# Patient Record
Sex: Female | Born: 1940 | Race: White | Hispanic: No | Smoking: Never smoker
Health system: Southern US, Community
[De-identification: ages and names within clinical notes are randomized; demographics above are authoritative.]

## PROBLEM LIST (undated history)

## (undated) ENCOUNTER — Emergency Department: Admission: EM | Payer: Medicare Other | Source: Home / Self Care

## (undated) DIAGNOSIS — M1990 Unspecified osteoarthritis, unspecified site: Secondary | ICD-10-CM

## (undated) DIAGNOSIS — R001 Bradycardia, unspecified: Secondary | ICD-10-CM

## (undated) DIAGNOSIS — I2510 Atherosclerotic heart disease of native coronary artery without angina pectoris: Secondary | ICD-10-CM

## (undated) DIAGNOSIS — Z972 Presence of dental prosthetic device (complete) (partial): Secondary | ICD-10-CM

## (undated) DIAGNOSIS — D649 Anemia, unspecified: Secondary | ICD-10-CM

## (undated) DIAGNOSIS — G20 Parkinson's disease: Secondary | ICD-10-CM

## (undated) DIAGNOSIS — N200 Calculus of kidney: Secondary | ICD-10-CM

## (undated) DIAGNOSIS — G20A1 Parkinson's disease without dyskinesia, without mention of fluctuations: Secondary | ICD-10-CM

## (undated) DIAGNOSIS — Z8719 Personal history of other diseases of the digestive system: Secondary | ICD-10-CM

## (undated) DIAGNOSIS — Z87442 Personal history of urinary calculi: Secondary | ICD-10-CM

## (undated) DIAGNOSIS — I700 Atherosclerosis of aorta: Secondary | ICD-10-CM

## (undated) DIAGNOSIS — I429 Cardiomyopathy, unspecified: Secondary | ICD-10-CM

## (undated) DIAGNOSIS — K5790 Diverticulosis of intestine, part unspecified, without perforation or abscess without bleeding: Secondary | ICD-10-CM

## (undated) DIAGNOSIS — K8020 Calculus of gallbladder without cholecystitis without obstruction: Secondary | ICD-10-CM

## (undated) DIAGNOSIS — K08109 Complete loss of teeth, unspecified cause, unspecified class: Secondary | ICD-10-CM

## (undated) DIAGNOSIS — Z889 Allergy status to unspecified drugs, medicaments and biological substances status: Secondary | ICD-10-CM

## (undated) DIAGNOSIS — I10 Essential (primary) hypertension: Secondary | ICD-10-CM

## (undated) DIAGNOSIS — Z9109 Other allergy status, other than to drugs and biological substances: Secondary | ICD-10-CM

## (undated) DIAGNOSIS — Z8739 Personal history of other diseases of the musculoskeletal system and connective tissue: Secondary | ICD-10-CM

## (undated) DIAGNOSIS — M40204 Unspecified kyphosis, thoracic region: Secondary | ICD-10-CM

## (undated) DIAGNOSIS — M5135 Other intervertebral disc degeneration, thoracolumbar region: Secondary | ICD-10-CM

## (undated) DIAGNOSIS — R7303 Prediabetes: Secondary | ICD-10-CM

## (undated) DIAGNOSIS — M810 Age-related osteoporosis without current pathological fracture: Secondary | ICD-10-CM

## (undated) DIAGNOSIS — I519 Heart disease, unspecified: Secondary | ICD-10-CM

## (undated) DIAGNOSIS — Z87898 Personal history of other specified conditions: Secondary | ICD-10-CM

## (undated) DIAGNOSIS — R0789 Other chest pain: Secondary | ICD-10-CM

## (undated) DIAGNOSIS — K219 Gastro-esophageal reflux disease without esophagitis: Secondary | ICD-10-CM

## (undated) DIAGNOSIS — I517 Cardiomegaly: Secondary | ICD-10-CM

## (undated) DIAGNOSIS — N182 Chronic kidney disease, stage 2 (mild): Secondary | ICD-10-CM

## (undated) DIAGNOSIS — I493 Ventricular premature depolarization: Secondary | ICD-10-CM

## (undated) DIAGNOSIS — I999 Unspecified disorder of circulatory system: Secondary | ICD-10-CM

## (undated) DIAGNOSIS — M419 Scoliosis, unspecified: Secondary | ICD-10-CM

## (undated) DIAGNOSIS — E785 Hyperlipidemia, unspecified: Secondary | ICD-10-CM

## (undated) DIAGNOSIS — R42 Dizziness and giddiness: Secondary | ICD-10-CM

## (undated) HISTORY — PX: TUBAL LIGATION: SHX77

## (undated) HISTORY — DX: Age-related osteoporosis without current pathological fracture: M81.0

## (undated) HISTORY — DX: Ventricular premature depolarization: I49.3

## (undated) HISTORY — DX: Hyperlipidemia, unspecified: E78.5

## (undated) HISTORY — DX: Cardiomyopathy, unspecified: I42.9

## (undated) HISTORY — PX: CORONARY ANGIOPLASTY WITH STENT PLACEMENT: SHX49

## (undated) HISTORY — DX: Personal history of other diseases of the musculoskeletal system and connective tissue: Z87.39

## (undated) HISTORY — DX: Atherosclerotic heart disease of native coronary artery without angina pectoris: I25.10

## (undated) HISTORY — DX: Parkinson's disease: G20

## (undated) HISTORY — DX: Other allergy status, other than to drugs and biological substances: Z91.09

## (undated) HISTORY — DX: Bradycardia, unspecified: R00.1

## (undated) HISTORY — PX: APPENDECTOMY: SHX54

## (undated) HISTORY — PX: VARICOSE VEIN SURGERY: SHX832

## (undated) HISTORY — DX: Calculus of kidney: N20.0

## (undated) HISTORY — DX: Other chest pain: R07.89

## (undated) HISTORY — PX: OTHER SURGICAL HISTORY: SHX169

## (undated) HISTORY — DX: Dizziness and giddiness: R42

---

## 1982-01-21 HISTORY — PX: BREAST EXCISIONAL BIOPSY: SUR124

## 1993-01-21 HISTORY — PX: LITHOTRIPSY: SUR834

## 1995-01-22 HISTORY — PX: BREAST EXCISIONAL BIOPSY: SUR124

## 2004-05-03 ENCOUNTER — Ambulatory Visit: Payer: Self-pay | Admitting: Internal Medicine

## 2005-06-04 ENCOUNTER — Ambulatory Visit: Payer: Self-pay | Admitting: Internal Medicine

## 2006-06-06 ENCOUNTER — Ambulatory Visit: Payer: Self-pay | Admitting: Internal Medicine

## 2006-07-08 ENCOUNTER — Ambulatory Visit: Payer: Self-pay | Admitting: Gastroenterology

## 2007-06-08 ENCOUNTER — Ambulatory Visit: Payer: Self-pay | Admitting: Internal Medicine

## 2008-02-29 ENCOUNTER — Encounter: Payer: Self-pay | Admitting: Physician Assistant

## 2008-03-21 ENCOUNTER — Encounter: Payer: Self-pay | Admitting: Physician Assistant

## 2008-06-13 ENCOUNTER — Ambulatory Visit: Payer: Self-pay | Admitting: Internal Medicine

## 2009-07-19 ENCOUNTER — Ambulatory Visit: Payer: Self-pay | Admitting: Internal Medicine

## 2010-08-03 ENCOUNTER — Ambulatory Visit: Payer: Self-pay | Admitting: Internal Medicine

## 2011-03-13 ENCOUNTER — Ambulatory Visit: Payer: Self-pay

## 2011-08-05 ENCOUNTER — Ambulatory Visit: Payer: Self-pay | Admitting: Internal Medicine

## 2011-08-14 ENCOUNTER — Ambulatory Visit: Payer: Self-pay | Admitting: Internal Medicine

## 2012-03-10 ENCOUNTER — Ambulatory Visit: Payer: Self-pay | Admitting: Cardiology

## 2012-03-10 DIAGNOSIS — I2510 Atherosclerotic heart disease of native coronary artery without angina pectoris: Secondary | ICD-10-CM

## 2012-03-10 HISTORY — PX: LEFT HEART CATH AND CORONARY ANGIOGRAPHY: CATH118249

## 2012-03-10 HISTORY — DX: Atherosclerotic heart disease of native coronary artery without angina pectoris: I25.10

## 2012-08-06 ENCOUNTER — Ambulatory Visit: Payer: Self-pay | Admitting: Internal Medicine

## 2013-06-21 DIAGNOSIS — I10 Essential (primary) hypertension: Secondary | ICD-10-CM | POA: Insufficient documentation

## 2013-06-21 DIAGNOSIS — E782 Mixed hyperlipidemia: Secondary | ICD-10-CM | POA: Insufficient documentation

## 2013-06-24 DIAGNOSIS — I2510 Atherosclerotic heart disease of native coronary artery without angina pectoris: Secondary | ICD-10-CM | POA: Insufficient documentation

## 2013-06-24 HISTORY — DX: Atherosclerotic heart disease of native coronary artery without angina pectoris: I25.10

## 2013-06-25 DIAGNOSIS — K219 Gastro-esophageal reflux disease without esophagitis: Secondary | ICD-10-CM | POA: Insufficient documentation

## 2013-06-25 HISTORY — PX: CORONARY ANGIOPLASTY WITH STENT PLACEMENT: SHX49

## 2013-06-25 HISTORY — DX: Gastro-esophageal reflux disease without esophagitis: K21.9

## 2013-08-02 DIAGNOSIS — N182 Chronic kidney disease, stage 2 (mild): Secondary | ICD-10-CM | POA: Insufficient documentation

## 2013-08-16 ENCOUNTER — Ambulatory Visit: Payer: Self-pay | Admitting: Internal Medicine

## 2013-08-31 ENCOUNTER — Encounter: Payer: Self-pay | Admitting: Cardiovascular Disease

## 2013-09-06 ENCOUNTER — Ambulatory Visit: Payer: Self-pay | Admitting: Internal Medicine

## 2013-09-21 ENCOUNTER — Encounter: Payer: Self-pay | Admitting: Cardiovascular Disease

## 2013-10-21 ENCOUNTER — Encounter: Payer: Self-pay | Admitting: Cardiovascular Disease

## 2014-08-16 ENCOUNTER — Ambulatory Visit
Admission: RE | Admit: 2014-08-16 | Discharge: 2014-08-16 | Disposition: A | Discharge status: Home / Self Care | Payer: Medicare Other | Source: Ambulatory Visit | Attending: Internal Medicine | Admitting: Internal Medicine

## 2014-08-16 ENCOUNTER — Other Ambulatory Visit: Payer: Self-pay | Admitting: Physician Assistant

## 2014-08-16 ENCOUNTER — Ambulatory Visit
Admission: EM | Admit: 2014-08-16 | Discharge: 2014-08-16 | Payer: Medicare Other | Attending: Internal Medicine | Admitting: Internal Medicine

## 2014-08-16 DIAGNOSIS — M79605 Pain in left leg: Secondary | ICD-10-CM | POA: Diagnosis not present

## 2014-08-16 DIAGNOSIS — M79662 Pain in left lower leg: Secondary | ICD-10-CM | POA: Diagnosis not present

## 2014-08-16 HISTORY — DX: Essential (primary) hypertension: I10

## 2014-08-16 HISTORY — DX: Heart disease, unspecified: I51.9

## 2014-08-16 HISTORY — DX: Unspecified disorder of circulatory system: I99.9

## 2014-08-16 NOTE — ED Notes (Signed)
Left leg since Thursday. Pt denies swelling. States the pain started in the calf, and radiates to the popliteal. Pt has tried Naproxen Sodium, without relief. Pt has a h/o vein surgeries in both legs.

## 2014-08-16 NOTE — ED Provider Notes (Signed)
CSN: [id number]     Arrival date & time 08/16/14  1843 History   First MD Initiated Contact with Patient 08/16/14 1947     Chief Complaint  Patient presents with  . Leg Pain   (Consider location/radiation/quality/duration/timing/severity/associated sxs/prior Treatment) HPI  74 yo F presents with her daughter concerned about  left calf tenderness increasing over the past 5 days . Patient  is normally quite active- does exercise class 2 times a week and often walk 2 miles a day on the in between days. Over the past 5 days she has had to interrupt her activities- abbreviated because of the discomfort. She has tried muscle rubs without relief. 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 She has been afebrile and without malaise. She takes Plavix and ASA daily Past Medical History  Diagnosis Date  . Hypertension   . Heart disease   . Vascular disease   . Coronary artery disease    Past Surgical History  Procedure Laterality Date  . Appendectomy    . Varicose vein surgery    . Breast lumpectomy  [id number]   History reviewed. No pertinent family history. History  Substance Use Topics  . Smoking status: Never Smoker   . Smokeless tobacco: Never Used  . Alcohol Use: No   OB History    No data available     Review of Systems Constitutional -afebrile Eyes-denies visual changes ENT- normal voice,denies sore throat CV-denies chest pain Resp-denies SOB GI- negative for nausea,vomiting, diarrhea GU- negative for dysuria MSK- negative for back pain, ambulatory with left calf discomfort recently Skin- denies acute changes, no erythema , heat  Neuro- negative headache,focal weakness or numbness   Allergies  Penicillins and Sulfa antibiotics  Home Medications   Prior to Admission medications   Medication Sig Start Date End Date Taking? Authorizing Provider  aspirin 81 MG tablet Take 81 mg by mouth daily.   Yes Historical Provider, MD  atorvastatin  (LIPITOR) 10 MG tablet Take 10 mg by mouth daily.   Yes Historical Provider, MD  CALCIUM CARBONATE-VITAMIN D PO Take by mouth.   Yes Historical Provider, MD  carvedilol (COREG) 6.25 MG tablet Take 6.25 mg by mouth 2 (two) times daily with a meal.   Yes Historical Provider, MD  clopidogrel (PLAVIX) 75 MG tablet Take 75 mg by mouth daily.   Yes Historical Provider, MD  fluticasone (FLONASE) 50 MCG/ACT nasal spray Place into both nostrils daily.   Yes Historical Provider, MD  Multiple Minerals (CALCIUM/MAGNESIUM/ZINC PO) Take by mouth.   Yes Historical Provider, MD  Multiple Vitamins-Minerals (MULTIVITAMIN ADULT) TABS Take by mouth.   Yes Historical Provider, MD  omega-3 acid ethyl esters (LOVAZA) 1 G capsule Take by mouth 2 (two) times daily.   Yes Historical Provider, MD  omeprazole (PRILOSEC) 20 MG capsule Take 20 mg by mouth daily.   Yes Historical Provider, MD  sertraline (ZOLOFT) 25 MG tablet Take 25 mg by mouth daily.   Yes Historical Provider, MD   BP 186/69 mmHg  Pulse 60  Temp(Src) 98.1 F (36.7 C) (Oral)  Resp 16  Ht 4\' 11"  (1.499 m)  Wt 152 lb (68.947 kg)  BMI 30.68 kg/m2  SpO2 97% Physical Exam   Constitutional -alert and oriented,well appearing and in no acute distress; expresses concern about leg Head-atraumatic, normocephalic Eyes- conjunctiva normal, EOMI ,conjugate gaze Nose- no congestion  Mouth/throat- mucous membranes moist , Neck- supple CV- regular rate, grossly normal heart sounds,  Resp-no distress, normal  respiratory effort,clear to auscultation bilaterally GI/GU- not examined MSK- Both extremities are warm, strong dorsal pedal pulses and equal 2 + DTRS; mild tenderness left calf with point tenderness posterior, squeeze positive for discomfort but not intense pain; left calf measures 15 1/2 at 5 " below prominence, Right calf measures larger at 16".  normal ROM, ambulatory, though presents in wheelchair for convenience of daughter while at [facility]. Accomplishes full  self care at home. Neuro- normal speech and language, no gross focal neurological deficit appreciated, no gait instability, Skin-warm,dry ,intact; no rash noted Psych-mood and affect grossly normal; speech and behavior grossly normal ED Course  Procedures (including critical care time) Labs Review Labs Reviewed - No data to display  Imaging Review US Venous Img Lower Unilateral Left  08/16/2014   CLINICAL DATA:  Left leg pain.  Progressive left calf tenderness.  EXAM: LEFT LOWER EXTREMITY VENOUS DOPPLER ULTRASOUND  TECHNIQUE: Gray-scale sonography with graded compression, as well as color Doppler and duplex ultrasound were performed to evaluate the lower extremity deep venous systems from the level of the common femoral vein and including the common femoral, femoral, profunda femoral, popliteal and calf veins including the posterior tibial, peroneal and gastrocnemius veins when visible. The superficial great saphenous vein was also interrogated. Spectral Doppler was utilized to evaluate flow at rest and with distal augmentation maneuvers in the common femoral, femoral and popliteal veins.  COMPARISON:  None.  FINDINGS: Contralateral Common Femoral Vein: Respiratory phasicity is normal and symmetric with the symptomatic side. No evidence of thrombus. Normal compressibility.  Common Femoral Vein: No evidence of thrombus. Normal compressibility, respiratory phasicity and response to augmentation.  Saphenofemoral Junction: No evidence of thrombus. Normal compressibility and flow on color Doppler imaging.  Profunda Femoral Vein: No evidence of thrombus. Normal compressibility and flow on color Doppler imaging.  Femoral Vein: No evidence of thrombus. Normal compressibility, respiratory phasicity and response to augmentation.  Popliteal Vein: No evidence of thrombus. Normal compressibility, respiratory phasicity and response to augmentation.  Calf Veins: No evidence of thrombus. Normal compressibility and flow  on color Doppler imaging.  Superficial Great Saphenous Vein: No evidence of thrombus. Normal compressibility and flow on color Doppler imaging.  Venous Reflux:  None.  Other Findings:  None.  IMPRESSION: No evidence of deep venous thrombosis.   Electronically Signed   By: Rubye Oaks M.D.   On: 08/16/2014 23:56   Discussed exam with patient and daughter. Consider muscle pain, claudication, DVT as possibilities - recommend r/o DVT and they wish to proceed. Sent to [facility] for Doppler u/s of left calf r/o DVT- reported as negative for DVT  MDM   1. Left leg pain    .Plan: 1. Plan of care pending Test/x-ray results and diagnosis reviewed with patient and daughter.- heat pad or ice as preferred.  2.Continue medications as previously ordered. Plan f/u with PCP on Friday as scheduled 3. Recommend supportive treatment with arthritis rubs if desired 4. F/u prn if symptoms worsen or don't improve  Rae Halsted, PA-C 08/17/14 2106

## 2014-08-17 ENCOUNTER — Other Ambulatory Visit: Payer: Medicare Other

## 2014-08-17 ENCOUNTER — Encounter: Payer: Self-pay | Admitting: Physician Assistant

## 2014-08-25 ENCOUNTER — Ambulatory Visit
Admission: EM | Admit: 2014-08-25 | Discharge: 2014-08-25 | Disposition: A | Discharge status: Home / Self Care | Payer: Medicare Other | Attending: Internal Medicine | Admitting: Internal Medicine

## 2014-08-25 ENCOUNTER — Ambulatory Visit: Payer: Medicare Other

## 2014-08-25 ENCOUNTER — Encounter: Payer: Self-pay | Admitting: Emergency Medicine

## 2014-08-25 DIAGNOSIS — Z7982 Long term (current) use of aspirin: Secondary | ICD-10-CM | POA: Insufficient documentation

## 2014-08-25 DIAGNOSIS — I519 Heart disease, unspecified: Secondary | ICD-10-CM | POA: Diagnosis not present

## 2014-08-25 DIAGNOSIS — I2510 Atherosclerotic heart disease of native coronary artery without angina pectoris: Secondary | ICD-10-CM | POA: Diagnosis not present

## 2014-08-25 DIAGNOSIS — M1712 Unilateral primary osteoarthritis, left knee: Secondary | ICD-10-CM | POA: Insufficient documentation

## 2014-08-25 DIAGNOSIS — Z79899 Other long term (current) drug therapy: Secondary | ICD-10-CM | POA: Insufficient documentation

## 2014-08-25 DIAGNOSIS — M25562 Pain in left knee: Secondary | ICD-10-CM | POA: Diagnosis present

## 2014-08-25 DIAGNOSIS — I10 Essential (primary) hypertension: Secondary | ICD-10-CM | POA: Diagnosis not present

## 2014-08-25 DIAGNOSIS — M25462 Effusion, left knee: Secondary | ICD-10-CM | POA: Insufficient documentation

## 2014-08-25 MED ORDER — MELOXICAM 7.5 MG PO TABS: 7.5000 mg | ORAL_TABLET | Freq: Every day | ORAL | Status: DC

## 2014-08-25 NOTE — Discharge Instructions (Signed)

## 2014-08-25 NOTE — ED Notes (Signed)
Patient was seen here last week for leg pain, today she states "  It really isn't any better and seems to be swelling."

## 2014-08-25 NOTE — ED Provider Notes (Signed)
CSN: [id number]     Arrival date & time 08/25/14  1502 History   First MD Initiated Contact with Patient 08/25/14 1559     Chief Complaint  Patient presents with  . Leg Pain   (Consider location/radiation/quality/duration/timing/severity/associated sxs/prior Treatment) HPI  74 year old female presents with recurrent left knee pain. She was seen here one week ago and sent to ultrasound to rule out a DVT which was done. She says that since that time it is not improved and in fact seems to be swelling more. Her pain is mostly localized to her knee but will feel it into her into her lower extremity is no pain in her hip. He is limping because the pain. It is not bothering her at nighttime when she is off. Pain is worse with ambulation or weightbearing. She saw her primary care physician on Friday and was not bothering her that much at the time and he thought that it may be from arthritis. She doesn't report with orthopedic surgeon in 2 weeks but because of her continued discomfort she came here today. It is causing her to limp with ambulation. She feels most her pain along the lateral aspect indicates near the lateral joint line and in the popliteal area.  Past Medical History  Diagnosis Date  . Hypertension   . Heart disease   . Vascular disease   . Coronary artery disease    Past Surgical History  Procedure Laterality Date  . Appendectomy    . Varicose vein surgery    . Breast lumpectomy  [id number]   History reviewed. No pertinent family history. History  Substance Use Topics  . Smoking status: Never Smoker   . Smokeless tobacco: Never Used  . Alcohol Use: No   OB History    No data available     Review of Systems  Musculoskeletal: Positive for joint swelling and arthralgias.  All other systems reviewed and are negative.   Allergies  Penicillins and Sulfa antibiotics  Home Medications   Prior to Admission medications   Medication Sig Start Date End Date Taking? Authorizing  Provider  aspirin 81 MG tablet Take 81 mg by mouth daily.    Historical Provider, MD  atorvastatin (LIPITOR) 10 MG tablet Take 10 mg by mouth daily.    Historical Provider, MD  CALCIUM CARBONATE-VITAMIN D PO Take by mouth.    Historical Provider, MD  carvedilol (COREG) 6.25 MG tablet Take 6.25 mg by mouth 2 (two) times daily with a meal.    Historical Provider, MD  clopidogrel (PLAVIX) 75 MG tablet Take 75 mg by mouth daily.    Historical Provider, MD  fluticasone (FLONASE) 50 MCG/ACT nasal spray Place into both nostrils daily.    Historical Provider, MD  meloxicam (MOBIC) 7.5 MG tablet Take 1 tablet (7.5 mg total) by mouth daily. 08/25/14   Lutricia Feil, PA-C  Multiple Minerals (CALCIUM/MAGNESIUM/ZINC PO) Take by mouth.    Historical Provider, MD  Multiple Vitamins-Minerals (MULTIVITAMIN ADULT) TABS Take by mouth.    Historical Provider, MD  omega-3 acid ethyl esters (LOVAZA) 1 G capsule Take by mouth 2 (two) times daily.    Historical Provider, MD  omeprazole (PRILOSEC) 20 MG capsule Take 20 mg by mouth daily.    Historical Provider, MD  sertraline (ZOLOFT) 25 MG tablet Take 25 mg by mouth daily.    Historical Provider, MD   BP 167/66 mmHg  Pulse 58  Temp(Src) 99.3 F (37.4 C) (Tympanic)  Resp 16  Ht  (  1.499 m)  Wt 154 lb (69.854 kg)  BMI 31.09 kg/m2  SpO2 97% Physical Exam  Constitutional: She is oriented to person, place, and time. She appears well-developed and well-nourished.  HENT:  Head: Normocephalic and atraumatic.  Musculoskeletal:  Examination of the left knee shows a 1+ effusion present. She has mildly decreased range of motion particularly with flexion causing her pain. Valgus stress causes her to have increased pain along the joint line there is stress is not as painful. The medial and lateral collateral ligaments are intact to stressing as is the anterior and posterior cruciates. Most pain seems to be along the lateral joint line mostly posteriorly. She does have  some crepitus with patellofemoral motion and mild retropatellar tenderness. Hip range of motion is full and comfortable. There is no pain with internal/external rotation.  Neurological: She is alert and oriented to person, place, and time.  Skin: Skin is warm and dry.  Psychiatric: She has a normal mood and affect. Her behavior is normal. Judgment and thought content normal.  Nursing note and vitals reviewed.   ED Course  Procedures (including critical care time) Labs Review Labs Reviewed - No data to display  Imaging Review Dg Knee Complete 4 Views Left  08/25/2014   CLINICAL DATA:  Left knee pain for 1 week  EXAM: LEFT KNEE - COMPLETE 4+ VIEW  COMPARISON:  None.  FINDINGS: There is some loss of medial joint space, with mild degenerative spurring from the patellofemoral space as well. The lateral joint space is relatively well preserved. No fracture is seen. However, there does appear to be a moderate-sized left knee joint effusion present. The patella is normally positioned.  IMPRESSION: 1. Moderate size left knee joint effusion. 2. Mild bicompartmental degenerative joint disease.   Electronically Signed   By: Dwyane Dee M.D.   On: 08/25/2014 16:55     MDM   1. Effusion of knee, left   2. Primary osteoarthritis of left knee    New Prescriptions   MELOXICAM (MOBIC) 7.5 MG TABLET    Take 1 tablet (7.5 mg total) by mouth daily.   Plan: 1. Diagnosis reviewed with patient 2. rx as per orders; risks, benefits, potential side effects reviewed with patient 3. Recommend supportive treatment with walker. Stop treadmill. Stop ibuprofen 4. F/u prn if symptoms worsen or don't improve Reviewed the x-rays with Mrs. Sabina. I recommended she consider using a walker or a cane to help protect her left knee. She should certainly stop using the treadmill and other exercises with seems to exacerbate her symptoms. She states she has an appointment with an orthopedist in 2 weeks I have encouraged her to keep  that. I've given her a copy of her x-rays here so he can review those  at her appointment. Also asked her to consult her cardiologist in use of the Modic in conjunction with her Plavix and other medications. She'll do this prior to taking the Modic.   Lutricia Feil, PA-C 08/25/14 1734

## 2014-08-25 NOTE — ED Notes (Signed)
Patient was here last week with leg pain. Today she states it is "swollen and her foot seems swollen and the pain is not getting better"

## 2014-09-01 ENCOUNTER — Other Ambulatory Visit: Payer: Self-pay | Admitting: Internal Medicine

## 2014-09-01 DIAGNOSIS — Z1231 Encounter for screening mammogram for malignant neoplasm of breast: Secondary | ICD-10-CM

## 2014-09-12 ENCOUNTER — Ambulatory Visit
Admission: RE | Admit: 2014-09-12 | Discharge: 2014-09-12 | Disposition: A | Discharge status: Home / Self Care | Payer: Medicare Other | Source: Ambulatory Visit | Attending: Internal Medicine | Admitting: Internal Medicine

## 2014-09-12 ENCOUNTER — Other Ambulatory Visit: Payer: Self-pay | Admitting: Internal Medicine

## 2014-09-12 DIAGNOSIS — Z1231 Encounter for screening mammogram for malignant neoplasm of breast: Secondary | ICD-10-CM | POA: Insufficient documentation

## 2014-10-03 ENCOUNTER — Other Ambulatory Visit: Payer: Self-pay | Admitting: Physician Assistant

## 2014-10-03 DIAGNOSIS — M2392 Unspecified internal derangement of left knee: Secondary | ICD-10-CM

## 2014-10-03 DIAGNOSIS — S83207S Unspecified tear of unspecified meniscus, current injury, left knee, sequela: Secondary | ICD-10-CM

## 2014-10-10 ENCOUNTER — Ambulatory Visit
Admission: RE | Admit: 2014-10-10 | Discharge: 2014-10-10 | Disposition: A | Discharge status: Home / Self Care | Payer: Medicare Other | Source: Ambulatory Visit | Attending: Physician Assistant | Admitting: Physician Assistant

## 2014-10-10 DIAGNOSIS — M7122 Synovial cyst of popliteal space [Baker], left knee: Secondary | ICD-10-CM | POA: Insufficient documentation

## 2014-10-10 DIAGNOSIS — X58XXXA Exposure to other specified factors, initial encounter: Secondary | ICD-10-CM | POA: Insufficient documentation

## 2014-10-10 DIAGNOSIS — M25462 Effusion, left knee: Secondary | ICD-10-CM | POA: Diagnosis not present

## 2014-10-10 DIAGNOSIS — S83207S Unspecified tear of unspecified meniscus, current injury, left knee, sequela: Secondary | ICD-10-CM

## 2014-10-10 DIAGNOSIS — S83242A Other tear of medial meniscus, current injury, left knee, initial encounter: Secondary | ICD-10-CM | POA: Insufficient documentation

## 2014-10-10 DIAGNOSIS — M2392 Unspecified internal derangement of left knee: Secondary | ICD-10-CM | POA: Insufficient documentation

## 2014-10-10 DIAGNOSIS — M7050 Other bursitis of knee, unspecified knee: Secondary | ICD-10-CM | POA: Insufficient documentation

## 2015-01-24 ENCOUNTER — Encounter
Admission: RE | Admit: 2015-01-24 | Discharge: 2015-01-24 | Disposition: A | Discharge status: Home / Self Care | Payer: Medicare Other | Source: Ambulatory Visit | Attending: Orthopedic Surgery | Admitting: Orthopedic Surgery

## 2015-01-24 DIAGNOSIS — Z01812 Encounter for preprocedural laboratory examination: Secondary | ICD-10-CM | POA: Insufficient documentation

## 2015-01-24 HISTORY — DX: Gastro-esophageal reflux disease without esophagitis: K21.9

## 2015-01-24 LAB — BASIC METABOLIC PANEL
ANION GAP: 4 — AB (ref 5–15)
BUN: 24 mg/dL — ABNORMAL HIGH (ref 6–20)
CHLORIDE: 106 mmol/L (ref 101–111)
CO2: 29 mmol/L (ref 22–32)
Calcium: 9.6 mg/dL (ref 8.9–10.3)
Creatinine, Ser: 0.84 mg/dL (ref 0.44–1.00)
GFR calc non Af Amer: >60 mL/min (ref >60)
GLUCOSE: 103 mg/dL — AB (ref 65–99)
POTASSIUM: 4.5 mmol/L (ref 3.5–5.1)
Sodium: 139 mmol/L (ref 135–145)

## 2015-01-24 LAB — CBC
HCT: 35.8 % (ref 35.0–47.0)
HEMOGLOBIN: 11.9 g/dL — AB (ref 12.0–16.0)
MCH: 28.7 pg (ref 26.0–34.0)
MCHC: 33.2 g/dL (ref 32.0–36.0)
MCV: 86.3 fL (ref 80.0–100.0)
Platelets: 186 10*3/uL (ref 150–440)
RBC: 4.15 MIL/uL (ref 3.80–5.20)
RDW: 14.5 % (ref 11.5–14.5)
WBC: 4.8 10*3/uL (ref 3.6–11.0)

## 2015-01-24 LAB — PROTIME-INR
INR: 0.95
Prothrombin Time: 12.9 seconds (ref 11.4–15.0)

## 2015-01-24 LAB — APTT: APTT: 35 s (ref 24–36)

## 2015-01-24 NOTE — Patient Instructions (Signed)
  Your procedure is scheduled on: Wednesday 02/01/2015 Report to Day Surgery. 2ND FLOOR MEDICAL MALL ENTRANCE To find out your arrival time please call [phone] between 1PM - 3PM on Tuesday 01/31/2015.  Remember: Instructions that are not followed completely may result in serious medical risk, up to and including death, or upon the discretion of your surgeon and anesthesiologist your surgery may need to be rescheduled.    __X__ 1. Do not eat food or drink liquids after midnight. No gum chewing or hard candies.     __X__ 2. No Alcohol for 24 hours before or after surgery.   ____ 3. Bring all medications with you on the day of surgery if instructed.    __X__ 4. Notify your doctor if there is any change in your medical condition     (cold, fever, infections).     Do not wear jewelry, make-up, hairpins, clips or nail polish.  Do not wear lotions, powders, or perfumes.   Do not shave 48 hours prior to surgery. Men may shave face and neck.  Do not bring valuables to the hospital.    [facility] is not responsible for any belongings or valuables.               Contacts, dentures or bridgework may not be worn into surgery.  Leave your suitcase in the car. After surgery it may be brought to your room.  For patients admitted to the hospital, discharge time is determined by your                treatment team.   Patients discharged the day of surgery will not be allowed to drive home.   Please read over the following fact sheets that you were given:   Surgical Site Infection Prevention   __X__ Take these medicines the morning of surgery with A SIP OF WATER:    1. atorvastatin (LIPITOR) 40 MG tablet  2. carvedilol (COREG) 6.25 MG tablet  3. omeprazole (PRILOSEC) 20 MG cap  4.  5.  6.  ____ Fleet Enema (as directed)   __X_ Use CHG Soap as directed  ____ Use inhalers on the day of surgery  ____ Stop metformin 2 days prior to surgery    ____ Take 1/2 of usual insulin dose the night  before surgery and none on the morning of surgery.   __X__ Stop Coumadin/Plavix/aspirin on CONTACT YOUR CARDIOLOGIST REGARDING ABILITY TO STOP YOUR PLAVIX PRIOR TO SURGERY  ____ Stop Anti-inflammatories on    __X__ Stop supplements until after surgery.  VITAMIN C, OMEGA, SUPER B ____ Bring C-Pap to the hospital.

## 2015-01-26 ENCOUNTER — Ambulatory Visit (INDEPENDENT_AMBULATORY_CARE_PROVIDER_SITE_OTHER)
Admission: EM | Admit: 2015-01-26 | Discharge: 2015-01-26 | Disposition: A | Discharge status: Other Acute Inpatient Hospital | Payer: Medicare Other | Source: Home / Self Care | Attending: Family Medicine | Admitting: Family Medicine

## 2015-01-26 ENCOUNTER — Encounter: Payer: Self-pay | Admitting: *Deleted

## 2015-01-26 ENCOUNTER — Emergency Department
Admission: EM | Admit: 2015-01-26 | Discharge: 2015-01-26 | Disposition: A | Discharge status: Home / Self Care | Payer: Medicare Other | Attending: Emergency Medicine | Admitting: Emergency Medicine

## 2015-01-26 ENCOUNTER — Emergency Department: Payer: Medicare Other

## 2015-01-26 ENCOUNTER — Encounter: Payer: Self-pay | Admitting: Emergency Medicine

## 2015-01-26 DIAGNOSIS — R404 Transient alteration of awareness: Secondary | ICD-10-CM

## 2015-01-26 DIAGNOSIS — S022XXB Fracture of nasal bones, initial encounter for open fracture: Secondary | ICD-10-CM | POA: Insufficient documentation

## 2015-01-26 DIAGNOSIS — Z7982 Long term (current) use of aspirin: Secondary | ICD-10-CM | POA: Insufficient documentation

## 2015-01-26 DIAGNOSIS — Y92009 Unspecified place in unspecified non-institutional (private) residence as the place of occurrence of the external cause: Secondary | ICD-10-CM | POA: Insufficient documentation

## 2015-01-26 DIAGNOSIS — Y998 Other external cause status: Secondary | ICD-10-CM | POA: Diagnosis not present

## 2015-01-26 DIAGNOSIS — Y9389 Activity, other specified: Secondary | ICD-10-CM | POA: Insufficient documentation

## 2015-01-26 DIAGNOSIS — T07XXXA Unspecified multiple injuries, initial encounter: Secondary | ICD-10-CM

## 2015-01-26 DIAGNOSIS — S0121XA Laceration without foreign body of nose, initial encounter: Secondary | ICD-10-CM | POA: Diagnosis present

## 2015-01-26 DIAGNOSIS — Z79899 Other long term (current) drug therapy: Secondary | ICD-10-CM | POA: Insufficient documentation

## 2015-01-26 DIAGNOSIS — Z7901 Long term (current) use of anticoagulants: Secondary | ICD-10-CM

## 2015-01-26 DIAGNOSIS — R52 Pain, unspecified: Secondary | ICD-10-CM

## 2015-01-26 DIAGNOSIS — S0181XA Laceration without foreign body of other part of head, initial encounter: Secondary | ICD-10-CM | POA: Diagnosis not present

## 2015-01-26 DIAGNOSIS — Z880 Allergy status to penicillin: Secondary | ICD-10-CM | POA: Insufficient documentation

## 2015-01-26 DIAGNOSIS — Z7902 Long term (current) use of antithrombotics/antiplatelets: Secondary | ICD-10-CM | POA: Diagnosis not present

## 2015-01-26 DIAGNOSIS — S8012XA Contusion of left lower leg, initial encounter: Secondary | ICD-10-CM | POA: Diagnosis not present

## 2015-01-26 DIAGNOSIS — Z9229 Personal history of other drug therapy: Secondary | ICD-10-CM

## 2015-01-26 DIAGNOSIS — R4020 Unspecified coma: Secondary | ICD-10-CM

## 2015-01-26 DIAGNOSIS — W01198A Fall on same level from slipping, tripping and stumbling with subsequent striking against other object, initial encounter: Secondary | ICD-10-CM | POA: Diagnosis not present

## 2015-01-26 DIAGNOSIS — T148 Other injury of unspecified body region: Secondary | ICD-10-CM | POA: Diagnosis not present

## 2015-01-26 DIAGNOSIS — I10 Essential (primary) hypertension: Secondary | ICD-10-CM | POA: Insufficient documentation

## 2015-01-26 DIAGNOSIS — R220 Localized swelling, mass and lump, head: Secondary | ICD-10-CM

## 2015-01-26 MED ORDER — TETANUS-DIPHTH-ACELL PERTUSSIS 5-2.5-18.5 LF-MCG/0.5 IM SUSP
0.5000 mL | Freq: Once | INTRAMUSCULAR | Status: AC
  Administered 2015-01-26: 0.5 mL via INTRAMUSCULAR

## 2015-01-26 MED ORDER — LIDOCAINE HCL (PF) 1 % IJ SOLN
5.0000 mL | Freq: Once | INTRAMUSCULAR | Status: DC
  Filled 2015-01-26: qty 5

## 2015-01-26 MED ORDER — HYDROCODONE-ACETAMINOPHEN 5-325 MG PO TABS: ORAL_TABLET | ORAL | Status: DC

## 2015-01-26 MED ORDER — LIDOCAINE-EPINEPHRINE-TETRACAINE (LET) SOLUTION
3.0000 mL | Freq: Once | NASAL | Status: AC
  Administered 2015-01-26: 3 mL via TOPICAL
  Filled 2015-01-26: qty 3

## 2015-01-26 MED ORDER — TETANUS-DIPHTH-ACELL PERTUSSIS 5-2.5-18.5 LF-MCG/0.5 IM SUSP: 0.5000 mL | Freq: Once | INTRAMUSCULAR | Status: DC

## 2015-01-26 NOTE — ED Notes (Addendum)
Pt fell sustained laceration to nose, pt sent from urgent care after having a tetanus, pt takes Plavix, last dose on 01/24/15 due to an upcoming Knee surgery, laceration is not bleeding in triage, pt denies hitting head or LOC

## 2015-01-26 NOTE — Discharge Instructions (Signed)
Facial Laceration °A facial laceration is a cut on the face. These injuries can be painful and cause bleeding. Some cuts may need to be closed with stitches (sutures), skin adhesive strips, or wound glue. Cuts usually heal quickly but can leave a scar. It can take 1-2 years for the scar to go away completely. °HOME CARE  °· Only take medicines as told by your doctor. °· Follow your doctor's instructions for wound care. °For Stitches: °· Keep the cut clean and dry. °· If you have a bandage (dressing), change it at least once a day. Change the bandage if it gets wet or dirty, or as told by your doctor. °· Wash the cut with soap and water 2 times a day. Rinse the cut with water. Pat it dry with a clean towel. °· Put a thin layer of medicated cream on the cut as told by your doctor. °· You may shower after the first 24 hours. Do not soak the cut in water until the stitches are removed. °· Have your stitches removed as told by your doctor. °· Do not wear any makeup until a few days after your stitches are removed. °For Skin Adhesive Strips: °· Keep the cut clean and dry. °· Do not get the strips wet. You may take a bath, but be careful to keep the cut dry. °· If the cut gets wet, pat it dry with a clean towel. °· The strips will fall off on their own. Do not remove the strips that are still stuck to the cut. °For Wound Glue: °· You may shower or take baths. Do not soak or scrub the cut. Do not swim. Avoid heavy sweating until the glue falls off on its own. After a shower or bath, pat the cut dry with a clean towel. °· Do not put medicine or makeup on your cut until the glue falls off. °· If you have a bandage, do not put tape over the glue. °· Avoid lots of sunlight or tanning lamps until the glue falls off. °· The glue will fall off on its own in 5-10 days. Do not pick at the glue. °After Healing: °· Put sunscreen on the cut for the first year to reduce your scar. °GET HELP IF: °· You have a fever. °GET HELP RIGHT AWAY  IF:  °· Your cut area gets red, painful, or puffy (swollen). °· You see a yellowish-white fluid (pus) coming from the cut. °  °This information is not intended to replace advice given to you by your health care provider. Make sure you discuss any questions you have with your health care provider. °  °Document Released: 06/26/2007 Document Revised: 01/28/2014 Document Reviewed: 08/20/2012 °Elsevier Interactive Patient Education ©2016 [employer] ° ° °Laceration Care, Adult °A laceration is a cut that goes through all layers of the skin. The cut also goes into the tissue that is right under the skin. Some cuts heal on their own. Others need to be closed with stitches (sutures), staples, skin adhesive strips, or wound glue. Taking care of your cut lowers your risk of infection and helps your cut to heal better. °HOW TO TAKE CARE OF YOUR CUT °For stitches or staples: °· Keep the wound clean and dry. °· If you were given a bandage (dressing), you should change it at least one time per day or as told by your doctor. You should also change it if it gets wet or dirty. °· Keep the wound completely dry for the first   24 hours or as told by your doctor. After that time, you may take a shower or a bath. However, make sure that the wound is not soaked in water until after the stitches or staples have been removed. °· Clean the wound one time each day or as told by your doctor: °¨ Wash the wound with soap and water. °¨ Rinse the wound with water until all of the soap comes off. °¨ Pat the wound dry with a clean towel. Do not rub the wound. °· After you clean the wound, put a thin layer of antibiotic ointment on it as told by your doctor. This ointment: °¨ Helps to prevent infection. °¨ Keeps the bandage from sticking to the wound. °· Have your stitches or staples removed as told by your doctor. °If your doctor used skin adhesive strips:  °· Keep the wound clean and dry. °· If you were given a bandage, you should change it at  least one time per day or as told by your doctor. You should also change it if it gets dirty or wet. °· Do not get the skin adhesive strips wet. You can take a shower or a bath, but be careful to keep the wound dry. °· If the wound gets wet, pat it dry with a clean towel. Do not rub the wound. °· Skin adhesive strips fall off on their own. You can trim the strips as the wound heals. Do not remove any strips that are still stuck to the wound. They will fall off after a while. °If your doctor used wound glue: °· Try to keep your wound dry, but you may briefly wet it in the shower or bath. Do not soak the wound in water, such as by swimming. °· After you take a shower or a bath, gently pat the wound dry with a clean towel. Do not rub the wound. °· Do not do any activities that will make you really sweaty until the skin glue has fallen off on its own. °· Do not apply liquid, cream, or ointment medicine to your wound while the skin glue is still on. °· If you were given a bandage, you should change it at least one time per day or as told by your doctor. You should also change it if it gets dirty or wet. °· If a bandage is placed over the wound, do not let the tape for the bandage touch the skin glue. °· Do not pick at the glue. The skin glue usually stays on for 5-10 days. Then, it falls off of the skin. °General Instructions  °· To help prevent scarring, make sure to cover your wound with sunscreen whenever you are outside after stitches are removed, after adhesive strips are removed, or when wound glue stays in place and the wound is healed. Make sure to wear a sunscreen of at least 30 SPF. °· Take over-the-counter and prescription medicines only as told by your doctor. °· If you were given antibiotic medicine or ointment, take or apply it as told by your doctor. Do not stop using the antibiotic even if your wound is getting better. °· Do not scratch or pick at the wound. °· Keep all follow-up visits as told by your  doctor. This is important. °· Check your wound every day for signs of infection. Watch for: °¨ Redness, swelling, or pain. °¨ Fluid, blood, or pus. °· Raise (elevate) the injured area above the level of your heart while you are sitting or lying   down, if possible. °GET HELP IF: °· You got a tetanus shot and you have any of these problems at the injection site: °¨ Swelling. °¨ Very bad pain. °¨ Redness. °¨ Bleeding. °· You have a fever. °· A wound that was closed breaks open. °· You notice a bad smell coming from your wound or your bandage. °· You notice something coming out of the wound, such as wood or glass. °· Medicine does not help your pain. °· You have more redness, swelling, or pain at the site of your wound. °· You have fluid, blood, or pus coming from your wound. °· You notice a change in the color of your skin near your wound. °· You need to change the bandage often because fluid, blood, or pus is coming from the wound. °· You start to have a new rash. °· You start to have numbness around the wound. °GET HELP RIGHT AWAY IF: °· You have very bad swelling around the wound. °· Your pain suddenly gets worse and is very bad. °· You notice painful lumps near the wound or on skin that is anywhere on your body. °· You have a red streak going away from your wound. °· The wound is on your hand or foot and you cannot move a finger or toe like you usually can. °· The wound is on your hand or foot and you notice that your fingers or toes look pale or bluish. °  °This information is not intended to replace advice given to you by your health care provider. Make sure you discuss any questions you have with your health care provider. °  °Document Released: 06/26/2007 Document Revised: 05/24/2014 Document Reviewed: 01/03/2014 °Elsevier Interactive Patient Education ©2016 [employer] ° °

## 2015-01-26 NOTE — ED Provider Notes (Signed)
[facility] Emergency Department Provider Note  ____________________________________________  Time seen: Approximately 4:38 PM  I have reviewed the triage vital signs and the nursing notes.   HISTORY  Chief Complaint Laceration  HPI Katelyn Crawford is a 75 y.o. female sent here from medical in urgent care with laceration to the left side of her nose. Patient states she was going in the door of her father's house and is not sure what caused her fall. She states that she remembers screaming when she hit and that her hands before and she was unable to break her fall. There is some question of whether or not there was loss of consciousness. Patient doesn't think she blacked out and has not done so in the past. She remembers laceration bleeding. She states that she is on Plavix and tends to bleed easily. Currently her only complaint is nasal pain along with her laceration. She does have a bruise to her left leg. She is concerned because she is having knee surgery next Wednesday.She rates her pain at present as a 6 out of 10.   Past Medical History  Diagnosis Date  . Hypertension   . Heart disease   . Vascular disease   . Coronary artery disease   . GERD (gastroesophageal reflux disease)     There are no active problems to display for this patient.   Past Surgical History  Procedure Laterality Date  . Appendectomy    . Varicose vein surgery    . Breast lumpectomy  [id number]  . Breast biopsy Left 1984    neg  . Coronary angioplasty with stent placement    . Tubal ligation      Current Outpatient Rx  Name  Route  Sig  Dispense  Refill  . aspirin 81 MG tablet   Oral   Take 81 mg by mouth daily.         Marland Kitchen atorvastatin (LIPITOR) 40 MG tablet   Oral   Take 40 mg by mouth daily.         . B Complex-C (SUPER B COMPLEX PO)   Oral   Take 1 capsule by mouth daily.         Marland Kitchen CALCIUM CARBONATE-VITAMIN D PO   Oral   Take by mouth.         . carvedilol  (COREG) 6.25 MG tablet   Oral   Take 6.25 mg by mouth 2 (two) times daily with a meal.         . cetirizine (ZYRTEC) 10 MG tablet   Oral   Take 10 mg by mouth at bedtime.         . Cholecalciferol (VITAMIN D-3 PO)   Oral   Take 1,000 Int'l Units by mouth daily.         . clopidogrel (PLAVIX) 75 MG tablet   Oral   Take 75 mg by mouth daily.         Marland Kitchen COENZYME Q10-OMEGA 3 FATTY ACD PO   Oral   Take 2 tablets by mouth daily.         Marland Kitchen HYDROcodone-acetaminophen (NORCO/VICODIN) 5-325 MG tablet      1 tablet q 4-6 hours prn pain   20 tablet   0   . Multiple Minerals (CALCIUM/MAGNESIUM/ZINC PO)   Oral   Take by mouth.         . Multiple Vitamins-Minerals (MULTIVITAMIN ADULT) TABS   Oral   Take by mouth.         Marland Kitchen  omeprazole (PRILOSEC) 20 MG capsule   Oral   Take 20 mg by mouth daily as needed.          . sertraline (ZOLOFT) 25 MG tablet   Oral   Take 25 mg by mouth at bedtime.          . vitamin C (ASCORBIC ACID) 500 MG tablet   Oral   Take 500 mg by mouth daily.           Allergies Penicillins and Sulfa antibiotics  No family history on file.  Social History Social History  Substance Use Topics  . Smoking status: Never Smoker   . Smokeless tobacco: Never Used  . Alcohol Use: No    Review of Systems Constitutional: No fever/chills Eyes: No visual changes. ENT: Trauma to nose. Cardiovascular: Denies chest pain. Respiratory: Denies shortness of breath. Gastrointestinal: No abdominal pain.  No nausea, no vomiting. Musculoskeletal: Negative for back pain. Left leg minimal pain. Skin: Positive laceration nose Neurological: Negative for headaches, focal weakness or numbness.  10-point ROS otherwise negative.  ____________________________________________   PHYSICAL EXAM:  VITAL SIGNS: ED Triage Vitals  Enc Vitals Group     BP 01/26/15 1324 157/84 mmHg     Pulse Rate 01/26/15 1324 59     Resp 01/26/15 1324 18     Temp 01/26/15  1324 97.7 F (36.5 C)     Temp Source 01/26/15 1324 Oral     SpO2 01/26/15 1324 98 %     Weight 01/26/15 1324 155 lb (70.308 kg)     Height 01/26/15 1324 4\' 11"  (1.499 m)     Head Cir --      Peak Flow --      Pain Score 01/26/15 1324 6     Pain Loc --      Pain Edu? --      Excl. in GC? --     Constitutional: Alert and oriented. Well appearing and in no acute distress. Eyes: Conjunctivae are normal. PERRL. EOMI. Head: Atraumatic. Nose: No congestion/rhinnorhea. Moderate edema present at the bridge of the nose with laceration to the left lateral aspect. No active bleeding at present and no foreign body is noted. Mouth/Throat: Mucous membranes are moist.  No dental injuries noted. Neck: No stridor.  No cervical tenderness on palpation posteriorly. Cardiovascular: Normal rate, regular rhythm. Grossly normal heart sounds.  Good peripheral circulation. Respiratory: Normal respiratory effort.  No retractions. Lungs CTAB. Gastrointestinal: Soft and nontender. No distention.  Musculoskeletal: Moves upper and lower extremities without any difficulty. There is a 1-1/2 cm ecchymotic area to the proximal aspect of the fibula without any marked tenderness on palpation. Patient is able to flex and extend her knee without difficulty and has been ambulatory since her accident. Neurologic:  Normal speech and language. No gross focal neurologic deficits are appreciated. No gait instability. Skin:  Skin is warm, dry and intact. No rash noted. Laceration as noted above. Psychiatric: Mood and affect are normal. Speech and behavior are normal.  ____________________________________________   LABS (all labs ordered are listed, but only abnormal results are displayed)  Labs Reviewed - No data to display  RADIOLOGY CT head per radiologist shows mild atrophy without acute intracranial process. 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 CT facial shows  acute minimally displaced bilateral nasal bone fracture without radiopaque foreign body. ____________________________________________   PROCEDURES  Procedure(s) performed: LACERATION REPAIR Performed by: Tommi Rumps Authorized by: Tommi Rumps  Consent: Verbal consent obtained. Risks and benefits: risks, benefits and alternatives were discussed Consent given by: patient Patient identity confirmed: provided demographic data Prepped and Draped in normal sterile fashion Wound explored  Laceration Location: Left lateral nose  Laceration Length: 2.0 cm  No Foreign Bodies seen or palpated  Anesthesia: local infiltration  Local anesthetic: lidocaine 1 % without epinephrine after let was applied for 15 minutes.   Anesthetic total: 2.0 ml  Irrigation method: syringe Amount of cleaning: standard  Skin closure: 6-0 Vicryl   Number of sutures: 7   Technique: Simple interrupted sutures   Patient tolerance: Patient tolerated the procedure well with no immediate complications.  Critical Care performed: No  ____________________________________________   INITIAL IMPRESSION / ASSESSMENT AND PLAN / ED COURSE  Pertinent labs & imaging results that were available during my care of the patient were reviewed by me and considered in my medical decision making (see chart for details).  Patient and family were made aware that patient does have a fractured nose but no other fractures were seen. Patient tolerated suturing extremely well and family was informed that there would be a lot of facial swelling around the nose and possibly bruising under the eyes. Patient was given a prescription for Norco if needed for severe pain but warned that this could increase her chances of following and risk of drowsiness. Patient will follow-up with Dr. Jenne Campus if any continued problems with her nose after the swelling goes down. Family is aware that if the sutures have not fallen out in 5 days they  may return to the emergency room for suture removal. ____________________________________________   FINAL CLINICAL IMPRESSION(S) / ED DIAGNOSES  Final diagnoses:  Pain  Nasal swelling  Laceration of nose without complication, initial encounter  Fracture, nasal bone, open, initial encounter      Tommi Rumps, PA-C 01/26/15 1654  Governor Rooks, MD 01/27/15 2242

## 2015-01-26 NOTE — ED Notes (Signed)
Pt reports fell while walking in door and hit nose on part of door. Pt with laceration to left side of nose and bruising to left knee. Pt reports stopped taking plavix Tuesday due to upcoming knee surgery next week.

## 2015-01-26 NOTE — ED Provider Notes (Signed)
CSN: [id number]     Arrival date & time 01/26/15  1126 History   First MD Initiated Contact with Patient 01/26/15 1202    Nurses notes were reviewed. Chief Complaint  Patient presents with  . Fall   Patient is here because of fall hitting her face. She states that a few hours ago she was taking to her elderly father when she slipped and basically hit the door jam. She thinks she passed out she is not sure she is just stopped her blood thinner is ago because of impending knee surgery she did injure the left knee is going to have knee surgery on she has a laceration over her left nostril she's not sure that she passed out but she states that was a bloody mess in the house after she fell.    (Consider location/radiation/quality/duration/timing/severity/associated sxs/prior Treatment) Patient is a 75 y.o. female presenting with fall and facial injury. The history is provided by the patient, a relative and a friend. No language interpreter was used.  Fall This is a new problem. The current episode started 3 to 5 hours ago. The problem has not changed since onset.Pertinent negatives include no chest pain, no abdominal pain, no headaches and no shortness of breath. Nothing aggravates the symptoms. Nothing relieves the symptoms. She has tried nothing for the symptoms.  Facial Injury Mechanism of injury:  Fall Location:  Face and nose Time since incident:  4 hours Pain details:    Quality:  Shooting and stabbing   Severity:  Moderate   Timing:  Constant Chronicity:  New Worsened by:  Nothing tried Ineffective treatments:  None tried Associated symptoms: loss of consciousness   Associated symptoms: no headaches     Past Medical History  Diagnosis Date  . Hypertension   . Heart disease   . Vascular disease   . Coronary artery disease   . GERD (gastroesophageal reflux disease)    Past Surgical History  Procedure Laterality Date  . Appendectomy    . Varicose vein surgery    . Breast  lumpectomy  [id number]  . Breast biopsy Left 1984    neg  . Coronary angioplasty with stent placement    . Tubal ligation     No family history on file. Social History  Substance Use Topics  . Smoking status: Never Smoker   . Smokeless tobacco: Never Used  . Alcohol Use: No   OB History    No data available     Review of Systems  Respiratory: Negative for shortness of breath.   Cardiovascular: Negative for chest pain.  Gastrointestinal: Negative for abdominal pain.  Neurological: Positive for loss of consciousness. Negative for headaches.    Allergies  Penicillins and Sulfa antibiotics  Home Medications   Prior to Admission medications   Medication Sig Start Date End Date Taking? Authorizing Provider  aspirin 81 MG tablet Take 81 mg by mouth daily.    Historical Provider, MD  atorvastatin (LIPITOR) 40 MG tablet Take 40 mg by mouth daily.    Historical Provider, MD  B Complex-C (SUPER B COMPLEX PO) Take 1 capsule by mouth daily.    Historical Provider, MD  CALCIUM CARBONATE-VITAMIN D PO Take by mouth.    Historical Provider, MD  carvedilol (COREG) 6.25 MG tablet Take 6.25 mg by mouth 2 (two) times daily with a meal.    Historical Provider, MD  cetirizine (ZYRTEC) 10 MG tablet Take 10 mg by mouth at bedtime.    Historical Provider, MD  Cholecalciferol (VITAMIN D-3 PO) Take 1,000 Int'l Units by mouth daily.    Historical Provider, MD  clopidogrel (PLAVIX) 75 MG tablet Take 75 mg by mouth daily.    Historical Provider, MD  COENZYME Q10-OMEGA 3 FATTY ACD PO Take 2 tablets by mouth daily.    Historical Provider, MD  Multiple Minerals (CALCIUM/MAGNESIUM/ZINC PO) Take by mouth.    Historical Provider, MD  Multiple Vitamins-Minerals (MULTIVITAMIN ADULT) TABS Take by mouth.    Historical Provider, MD  omeprazole (PRILOSEC) 20 MG capsule Take 20 mg by mouth daily as needed.     Historical Provider, MD  sertraline (ZOLOFT) 25 MG tablet Take 25 mg by mouth at bedtime.     Historical  Provider, MD  vitamin C (ASCORBIC ACID) 500 MG tablet Take 500 mg by mouth daily.    Historical Provider, MD   Meds Ordered and Administered this Visit   Medications  Tdap (BOOSTRIX) injection 0.5 mL (0.5 mLs Intramuscular Given 01/26/15 1254)    BP 182/73 mmHg  Pulse 58  Temp(Src) 97.6 F (36.4 C) (Tympanic)  Resp 16  Ht 4\' 11"  (1.499 m)  Wt 155 lb (70.308 kg)  BMI 31.29 kg/m2  SpO2 96% No data found.   Physical Exam  Constitutional: She is oriented to person, place, and time. She appears well-developed and well-nourished.  HENT:  Head: Normocephalic. Head is with contusion, with laceration and with left periorbital erythema.    Right Ear: Hearing normal.  Left Ear: Hearing normal.  Nose: Nose lacerations and sinus tenderness present.    Mouth/Throat: Uvula is midline.  Eyes: Conjunctivae are normal. Pupils are equal, round, and reactive to light.  Neck: Neck supple.  Musculoskeletal: She exhibits edema and tenderness.       Left knee: She exhibits swelling, effusion and ecchymosis.       Legs: Abrasion just below the left knee with ecchymosis and swelling as well.  Neurological: She is alert and oriented to person, place, and time.  Skin: Skin is warm and dry.  Psychiatric: She has a normal mood and affect. Her behavior is normal.  Vitals reviewed.   ED Course  Procedures (including critical care time)  Labs Review Labs Reviewed - No data to display  Imaging Review No results found.   Visual Acuity Review  Right Eye Distance:   Left Eye Distance:   Bilateral Distance:    Right Eye Near:   Left Eye Near:    Bilateral Near:         MDM   1. Facial laceration, initial encounter   2. Multiple contusions   3. LOC (loss of consciousness)   4. Hx of long term use of blood thinners     Due to the history of renal blood thinners the question of loss of consciousness the facial injury and requiring a CT scan of the head as well as well as x-ray of  the left lower leg with some be needed if possible to facial plasty for the laceration left nostril will refer to the ED. Patient's receives her care at [facility] and that's what you want to go. Notify charge nurse Tammy Sours at [facility] ED of patient's eminent arrival.   Hassan Rowan, MD 01/26/15 1304

## 2015-01-26 NOTE — Discharge Instructions (Signed)
Keep nose clean and dry. Ice and elevate for swelling. Norco if needed for severe pain. Be aware that this medication may cause drowsiness which may increase your risk for falling. Return to the emergency room for suture removal in 5 days You may need to follow up with [facility] if any problems with her nose. Allow swelling to improve before make an appointment Follow up with Dr. Jenne Campus if any continued problems with her nose.

## 2015-01-26 NOTE — ED Notes (Signed)
Pt presents with laceration to L side of her nose. Pt is post fall. Pt states she takes a blood thinner but has not had it since 01/23/2014 due to an upcoming knee surgery. Pt states she is having L knee surgery, bruise noted to L shin. Pt states she tripped on the stairs and doesn't think she blacked out. Laceration noted to be oozing blood at this time.

## 2015-02-01 ENCOUNTER — Encounter: Payer: Self-pay | Admitting: *Deleted

## 2015-02-01 ENCOUNTER — Ambulatory Visit
Admission: RE | Admit: 2015-02-01 | Discharge: 2015-02-01 | Disposition: A | Discharge status: Home / Self Care | Payer: Medicare Other | Source: Ambulatory Visit | Attending: Orthopedic Surgery | Admitting: Orthopedic Surgery

## 2015-02-01 ENCOUNTER — Ambulatory Visit: Payer: Medicare Other | Admitting: Anesthesiology

## 2015-02-01 ENCOUNTER — Encounter
Admission: RE | Disposition: A | Discharge status: Home / Self Care | Payer: Self-pay | Source: Ambulatory Visit | Attending: Orthopedic Surgery

## 2015-02-01 DIAGNOSIS — K219 Gastro-esophageal reflux disease without esophagitis: Secondary | ICD-10-CM | POA: Diagnosis not present

## 2015-02-01 DIAGNOSIS — Z7982 Long term (current) use of aspirin: Secondary | ICD-10-CM | POA: Insufficient documentation

## 2015-02-01 DIAGNOSIS — Z880 Allergy status to penicillin: Secondary | ICD-10-CM | POA: Diagnosis not present

## 2015-02-01 DIAGNOSIS — E785 Hyperlipidemia, unspecified: Secondary | ICD-10-CM | POA: Insufficient documentation

## 2015-02-01 DIAGNOSIS — Z7902 Long term (current) use of antithrombotics/antiplatelets: Secondary | ICD-10-CM | POA: Insufficient documentation

## 2015-02-01 DIAGNOSIS — Z9889 Other specified postprocedural states: Secondary | ICD-10-CM | POA: Insufficient documentation

## 2015-02-01 DIAGNOSIS — M2392 Unspecified internal derangement of left knee: Secondary | ICD-10-CM | POA: Diagnosis present

## 2015-02-01 DIAGNOSIS — Z87442 Personal history of urinary calculi: Secondary | ICD-10-CM | POA: Diagnosis not present

## 2015-02-01 DIAGNOSIS — S83242A Other tear of medial meniscus, current injury, left knee, initial encounter: Secondary | ICD-10-CM | POA: Insufficient documentation

## 2015-02-01 DIAGNOSIS — Z79899 Other long term (current) drug therapy: Secondary | ICD-10-CM | POA: Insufficient documentation

## 2015-02-01 DIAGNOSIS — Z8249 Family history of ischemic heart disease and other diseases of the circulatory system: Secondary | ICD-10-CM | POA: Insufficient documentation

## 2015-02-01 DIAGNOSIS — R001 Bradycardia, unspecified: Secondary | ICD-10-CM | POA: Diagnosis not present

## 2015-02-01 DIAGNOSIS — I10 Essential (primary) hypertension: Secondary | ICD-10-CM | POA: Diagnosis not present

## 2015-02-01 DIAGNOSIS — M810 Age-related osteoporosis without current pathological fracture: Secondary | ICD-10-CM | POA: Diagnosis not present

## 2015-02-01 DIAGNOSIS — I428 Other cardiomyopathies: Secondary | ICD-10-CM | POA: Diagnosis not present

## 2015-02-01 DIAGNOSIS — S83282A Other tear of lateral meniscus, current injury, left knee, initial encounter: Secondary | ICD-10-CM | POA: Insufficient documentation

## 2015-02-01 DIAGNOSIS — X58XXXA Exposure to other specified factors, initial encounter: Secondary | ICD-10-CM | POA: Insufficient documentation

## 2015-02-01 DIAGNOSIS — J3089 Other allergic rhinitis: Secondary | ICD-10-CM | POA: Insufficient documentation

## 2015-02-01 DIAGNOSIS — I2510 Atherosclerotic heart disease of native coronary artery without angina pectoris: Secondary | ICD-10-CM | POA: Insufficient documentation

## 2015-02-01 DIAGNOSIS — Z882 Allergy status to sulfonamides status: Secondary | ICD-10-CM | POA: Insufficient documentation

## 2015-02-01 DIAGNOSIS — M94262 Chondromalacia, left knee: Secondary | ICD-10-CM | POA: Diagnosis not present

## 2015-02-01 HISTORY — PX: KNEE ARTHROSCOPY: SHX127

## 2015-02-01 SURGERY — ARTHROSCOPY, KNEE
Anesthesia: General | Laterality: Left

## 2015-02-01 MED ORDER — LACTATED RINGERS IV SOLN
INTRAVENOUS | Status: DC
  Administered 2015-02-01: 14:00:00 via INTRAVENOUS

## 2015-02-01 MED ORDER — LACTATED RINGERS IR SOLN
Status: DC | PRN
  Administered 2015-02-01: 18000 mL

## 2015-02-01 MED ORDER — BUPIVACAINE HCL (PF) 0.25 % IJ SOLN
INTRAMUSCULAR | Status: DC | PRN
  Administered 2015-02-01: 25 mL

## 2015-02-01 MED ORDER — GLYCOPYRROLATE 0.2 MG/ML IJ SOLN
INTRAMUSCULAR | Status: DC | PRN
  Administered 2015-02-01: 0.2 mg via INTRAVENOUS

## 2015-02-01 MED ORDER — ACETAMINOPHEN 10 MG/ML IV SOLN
INTRAVENOUS | Status: DC | PRN
  Administered 2015-02-01: 1000 mg via INTRAVENOUS

## 2015-02-01 MED ORDER — ONDANSETRON HCL 4 MG/2ML IJ SOLN
INTRAMUSCULAR | Status: DC | PRN
  Administered 2015-02-01: 4 mg via INTRAVENOUS

## 2015-02-01 MED ORDER — HYDROCODONE-ACETAMINOPHEN 5-325 MG PO TABS: 1.0000 | ORAL_TABLET | ORAL | Status: DC | PRN

## 2015-02-01 MED ORDER — EPHEDRINE SULFATE 50 MG/ML IJ SOLN
INTRAMUSCULAR | Status: DC | PRN
  Administered 2015-02-01: 5 mg via INTRAVENOUS
  Administered 2015-02-01: 10 mg via INTRAVENOUS

## 2015-02-01 MED ORDER — MORPHINE SULFATE (PF) 4 MG/ML IV SOLN
INTRAVENOUS | Status: AC
  Filled 2015-02-01: qty 1

## 2015-02-01 MED ORDER — DEXAMETHASONE SODIUM PHOSPHATE 10 MG/ML IJ SOLN
INTRAMUSCULAR | Status: DC | PRN
  Administered 2015-02-01: 10 mg via INTRAVENOUS

## 2015-02-01 MED ORDER — ACETAMINOPHEN 10 MG/ML IV SOLN
INTRAVENOUS | Status: AC
  Filled 2015-02-01: qty 100

## 2015-02-01 MED ORDER — PROPOFOL 10 MG/ML IV BOLUS
INTRAVENOUS | Status: DC | PRN
  Administered 2015-02-01: 200 mg via INTRAVENOUS

## 2015-02-01 MED ORDER — FENTANYL CITRATE (PF) 100 MCG/2ML IJ SOLN: 25.0000 ug | INTRAMUSCULAR | Status: DC | PRN

## 2015-02-01 MED ORDER — BUPIVACAINE-EPINEPHRINE 0.25% -1:200000 IJ SOLN
INTRAMUSCULAR | Status: DC | PRN
  Administered 2015-02-01: 5 mL

## 2015-02-01 MED ORDER — FENTANYL CITRATE (PF) 100 MCG/2ML IJ SOLN
INTRAMUSCULAR | Status: DC | PRN
  Administered 2015-02-01 (×2): 50 ug via INTRAVENOUS

## 2015-02-01 MED ORDER — BUPIVACAINE-EPINEPHRINE (PF) 0.25% -1:200000 IJ SOLN
INTRAMUSCULAR | Status: AC
  Filled 2015-02-01: qty 30

## 2015-02-01 MED ORDER — ONDANSETRON HCL 4 MG/2ML IJ SOLN: 4.0000 mg | Freq: Once | INTRAMUSCULAR | Status: DC | PRN

## 2015-02-01 SURGICAL SUPPLY — 26 items
BLADE SHAVER 4.5 DBL SERAT CV (CUTTER) ×3 IMPLANT
BNDG ESMARK 6X12 TAN STRL LF (GAUZE/BANDAGES/DRESSINGS) IMPLANT
DRSG DERMACEA 8X12 NADH (GAUZE/BANDAGES/DRESSINGS) ×3 IMPLANT
DURAPREP 26ML APPLICATOR (WOUND CARE) ×6 IMPLANT
GAUZE SPONGE 4X4 12PLY STRL (GAUZE/BANDAGES/DRESSINGS) ×3 IMPLANT
GLOVE BIO SURGEON STRL SZ7 (GLOVE) ×3 IMPLANT
GLOVE BIOGEL M STRL SZ7.5 (GLOVE) ×3 IMPLANT
GLOVE INDICATOR 7.5 STRL GRN (GLOVE) ×3 IMPLANT
GLOVE INDICATOR 8.0 STRL GRN (GLOVE) ×3 IMPLANT
GOWN STRL REUS W/ TWL LRG LVL3 (GOWN DISPOSABLE) IMPLANT
GOWN STRL REUS W/ TWL LRG LVL4 (GOWN DISPOSABLE) ×1 IMPLANT
GOWN STRL REUS W/TWL LRG LVL3 (GOWN DISPOSABLE)
GOWN STRL REUS W/TWL LRG LVL4 (GOWN DISPOSABLE) ×2
GOWN STRL REUS W/TWL XL LVL4 (GOWN DISPOSABLE) ×3 IMPLANT
IV LACTATED RINGER IRRG 3000ML (IV SOLUTION) ×12
IV LR IRRIG 3000ML ARTHROMATIC (IV SOLUTION) ×6 IMPLANT
MANIFOLD NEPTUNE II (INSTRUMENTS) ×3 IMPLANT
PACK ARTHROSCOPY KNEE (MISCELLANEOUS) ×3 IMPLANT
SET TUBE SUCT SHAVER OUTFL 24K (TUBING) ×3 IMPLANT
SET TUBE TIP INTRA-ARTICULAR (MISCELLANEOUS) ×3 IMPLANT
STRAP SAFETY BODY (MISCELLANEOUS) ×3 IMPLANT
SUT ETHILON 3-0 FS-10 30 BLK (SUTURE) ×3
SUTURE EHLN 3-0 FS-10 30 BLK (SUTURE) ×1 IMPLANT
TUBING ARTHRO INFLOW-ONLY STRL (TUBING) ×3 IMPLANT
WAND HAND CNTRL MULTIVAC 50 (MISCELLANEOUS) ×3 IMPLANT
WRAP KNEE W/COLD PACKS 25.5X14 (SOFTGOODS) ×3 IMPLANT

## 2015-02-01 NOTE — Op Note (Signed)
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   ASSISTANT: none  ANESTHESIA: general  ESTIMATED BLOOD LOSS: Minimal  FLUIDS REPLACED 450 mL of crystalloid  TOURNIQUET TIME: Not used    DRAINS: none  IMPLANTS UTILIZED: None  INDICATIONS FOR SURGERY: Katelyn Crawford is a 75 y.o. year old female who has been seen for complaints of left knee pain. MRI demonstrated findings consistent with meniscal pathology. After discussion of the risks and benefits of surgical intervention, the patient expressed understanding of the risks benefits and agree with plans for left knee arthroscopy.   PROCEDURE IN DETAIL: The patient was brought into the operating room and, after adequate general anesthesia was achieved, a tourniquet was applied to the left thigh and the leg was placed in the leg holder. All bony prominences were well padded. The patient's left knee was cleaned and prepped with alcohol and Duraprep and draped in the usual sterile fashion. A "timeout" was performed as per usual protocol. The anticipated portal sites were injected with 0.25% Marcaine with epinephrine. An anterolateral incision was made and a cannula was inserted. A moderate effusion was evacuated and the knee was distended with fluid using the pump. The scope was advanced down the medial gutter into the medial compartment. Under visualization with the scope, an anteromedial portal was created and a hooked probe was inserted. The medial meniscus  was visualized and probed. There was a degenerative tear of the posterior horn of the medial meniscus. The tear was debrided using meniscal punches and a 4.5 mm incisor shaver. Final contouring was performed using the 50 ArthroCare wand. The articular cartilage was visualized.  There were grade 3 to early grade 4 changes of chondromalacia involving the medial femoral condyle and less so to the tibial plateau. These areas were debrided and contoured using the ArthroCare wand.  The scope was then advanced into the intercondylar notch. The anterior cruciate ligament was visualized and probed and felt to be intact. The scope was removed from the lateral portal and reinserted via the anteromedial portal to better visualize the lateral compartment. The lateral meniscus was visualized and probed.Degenerative tear was noted along the anterior horn and lateral aspect of the lateral meniscus. The tear was debrided using the 4.5 mm incisor shaver and then contoured using the 50 ArthroCare wand. The remaining rim of meniscus was visualized and probed and felt to be stable.  The articular cartilage of the lateral compartment was visualized and noted to be in good condition.  Finally, the scope was advanced so as to visualize the patellofemoral articulation. Good patellar tracking was appreciated.  There was a localized area of grade 3 chondromalacia involving the patellofemoral articulation. The area was debrided and contoured using the ArthroCare wand.  The joint was irrigated with copius amounts of fluid and suctioned dry. The anterolateral portal was re-approximated with #3-0 nylon. A combination of 0.25% Marcaine with epinephrine and 4 mg of Morphine were injected via the scope. The scope was removed and the anteromedial portal was  re-approximated with #3-0 nylon. A sterile dressing was applied followed by application of an ice wrap.  The patient tolerated the procedure well and was transported to the PACU in stable  condition.  Katelyn Crawford., M.D.

## 2015-02-01 NOTE — OR Nursing (Signed)
Patient with old bruises to both eyes and suture site with scabbing to left side of nose.

## 2015-02-01 NOTE — H&P (Signed)
The patient has been re-examined, and the chart reviewed, and there have been no interval changes to the documented history and physical.    The risks, benefits, and alternatives have been discussed at length. The patient expressed understanding of the risks benefits and agreed with plans for surgical intervention.  Everline Mahaffy P. Cheronda Erck, Jr. M.D.    

## 2015-02-01 NOTE — Anesthesia Preprocedure Evaluation (Signed)
Anesthesia Evaluation  Patient identified by MRN, date of birth, ID band Patient awake    Reviewed: Allergy & Precautions, NPO status   Airway Mallampati: III       Dental  (+) Upper Dentures, Lower Dentures   Pulmonary neg pulmonary ROS,    breath sounds clear to auscultation       Cardiovascular hypertension, Pt. on home beta blockers  Rhythm:Regular Rate:Normal     Neuro/Psych negative neurological ROS  negative psych ROS   GI/Hepatic Neg liver ROS, GERD  ,  Endo/Other  negative endocrine ROS  Renal/GU negative Renal ROS     Musculoskeletal negative musculoskeletal ROS (+)   Abdominal Normal abdominal exam  (+)   Peds  Hematology negative hematology ROS (+)   Anesthesia Other Findings   Reproductive/Obstetrics                             Anesthesia Physical Anesthesia Plan  ASA: II  Anesthesia Plan: General   Post-op Pain Management:    Induction: Intravenous  Airway Management Planned: LMA  Additional Equipment:   Intra-op Plan:   Post-operative Plan: Extubation in OR  Informed Consent: I have reviewed the patients History and Physical, chart, labs and discussed the procedure including the risks, benefits and alternatives for the proposed anesthesia with the patient or authorized representative who has indicated his/her understanding and acceptance.     Plan Discussed with: CRNA  Anesthesia Plan Comments:         Anesthesia Quick Evaluation

## 2015-02-01 NOTE — Brief Op Note (Signed)
02/01/2015  5:29 PM  PATIENT:  Katelyn Crawford  75 y.o. female  PRE-OPERATIVE DIAGNOSIS:  internal derangement left knee  POST-OPERATIVE DIAGNOSIS:  medial & lateral meniscus tears,grade 3 to 4 chondromalacia left knee  PROCEDURE:  Procedure(s): ARTHROSCOPY KNEE, partial medial & lateral menisectomy,,medial and patelofemoral chondroplasty (Left)  SURGEON:  Surgeon(s) and Role:    * Donato Heinz, MD - Primary  ASSISTANTS: none   ANESTHESIA:   general  EBL:  Total I/O In: 450 [I.V.:450] Out: -   BLOOD ADMINISTERED:none  DRAINS: none   LOCAL MEDICATIONS USED:  MARCAINE     SPECIMEN:  No Specimen  DISPOSITION OF SPECIMEN:  N/A  COUNTS:  YES  TOURNIQUET: Not used  DICTATION: .Dragon Dictation  PLAN OF CARE: Discharge to home after PACU  PATIENT DISPOSITION:  PACU - hemodynamically stable.   Delay start of Pharmacological VTE agent (>24hrs) due to surgical blood loss or risk of bleeding: not applicable

## 2015-02-01 NOTE — Discharge Instructions (Signed)
°  Instructions after Knee Arthroscopy  ° °- James P. Hooten, Jr., M.D.    ° Dept. of Orthopaedics & Sports Medicine ° [facility] ° [address] ° [place], [place]  [address] ° ° Phone: [phone]   Fax: [phone] ° ° °DIET: °• Drink plenty of non-alcoholic fluids & begin a light diet. °• Resume your normal diet the day after surgery. ° °ACTIVITY:  °• You may use crutches or a walker with weight-bearing as tolerated, unless instructed otherwise. °• You may wean yourself off of the walker or crutches as tolerated.  °• Begin doing gentle exercises. Exercising will reduce the pain and swelling, increase motion, and prevent muscle weakness.   °• Avoid strenuous activities or athletics for a minimum of 4-6 weeks after arthroscopic surgery. °• Do not drive or operate any equipment until instructed. ° °WOUND CARE:  °• Place one to two pillows under the knee the first day or two when sitting or lying.  °• Continue to use the ice packs periodically to reduce pain and swelling. °• The small incisions in your knee are closed with nylon stitches. The stitches will be removed in the office. °• The bulky dressing may be removed on the second day after surgery. DO NOT TOUCH THE STITCHES. Put a Band-Aid over each stitch. Do NOT use any ointments or creams on the incisions.  °• You may bathe or shower after the stitches are removed at the first office visit following surgery. ° °MEDICATIONS: °• You may resume your regular medications. °• Please take the pain medication as prescribed. °• Do not take pain medication on an empty stomach. °• Do not drive or drink alcoholic beverages when taking pain medications. ° °CALL THE OFFICE FOR: °• Temperature above 101 degrees °• Excessive bleeding or drainage on the dressing. °• Excessive swelling, coldness, or paleness of the toes. °• Persistent nausea and vomiting. ° °FOLLOW-UP:  °• You should have an appointment to return to the office in 7-10 days after surgery.   ° ° °AMBULATORY SURGERY  °DISCHARGE INSTRUCTIONS ° ° °1) The drugs that you were given will stay in your system until tomorrow so for the next 24 hours you should not: ° °A) Drive an automobile °B) Make any legal decisions °C) Drink any alcoholic beverage ° ° °2) You may resume regular meals tomorrow.  Today it is better to start with liquids and gradually work up to solid foods. ° °You may eat anything you prefer, but it is better to start with liquids, then soup and crackers, and gradually work up to solid foods. ° ° °3) Please notify your doctor immediately if you have any unusual bleeding, trouble breathing, redness and pain at the surgery site, drainage, fever, or pain not relieved by medication. ° ° ° °4) Additional Instructions: ° ° ° ° ° ° ° °Please contact your physician with any problems or Same Day Surgery at [phone], Monday through Friday 6 am to 4 pm, or [place] [facility] number at [phone]. ° °

## 2015-02-01 NOTE — Anesthesia Procedure Notes (Signed)
Procedure Name: LMA Insertion Date/Time: 02/01/2015 3:48 PM Performed by: Omer Jack Pre-anesthesia Checklist: Patient identified, Patient being monitored, Timeout performed, Emergency Drugs available and Suction available Patient Re-evaluated:Patient Re-evaluated prior to inductionOxygen Delivery Method: Circle system utilized Preoxygenation: Pre-oxygenation with 100% oxygen Intubation Type: IV induction Ventilation: Mask ventilation without difficulty LMA: LMA inserted LMA Size: 3.0 Tube type: Oral Number of attempts: 1 Placement Confirmation: positive ETCO2 and breath sounds checked- equal and bilateral Tube secured with: Tape Dental Injury: Teeth and Oropharynx as per pre-operative assessment

## 2015-02-01 NOTE — Transfer of Care (Signed)
Immediate Anesthesia Transfer of Care Note  Patient: Katelyn Crawford  Procedure(s) Performed: Procedure(s): ARTHROSCOPY KNEE, partial medial & lateral menisectomy,,medial and patelofemoral chondroplasty (Left)  Patient Location: PACU  Anesthesia Type:General  Level of Consciousness: patient cooperative and lethargic  Airway & Oxygen Therapy: Patient Spontanous Breathing and Patient connected to face mask oxygen  Post-op Assessment: Report given to RN and Post -op Vital signs reviewed and stable  Post vital signs: Reviewed and stable  Last Vitals:  Filed Vitals:   02/01/15 1341 02/01/15 1724  BP: 169/71 178/85  Pulse: 63 96  Temp: 37 C 37.3 C  Resp: 16 20    Complications: No apparent anesthesia complications

## 2015-02-02 ENCOUNTER — Encounter: Payer: Self-pay | Admitting: Orthopedic Surgery

## 2015-02-14 NOTE — Anesthesia Postprocedure Evaluation (Signed)
Anesthesia Post Note  Patient: Katelyn Crawford  Procedure(s) Performed: Procedure(s) (LRB): ARTHROSCOPY KNEE, partial medial & lateral menisectomy,,medial and patelofemoral chondroplasty (Left)  Anesthesia Type: General Level of consciousness: awake Pain management: pain level controlled Vital Signs Assessment: post-procedure vital signs reviewed and stable Respiratory status: nonlabored ventilation Cardiovascular status: stable Anesthetic complications: no    Last Vitals:  Filed Vitals:   02/01/15 1819 02/01/15 1852  BP: 174/80 182/74  Pulse: 78 72  Temp: 37.5 C   Resp: 20 20    Last Pain:  Filed Vitals:   02/02/15 0759  PainSc: 0-No pain                 VAN STAVEREN,Monte Zinni

## 2015-03-02 DIAGNOSIS — I453 Trifascicular block: Secondary | ICD-10-CM | POA: Insufficient documentation

## 2015-03-19 ENCOUNTER — Encounter: Payer: Self-pay | Admitting: Gynecology

## 2015-03-19 ENCOUNTER — Ambulatory Visit
Admission: EM | Admit: 2015-03-19 | Discharge: 2015-03-19 | Disposition: A | Discharge status: Home / Self Care | Payer: Medicare Other | Attending: Family Medicine | Admitting: Family Medicine

## 2015-03-19 DIAGNOSIS — B9789 Other viral agents as the cause of diseases classified elsewhere: Principal | ICD-10-CM

## 2015-03-19 DIAGNOSIS — J069 Acute upper respiratory infection, unspecified: Secondary | ICD-10-CM

## 2015-03-19 NOTE — ED Notes (Signed)
Patient c/o dry cough and muscle ache. Per pt saw her PCP in January for a cough and she given Rx Benzonate 100 mg. Per pt had refill on her prescription x 3 days ago. Per pt. Cough not better.

## 2015-03-19 NOTE — ED Provider Notes (Signed)
CSN: [id number]     Arrival date & time 03/19/15  0845 History   First MD Initiated Contact with Patient 03/19/15 1010     Chief Complaint  Patient presents with  . Cough   (Consider location/radiation/quality/duration/timing/severity/associated sxs/prior Treatment) Patient is a 75 y.o. female presenting with cough and URI. The history is provided by the patient.  Cough Associated symptoms: rhinorrhea   Associated symptoms: no fever, no headaches, no myalgias and no wheezing   URI Presenting symptoms: congestion, cough and rhinorrhea   Presenting symptoms: no facial pain, no fatigue and no fever   Severity:  Mild Onset quality:  Sudden Duration:  3 days Timing:  Constant Progression:  Worsening Chronicity:  New Relieved by:  OTC medications Associated symptoms: no arthralgias, no headaches, no myalgias, no neck pain, no sinus pain, no sneezing, no swollen glands and no wheezing   Risk factors: being elderly, chronic cardiac disease and sick contacts   Risk factors: no chronic kidney disease, no chronic respiratory disease, no diabetes mellitus, no immunosuppression, no recent illness and no recent travel     Past Medical History  Diagnosis Date  . Hypertension   . Heart disease   . Vascular disease   . Coronary artery disease   . GERD (gastroesophageal reflux disease)    Past Surgical History  Procedure Laterality Date  . Appendectomy    . Varicose vein surgery    . Breast lumpectomy  [id number]  . Breast biopsy Left 1984    neg  . Coronary angioplasty with stent placement    . Tubal ligation    . Knee arthroscopy Left 02/01/2015    Procedure: ARTHROSCOPY KNEE, partial medial & lateral menisectomy,,medial and patelofemoral chondroplasty;  Surgeon: Donato Heinz, MD;  Location: [facility] ORS;  Service: Orthopedics;  Laterality: Left;   No family history on file. Social History  Substance Use Topics  . Smoking status: Never Smoker   . Smokeless tobacco: Never Used  .  Alcohol Use: No   OB History    No data available     Review of Systems  Constitutional: Negative for fever and fatigue.  HENT: Positive for congestion and rhinorrhea. Negative for sneezing.   Respiratory: Positive for cough. Negative for wheezing.   Musculoskeletal: Negative for myalgias, arthralgias and neck pain.  Neurological: Negative for headaches.    Allergies  Penicillins and Sulfa antibiotics  Home Medications   Prior to Admission medications   Medication Sig Start Date End Date Taking? Authorizing Provider  aspirin 81 MG tablet Take 81 mg by mouth daily.   Yes Historical Provider, MD  atorvastatin (LIPITOR) 40 MG tablet Take 40 mg by mouth daily.   Yes Historical Provider, MD  B Complex-C (SUPER B COMPLEX PO) Take 1 capsule by mouth daily.   Yes Historical Provider, MD  CALCIUM CARBONATE-VITAMIN D PO Take by mouth.   Yes Historical Provider, MD  carvedilol (COREG) 6.25 MG tablet Take 6.25 mg by mouth 2 (two) times daily with a meal.   Yes Historical Provider, MD  cetirizine (ZYRTEC) 10 MG tablet Take 10 mg by mouth at bedtime.   Yes Historical Provider, MD  Cholecalciferol (VITAMIN D-3 PO) Take 1,000 Int'l Units by mouth daily.   Yes Historical Provider, MD  clopidogrel (PLAVIX) 75 MG tablet Take 75 mg by mouth daily.   Yes Historical Provider, MD  COENZYME Q10-OMEGA 3 FATTY ACD PO Take 2 tablets by mouth daily.   Yes Historical Provider, MD  Multiple Minerals (CALCIUM/MAGNESIUM/ZINC PO)  Take by mouth.   Yes Historical Provider, MD  Multiple Vitamins-Minerals (MULTIVITAMIN ADULT) TABS Take by mouth.   Yes Historical Provider, MD  omeprazole (PRILOSEC) 20 MG capsule Take 20 mg by mouth daily as needed.    Yes Historical Provider, MD  sertraline (ZOLOFT) 25 MG tablet Take 25 mg by mouth at bedtime.    Yes Historical Provider, MD  vitamin C (ASCORBIC ACID) 500 MG tablet Take 500 mg by mouth daily.   Yes Historical Provider, MD  HYDROcodone-acetaminophen (NORCO) 5-325 MG  tablet Take 1-2 tablets by mouth every 4 (four) hours as needed for moderate pain. 02/01/15   Donato Heinz, MD   Meds Ordered and Administered this Visit  Medications - No data to display  BP 118/67 mmHg  Pulse 72  Temp(Src) 98.1 F (36.7 C) (Oral)  Resp 16  Ht 4\' 11"  (1.499 m)  Wt 155 lb (70.308 kg)  BMI 31.29 kg/m2  SpO2 100% No data found.   Physical Exam  Constitutional: She appears well-developed and well-nourished. No distress.  HENT:  Head: Normocephalic and atraumatic.  Right Ear: Tympanic membrane, external ear and ear canal normal.  Left Ear: Tympanic membrane, external ear and ear canal normal.  Nose: Mucosal edema and rhinorrhea present. No nose lacerations, sinus tenderness, nasal deformity, septal deviation or nasal septal hematoma. No epistaxis.  No foreign bodies.  Mouth/Throat: Uvula is midline and mucous membranes are normal. No oropharyngeal exudate, posterior oropharyngeal edema, posterior oropharyngeal erythema or tonsillar abscesses.  Eyes: Conjunctivae and EOM are normal. Pupils are equal, round, and reactive to light. Right eye exhibits no discharge. Left eye exhibits no discharge. No scleral icterus.  Neck: Normal range of motion. Neck supple. No thyromegaly present.  Cardiovascular: Normal rate, regular rhythm and normal heart sounds.   Pulmonary/Chest: Effort normal and breath sounds normal. No respiratory distress. She has no wheezes. She has no rales.  Lymphadenopathy:    She has no cervical adenopathy.  Skin: She is not diaphoretic.  Nursing note and vitals reviewed.   ED Course  Procedures (including critical care time)  Labs Review Labs Reviewed - No data to display  Imaging Review No results found.   Visual Acuity Review  Right Eye Distance:   Left Eye Distance:   Bilateral Distance:    Right Eye Near:   Left Eye Near:    Bilateral Near:         MDM   1. Viral URI with cough    1. diagnosis reviewed with patient 2. rx  as per orders above; reviewed possible side effects, interactions, risks and benefits  3. Recommend supportive treatment with increased fluids, otc analgesics prn 4. Follow-up prn if symptoms worsen or don't improve    Payton Mccallum, MD 03/19/15 1035

## 2015-09-18 ENCOUNTER — Other Ambulatory Visit: Payer: Self-pay | Admitting: Internal Medicine

## 2015-09-18 DIAGNOSIS — Z1231 Encounter for screening mammogram for malignant neoplasm of breast: Secondary | ICD-10-CM

## 2015-10-02 ENCOUNTER — Ambulatory Visit: Payer: Medicare Other

## 2015-10-19 ENCOUNTER — Ambulatory Visit
Admission: RE | Admit: 2015-10-19 | Discharge: 2015-10-19 | Disposition: A | Discharge status: Home / Self Care | Payer: Medicare Other | Source: Ambulatory Visit | Attending: Internal Medicine | Admitting: Internal Medicine

## 2015-10-19 ENCOUNTER — Other Ambulatory Visit: Payer: Self-pay | Admitting: Internal Medicine

## 2015-10-19 DIAGNOSIS — Z1231 Encounter for screening mammogram for malignant neoplasm of breast: Secondary | ICD-10-CM | POA: Insufficient documentation

## 2016-01-22 DIAGNOSIS — I453 Trifascicular block: Secondary | ICD-10-CM

## 2016-01-22 HISTORY — DX: Trifascicular block: I45.3

## 2016-06-05 ENCOUNTER — Other Ambulatory Visit: Payer: Self-pay | Admitting: Internal Medicine

## 2016-06-05 DIAGNOSIS — R1310 Dysphagia, unspecified: Secondary | ICD-10-CM

## 2016-06-12 ENCOUNTER — Ambulatory Visit
Admission: RE | Admit: 2016-06-12 | Discharge: 2016-06-12 | Disposition: A | Discharge status: Home / Self Care | Payer: Medicare Other | Source: Ambulatory Visit | Attending: Internal Medicine | Admitting: Internal Medicine

## 2016-06-12 DIAGNOSIS — R1310 Dysphagia, unspecified: Secondary | ICD-10-CM | POA: Diagnosis present

## 2016-06-12 DIAGNOSIS — K219 Gastro-esophageal reflux disease without esophagitis: Secondary | ICD-10-CM | POA: Insufficient documentation

## 2016-06-12 DIAGNOSIS — K224 Dyskinesia of esophagus: Secondary | ICD-10-CM | POA: Diagnosis not present

## 2016-09-19 ENCOUNTER — Other Ambulatory Visit: Payer: Self-pay | Admitting: Internal Medicine

## 2016-09-19 DIAGNOSIS — Z1231 Encounter for screening mammogram for malignant neoplasm of breast: Secondary | ICD-10-CM

## 2016-10-12 ENCOUNTER — Encounter: Payer: Self-pay | Admitting: Emergency Medicine

## 2016-10-12 ENCOUNTER — Emergency Department
Admission: EM | Admit: 2016-10-12 | Discharge: 2016-10-12 | Disposition: A | Discharge status: Home / Self Care | Payer: Medicare Other | Attending: Emergency Medicine | Admitting: Emergency Medicine

## 2016-10-12 DIAGNOSIS — R040 Epistaxis: Secondary | ICD-10-CM

## 2016-10-12 DIAGNOSIS — Z955 Presence of coronary angioplasty implant and graft: Secondary | ICD-10-CM | POA: Insufficient documentation

## 2016-10-12 DIAGNOSIS — I2510 Atherosclerotic heart disease of native coronary artery without angina pectoris: Secondary | ICD-10-CM | POA: Diagnosis not present

## 2016-10-12 DIAGNOSIS — Z7982 Long term (current) use of aspirin: Secondary | ICD-10-CM | POA: Diagnosis not present

## 2016-10-12 DIAGNOSIS — I10 Essential (primary) hypertension: Secondary | ICD-10-CM | POA: Diagnosis not present

## 2016-10-12 DIAGNOSIS — Z79899 Other long term (current) drug therapy: Secondary | ICD-10-CM | POA: Diagnosis not present

## 2016-10-12 MED ORDER — SILVER NITRATE-POT NITRATE 75-25 % EX MISC
CUTANEOUS | Status: AC
  Administered 2016-10-12: 04:00:00
  Filled 2016-10-12: qty 2

## 2016-10-12 MED ORDER — BACITRACIN ZINC 500 UNIT/GM EX OINT
TOPICAL_OINTMENT | CUTANEOUS | Status: AC
  Administered 2016-10-12: 04:00:00
  Filled 2016-10-12: qty 0.9

## 2016-10-12 MED ORDER — TRANEXAMIC ACID 1000 MG/10ML IV SOLN
500.0000 mg | Freq: Once | INTRAVENOUS | Status: AC
  Administered 2016-10-12: 500 mg via TOPICAL
  Filled 2016-10-12: qty 10

## 2016-10-12 NOTE — ED Provider Notes (Signed)
[facility] Emergency Department Provider Note  ____________________________________________   First MD Initiated Contact with Patient 10/12/16 0230     (approximate)  I have reviewed the triage vital signs and the nursing notes.   HISTORY  Chief Complaint Epistaxis   HPI Katelyn Crawford is a 76 y.o. female with history of coronary artery disease on Plavix was playing with a right-sided nosebleed. She says that this woke her from sleep at about 1 AM and that she has had clots. She denies picking her nose. EMS was called who tried Afrin without resolution of the nosebleed.   Past Medical History:  Diagnosis Date  . Coronary artery disease   . GERD (gastroesophageal reflux disease)   . Heart disease   . Hypertension   . Vascular disease     There are no active problems to display for this patient.   Past Surgical History:  Procedure Laterality Date  . APPENDECTOMY    . BREAST EXCISIONAL BIOPSY Left 1984   neg  . BREAST LUMPECTOMY  [id number]  . CORONARY ANGIOPLASTY WITH STENT PLACEMENT    . KNEE ARTHROSCOPY Left 02/01/2015   Procedure: ARTHROSCOPY KNEE, partial medial & lateral menisectomy,,medial and patelofemoral chondroplasty;  Surgeon: Donato Heinz, MD;  Location: [facility] ORS;  Service: Orthopedics;  Laterality: Left;  . TUBAL LIGATION    . VARICOSE VEIN SURGERY      Prior to Admission medications   Medication Sig Start Date End Date Taking? Authorizing Provider  aspirin 81 MG tablet Take 81 mg by mouth daily.    [provider]  atorvastatin (LIPITOR) 40 MG tablet Take 40 mg by mouth daily.    [provider]  B Complex-C (SUPER B COMPLEX PO) Take 1 capsule by mouth daily.    [provider]  CALCIUM CARBONATE-VITAMIN D PO Take by mouth.    [provider]  carvedilol (COREG) 6.25 MG tablet Take 6.25 mg by mouth 2 (two) times daily with a meal.    [provider]  cetirizine (ZYRTEC) 10 MG tablet  Take 10 mg by mouth at bedtime.    [provider]  Cholecalciferol (VITAMIN D-3 PO) Take 1,000 Int'l Units by mouth daily.    [provider]  clopidogrel (PLAVIX) 75 MG tablet Take 75 mg by mouth daily.    [provider]  COENZYME Q10-OMEGA 3 FATTY ACD PO Take 2 tablets by mouth daily.    [provider]  HYDROcodone-acetaminophen (NORCO) 5-325 MG tablet Take 1-2 tablets by mouth every 4 (four) hours as needed for moderate pain. 02/01/15   Hooten, Illene Labrador, MD  Multiple Minerals (CALCIUM/MAGNESIUM/ZINC PO) Take by mouth.    [provider]  Multiple Vitamins-Minerals (MULTIVITAMIN ADULT) TABS Take by mouth.    [provider]  omeprazole (PRILOSEC) 20 MG capsule Take 20 mg by mouth daily as needed.     [provider]  sertraline (ZOLOFT) 25 MG tablet Take 25 mg by mouth at bedtime.     [provider]  vitamin C (ASCORBIC ACID) 500 MG tablet Take 500 mg by mouth daily.    [provider]    Allergies Penicillins and Sulfa antibiotics  Family History  Problem Relation Age of Onset  . Breast cancer Neg Hx     Social History Social History  Substance Use Topics  . Smoking status: Never Smoker  . Smokeless tobacco: Never Used  . Alcohol use No    Review of Systems  Constitutional: No fever/chills Eyes: No visual changes. ENT: No sore throat. Cardiovascular: Denies chest pain. Respiratory: Denies shortness of breath. Gastrointestinal: No abdominal pain.  No nausea, no vomiting.  No diarrhea.  No constipation. Genitourinary: Negative for dysuria. Musculoskeletal: Negative for back pain. Skin: Negative for rash. Neurological: Negative for headaches, focal weakness or numbness.   ____________________________________________   PHYSICAL EXAM:  VITAL SIGNS: ED Triage Vitals  Enc Vitals Group     BP 10/12/16 0230 (!) 178/78     Pulse Rate 10/12/16 0230 68     Resp 10/12/16 0230 (!) 21     Temp  10/12/16 0230 98.4 F (36.9 C)     Temp Source 10/12/16 0230 Axillary     SpO2 10/12/16 0230 98 %     Weight 10/12/16 0231 155 lb (70.3 kg)     Height 10/12/16 0231  (1.473 m)     Head Circumference --      Peak Flow --      Pain Score --      Pain Loc --      Pain Edu? --      Excl. in GC? --     Constitutional: Alert and oriented. Well appearing and in no acute distress. Eyes: Conjunctivae are normal.  Head: Atraumatic. Nose: patient initially holding a bloody towel over her nose. Removed to the towel and blood was dripping from the right nostril. No blood from the left nostril. Patient blew her nose and was able to express clots. A nasal clamp was then placed.   Mouth/Throat: Mucous membranes are moist.  Neck: No stridor.   Cardiovascular: Normal rate, regular rhythm. Grossly normal heart sounds.   Respiratory: Normal respiratory effort.  No retractions. Lungs CTAB. Gastrointestinal: Soft and nontender. No distention. No CVA tenderness. Musculoskeletal: No lower extremity tenderness nor edema.  No joint effusions. Neurologic:  Normal speech and language. No gross focal neurologic deficits are appreciated. Skin:  Skin is warm, dry and intact. No rash noted. Psychiatric: Mood and affect are normal. Speech and behavior are normal.  ____________________________________________   LABS (all labs ordered are listed, but only abnormal results are displayed)  Labs Reviewed - No data to display ____________________________________________  EKG   ____________________________________________  RADIOLOGY   ____________________________________________   PROCEDURES  Procedure(s) performed:   Procedures  Critical Care performed:   ____________________________________________   INITIAL IMPRESSION / ASSESSMENT AND PLAN / ED COURSE  Pertinent labs & imaging results that were available during my care of the patient were reviewed by me and considered in my medical  decision making (see chart for details). DDX: Anterior epistaxis, posterior epistaxis ----------------------------------------- 6:43 AM on 10/12/2016 -----------------------------------------  initially the patient had mostly suffer bleeding with just clamping and simple pressure. However, once the clamp was removed she said that she still felt moisture in the back of her nose consistent with bleeding. I attempted to cauterize the lesion with silver nitrate but may have dislodged a clot and she then began bleeding once again. She spit up a large clot at that time. I then soaked cotton balls with TX a, placed a cotton ball in the right nostril and we clamped. After a period of about 45 minutes the clamp was undone and the cotton ball was removed. The patient has had no further bleeding and will be discharged at this time.      ____________________________________________   FINAL CLINICAL IMPRESSION(S) / ED DIAGNOSES  epistaxis    NEW MEDICATIONS STARTED DURING THIS VISIT:  New Prescriptions   No medications on file     Note:  This document was prepared using Dragon voice recognition software and may include unintentional dictation errors.     Myrna Blazer, MD 10/12/16 [phone]

## 2016-10-12 NOTE — ED Triage Notes (Signed)
Pt bib [facility] from home d/t nosebleed beginning when standing up from bed approx 0100. PT hx HTN, forgot to take nighttime BP meds. BP in route 220/100Afrin and direct pressure without resolve per EMS. EDP Schaevitz in room upon pt arrival to department, clamp applied to nose.

## 2016-10-12 NOTE — ED Notes (Signed)
Pt reports nasal drainage has stopped at this time.

## 2016-10-12 NOTE — ED Notes (Signed)
Bleeding remains ceased at thsi time.

## 2016-10-12 NOTE — ED Notes (Signed)
Pt. Verbalizes understanding of d/c instructions and follow-up. VS stable and pain controlled per pt.  Pt. In NAD at time of d/c and denies further concerns regarding this visit. Pt. Stable at the time of departure from the unit, departing unit by the safest and most appropriate manner per that pt condition and limitations. Pt advised to return to the ED at any time for emergent concerns, or for new/worsening symptoms.   

## 2016-10-12 NOTE — ED Notes (Addendum)
ED Provider at bedside with TXA

## 2016-10-12 NOTE — ED Notes (Signed)
EDP notified that pt continues to have moderate drainage from nose into throat

## 2016-10-12 NOTE — ED Notes (Signed)
Pt states bleeding minimized at this time.

## 2016-10-12 NOTE — ED Notes (Addendum)
Pt assist to toilet in tx room without complications. Nose clamp remains in place. Pt reports drainage minimal at this time.

## 2016-10-21 ENCOUNTER — Ambulatory Visit
Admission: RE | Admit: 2016-10-21 | Discharge: 2016-10-21 | Disposition: A | Discharge status: Home / Self Care | Payer: Medicare Other | Source: Ambulatory Visit | Attending: Internal Medicine | Admitting: Internal Medicine

## 2016-10-21 DIAGNOSIS — Z1231 Encounter for screening mammogram for malignant neoplasm of breast: Secondary | ICD-10-CM | POA: Diagnosis not present

## 2016-11-27 ENCOUNTER — Ambulatory Visit
Admission: RE | Admit: 2016-11-27 | Discharge: 2016-11-27 | Disposition: A | Discharge status: Home / Self Care | Payer: Medicare Other | Source: Ambulatory Visit | Attending: Internal Medicine | Admitting: Internal Medicine

## 2016-11-27 ENCOUNTER — Encounter
Admission: RE | Disposition: A | Discharge status: Home / Self Care | Payer: Self-pay | Source: Ambulatory Visit | Attending: Internal Medicine

## 2016-11-27 ENCOUNTER — Ambulatory Visit: Payer: Medicare Other | Admitting: Anesthesiology

## 2016-11-27 ENCOUNTER — Encounter: Payer: Self-pay | Admitting: *Deleted

## 2016-11-27 DIAGNOSIS — E669 Obesity, unspecified: Secondary | ICD-10-CM | POA: Diagnosis not present

## 2016-11-27 DIAGNOSIS — I2510 Atherosclerotic heart disease of native coronary artery without angina pectoris: Secondary | ICD-10-CM | POA: Diagnosis not present

## 2016-11-27 DIAGNOSIS — Z1211 Encounter for screening for malignant neoplasm of colon: Secondary | ICD-10-CM | POA: Diagnosis present

## 2016-11-27 DIAGNOSIS — K640 First degree hemorrhoids: Secondary | ICD-10-CM | POA: Diagnosis not present

## 2016-11-27 DIAGNOSIS — Z79899 Other long term (current) drug therapy: Secondary | ICD-10-CM | POA: Diagnosis not present

## 2016-11-27 DIAGNOSIS — K219 Gastro-esophageal reflux disease without esophagitis: Secondary | ICD-10-CM | POA: Insufficient documentation

## 2016-11-27 DIAGNOSIS — K5730 Diverticulosis of large intestine without perforation or abscess without bleeding: Secondary | ICD-10-CM | POA: Diagnosis not present

## 2016-11-27 DIAGNOSIS — K635 Polyp of colon: Secondary | ICD-10-CM | POA: Insufficient documentation

## 2016-11-27 DIAGNOSIS — I10 Essential (primary) hypertension: Secondary | ICD-10-CM | POA: Diagnosis not present

## 2016-11-27 DIAGNOSIS — Z7982 Long term (current) use of aspirin: Secondary | ICD-10-CM | POA: Diagnosis not present

## 2016-11-27 HISTORY — PX: COLONOSCOPY WITH PROPOFOL: SHX5780

## 2016-11-27 SURGERY — COLONOSCOPY WITH PROPOFOL
Anesthesia: General

## 2016-11-27 MED ORDER — PROPOFOL 10 MG/ML IV BOLUS
INTRAVENOUS | Status: AC
  Filled 2016-11-27: qty 20

## 2016-11-27 MED ORDER — FENTANYL CITRATE (PF) 100 MCG/2ML IJ SOLN
INTRAMUSCULAR | Status: DC | PRN
  Administered 2016-11-27 (×2): 50 ug via INTRAVENOUS

## 2016-11-27 MED ORDER — FENTANYL CITRATE (PF) 100 MCG/2ML IJ SOLN
INTRAMUSCULAR | Status: AC
  Filled 2016-11-27: qty 2

## 2016-11-27 MED ORDER — PROPOFOL 500 MG/50ML IV EMUL
INTRAVENOUS | Status: DC | PRN
  Administered 2016-11-27: 120 ug/kg/min via INTRAVENOUS

## 2016-11-27 MED ORDER — LIDOCAINE HCL (PF) 2 % IJ SOLN
INTRAMUSCULAR | Status: AC
  Filled 2016-11-27: qty 10

## 2016-11-27 MED ORDER — PROPOFOL 10 MG/ML IV BOLUS
INTRAVENOUS | Status: DC | PRN
  Administered 2016-11-27: 20 mg via INTRAVENOUS

## 2016-11-27 MED ORDER — LIDOCAINE HCL (CARDIAC) 20 MG/ML IV SOLN
INTRAVENOUS | Status: DC | PRN
  Administered 2016-11-27: 2 mL via INTRAVENOUS

## 2016-11-27 MED ORDER — SODIUM CHLORIDE 0.9 % IV SOLN
INTRAVENOUS | Status: DC
  Administered 2016-11-27: 13:00:00 via INTRAVENOUS

## 2016-11-27 NOTE — Anesthesia Preprocedure Evaluation (Signed)
Anesthesia Evaluation  Patient identified by MRN, date of birth, ID band Patient awake    Reviewed: Allergy & Precautions, NPO status , Patient's Chart, lab work & pertinent test results  Airway Mallampati: III       Dental  (+) Teeth Intact   Pulmonary neg pulmonary ROS,    breath sounds clear to auscultation       Cardiovascular Exercise Tolerance: Good hypertension, Pt. on medications + CAD and + Cardiac Stents   Rhythm:Regular Rate:Normal     Neuro/Psych negative neurological ROS     GI/Hepatic Neg liver ROS, GERD  Medicated,  Endo/Other  negative endocrine ROS  Renal/GU negative Renal ROS     Musculoskeletal   Abdominal (+) + obese,   Peds negative pediatric ROS (+)  Hematology negative hematology ROS (+)   Anesthesia Other Findings   Reproductive/Obstetrics                             Anesthesia Physical Anesthesia Plan  ASA: III  Anesthesia Plan: General   Post-op Pain Management:    Induction: Intravenous  PONV Risk Score and Plan: 3 and 0  Airway Management Planned: Natural Airway and Nasal Cannula  Additional Equipment:   Intra-op Plan:   Post-operative Plan:   Informed Consent: I have reviewed the patients History and Physical, chart, labs and discussed the procedure including the risks, benefits and alternatives for the proposed anesthesia with the patient or authorized representative who has indicated his/her understanding and acceptance.     Plan Discussed with: Surgeon  Anesthesia Plan Comments:         Anesthesia Quick Evaluation

## 2016-11-27 NOTE — Op Note (Signed)
[facility] Gastroenterology Patient Name: Katelyn Crawford Procedure Date: 11/27/2016 2:18 PM MRN: [id number] Account #: [id number] Date of Birth: Mar 28, 1940 Admit Type: Outpatient Age: 76 Room: [facility] ENDO ROOM 4 Gender: Female Note Status: Finalized Procedure:            Colonoscopy Indications:          Screening for colorectal malignant neoplasm Providers:            Boykin Nearing. Norma Fredrickson MD, MD Referring MD:         Duane Lope. Judithann Sheen, MD (Referring MD) Medicines:            Propofol per Anesthesia Complications:        No immediate complications. Procedure:            Pre-Anesthesia Assessment:                       - The risks and benefits of the procedure and the                        sedation options and risks were discussed with the                        patient. All questions were answered and informed                        consent was obtained.                       - Patient identification and proposed procedure were                        verified prior to the procedure by the nurse. The                        procedure was verified in the procedure room.                       - ASA Grade Assessment: III - A patient with severe                        systemic disease.                       - After reviewing the risks and benefits, the patient                        was deemed in satisfactory condition to undergo the                        procedure.                       After obtaining informed consent, the colonoscope was                        passed under direct vision. Throughout the procedure,                        the patient's blood pressure, pulse, and oxygen  saturations were monitored continuously. The                        Colonoscope was introduced through the anus and                        advanced to the the cecum, identified by appendiceal                        orifice and ileocecal valve. The colonoscopy was                  performed without difficulty. The patient tolerated the                        procedure well. The quality of the bowel preparation                        was good. The ileocecal valve, appendiceal orifice, and                        rectum were photographed. Findings:      The perianal and digital rectal examinations were normal.      Many small-mouthed diverticula were found in the left colon. There was       no evidence of diverticular bleeding.      A 3 mm polyp was found in the cecum. The polyp was sessile. The polyp       was removed with a jumbo cold forceps. Resection and retrieval were       complete.      Non-bleeding internal hemorrhoids were found during retroflexion. The       hemorrhoids were Grade I (internal hemorrhoids that do not prolapse).      The exam was otherwise without abnormality. Impression:           - Moderate diverticulosis in the left colon. There was                        no evidence of diverticular bleeding.                       - One 3 mm polyp in the cecum, removed with a jumbo                        cold forceps. Resected and retrieved.                       - Non-bleeding internal hemorrhoids.                       - The examination was otherwise normal. Recommendation:       - Patient has a contact number available for                        emergencies. The signs and symptoms of potential                        delayed complications were discussed with the patient.                        Return to normal activities tomorrow.  Written discharge                        instructions were provided to the patient.                       - Resume previous diet.                       - Continue present medications.                       - Telephone GI clinic for pathology results in 2 weeks.                       - If path is benign adenoma or less, would advise NO                        FURTHER COLONOSCOPY DUE TO ADVANCED AGE.                        - The findings and recommendations were discussed with                        the patient and their family. Procedure Code(s):    --- Professional ---                       [id number], Colonoscopy, flexible; with biopsy, single or                        multiple Diagnosis Code(s):    --- Professional ---                       Z12.11, Encounter for screening for malignant neoplasm                        of colon                       K64.0, First degree hemorrhoids                       D12.0, Benign neoplasm of cecum                       K57.30, Diverticulosis of large intestine without                        perforation or abscess without bleeding CPT copyright 2016 American [employer] Association. All rights reserved. The codes documented in this report are preliminary and upon coder review may  be revised to meet current compliance requirements. Stanton Kidney MD, MD 11/27/2016 2:44:07 PM This report has been signed electronically. Number of Addenda: 0 Note Initiated On: 11/27/2016 2:18 PM Scope Withdrawal Time: 0 hours 6 minutes 52 seconds  Total Procedure Duration: 0 hours 16 minutes 41 seconds       [facility]

## 2016-11-27 NOTE — Transfer of Care (Signed)
Immediate Anesthesia Transfer of Care Note  Patient: Katelyn Crawford  Procedure(s) Performed: COLONOSCOPY WITH PROPOFOL (N/A )  Patient Location: PACU  Anesthesia Type:General  Level of Consciousness: awake  Airway & Oxygen Therapy: Patient Spontanous Breathing and Patient connected to nasal cannula oxygen  Post-op Assessment: Report given to RN and Post -op Vital signs reviewed and stable  Post vital signs: Reviewed  Last Vitals:  Vitals:   11/27/16 1221  BP: (!) 178/76  Pulse: (!) 54  Resp: 16  Temp: (!) 36.4 C  SpO2: 100%    Last Pain:  Vitals:   11/27/16 1221  TempSrc: Tympanic         Complications: No apparent anesthesia complications

## 2016-11-27 NOTE — Anesthesia Postprocedure Evaluation (Signed)
Anesthesia Post Note  Patient: Katelyn Crawford  Procedure(s) Performed: COLONOSCOPY WITH PROPOFOL (N/A )  Patient location during evaluation: PACU Anesthesia Type: General Level of consciousness: awake Pain management: pain level controlled Vital Signs Assessment: post-procedure vital signs reviewed and stable Respiratory status: nonlabored ventilation Anesthetic complications: no     Last Vitals:  Vitals:   11/27/16 1444 11/27/16 1454  BP: (!) 119/56 (!) 143/67  Pulse: (!) 59 62  Resp: 14 16  Temp: (!) 36.2 C   SpO2: 98% 98%    Last Pain:  Vitals:   11/27/16 1444  TempSrc: Tympanic                 VAN STAVEREN,Laquincy Eastridge

## 2016-11-27 NOTE — Anesthesia Post-op Follow-up Note (Signed)
Anesthesia QCDR form completed.        

## 2016-11-29 ENCOUNTER — Encounter: Payer: Self-pay | Admitting: Internal Medicine

## 2016-11-29 LAB — SURGICAL PATHOLOGY

## 2016-12-06 NOTE — H&P (Signed)
Outpatient short stay form Pre-procedure 12/06/2016 8:34 AM Katelyn Crawford, M.D.  Primary Physician: N/A  Reason for visit:  Colon cancer screening. HX of Crohn's disease.  History of present illness:  Patient with reported Crohn's presents for screening/surveillance. Patient denies abdominal pain, rectal bleeding or weight loss.    No current facility-administered medications for this encounter.   Current Outpatient Medications:  .  aspirin 81 MG tablet, Take 81 mg by mouth daily., Disp: , Rfl:  .  atorvastatin (LIPITOR) 40 MG tablet, Take 40 mg by mouth daily., Disp: , Rfl:  .  carvedilol (COREG) 6.25 MG tablet, Take 6.25 mg by mouth 2 (two) times daily with a meal., Disp: , Rfl:  .  cetirizine (ZYRTEC) 10 MG tablet, Take 10 mg by mouth at bedtime., Disp: , Rfl:  .  Cholecalciferol (VITAMIN D-3 PO), Take 1,000 Int'l Units by mouth daily., Disp: , Rfl:  .  clopidogrel (PLAVIX) 75 MG tablet, Take 75 mg by mouth daily., Disp: , Rfl:  .  omeprazole (PRILOSEC) 20 MG capsule, Take 20 mg by mouth daily as needed. , Disp: , Rfl:  .  sertraline (ZOLOFT) 25 MG tablet, Take 25 mg by mouth at bedtime. , Disp: , Rfl:  .  B Complex-C (SUPER B COMPLEX PO), Take 1 capsule by mouth daily., Disp: , Rfl:  .  CALCIUM CARBONATE-VITAMIN D PO, Take by mouth., Disp: , Rfl:  .  COENZYME Q10-OMEGA 3 FATTY ACD PO, Take 2 tablets by mouth daily., Disp: , Rfl:  .  HYDROcodone-acetaminophen (NORCO) 5-325 MG tablet, Take 1-2 tablets by mouth every 4 (four) hours as needed for moderate pain., Disp: 60 tablet, Rfl: 0 .  Multiple Minerals (CALCIUM/MAGNESIUM/ZINC PO), Take by mouth., Disp: , Rfl:  .  Multiple Vitamins-Minerals (MULTIVITAMIN ADULT) TABS, Take by mouth., Disp: , Rfl:  .  vitamin C (ASCORBIC ACID) 500 MG tablet, Take 500 mg by mouth daily., Disp: , Rfl:   No medications prior to admission.     Allergies  Allergen Reactions  . Penicillins   . Sulfa Antibiotics      Past Medical History:   Diagnosis Date  . Coronary artery disease   . GERD (gastroesophageal reflux disease)   . Heart disease   . Hypertension   . Vascular disease     Review of systems:      Physical Exam  General appearance: alert and cooperative Resp: clear to auscultation bilaterally Cardio: regular rate and rhythm, S1, S2 normal, no murmur, click, rub or gallop GI: soft, non-tender; bowel sounds normal; no masses,  no organomegaly     Planned procedures: Colonoscopy. The patient understands the nature of the planned procedure, indications, risks, alternatives and potential complications including but not limited to bleeding, infection, perforation, damage to internal organs and possible oversedation/side effects from anesthesia. The patient agrees and gives consent to proceed.  Please refer to procedure notes for findings, recommendations and patient disposition/instructions.    Katelyn Crawford, M.D. Gastroenterology 12/06/2016  8:34 AM

## 2016-12-15 ENCOUNTER — Encounter: Payer: Self-pay | Admitting: Emergency Medicine

## 2016-12-15 ENCOUNTER — Ambulatory Visit: Payer: Medicare Other

## 2016-12-15 ENCOUNTER — Ambulatory Visit
Admission: EM | Admit: 2016-12-15 | Discharge: 2016-12-15 | Disposition: A | Discharge status: Home / Self Care | Payer: Medicare Other | Attending: Emergency Medicine | Admitting: Emergency Medicine

## 2016-12-15 DIAGNOSIS — B9789 Other viral agents as the cause of diseases classified elsewhere: Secondary | ICD-10-CM | POA: Diagnosis not present

## 2016-12-15 DIAGNOSIS — I10 Essential (primary) hypertension: Secondary | ICD-10-CM | POA: Diagnosis not present

## 2016-12-15 DIAGNOSIS — J069 Acute upper respiratory infection, unspecified: Secondary | ICD-10-CM | POA: Diagnosis not present

## 2016-12-15 DIAGNOSIS — R05 Cough: Secondary | ICD-10-CM | POA: Insufficient documentation

## 2016-12-15 DIAGNOSIS — Z7902 Long term (current) use of antithrombotics/antiplatelets: Secondary | ICD-10-CM | POA: Insufficient documentation

## 2016-12-15 DIAGNOSIS — K219 Gastro-esophageal reflux disease without esophagitis: Secondary | ICD-10-CM | POA: Diagnosis not present

## 2016-12-15 DIAGNOSIS — Z880 Allergy status to penicillin: Secondary | ICD-10-CM | POA: Insufficient documentation

## 2016-12-15 DIAGNOSIS — I2510 Atherosclerotic heart disease of native coronary artery without angina pectoris: Secondary | ICD-10-CM | POA: Insufficient documentation

## 2016-12-15 DIAGNOSIS — R0989 Other specified symptoms and signs involving the circulatory and respiratory systems: Secondary | ICD-10-CM | POA: Diagnosis present

## 2016-12-15 DIAGNOSIS — Z7982 Long term (current) use of aspirin: Secondary | ICD-10-CM | POA: Insufficient documentation

## 2016-12-15 DIAGNOSIS — R062 Wheezing: Secondary | ICD-10-CM | POA: Diagnosis present

## 2016-12-15 MED ORDER — BENZONATATE 200 MG PO CAPS: 200.0000 mg | ORAL_CAPSULE | Freq: Three times a day (TID) | ORAL | 0 refills | Status: DC | PRN

## 2016-12-15 MED ORDER — ALBUTEROL SULFATE HFA 108 (90 BASE) MCG/ACT IN AERS: 1.0000 | INHALATION_SPRAY | Freq: Four times a day (QID) | RESPIRATORY_TRACT | 0 refills | Status: DC | PRN

## 2016-12-15 MED ORDER — FLUTICASONE PROPIONATE 50 MCG/ACT NA SUSP
2.0000 | Freq: Every day | NASAL | 0 refills | Status: AC
Start: 2016-12-15 — End: ?

## 2016-12-15 MED ORDER — AEROCHAMBER PLUS MISC: 2 refills | Status: DC

## 2016-12-15 NOTE — ED Triage Notes (Signed)
Productive cough, (yellow phlegm) headache for 4 days

## 2016-12-15 NOTE — Discharge Instructions (Signed)
Take the medication as written. Flonase nasal spray. Mucinex for congestion, 1-2 puffs from your albuterol inhaler every 4-6 hours as needed for wheezing, cough. Take 1 gram of tylenol up to 3-4 times a day as needed for pain and fever. Drink extra fluids. Use a neti pot or the NeilMed sinus rinse as often as you want to to reduce nasal congestion. Follow the directions on the box.  Go to www.goodrx.com to look up your medications. This will give you a list of where you can find your prescriptions at the most affordable prices. Or ask the pharmacist what the cash price is, or if they have any other discount programs available to help make your medication more affordable. This can be less expensive than what you would pay with insurance.

## 2016-12-15 NOTE — ED Provider Notes (Signed)
HPI  SUBJECTIVE:  Katelyn Crawford is a 76 y.o. female who presents with 3 days of cough productive of dark yellow mucus in the morning, chest congestion, nasal congestion, rhinorrhea, postnasal drip.  She reports frontal sinus pain and pressure and achy upper back pain from the coughing.  She reports some wheezing.  She has been taking thousand milligrams of Tylenol every 6 hours and has tried Zyrtec without much improvement in her symptoms.  No aggravating factors.  Sinus pain and pressure is not associated with bending forward or lying down.  She denies fevers, chest pain, change in her baseline shortness of breath.  She does have a nuclear stress test scheduled on 11/27 for dyspnea.  No upper dental pain, facial swelling.  She is not coughing worse at night.  She got a flu shot this year.  She has a past medical history of hypertension, coronary disease status post stents, questionable history of atrial fibrillation.  States that she is no longer on Plavix due to scheduled surgery in January.  No history of CHF, MI, asthma, emphysema, COPD, smoking, pneumonia, diabetes.  PMD: PCP.  She has follow-up with him on Thursday.    Past Medical History:  Diagnosis Date  . Coronary artery disease   . GERD (gastroesophageal reflux disease)   . Heart disease   . Hypertension   . Vascular disease     Past Surgical History:  Procedure Laterality Date  . APPENDECTOMY    . BREAST EXCISIONAL BIOPSY Left 1984   neg  . BREAST LUMPECTOMY  [id number]  . COLONOSCOPY WITH PROPOFOL N/A 11/27/2016   Procedure: COLONOSCOPY WITH PROPOFOL;  Surgeon: Toledo, Boykin Nearing, MD;  Location: [facility] ENDOSCOPY;  Service: Gastroenterology;  Laterality: N/A;  . CORONARY ANGIOPLASTY WITH STENT PLACEMENT    . KNEE ARTHROSCOPY Left 02/01/2015   Procedure: ARTHROSCOPY KNEE, partial medial & lateral menisectomy,,medial and patelofemoral chondroplasty;  Surgeon: Donato Heinz, MD;  Location: [facility] ORS;  Service: Orthopedics;  Laterality:  Left;  . TUBAL LIGATION    . VARICOSE VEIN SURGERY      Family History  Problem Relation Age of Onset  . Breast cancer Neg Hx     Social History   Tobacco Use  . Smoking status: Never Smoker  . Smokeless tobacco: Never Used  Substance Use Topics  . Alcohol use: No  . Drug use: No    No current facility-administered medications for this encounter.   Current Outpatient Medications:  .  aspirin 81 MG tablet, Take 81 mg by mouth daily., Disp: , Rfl:  .  atorvastatin (LIPITOR) 40 MG tablet, Take 40 mg by mouth daily., Disp: , Rfl:  .  B Complex-C (SUPER B COMPLEX PO), Take 1 capsule by mouth daily., Disp: , Rfl:  .  CALCIUM CARBONATE-VITAMIN D PO, Take by mouth., Disp: , Rfl:  .  Cholecalciferol (VITAMIN D-3 PO), Take 1,000 Int'l Units by mouth daily., Disp: , Rfl:  .  COENZYME Q10-OMEGA 3 FATTY ACD PO, Take 2 tablets by mouth daily., Disp: , Rfl:  .  Multiple Minerals (CALCIUM/MAGNESIUM/ZINC PO), Take by mouth., Disp: , Rfl:  .  Multiple Vitamins-Minerals (MULTIVITAMIN ADULT) TABS, Take by mouth., Disp: , Rfl:  .  omeprazole (PRILOSEC) 20 MG capsule, Take 20 mg by mouth daily as needed. , Disp: , Rfl:  .  sertraline (ZOLOFT) 25 MG tablet, Take 25 mg by mouth at bedtime. , Disp: , Rfl:  .  vitamin C (ASCORBIC ACID) 500 MG tablet, Take 500  mg by mouth daily., Disp: , Rfl:  .  albuterol (PROVENTIL HFA;VENTOLIN HFA) 108 (90 Base) MCG/ACT inhaler, Inhale 1-2 puffs into the lungs every 6 (six) hours as needed for wheezing or shortness of breath., Disp: 1 Inhaler, Rfl: 0 .  benzonatate (TESSALON) 200 MG capsule, Take 1 capsule (200 mg total) by mouth 3 (three) times daily as needed for cough., Disp: 30 capsule, Rfl: 0 .  carvedilol (COREG) 6.25 MG tablet, Take 6.25 mg by mouth 2 (two) times daily with a meal., Disp: , Rfl:  .  clopidogrel (PLAVIX) 75 MG tablet, Take 75 mg by mouth daily., Disp: , Rfl:  .  fluticasone (FLONASE) 50 MCG/ACT nasal spray, Place 2 sprays into both nostrils  daily., Disp: 16 g, Rfl: 0 .  Spacer/Aero-Holding Chambers (AEROCHAMBER PLUS) inhaler, Use as instructed, Disp: 1 each, Rfl: 2  Allergies  Allergen Reactions  . Penicillins   . Sulfa Antibiotics      ROS  As noted in HPI.   Physical Exam  BP (!) 150/75 (BP Location: Left Arm)   Pulse 68   Temp 97.9 F (36.6 C) (Oral)   Resp 17   Ht 4\' 11"  (1.499 m)   Wt 154 lb (69.9 kg)   SpO2 97%   BMI 31.10 kg/m   Constitutional: Well developed, well nourished, no acute distress Eyes:  EOMI, conjunctiva normal bilaterally HENT: Normocephalic, atraumatic,mucus membranes moist scant nasal congestion.  No sinus tenderness.  Mild postnasal drip. Respiratory: Normal inspiratory effort good air movement, lungs clear bilaterally.  Mild chest wall tenderness. Cardiovascular: Normal rate GI: nondistended skin: No rash, skin intact Musculoskeletal: no deformities Neurologic: Alert & oriented x 3, no focal neuro deficits Psychiatric: Speech and behavior appropriate   ED Course   Medications - No data to display  Orders Placed This Encounter  Procedures  . DG Chest 2 View    Standing Status:   Standing    Number of Occurrences:   1    Order Specific Question:   Reason for Exam (SYMPTOM  OR DIAGNOSIS REQUIRED)    Answer:   r/o PNA effusion pulm edema    No results found for this or any previous visit (from the past 24 hour(s)). Dg Chest 2 View  Result Date: 12/15/2016 CLINICAL DATA:  Productive cough for 1 week. EXAM: CHEST  2 VIEW COMPARISON:  03/13/2011 rib radiographs FINDINGS: The cardiac silhouette is mildly to moderately enlarged. Thoracic aortic atherosclerotic calcification and tortuosity are noted. There is mild anterior eventration of the right hemidiaphragm, and there is increased AP diameter of the chest. No airspace consolidation, edema, pleural effusion, pneumothorax is identified. Thoracic spondylosis and thoracolumbar scoliosis are noted. IMPRESSION: No active  cardiopulmonary disease. Electronically Signed   By: Sebastian Ache M.D.   On: 12/15/2016 12:36    ED Clinical Impression  Viral URI with cough   ED Assessment/Plan  Reviewed imaging independently.  No pneumonia or acute cardiopulmonary disease see radiology report for full details.  Presentation consistent with URI.  She does not have any evidence of pneumonia on x-ray she has no focal lung findings on exam.  Satting well on room air.  Afebrile, but took Tylenol within 6-8 hours of evaluation.  Will send home with saline nasal irrigation, Flonase, Tessalon, Mucinex, 1-2 puffs from an albuterol inhaler using her spacer every 4-6 hours as needed for coughing, wheezing.  She has follow-up scheduled with her primary care physician in 4 days.  May also return here as needed.  Gave strict ER return precautions.  Discussed  imaging, MDM, plan and followup with patient. Discussed sn/sx that should prompt return to the ED. patient agrees with plan.   Meds ordered this encounter  Medications  . fluticasone (FLONASE) 50 MCG/ACT nasal spray    Sig: Place 2 sprays into both nostrils daily.    Dispense:  16 g    Refill:  0  . albuterol (PROVENTIL HFA;VENTOLIN HFA) 108 (90 Base) MCG/ACT inhaler    Sig: Inhale 1-2 puffs into the lungs every 6 (six) hours as needed for wheezing or shortness of breath.    Dispense:  1 Inhaler    Refill:  0  . Spacer/Aero-Holding Chambers (AEROCHAMBER PLUS) inhaler    Sig: Use as instructed    Dispense:  1 each    Refill:  2  . benzonatate (TESSALON) 200 MG capsule    Sig: Take 1 capsule (200 mg total) by mouth 3 (three) times daily as needed for cough.    Dispense:  30 capsule    Refill:  0    *This clinic note was created using Scientist, clinical (histocompatibility and immunogenetics)Dragon dictation software. Therefore, there may be occasional mistakes despite careful proofreading.   ?   Domenick GongMortenson, Loyalty Arentz, MD 12/15/16 1410

## 2017-01-08 ENCOUNTER — Encounter
Admission: RE | Admit: 2017-01-08 | Discharge: 2017-01-08 | Disposition: A | Discharge status: Home / Self Care | Payer: Medicare Other | Source: Ambulatory Visit | Attending: Orthopedic Surgery | Admitting: Orthopedic Surgery

## 2017-01-08 ENCOUNTER — Other Ambulatory Visit: Payer: Self-pay

## 2017-01-08 DIAGNOSIS — M1990 Unspecified osteoarthritis, unspecified site: Secondary | ICD-10-CM | POA: Diagnosis not present

## 2017-01-08 DIAGNOSIS — I2510 Atherosclerotic heart disease of native coronary artery without angina pectoris: Secondary | ICD-10-CM | POA: Insufficient documentation

## 2017-01-08 DIAGNOSIS — I10 Essential (primary) hypertension: Secondary | ICD-10-CM | POA: Diagnosis present

## 2017-01-08 DIAGNOSIS — K219 Gastro-esophageal reflux disease without esophagitis: Secondary | ICD-10-CM | POA: Insufficient documentation

## 2017-01-08 DIAGNOSIS — Z01812 Encounter for preprocedural laboratory examination: Secondary | ICD-10-CM | POA: Diagnosis present

## 2017-01-08 DIAGNOSIS — I999 Unspecified disorder of circulatory system: Secondary | ICD-10-CM | POA: Diagnosis not present

## 2017-01-08 HISTORY — DX: Personal history of other diseases of the digestive system: Z87.19

## 2017-01-08 HISTORY — DX: Personal history of other specified conditions: Z87.898

## 2017-01-08 HISTORY — DX: Personal history of urinary calculi: Z87.442

## 2017-01-08 HISTORY — DX: Unspecified osteoarthritis, unspecified site: M19.90

## 2017-01-08 HISTORY — DX: Allergy status to unspecified drugs, medicaments and biological substances: Z88.9

## 2017-01-08 LAB — COMPREHENSIVE METABOLIC PANEL
ALK PHOS: 82 U/L (ref 38–126)
ALT: 24 U/L (ref 14–54)
ANION GAP: 7 (ref 5–15)
AST: 32 U/L (ref 15–41)
Albumin: 4.2 g/dL (ref 3.5–5.0)
BILIRUBIN TOTAL: 0.6 mg/dL (ref 0.3–1.2)
BUN: 20 mg/dL (ref 6–20)
CALCIUM: 9.5 mg/dL (ref 8.9–10.3)
CO2: 26 mmol/L (ref 22–32)
CREATININE: 0.89 mg/dL (ref 0.44–1.00)
Chloride: 101 mmol/L (ref 101–111)
Glucose, Bld: 99 mg/dL (ref 65–99)
Potassium: 4.5 mmol/L (ref 3.5–5.1)
SODIUM: 134 mmol/L — AB (ref 135–145)
TOTAL PROTEIN: 7.1 g/dL (ref 6.5–8.1)

## 2017-01-08 LAB — SURGICAL PCR SCREEN
MRSA, PCR: NEGATIVE
STAPHYLOCOCCUS AUREUS: POSITIVE — AB

## 2017-01-08 LAB — URINALYSIS, ROUTINE W REFLEX MICROSCOPIC
Bilirubin Urine: NEGATIVE
GLUCOSE, UA: NEGATIVE mg/dL
Hgb urine dipstick: NEGATIVE
KETONES UR: NEGATIVE mg/dL
LEUKOCYTES UA: NEGATIVE
Nitrite: NEGATIVE
PROTEIN: NEGATIVE mg/dL
Specific Gravity, Urine: 1.006 (ref 1.005–1.030)
pH: 7 (ref 5.0–8.0)

## 2017-01-08 LAB — CBC
HCT: 36.5 % (ref 35.0–47.0)
HEMOGLOBIN: 12.2 g/dL (ref 12.0–16.0)
MCH: 28 pg (ref 26.0–34.0)
MCHC: 33.3 g/dL (ref 32.0–36.0)
MCV: 84 fL (ref 80.0–100.0)
Platelets: 200 10*3/uL (ref 150–440)
RBC: 4.34 MIL/uL (ref 3.80–5.20)
RDW: 14.5 % (ref 11.5–14.5)
WBC: 3.9 10*3/uL (ref 3.6–11.0)

## 2017-01-08 LAB — APTT: aPTT: 33 seconds (ref 24–36)

## 2017-01-08 LAB — TYPE AND SCREEN
ABO/RH(D): O POS
Antibody Screen: NEGATIVE

## 2017-01-08 LAB — SEDIMENTATION RATE: Sed Rate: 46 mm/hr — ABNORMAL HIGH (ref 0–30)

## 2017-01-08 LAB — PROTIME-INR
INR: 0.96
PROTHROMBIN TIME: 12.7 s (ref 11.4–15.2)

## 2017-01-08 NOTE — Patient Instructions (Signed)
Your procedure is scheduled on:January 22, 2017 [facility] ) Report to Same Day Surgery.(MEDICAL MALL ) SECOND FLOOR To find out your arrival time please call [phone] between 1PM - 3PM on January 20, 2017 (MONDAY)  Remember: Instructions that are not followed completely may result in serious medical risk, up to and including death, or upon the discretion of your surgeon and anesthesiologist your surgery may need to be rescheduled.     _X__ 1. Do not eat food after midnight the night before your procedure.                 No gum chewing or hard candies. You may drink clear liquids up to 2 hours                 before you are scheduled to arrive for your surgery- DO not drink clear                 liquids within 2 hours of the start of your surgery.                 Clear Liquids include:  water, apple juice without pulp, clear carbohydrate                 drink such as Clearfast of Gartorade, Black Coffee or Tea (Do not add                 anything to coffee or tea).     _X__ 2.  No Alcohol for 24 hours before or after surgery.   _X__ 3.  Do Not Smoke or use e-cigarettes For 24 Hours Prior to Your Surgery.                 Do not use any chewable tobacco products for at least 6 hours prior to                 surgery.  ____  4.  Bring all medications with you on the day of surgery if instructed.   __X_  5.  Notify your doctor if there is any change in your medical condition      (cold, fever, infections).     Do not wear jewelry, make-up, hairpins, clips or nail polish. Do not wear lotions, powders, or perfumes. Do not shave 48 hours prior to surgery. Men may shave face and neck. Do not bring valuables to the hospital.    [facility] is not responsible for any belongings or valuables.  Contacts, dentures or bridgework may not be worn into surgery. Leave your suitcase in the car. After surgery it may be brought to your room. For patients admitted to the  hospital, discharge time is determined by your treatment team.   Patients discharged the day of surgery will not be allowed to drive home.   Please read over the following fact sheets that you were given:   MRSA Information          _X__ Take these medicines the morning of surgery with A SIP OF WATER:    1. OMEPRAZOLE  2.  OMEPRAZOLE AT BEDTIME ON JANUARY  1 )  3.   4.  5.  6.  ____ Fleet Enema (as directed)   __X__ Use CHG Soap as directed  ____ Use inhalers on the day of surgery  ____ Stop metformin 2 days prior to surgery    ____ Take 1/2 of usual insulin dose the night before surgery. No insulin the  morning          of surgery.   _X___ Stop Coumadin/Plavix/aspirin on (STOP ASPIRIN ONE WEEK BEFORE SURGERY )  __X__ Stop Anti-inflammatories on (STOP ASPIRIN PRODUCTS ONE WEEK BEFORE SURGERY ) MAY TAKE TYLENOL FOR PAIN IF NEEDED   __X__ Stop supplements until after surgery.  (STOP OMEGA Q PLUS AND VITAMIN C NOW )  ____ Bring C-Pap to the hospital.

## 2017-01-08 NOTE — Pre-Procedure Instructions (Signed)
Dr. Priscella Mann reviewed pre-op EKG and stated "looks fine. "

## 2017-01-09 LAB — URINE CULTURE
Culture: NO GROWTH
Special Requests: NORMAL

## 2017-01-09 LAB — C-REACTIVE PROTEIN: CRP: <0.8 mg/dL (ref <1.0)

## 2017-01-11 IMAGING — CR DG KNEE COMPLETE 4+V*L*
4 series · 4 of 4 positions shown · non-contrast
Comparison: None.

CLINICAL DATA: Left knee pain for 1 week

EXAM:
LEFT KNEE - COMPLETE 4+ VIEW

[knee ap]
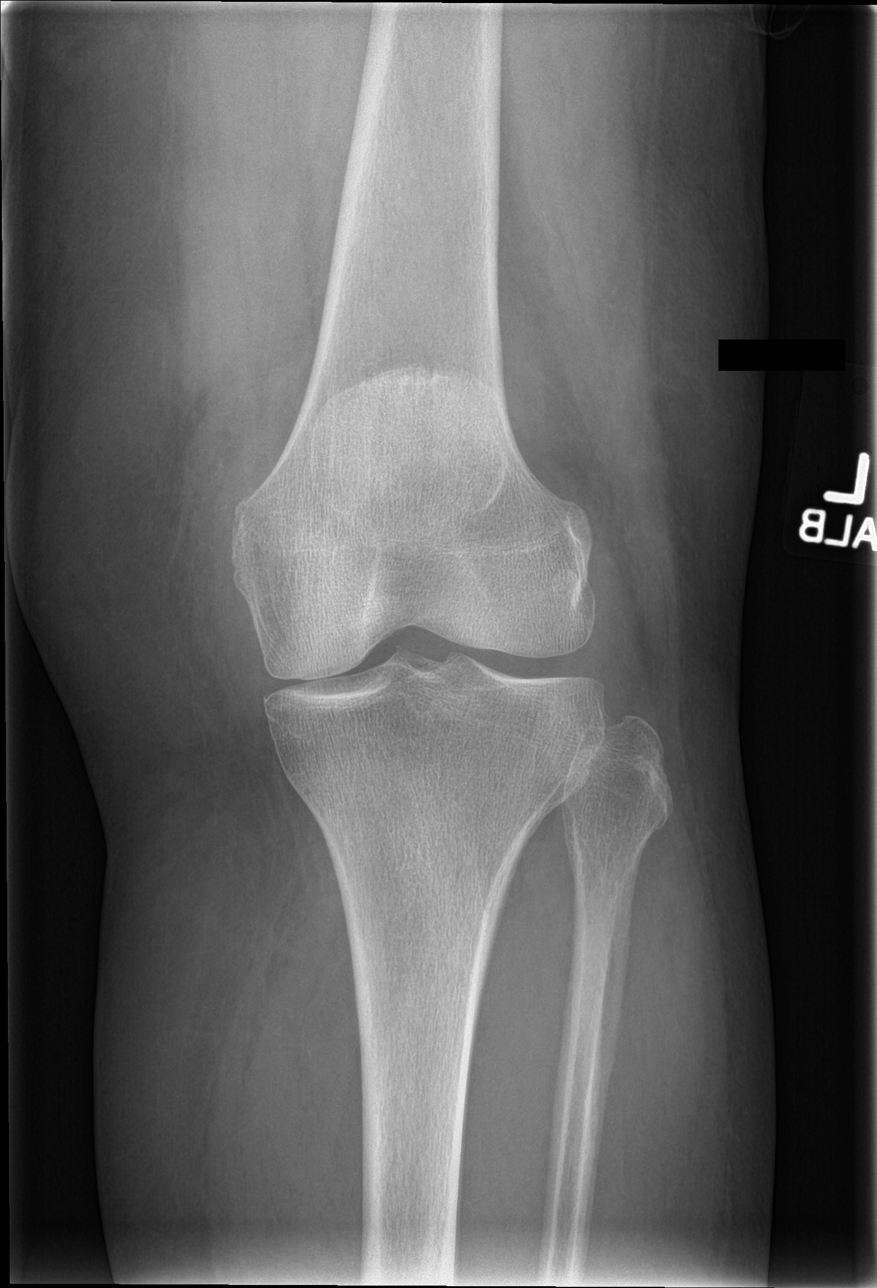

[tunnel]
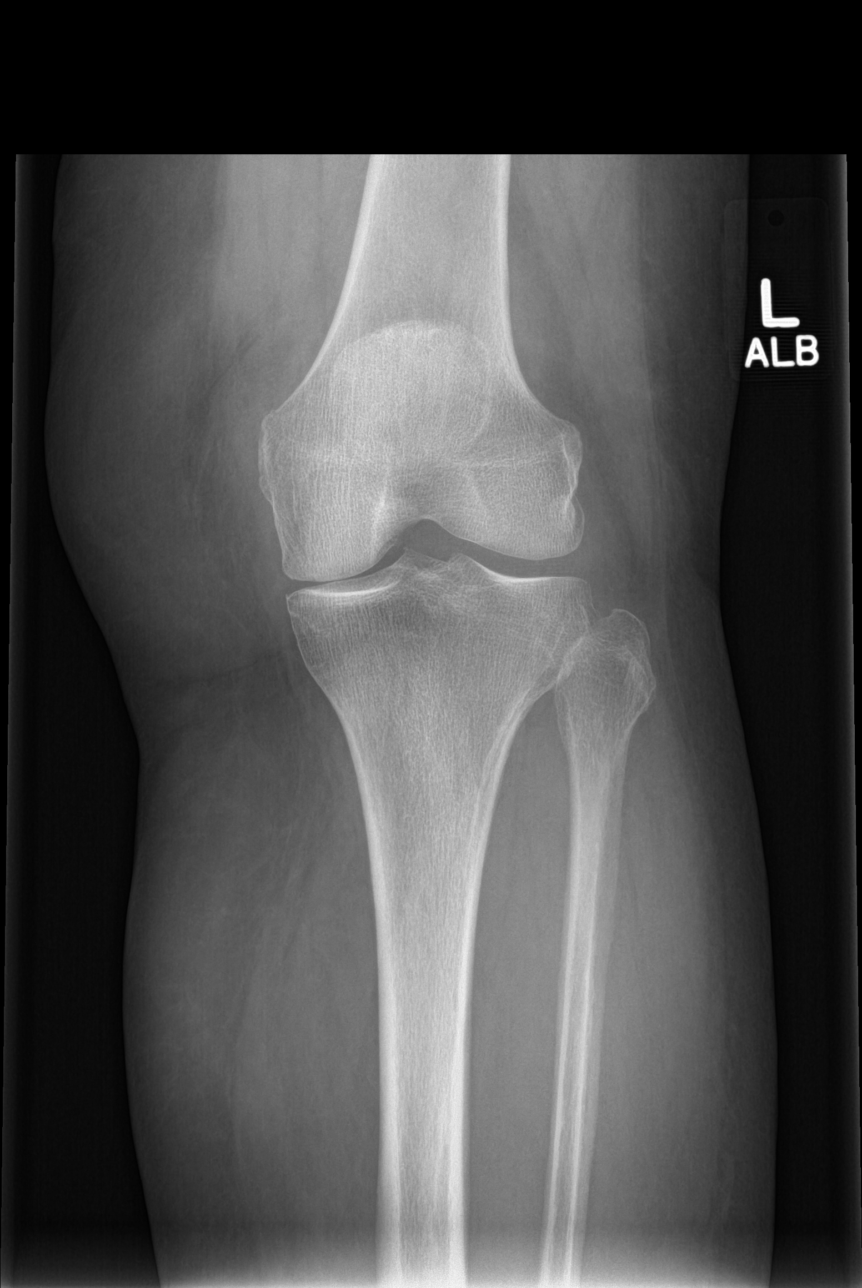

[knee lat]
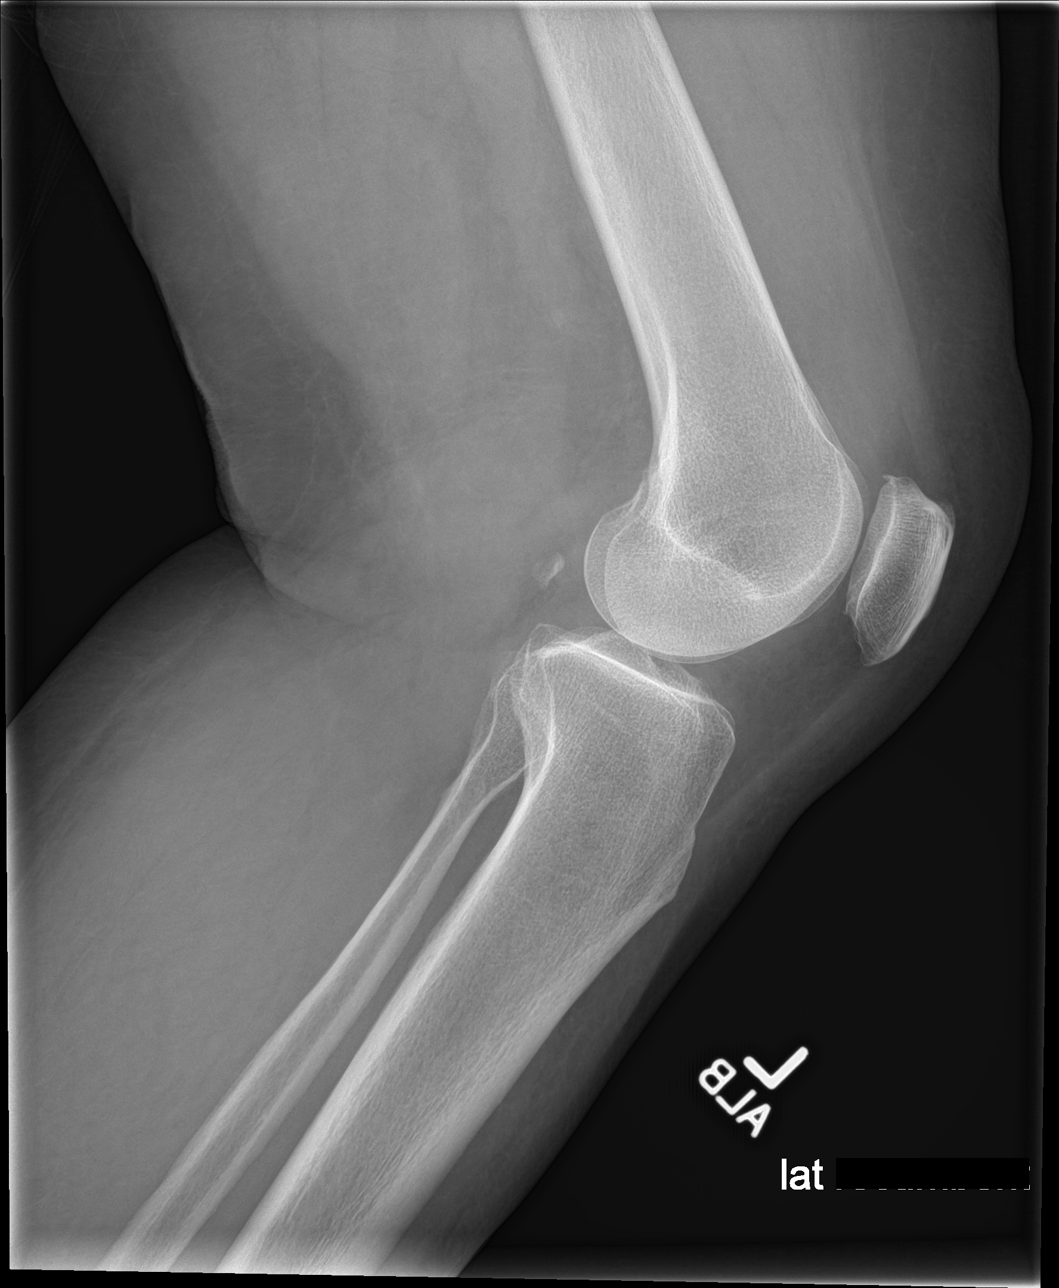

[patella skyline]
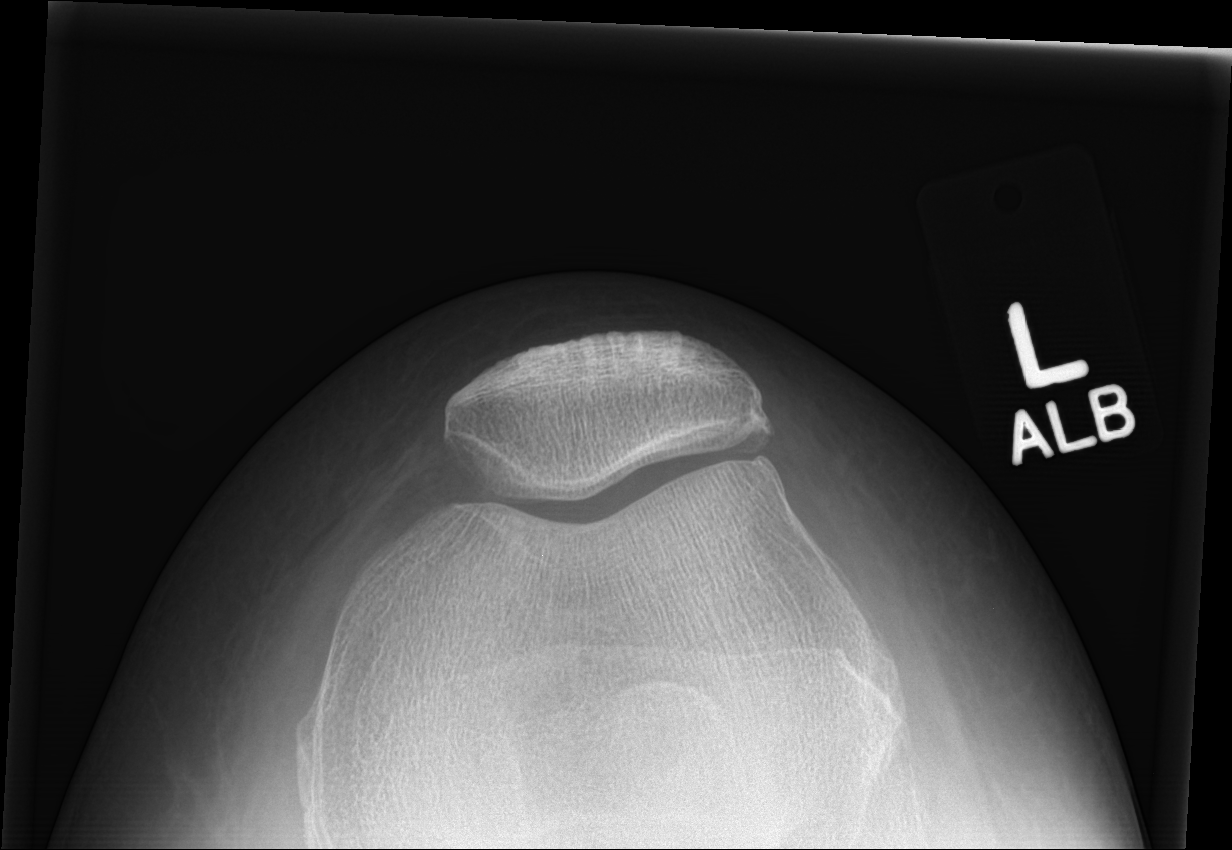

[4 of 4 positions shown; findings below may reference images not displayed]

FINDINGS: There is some loss of medial joint space, with mild degenerative
spurring from the patellofemoral space as well. The lateral joint
space is relatively well preserved. No fracture is seen. However,
there does appear to be a moderate-sized left knee joint effusion
present. The patella is normally positioned.
IMPRESSION: 1. Moderate size left knee joint effusion.
2. Mild bicompartmental degenerative joint disease.

## 2017-01-18 NOTE — Discharge Instructions (Signed)
°  Instructions after Total Knee Replacement ° ° Katelyn Crawford, Jr., M.D.    ° Dept. of Orthopaedics & Sports Medicine ° [facility] ° [address] ° [place], [place]  [address] ° Phone: [phone]   Fax: [phone] ° °  °DIET: °• Drink plenty of non-alcoholic fluids. °• Resume your normal diet. Include foods high in fiber. ° °ACTIVITY:  °• You may use crutches or a walker with weight-bearing as tolerated, unless instructed otherwise. °• You may be weaned off of the walker or crutches by your Physical Therapist.  °• Do NOT place pillows under the knee. Anything placed under the knee could limit your ability to straighten the knee.   °• Continue doing gentle exercises. Exercising will reduce the pain and swelling, increase motion, and prevent muscle weakness.   °• Please continue to use the TED compression stockings for 6 weeks. You may remove the stockings at night, but should reapply them in the morning. °• Do not drive or operate any equipment until instructed. ° °WOUND CARE:  °• Continue to use the PolarCare or ice packs periodically to reduce pain and swelling. °• You may bathe or shower after the staples are removed at the first office visit following surgery. ° °MEDICATIONS: °• You may resume your regular medications. °• Please take the pain medication as prescribed on the medication. °• Do not take pain medication on an empty stomach. °• You have been given a prescription for a blood thinner (Lovenox or Coumadin). Please take the medication as instructed. (NOTE: After completing a 2 week course of Lovenox, take one Enteric-coated aspirin once a day. This along with elevation will help reduce the possibility of phlebitis in your operated leg.) °• Do not drive or drink alcoholic beverages when taking pain medications. ° °CALL THE OFFICE FOR: °• Temperature above 101 degrees °• Excessive bleeding or drainage on the dressing. °• Excessive swelling, coldness, or paleness of the toes. °• Persistent  nausea and vomiting. ° °FOLLOW-UP:  °• You should have an appointment to return to the office in 10-14 days after surgery. °• Arrangements have been made for continuation of Physical Therapy (either home therapy or outpatient therapy). °  °

## 2017-01-21 DIAGNOSIS — Z9842 Cataract extraction status, left eye: Secondary | ICD-10-CM

## 2017-01-21 DIAGNOSIS — Z9841 Cataract extraction status, right eye: Secondary | ICD-10-CM

## 2017-01-21 HISTORY — DX: Cataract extraction status, left eye: Z98.42

## 2017-01-21 HISTORY — DX: Cataract extraction status, right eye: Z98.41

## 2017-01-21 MED ORDER — TRANEXAMIC ACID 1000 MG/10ML IV SOLN
1000.0000 mg | INTRAVENOUS | Status: DC
  Filled 2017-01-21: qty 10

## 2017-01-21 MED ORDER — CLINDAMYCIN PHOSPHATE 900 MG/50ML IV SOLN: 900.0000 mg | INTRAVENOUS | Status: DC

## 2017-01-22 ENCOUNTER — Inpatient Hospital Stay: Payer: Medicare Other

## 2017-01-22 ENCOUNTER — Encounter
Admission: RE | Disposition: A | Discharge status: Skilled Nursing Facility | Payer: Self-pay | Source: Ambulatory Visit | Attending: Orthopedic Surgery

## 2017-01-22 ENCOUNTER — Other Ambulatory Visit: Payer: Self-pay

## 2017-01-22 ENCOUNTER — Inpatient Hospital Stay: Payer: Medicare Other | Admitting: Anesthesiology

## 2017-01-22 ENCOUNTER — Encounter: Payer: Self-pay | Admitting: Orthopedic Surgery

## 2017-01-22 ENCOUNTER — Inpatient Hospital Stay
Admission: RE | Admit: 2017-01-22 | Discharge: 2017-01-25 | DRG: 470 | Disposition: A | Discharge status: Skilled Nursing Facility | Payer: Medicare Other | Source: Ambulatory Visit | Attending: Orthopedic Surgery | Admitting: Orthopedic Surgery

## 2017-01-22 DIAGNOSIS — K5790 Diverticulosis of intestine, part unspecified, without perforation or abscess without bleeding: Secondary | ICD-10-CM | POA: Diagnosis present

## 2017-01-22 DIAGNOSIS — Z880 Allergy status to penicillin: Secondary | ICD-10-CM | POA: Diagnosis not present

## 2017-01-22 DIAGNOSIS — N200 Calculus of kidney: Secondary | ICD-10-CM | POA: Diagnosis present

## 2017-01-22 DIAGNOSIS — Z79899 Other long term (current) drug therapy: Secondary | ICD-10-CM

## 2017-01-22 DIAGNOSIS — Z96659 Presence of unspecified artificial knee joint: Secondary | ICD-10-CM

## 2017-01-22 DIAGNOSIS — I2510 Atherosclerotic heart disease of native coronary artery without angina pectoris: Secondary | ICD-10-CM | POA: Diagnosis present

## 2017-01-22 DIAGNOSIS — I493 Ventricular premature depolarization: Secondary | ICD-10-CM | POA: Insufficient documentation

## 2017-01-22 DIAGNOSIS — M1712 Unilateral primary osteoarthritis, left knee: Secondary | ICD-10-CM | POA: Diagnosis present

## 2017-01-22 DIAGNOSIS — K219 Gastro-esophageal reflux disease without esophagitis: Secondary | ICD-10-CM | POA: Diagnosis present

## 2017-01-22 DIAGNOSIS — I10 Essential (primary) hypertension: Secondary | ICD-10-CM | POA: Diagnosis present

## 2017-01-22 DIAGNOSIS — R42 Dizziness and giddiness: Secondary | ICD-10-CM | POA: Insufficient documentation

## 2017-01-22 DIAGNOSIS — M51369 Other intervertebral disc degeneration, lumbar region without mention of lumbar back pain or lower extremity pain: Secondary | ICD-10-CM | POA: Insufficient documentation

## 2017-01-22 DIAGNOSIS — M810 Age-related osteoporosis without current pathological fracture: Secondary | ICD-10-CM | POA: Insufficient documentation

## 2017-01-22 DIAGNOSIS — R001 Bradycardia, unspecified: Secondary | ICD-10-CM | POA: Insufficient documentation

## 2017-01-22 DIAGNOSIS — Z882 Allergy status to sulfonamides status: Secondary | ICD-10-CM

## 2017-01-22 DIAGNOSIS — M5136 Other intervertebral disc degeneration, lumbar region: Secondary | ICD-10-CM | POA: Insufficient documentation

## 2017-01-22 HISTORY — PX: KNEE ARTHROPLASTY: SHX992

## 2017-01-22 LAB — ABO/RH: ABO/RH(D): O POS

## 2017-01-22 SURGERY — ARTHROPLASTY, KNEE, TOTAL, USING IMAGELESS COMPUTER-ASSISTED NAVIGATION
Anesthesia: General | Laterality: Left

## 2017-01-22 MED ORDER — CLINDAMYCIN PHOSPHATE 600 MG/50ML IV SOLN
600.0000 mg | Freq: Four times a day (QID) | INTRAVENOUS | Status: AC
  Administered 2017-01-22: 600 mg via INTRAVENOUS
  Administered 2017-01-22: 900 mg via INTRAVENOUS
  Administered 2017-01-23 (×2): 600 mg via INTRAVENOUS
  Filled 2017-01-22 (×4): qty 50

## 2017-01-22 MED ORDER — BUPIVACAINE HCL (PF) 0.25 % IJ SOLN
INTRAMUSCULAR | Status: DC | PRN
  Administered 2017-01-22: 60 mL

## 2017-01-22 MED ORDER — ACETAMINOPHEN 650 MG RE SUPP: 650.0000 mg | RECTAL | Status: DC | PRN

## 2017-01-22 MED ORDER — BUPIVACAINE HCL (PF) 0.5 % IJ SOLN
INTRAMUSCULAR | Status: AC
  Filled 2017-01-22: qty 10

## 2017-01-22 MED ORDER — GABAPENTIN 300 MG PO CAPS
300.0000 mg | ORAL_CAPSULE | Freq: Once | ORAL | Status: AC
  Administered 2017-01-22: 300 mg via ORAL

## 2017-01-22 MED ORDER — NEOMYCIN-POLYMYXIN B GU 40-200000 IR SOLN
Status: AC
  Filled 2017-01-22: qty 20

## 2017-01-22 MED ORDER — SERTRALINE HCL 50 MG PO TABS
25.0000 mg | ORAL_TABLET | Freq: Every day | ORAL | Status: DC
  Administered 2017-01-22 – 2017-01-24 (×3): 25 mg via ORAL
  Filled 2017-01-22 (×3): qty 1

## 2017-01-22 MED ORDER — PANTOPRAZOLE SODIUM 40 MG PO TBEC
40.0000 mg | DELAYED_RELEASE_TABLET | Freq: Two times a day (BID) | ORAL | Status: DC
  Administered 2017-01-23 – 2017-01-25 (×5): 40 mg via ORAL
  Filled 2017-01-22 (×6): qty 1

## 2017-01-22 MED ORDER — SODIUM CHLORIDE 0.9 % IV SOLN
INTRAVENOUS | Status: DC
  Administered 2017-01-22 – 2017-01-23 (×2): via INTRAVENOUS

## 2017-01-22 MED ORDER — MIDAZOLAM HCL 5 MG/5ML IJ SOLN
INTRAMUSCULAR | Status: DC | PRN
  Administered 2017-01-22 (×2): 1 mg via INTRAVENOUS

## 2017-01-22 MED ORDER — ROCURONIUM BROMIDE 100 MG/10ML IV SOLN
INTRAVENOUS | Status: DC | PRN
  Administered 2017-01-22: 30 mg via INTRAVENOUS

## 2017-01-22 MED ORDER — BISACODYL 10 MG RE SUPP: 10.0000 mg | Freq: Every day | RECTAL | Status: DC | PRN

## 2017-01-22 MED ORDER — SODIUM CHLORIDE 0.9 % IV SOLN
INTRAVENOUS | Status: DC | PRN
  Administered 2017-01-22: 20 ug/min via INTRAVENOUS

## 2017-01-22 MED ORDER — ATORVASTATIN CALCIUM 20 MG PO TABS
40.0000 mg | ORAL_TABLET | Freq: Every day | ORAL | Status: DC
  Administered 2017-01-23 – 2017-01-24 (×2): 40 mg via ORAL
  Filled 2017-01-22 (×2): qty 2

## 2017-01-22 MED ORDER — ONDANSETRON HCL 4 MG PO TABS: 4.0000 mg | ORAL_TABLET | Freq: Four times a day (QID) | ORAL | Status: DC | PRN

## 2017-01-22 MED ORDER — ALUM & MAG HYDROXIDE-SIMETH 200-200-20 MG/5ML PO SUSP: 30.0000 mL | ORAL | Status: DC | PRN

## 2017-01-22 MED ORDER — TRANEXAMIC ACID 1000 MG/10ML IV SOLN
1000.0000 mg | Freq: Once | INTRAVENOUS | Status: AC
  Administered 2017-01-22: 1000 mg via INTRAVENOUS
  Filled 2017-01-22: qty 10

## 2017-01-22 MED ORDER — VITAMIN C 500 MG PO TABS
500.0000 mg | ORAL_TABLET | Freq: Every day | ORAL | Status: DC
  Administered 2017-01-23 – 2017-01-25 (×3): 500 mg via ORAL
  Filled 2017-01-22 (×4): qty 1

## 2017-01-22 MED ORDER — FENTANYL CITRATE (PF) 100 MCG/2ML IJ SOLN
INTRAMUSCULAR | Status: DC | PRN
  Administered 2017-01-22 (×2): 50 ug via INTRAVENOUS

## 2017-01-22 MED ORDER — VITAMIN D 1000 UNITS PO TABS
1000.0000 [IU] | ORAL_TABLET | Freq: Every day | ORAL | Status: DC
  Administered 2017-01-23 – 2017-01-25 (×3): 1000 [IU] via ORAL
  Filled 2017-01-22 (×3): qty 1

## 2017-01-22 MED ORDER — PROPOFOL 10 MG/ML IV BOLUS
INTRAVENOUS | Status: DC | PRN
  Administered 2017-01-22: 150 mg via INTRAVENOUS

## 2017-01-22 MED ORDER — FENTANYL CITRATE (PF) 100 MCG/2ML IJ SOLN
INTRAMUSCULAR | Status: AC
  Administered 2017-01-22: 25 ug via INTRAVENOUS
  Filled 2017-01-22: qty 2

## 2017-01-22 MED ORDER — PROPOFOL 500 MG/50ML IV EMUL
INTRAVENOUS | Status: AC
  Filled 2017-01-22: qty 50

## 2017-01-22 MED ORDER — DIPHENHYDRAMINE HCL 12.5 MG/5ML PO ELIX: 12.5000 mg | ORAL_SOLUTION | ORAL | Status: DC | PRN

## 2017-01-22 MED ORDER — MORPHINE SULFATE (PF) 2 MG/ML IV SOLN: 2.0000 mg | INTRAVENOUS | Status: DC | PRN

## 2017-01-22 MED ORDER — ONDANSETRON HCL 4 MG/2ML IJ SOLN
INTRAMUSCULAR | Status: DC | PRN
  Administered 2017-01-22: 4 mg via INTRAVENOUS

## 2017-01-22 MED ORDER — DEXAMETHASONE SODIUM PHOSPHATE 10 MG/ML IJ SOLN
8.0000 mg | Freq: Once | INTRAMUSCULAR | Status: AC
  Administered 2017-01-22: 8 mg via INTRAVENOUS

## 2017-01-22 MED ORDER — ACETAMINOPHEN 325 MG PO TABS
650.0000 mg | ORAL_TABLET | ORAL | Status: DC | PRN
  Administered 2017-01-23: 650 mg via ORAL
  Filled 2017-01-22: qty 2

## 2017-01-22 MED ORDER — SODIUM CHLORIDE 0.9 % IJ SOLN
INTRAMUSCULAR | Status: AC
  Filled 2017-01-22: qty 50

## 2017-01-22 MED ORDER — BUPIVACAINE HCL (PF) 0.25 % IJ SOLN
INTRAMUSCULAR | Status: AC
  Filled 2017-01-22: qty 60

## 2017-01-22 MED ORDER — ACETAMINOPHEN 10 MG/ML IV SOLN
1000.0000 mg | Freq: Four times a day (QID) | INTRAVENOUS | Status: AC
  Administered 2017-01-22 – 2017-01-23 (×3): 1000 mg via INTRAVENOUS
  Filled 2017-01-22 (×4): qty 100

## 2017-01-22 MED ORDER — METOCLOPRAMIDE HCL 10 MG PO TABS
10.0000 mg | ORAL_TABLET | Freq: Three times a day (TID) | ORAL | Status: AC
  Administered 2017-01-22 – 2017-01-24 (×5): 10 mg via ORAL
  Filled 2017-01-22 (×6): qty 1

## 2017-01-22 MED ORDER — ENOXAPARIN SODIUM 30 MG/0.3ML ~~LOC~~ SOLN
30.0000 mg | Freq: Two times a day (BID) | SUBCUTANEOUS | Status: DC
  Administered 2017-01-23 – 2017-01-25 (×5): 30 mg via SUBCUTANEOUS
  Filled 2017-01-22 (×5): qty 0.3

## 2017-01-22 MED ORDER — LACTATED RINGERS IV SOLN
INTRAVENOUS | Status: DC
  Administered 2017-01-22 (×2): via INTRAVENOUS

## 2017-01-22 MED ORDER — TRAMADOL HCL 50 MG PO TABS
50.0000 mg | ORAL_TABLET | ORAL | Status: DC | PRN
  Administered 2017-01-23 – 2017-01-24 (×4): 50 mg via ORAL
  Administered 2017-01-25: 100 mg via ORAL
  Filled 2017-01-22: qty 2
  Filled 2017-01-22 (×4): qty 1

## 2017-01-22 MED ORDER — CELECOXIB 200 MG PO CAPS
200.0000 mg | ORAL_CAPSULE | Freq: Two times a day (BID) | ORAL | Status: DC
  Administered 2017-01-22 – 2017-01-25 (×6): 200 mg via ORAL
  Filled 2017-01-22 (×6): qty 1

## 2017-01-22 MED ORDER — PHENYLEPHRINE HCL 10 MG/ML IJ SOLN
INTRAMUSCULAR | Status: AC
Start: 2017-01-22 — End: 2017-01-22
  Filled 2017-01-22: qty 1

## 2017-01-22 MED ORDER — SEVOFLURANE IN SOLN
RESPIRATORY_TRACT | Status: AC
  Filled 2017-01-22: qty 250

## 2017-01-22 MED ORDER — CELECOXIB 200 MG PO CAPS
400.0000 mg | ORAL_CAPSULE | Freq: Once | ORAL | Status: AC
  Administered 2017-01-22: 400 mg via ORAL

## 2017-01-22 MED ORDER — FLEET ENEMA 7-19 GM/118ML RE ENEM: 1.0000 | ENEMA | Freq: Once | RECTAL | Status: DC | PRN

## 2017-01-22 MED ORDER — NEOMYCIN-POLYMYXIN B GU 40-200000 IR SOLN
Status: DC | PRN
  Administered 2017-01-22: 12 mL

## 2017-01-22 MED ORDER — SUGAMMADEX SODIUM 200 MG/2ML IV SOLN
INTRAVENOUS | Status: DC | PRN
  Administered 2017-01-22: 150 mg via INTRAVENOUS

## 2017-01-22 MED ORDER — FERROUS SULFATE 325 (65 FE) MG PO TABS
325.0000 mg | ORAL_TABLET | Freq: Two times a day (BID) | ORAL | Status: DC
  Administered 2017-01-23 – 2017-01-25 (×5): 325 mg via ORAL
  Filled 2017-01-22 (×5): qty 1

## 2017-01-22 MED ORDER — LORATADINE 10 MG PO TABS
10.0000 mg | ORAL_TABLET | Freq: Every day | ORAL | Status: DC
  Administered 2017-01-23 – 2017-01-25 (×3): 10 mg via ORAL
  Filled 2017-01-22 (×3): qty 1

## 2017-01-22 MED ORDER — ACETAMINOPHEN 10 MG/ML IV SOLN
INTRAVENOUS | Status: DC | PRN
  Administered 2017-01-22: 1000 mg via INTRAVENOUS

## 2017-01-22 MED ORDER — OXYCODONE HCL 5 MG PO TABS
5.0000 mg | ORAL_TABLET | ORAL | Status: DC | PRN
  Administered 2017-01-22 – 2017-01-24 (×2): 5 mg via ORAL
  Filled 2017-01-22 (×2): qty 1

## 2017-01-22 MED ORDER — ONDANSETRON HCL 4 MG/2ML IJ SOLN: 4.0000 mg | Freq: Once | INTRAMUSCULAR | Status: DC | PRN

## 2017-01-22 MED ORDER — ROCURONIUM BROMIDE 50 MG/5ML IV SOLN
INTRAVENOUS | Status: AC
  Filled 2017-01-22: qty 1

## 2017-01-22 MED ORDER — OXYCODONE HCL 5 MG PO TABS: 10.0000 mg | ORAL_TABLET | ORAL | Status: DC | PRN

## 2017-01-22 MED ORDER — SODIUM CHLORIDE 0.9 % IV SOLN
INTRAVENOUS | Status: DC | PRN
  Administered 2017-01-22: 60 mL

## 2017-01-22 MED ORDER — BUPIVACAINE LIPOSOME 1.3 % IJ SUSP
INTRAMUSCULAR | Status: AC
  Filled 2017-01-22: qty 20

## 2017-01-22 MED ORDER — PHENYLEPHRINE HCL 10 MG/ML IJ SOLN
INTRAMUSCULAR | Status: AC
  Filled 2017-01-22: qty 1

## 2017-01-22 MED ORDER — ADULT MULTIVITAMIN W/MINERALS CH
1.0000 | ORAL_TABLET | Freq: Every day | ORAL | Status: DC
  Administered 2017-01-23 – 2017-01-25 (×3): 1 via ORAL
  Filled 2017-01-22 (×3): qty 1

## 2017-01-22 MED ORDER — ONDANSETRON HCL 4 MG/2ML IJ SOLN: 4.0000 mg | Freq: Four times a day (QID) | INTRAMUSCULAR | Status: DC | PRN

## 2017-01-22 MED ORDER — GABAPENTIN 300 MG PO CAPS
300.0000 mg | ORAL_CAPSULE | Freq: Every day | ORAL | Status: DC
  Administered 2017-01-22 – 2017-01-24 (×3): 300 mg via ORAL
  Filled 2017-01-22 (×2): qty 1

## 2017-01-22 MED ORDER — PHENOL 1.4 % MT LIQD
1.0000 | OROMUCOSAL | Status: DC | PRN
  Filled 2017-01-22: qty 177

## 2017-01-22 MED ORDER — TRANEXAMIC ACID 1000 MG/10ML IV SOLN
INTRAVENOUS | Status: DC | PRN
  Administered 2017-01-22: 1000 mg via INTRAVENOUS

## 2017-01-22 MED ORDER — MENTHOL 3 MG MT LOZG
1.0000 | LOZENGE | OROMUCOSAL | Status: DC | PRN
  Filled 2017-01-22: qty 9

## 2017-01-22 MED ORDER — FLUTICASONE PROPIONATE 50 MCG/ACT NA SUSP
2.0000 | Freq: Every day | NASAL | Status: DC
  Administered 2017-01-23 – 2017-01-25 (×2): 2 via NASAL
  Filled 2017-01-22: qty 16

## 2017-01-22 MED ORDER — CHLORHEXIDINE GLUCONATE 4 % EX LIQD: 60.0000 mL | Freq: Once | CUTANEOUS | Status: DC

## 2017-01-22 MED ORDER — FENTANYL CITRATE (PF) 100 MCG/2ML IJ SOLN
INTRAMUSCULAR | Status: AC
  Filled 2017-01-22: qty 2

## 2017-01-22 MED ORDER — LOSARTAN POTASSIUM 50 MG PO TABS
50.0000 mg | ORAL_TABLET | Freq: Every day | ORAL | Status: DC
  Administered 2017-01-22 – 2017-01-25 (×4): 50 mg via ORAL
  Filled 2017-01-22 (×4): qty 1

## 2017-01-22 MED ORDER — CALCIUM-MAGNESIUM-ZINC 333.33-133.33-5 MG PO TABS
1.0000 | ORAL_TABLET | Freq: Every day | ORAL | Status: DC
  Filled 2017-01-22 (×5): qty 1

## 2017-01-22 MED ORDER — FENTANYL CITRATE (PF) 100 MCG/2ML IJ SOLN
25.0000 ug | INTRAMUSCULAR | Status: DC | PRN
  Administered 2017-01-22 (×4): 25 ug via INTRAVENOUS

## 2017-01-22 MED ORDER — MAGNESIUM HYDROXIDE 400 MG/5ML PO SUSP: 30.0000 mL | Freq: Every day | ORAL | Status: DC | PRN

## 2017-01-22 MED ORDER — PROPOFOL 500 MG/50ML IV EMUL
INTRAVENOUS | Status: DC | PRN
  Administered 2017-01-22: 20 ug/kg/min via INTRAVENOUS

## 2017-01-22 MED ORDER — SENNOSIDES-DOCUSATE SODIUM 8.6-50 MG PO TABS
1.0000 | ORAL_TABLET | Freq: Two times a day (BID) | ORAL | Status: DC
  Administered 2017-01-22 – 2017-01-24 (×5): 1 via ORAL
  Filled 2017-01-22 (×6): qty 1

## 2017-01-22 MED ORDER — MIDAZOLAM HCL 2 MG/2ML IJ SOLN
INTRAMUSCULAR | Status: AC
  Filled 2017-01-22: qty 2

## 2017-01-22 SURGICAL SUPPLY — 63 items
BATTERY INSTRU NAVIGATION (MISCELLANEOUS) ×12 IMPLANT
BLADE SAW 1 (BLADE) ×3 IMPLANT
BLADE SAW 1/2 (BLADE) ×3 IMPLANT
BLADE SAW 70X12.5 (BLADE) IMPLANT
CANISTER SUCT 1200ML W/VALVE (MISCELLANEOUS) ×3 IMPLANT
CANISTER SUCT 3000ML PPV (MISCELLANEOUS) ×6 IMPLANT
CAPT KNEE TOTAL 3 ATTUNE ×3 IMPLANT
CATH FOLEY 2WAY 12FR 5CC LATEX (CATHETERS) ×3 IMPLANT
CEMENT HV SMART SET (Cement) ×6 IMPLANT
COOLER POLAR GLACIER W/PUMP (MISCELLANEOUS) ×3 IMPLANT
CUFF TOURN 24 STER (MISCELLANEOUS) IMPLANT
CUFF TOURN 30 STER DUAL PORT (MISCELLANEOUS) IMPLANT
DRAPE SHEET LG 3/4 BI-LAMINATE (DRAPES) ×3 IMPLANT
DRSG DERMACEA 8X12 NADH (GAUZE/BANDAGES/DRESSINGS) ×3 IMPLANT
DRSG OPSITE POSTOP 4X14 (GAUZE/BANDAGES/DRESSINGS) ×3 IMPLANT
DRSG TEGADERM 4X4.75 (GAUZE/BANDAGES/DRESSINGS) ×3 IMPLANT
DURAPREP 26ML APPLICATOR (WOUND CARE) ×6 IMPLANT
ELECT CAUTERY BLADE 6.4 (BLADE) ×3 IMPLANT
ELECT REM PT RETURN 9FT ADLT (ELECTROSURGICAL) ×3
ELECTRODE REM PT RTRN 9FT ADLT (ELECTROSURGICAL) ×1 IMPLANT
EVACUATOR 1/8 PVC DRAIN (DRAIN) ×3 IMPLANT
EX-PIN ORTHOLOCK NAV 4X150 (PIN) ×6 IMPLANT
GLOVE BIOGEL M STRL SZ7.5 (GLOVE) ×6 IMPLANT
GLOVE BIOGEL PI IND STRL 9 (GLOVE) ×1 IMPLANT
GLOVE BIOGEL PI INDICATOR 9 (GLOVE) ×2
GLOVE INDICATOR 8.0 STRL GRN (GLOVE) ×3 IMPLANT
GLOVE SURG SYN 9.0  PF PI (GLOVE) ×2
GLOVE SURG SYN 9.0 PF PI (GLOVE) ×1 IMPLANT
GOWN STRL REUS W/ TWL LRG LVL3 (GOWN DISPOSABLE) ×2 IMPLANT
GOWN STRL REUS W/TWL 2XL LVL3 (GOWN DISPOSABLE) ×3 IMPLANT
GOWN STRL REUS W/TWL LRG LVL3 (GOWN DISPOSABLE) ×4
HOLDER FOLEY CATH W/STRAP (MISCELLANEOUS) ×3 IMPLANT
HOOD PEEL AWAY FLYTE STAYCOOL (MISCELLANEOUS) ×6 IMPLANT
KIT RM TURNOVER STRD PROC AR (KITS) ×3 IMPLANT
KNIFE SCULPS 14X20 (INSTRUMENTS) ×3 IMPLANT
LABEL OR SOLS (LABEL) ×3 IMPLANT
NDL SAFETY ECLIPSE 18X1.5 (NEEDLE) ×1 IMPLANT
NEEDLE HYPO 18GX1.5 SHARP (NEEDLE) ×2
NEEDLE SPNL 20GX3.5 QUINCKE YW (NEEDLE) ×6 IMPLANT
NS IRRIG 500ML POUR BTL (IV SOLUTION) ×3 IMPLANT
PACK TOTAL KNEE (MISCELLANEOUS) ×3 IMPLANT
PAD WRAPON POLAR KNEE (MISCELLANEOUS) ×1 IMPLANT
PIN FIXATION 1/8DIA X 3INL (PIN) ×3 IMPLANT
PULSAVAC PLUS IRRIG FAN TIP (DISPOSABLE) ×3
SOL .9 NS 3000ML IRR  AL (IV SOLUTION) ×2
SOL .9 NS 3000ML IRR UROMATIC (IV SOLUTION) ×1 IMPLANT
SOL PREP PVP 2OZ (MISCELLANEOUS) ×3
SOLUTION PREP PVP 2OZ (MISCELLANEOUS) ×1 IMPLANT
SPONGE DRAIN TRACH 4X4 STRL 2S (GAUZE/BANDAGES/DRESSINGS) ×3 IMPLANT
STAPLER SKIN PROX 35W (STAPLE) ×3 IMPLANT
STRAP TIBIA SHORT (MISCELLANEOUS) ×3 IMPLANT
SUCTION FRAZIER HANDLE 10FR (MISCELLANEOUS) ×2
SUCTION TUBE FRAZIER 10FR DISP (MISCELLANEOUS) ×1 IMPLANT
SUT VIC AB 0 CT1 36 (SUTURE) ×3 IMPLANT
SUT VIC AB 1 CT1 36 (SUTURE) ×6 IMPLANT
SUT VIC AB 2-0 CT2 27 (SUTURE) ×3 IMPLANT
SYR 20CC LL (SYRINGE) ×3 IMPLANT
SYR 30ML LL (SYRINGE) ×6 IMPLANT
TIP FAN IRRIG PULSAVAC PLUS (DISPOSABLE) ×1 IMPLANT
TOWEL OR 17X26 4PK STRL BLUE (TOWEL DISPOSABLE) ×3 IMPLANT
TOWER CARTRIDGE SMART MIX (DISPOSABLE) ×3 IMPLANT
TRAY FOLEY W/METER SILVER 16FR (SET/KITS/TRAYS/PACK) ×3 IMPLANT
WRAPON POLAR PAD KNEE (MISCELLANEOUS) ×3

## 2017-01-22 NOTE — Anesthesia Preprocedure Evaluation (Signed)
Anesthesia Evaluation  Patient identified by MRN, date of birth, ID band Patient awake    Reviewed: Allergy & Precautions, H&P , NPO status , Patient's Chart, lab work & pertinent test results, reviewed documented beta blocker date and time   History of Anesthesia Complications Negative for: history of anesthetic complications  Airway Mallampati: II  TM Distance: >3 FB Neck ROM: full    Dental  (+) Edentulous Upper, Edentulous Lower   Pulmonary neg pulmonary ROS,           Cardiovascular Exercise Tolerance: Good hypertension, (-) angina+ CAD and + Cardiac Stents  (-) Past MI and (-) CABG + dysrhythmias (-) Valvular Problems/Murmurs     Neuro/Psych negative neurological ROS  negative psych ROS   GI/Hepatic Neg liver ROS, GERD  ,  Endo/Other  negative endocrine ROS  Renal/GU Renal disease (kidney stones)  negative genitourinary   Musculoskeletal   Abdominal   Peds  Hematology negative hematology ROS (+)   Anesthesia Other Findings Past Medical History: No date: Arthritis No date: Coronary artery disease No date: GERD (gastroesophageal reflux disease) No date: H/O vertigo No date: Heart disease No date: History of diverticulosis No date: History of kidney stones No date: History of seasonal allergies No date: Hypertension No date: Vascular disease   Reproductive/Obstetrics negative OB ROS                             Anesthesia Physical Anesthesia Plan  ASA: III  Anesthesia Plan: Spinal   Post-op Pain Management:    Induction:   PONV Risk Score and Plan: 3 and Ondansetron, Dexamethasone and Propofol infusion  Airway Management Planned: Simple Face Mask  Additional Equipment:   Intra-op Plan:   Post-operative Plan:   Informed Consent: I have reviewed the patients History and Physical, chart, labs and discussed the procedure including the risks, benefits and alternatives  for the proposed anesthesia with the patient or authorized representative who has indicated his/her understanding and acceptance.   Dental Advisory Given  Plan Discussed with: Anesthesiologist, CRNA and Surgeon  Anesthesia Plan Comments:         Anesthesia Quick Evaluation

## 2017-01-22 NOTE — Anesthesia Post-op Follow-up Note (Signed)
Anesthesia QCDR form completed.        

## 2017-01-22 NOTE — Op Note (Signed)
OPERATIVE NOTE  DATE OF SURGERY:  01/22/2017  PATIENT NAME:  Katelyn Crawford   DOB: Jan 10, 1941  MRN: [id number]  PRE-OPERATIVE DIAGNOSIS: Degenerative arthrosis of the left knee, primary  POST-OPERATIVE DIAGNOSIS:  Same  PROCEDURE:  Left total knee arthroplasty using computer-assisted navigation  SURGEON:  Jena Gauss. M.D.  ASSISTANT:  Van Clines, PA (present and scrubbed throughout the case, critical for assistance with exposure, retraction, instrumentation, and closure)  ANESTHESIA: spinal and general  ESTIMATED BLOOD LOSS: 50 mL  FLUIDS REPLACED: 800 mL of crystalloid  TOURNIQUET TIME: 88 minutes  DRAINS: 2 medium Hemovac drains  SOFT TISSUE RELEASES: Anterior cruciate ligament, posterior cruciate ligament, deep medial collateral ligament, patellofemoral ligament  IMPLANTS UTILIZED: DePuy Attune size 4 posterior stabilized femoral component (cemented), size 4 rotating platform tibial component (cemented), 35 mm medialized dome patella (cemented), and a 6 mm stabilized rotating platform polyethylene insert.  INDICATIONS FOR SURGERY: Katelyn Crawford is a 77 y.o. year old female with a long history of progressive knee pain. X-rays demonstrated severe degenerative changes in tricompartmental fashion. The patient had not seen any significant improvement despite conservative nonsurgical intervention. After discussion of the risks and benefits of surgical intervention, the patient expressed understanding of the risks benefits and agree with plans for total knee arthroplasty.   The risks, benefits, and alternatives were discussed at length including but not limited to the risks of infection, bleeding, nerve injury, stiffness, blood clots, the need for revision surgery, cardiopulmonary complications, among others, and they were willing to proceed.  PROCEDURE IN DETAIL: The patient was brought into the operating room and, after adequate spinal and general anesthesia was achieved, a tourniquet  was placed on the patient's upper thigh. The patient's knee and leg were cleaned and prepped with alcohol and DuraPrep and draped in the usual sterile fashion. A "timeout" was performed as per usual protocol. The lower extremity was exsanguinated using an Esmarch, and the tourniquet was inflated to 300 mmHg. An anterior longitudinal incision was made followed by a standard mid vastus approach. The deep fibers of the medial collateral ligament were elevated in a subperiosteal fashion off of the medial flare of the tibia so as to maintain a continuous soft tissue sleeve. The patella was subluxed laterally and the patellofemoral ligament was incised. Inspection of the knee demonstrated severe degenerative changes with full-thickness loss of articular cartilage. Osteophytes were debrided using a rongeur. Anterior and posterior cruciate ligaments were excised. Two 4.0 mm Schanz pins were inserted in the femur and into the tibia for attachment of the array of trackers used for computer-assisted navigation. Hip center was identified using a circumduction technique. Distal landmarks were mapped using the computer. The distal femur and proximal tibia were mapped using the computer. The distal femoral cutting guide was positioned using computer-assisted navigation so as to achieve a 5 distal valgus cut. The femur was sized and it was felt that a size 4 femoral component was appropriate. A size 4 femoral cutting guide was positioned and the anterior cut was performed and verified using the computer. This was followed by completion of the posterior and chamfer cuts. Femoral cutting guide for the central box was then positioned in the center box cut was performed.  Attention was then directed to the proximal tibia. Medial and lateral menisci were excised. The extramedullary tibial cutting guide was positioned using computer-assisted navigation so as to achieve a 0 varus-valgus alignment and 3 posterior slope. The cut was  performed and verified using the  computer. The proximal tibia was sized and it was felt that a size 4 tibial tray was appropriate. Tibial and femoral trials were inserted followed by insertion of a 6 mm polyethylene insert. This allowed for excellent mediolateral soft tissue balancing both in flexion and in full extension. Finally, the patella was cut and prepared so as to accommodate a 35 mm medialized dome patella. A patella trial was placed and the knee was placed through a range of motion with excellent patellar tracking appreciated. The femoral trial was removed after debridement of posterior osteophytes. The central post-hole for the tibial component was reamed followed by insertion of a keel punch. Tibial trials were then removed. Cut surfaces of bone were irrigated with copious amounts of normal saline with antibiotic solution using pulsatile lavage and then suctioned dry. Polymethylmethacrylate cement was prepared in the usual fashion using a vacuum mixer. Cement was applied to the cut surface of the proximal tibia as well as along the undersurface of a size 4 rotating platform tibial component. Tibial component was positioned and impacted into place. Excess cement was removed using Personal assistant. Cement was then applied to the cut surfaces of the femur as well as along the posterior flanges of the size 4 femoral component. The femoral component was positioned and impacted into place. Excess cement was removed using Personal assistant. A 6 mm polyethylene trial was inserted and the knee was brought into full extension with steady axial compression applied. Finally, cement was applied to the backside of a 35 mm medialized dome patella and the patellar component was positioned and patellar clamp applied. Excess cement was removed using Personal assistant. After adequate curing of the cement, the tourniquet was deflated after a total tourniquet time of 88 minutes. Hemostasis was achieved using electrocautery. The  knee was irrigated with copious amounts of normal saline with antibiotic solution using pulsatile lavage and then suctioned dry. 20 mL of 1.3% Exparel and 60 mL of 0.25% Marcaine in 40 mL of normal saline was injected along the posterior capsule, medial and lateral gutters, and along the arthrotomy site. A 6 mm stabilized rotating platform polyethylene insert was inserted and the knee was placed through a range of motion with excellent mediolateral soft tissue balancing appreciated and excellent patellar tracking noted. 2 medium drains were placed in the wound bed and brought out through separate stab incisions. The medial parapatellar portion of the incision was reapproximated using interrupted sutures of #1 Vicryl. Subcutaneous tissue was approximated in layers using first #0 Vicryl followed #2-0 Vicryl. The skin was approximated with skin staples. A sterile dressing was applied.  The patient tolerated the procedure well and was transported to the recovery room in stable condition.    James P. Angie Fava., M.D.

## 2017-01-22 NOTE — NC FL2 (Signed)
[place] MEDICAID FL2 LEVEL OF CARE SCREENING TOOL     IDENTIFICATION  Patient Name: Katelyn Crawford Birthdate: November 16, 1940 Sex: female Admission Date (Current Location): 01/22/2017  [place] and [place] Number:  Chiropodist and Address:  [facility] [address], [place], [place] [address]      Provider Number: [id number]  Attending Physician Name and Address:  Donato Heinz, MD  Relative Name and Phone Number:       Current Level of Care: Hospital Recommended Level of Care: Skilled Nursing Facility Prior Approval Number:    Date Approved/Denied:   PASRR Number: [id number])  Discharge Plan: SNF    Current Diagnoses: Patient Active Problem List   Diagnosis Date Noted  . Chronic sinus bradycardia 01/22/2017  . Degenerative disc disease, lumbar 01/22/2017  . Dizziness 01/22/2017  . Nephrolithiasis 01/22/2017  . Osteoporosis, 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/22/2017  . PVC's (premature ventricular contractions) 01/22/2017  . S/P total knee arthroplasty 01/22/2017  . 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 CKD (chronic kidney disease), 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  . GERD (gastroesophageal reflux disease) 06/25/2013  . Coronary artery disease involving native coronary artery of native heart 06/24/2013  . Essential hypertension 06/21/2013  . Mixed hyperlipidemia 06/21/2013    Orientation RESPIRATION BLADDER Height & Weight     Self, Time, Situation, Place  O2(1 Liter Oxygen. ) Continent Weight: 152 lb (68.9 kg) Height:  4\' 10"  (147.3 cm)  BEHAVIORAL SYMPTOMS/MOOD NEUROLOGICAL BOWEL NUTRITION STATUS      Continent Diet(Diet: Clear Liquid to be Advanced. )  AMBULATORY STATUS COMMUNICATION OF NEEDS Skin   Extensive Assist Verbally Surgical wounds(Incision: Left Knee. )                       Personal Care Assistance Level of Assistance  Bathing, Feeding, Dressing Bathing Assistance: Limited assistance Feeding assistance:  Independent Dressing Assistance: Limited assistance     Functional Limitations Info  Sight, Hearing, Speech Sight Info: Adequate Hearing Info: Adequate Speech Info: Adequate    SPECIAL CARE FACTORS FREQUENCY  PT (By licensed PT), OT (By licensed OT)     PT Frequency: (5) OT Frequency: (5)            Contractures      Additional Factors Info  Code Status, Allergies Code Status Info: (Full Code. ) Allergies Info: (Penicillins, Sulfa Antibiotics)           Current Medications (01/22/2017):  This is the current hospital active medication list Current Facility-Administered Medications  Medication Dose Route Frequency Provider Last Rate Last Dose  . 0.9 %  sodium chloride infusion   Intravenous Continuous Hooten, Illene Labrador, MD 100 mL/hr at 01/22/17 1645    . acetaminophen (OFIRMEV) IV 1,000 mg  1,000 mg Intravenous Q6H Hooten, Illene Labrador, MD      . acetaminophen (TYLENOL) tablet 650 mg  650 mg Oral Q4H PRN Hooten, Illene Labrador, MD       Or  . acetaminophen (TYLENOL) suppository 650 mg  650 mg Rectal Q4H PRN Hooten, Illene Labrador, MD      . alum & mag hydroxide-simeth (MAALOX/MYLANTA) 200-200-20 MG/5ML suspension 30 mL  30 mL Oral Q4H PRN Hooten, Illene Labrador, MD      . atorvastatin (LIPITOR) tablet 40 mg  40 mg Oral q1800 Hooten, Illene Labrador, MD      . bisacodyl (DULCOLAX) suppository 10 mg  10 mg Rectal Daily PRN Hooten, Illene Labrador, MD      .  Calcium-Magnesium-Zinc 333.33-133.33-5 MG TABS 1 tablet  1 tablet Oral Daily Hooten, Illene Labrador, MD      . celecoxib (CELEBREX) capsule 200 mg  200 mg Oral Q12H Hooten, Illene Labrador, MD      . clindamycin (CLEOCIN) IVPB 600 mg  600 mg Intravenous Q6H Hooten, Illene Labrador, MD   900 mg at 01/22/17 1145  . diphenhydrAMINE (BENADRYL) 12.5 MG/5ML elixir 12.5-25 mg  12.5-25 mg Oral Q4H PRN Hooten, Illene Labrador, MD      . Melene Muller ON 01/23/2017] enoxaparin (LOVENOX) injection 30 mg  30 mg Subcutaneous Q12H Hooten, Illene Labrador, MD      . ferrous sulfate tablet 325 mg  325 mg Oral BID WC Hooten,  Illene Labrador, MD      . fluticasone (FLONASE) 50 MCG/ACT nasal spray 2 spray  2 spray Each Nare Daily Hooten, Illene Labrador, MD      . gabapentin (NEURONTIN) capsule 300 mg  300 mg Oral QHS Hooten, Illene Labrador, MD      . Melene Muller ON 01/23/2017] loratadine (CLARITIN) tablet 10 mg  10 mg Oral Daily Hooten, Illene Labrador, MD      . losartan (COZAAR) tablet 50 mg  50 mg Oral Daily Hooten, Illene Labrador, MD      . magnesium hydroxide (MILK OF MAGNESIA) suspension 30 mL  30 mL Oral Daily PRN Hooten, Illene Labrador, MD      . menthol-cetylpyridinium (CEPACOL) lozenge 3 mg  1 lozenge Oral PRN Hooten, Illene Labrador, MD       Or  . phenol (CHLORASEPTIC) mouth spray 1 spray  1 spray Mouth/Throat PRN Hooten, Illene Labrador, MD      . metoCLOPramide (REGLAN) tablet 10 mg  10 mg Oral TID AC & HS Hooten, Illene Labrador, MD      . morphine 2 MG/ML injection 2 mg  2 mg Intravenous Q2H PRN Hooten, Illene Labrador, MD      . MULTIVITAMIN ADULT TABS 1 tablet  1 tablet Oral Daily Hooten, Illene Labrador, MD      . ondansetron (ZOFRAN) tablet 4 mg  4 mg Oral Q6H PRN Hooten, Illene Labrador, MD       Or  . ondansetron (ZOFRAN) injection 4 mg  4 mg Intravenous Q6H PRN Hooten, Illene Labrador, MD      . oxyCODONE (Oxy IR/ROXICODONE) immediate release tablet 10 mg  10 mg Oral Q3H PRN Hooten, Illene Labrador, MD      . oxyCODONE (Oxy IR/ROXICODONE) immediate release tablet 5 mg  5 mg Oral Q3H PRN Hooten, Illene Labrador, MD      . pantoprazole (PROTONIX) EC tablet 40 mg  40 mg Oral BID Hooten, Illene Labrador, MD      . senna-docusate (Senokot-S) tablet 1 tablet  1 tablet Oral BID Hooten, Illene Labrador, MD      . sertraline (ZOLOFT) tablet 25 mg  25 mg Oral QHS Hooten, Illene Labrador, MD      . sodium phosphate (FLEET) 7-19 GM/118ML enema 1 enema  1 enema Rectal Once PRN Hooten, Illene Labrador, MD      . traMADol (ULTRAM) tablet 50-100 mg  50-100 mg Oral Q4H PRN Hooten, Illene Labrador, MD      . vitamin C (ASCORBIC ACID) tablet 500 mg  500 mg Oral Daily Hooten, Illene Labrador, MD      . Vitamin D-3 CAPS   Oral Daily Hooten, Illene Labrador, MD         Discharge  Medications: Please see discharge summary for  a list of discharge medications.  Relevant Imaging Results:  Relevant Lab Results:   Additional Information (SSN: [id number])  Jocee Kissick, Darleen Crocker, LCSW

## 2017-01-22 NOTE — Anesthesia Procedure Notes (Signed)
Date/Time: 01/22/2017 11:10 AM Performed by: Henrietta Hoover, CRNA Pre-anesthesia Checklist: Patient identified, Emergency Drugs available, Suction available, Patient being monitored and Timeout performed Patient Re-evaluated:Patient Re-evaluated prior to induction Oxygen Delivery Method: Nasal cannula Placement Confirmation: positive ETCO2 Dental Injury: Teeth and Oropharynx as per pre-operative assessment

## 2017-01-22 NOTE — OR Nursing (Signed)
Clarified celebrex order with sulfa allergy with Dr Ernest Pine. Order received to continue med

## 2017-01-22 NOTE — Anesthesia Procedure Notes (Signed)
Spinal  Patient location during procedure: OR Start time: 01/22/2017 11:27 AM End time: 01/22/2017 11:38 AM Staffing Anesthesiologist: Martha Clan, MD Resident/CRNA: Allean Found, CRNA Performed: anesthesiologist and resident/CRNA  Preanesthetic Checklist Completed: patient identified, site marked, surgical consent, pre-op evaluation, timeout performed, IV checked, risks and benefits discussed and monitors and equipment checked Spinal Block Patient position: sitting Prep: ChloraPrep Patient monitoring: heart rate, continuous pulse ox, blood pressure and cardiac monitor Approach: midline Location: L4-5 Injection technique: single-shot Needle Needle type: Quincke  Needle gauge: 22 G Needle length: 9 cm Assessment Sensory level: T10 Additional Notes Negative paresthesia. Negative blood return. Positive free-flowing CSF. Expiration date of kit checked and confirmed. Patient tolerated procedure well, without complications.

## 2017-01-22 NOTE — Transfer of Care (Signed)
Immediate Anesthesia Transfer of Care Note  Patient: Katelyn Crawford  Procedure(s) Performed: COMPUTER ASSISTED TOTAL KNEE ARTHROPLASTY (Left )  Patient Location: PACU  Anesthesia Type:General  Level of Consciousness: sedated  Airway & Oxygen Therapy: Patient Spontanous Breathing and Patient connected to face mask oxygen  Post-op Assessment: Report given to RN and Post -op Vital signs reviewed and stable  Post vital signs: Reviewed and stable  Last Vitals:  Vitals:   01/22/17 0952  BP: (!) 190/76  Pulse: 65  Resp: 16  Temp: (!) 36.2 C  SpO2: 100%    Last Pain:  Vitals:   01/22/17 0952  TempSrc: Temporal  PainSc: 4          Complications: No apparent anesthesia complications

## 2017-01-22 NOTE — H&P (Signed)
The patient has been re-examined, and the chart reviewed, and there have been no interval changes to the documented history and physical.    The risks, benefits, and alternatives have been discussed at length. The patient expressed understanding of the risks benefits and agreed with plans for surgical intervention.  Avish Torry P. Jahi Roza, Jr. M.D.    

## 2017-01-22 NOTE — Progress Notes (Addendum)
Pt arrived to unit from PACU. Alert and oriented X4. Skin assessment completed with Serenity, RN. Pt on 2L O2. IV infusing NS. Urinary catheter in place. Family at bedside. Bone foam in place. Educated on incentive spirometer. Knee immobilizer applied. Pt complains of mild pain. PRN oxycodone given. Will continue to monitor.

## 2017-01-23 ENCOUNTER — Encounter: Payer: Self-pay | Admitting: Orthopedic Surgery

## 2017-01-23 MED ORDER — ENOXAPARIN SODIUM 30 MG/0.3ML ~~LOC~~ SOLN: 30.0000 mg | Freq: Two times a day (BID) | SUBCUTANEOUS | 0 refills | Status: DC

## 2017-01-23 MED ORDER — TRAMADOL HCL 50 MG PO TABS: 50.0000 mg | ORAL_TABLET | ORAL | 0 refills | Status: DC | PRN

## 2017-01-23 MED ORDER — OXYCODONE HCL 5 MG PO TABS: 5.0000 mg | ORAL_TABLET | ORAL | 0 refills | Status: DC | PRN

## 2017-01-23 NOTE — Evaluation (Signed)
Physical Therapy Evaluation Patient Details Name: Katelyn Crawford MRN: [id number] DOB: 08-Dec-1940 Today's Date: 01/23/2017   History of Present Illness  77 y.o. female s/p L TKA 01/22/17. PMHx includes: HTN, Nephrolithiasis, Diverticulosis, CAD, Vertigo, and GERD.  Clinical Impression  Pt eager to work with PT but ultimately struggled significantly with ambulation, WBing tolerance and generally any standing acts were difficult for her (only walked ~4 ft with great struggle).  Pt reliant on the walker but did c/o some dizziness and ultimately needed to sit after minimal ambulation.  Pt did show good quad control and was able to do SLRs as well as show good ROM 0-72.  Pt unsafe and unable to return home at this time, will require STR on discharge.    Follow Up Recommendations SNF    Equipment Recommendations  (youth height front wheeled walker )    Recommendations for Other Services       Precautions / Restrictions Precautions Precautions: Knee;Fall Required Braces or Orthoses: Knee Immobilizer - Left Restrictions Weight Bearing Restrictions: Yes LLE Weight Bearing: Weight bearing as tolerated      Mobility  Bed Mobility Overal bed mobility: Needs Assistance Bed Mobility: Supine to Sit     Supine to sit: Min assist;Min guard     General bed mobility comments: Pt with definite need for rails, light cuing and assist to get to sitting  Transfers Overall transfer level: Needs assistance Equipment used: Rolling walker (2 wheeled) Transfers: Sit to/from Stand Sit to Stand: Min guard;Min assist         General transfer comment: Pt again needing only light assist and cuing for set up/sequencing/hand placement.    Ambulation/Gait Ambulation/Gait assistance: Mod assist;Min assist Ambulation Distance (Feet): 4 Feet Assistive device: Rolling walker (2 wheeled)       General Gait Details: Pt with very hesitant and unsure with limited ambulation in the room.  She showed good effort,  but ultimately was very limited with what she was able to do.  Poor confidence with WBing despite no buckling, pt also reported some lightheaded/wooziness.  Stairs            Wheelchair Mobility    Modified Rankin (Stroke Patients Only)       Balance Overall balance assessment: Needs assistance Sitting-balance support: No upper extremity supported Sitting balance-Leahy Scale: Good Sitting balance - Comments: Pt able to maintain sitting balance at EOB well   Standing balance support: Bilateral upper extremity supported Standing balance-Leahy Scale: Poor Standing balance comment: Pt with poor tolerance with standing, showed heavy reliance on the walker and struggled with any weight shifting                             Pertinent Vitals/Pain Pain Assessment: 0-10 Pain Score: 5  Pain Location: L knee, pain increases with activity Pain Descriptors / Indicators: Aching Pain Intervention(s): Limited activity within patient's tolerance;Monitored during session;Premedicated before session    Home Living Family/patient expects to be discharged to:: Skilled nursing facility Living Arrangements: Alone Available Help at Discharge: Family Type of Home: House Home Access: Stairs to enter   Secretary/administrator of Steps: 5 Home Layout: One level Home Equipment: Grab bars - tub/shower;Walker - 2 wheels      Prior Function Level of Independence: Independent         Comments: Pt. reports being independent with ADL, and IADLs at home, however tasks became progressively more difficult secondary to pain since left  knee meniscus surgery in January of 2018     Hand Dominance   Dominant Hand: Right    Extremity/Trunk Assessment   Upper Extremity Assessment Upper Extremity Assessment: Generalized weakness    Lower Extremity Assessment Lower Extremity Assessment: Generalized weakness(expected L post-op weakness, did 10 SLRs)       Communication    Communication: No difficulties  Cognition Arousal/Alertness: Awake/alert Behavior During Therapy: WFL for tasks assessed/performed Overall Cognitive Status: Within Functional Limits for tasks assessed                                        General Comments      Exercises Total Joint Exercises Ankle Circles/Pumps: AROM;10 reps Quad Sets: Strengthening;10 reps Gluteal Sets: Strengthening;10 reps Heel Slides: Strengthening;10 reps Hip ABduction/ADduction: Strengthening;10 reps Straight Leg Raises: AROM;10 reps Long Arc Quad: Strengthening;10 reps Knee Flexion: PROM;5 reps  L Knee ROM: 0-72   Assessment/Plan    PT Assessment Patient needs continued PT services  PT Problem List Decreased strength;Decreased range of motion;Decreased activity tolerance;Decreased balance;Decreased mobility;Decreased coordination;Decreased knowledge of use of DME;Decreased safety awareness;Pain       PT Treatment Interventions DME instruction;Gait training;Stair training;Functional mobility training;Therapeutic activities;Therapeutic exercise;Balance training;Neuromuscular re-education;Patient/family education    PT Goals (Current goals can be found in the Care Plan section)  Acute Rehab PT Goals Patient Stated Goal: get back to walking PT Goal Formulation: With patient Time For Goal Achievement: 02/06/17 Potential to Achieve Goals: Fair    Frequency BID   Barriers to discharge        Co-evaluation               AM-PAC PT "6 Clicks" Daily Activity  Outcome Measure Difficulty turning over in bed (including adjusting bedclothes, sheets and blankets)?: Unable Difficulty moving from lying on back to sitting on the side of the bed? : Unable Difficulty sitting down on and standing up from a chair with arms (e.g., wheelchair, bedside commode, etc,.)?: Unable Help needed moving to and from a bed to chair (including a wheelchair)?: A Lot Help needed walking in hospital room?: A  Lot Help needed climbing 3-5 steps with a railing? : Total 6 Click Score: 8    End of Session Equipment Utilized During Treatment: Gait belt Activity Tolerance: Patient limited by fatigue Patient left: with chair alarm set;with call bell/phone within reach Nurse Communication: Mobility status PT Visit Diagnosis: Muscle weakness (generalized) (M62.81);Difficulty in walking, not elsewhere classified (R26.2)    Time: 5038-8828 PT Time Calculation (min) (ACUTE ONLY): 33 min   Charges:   PT Evaluation $PT Eval Low Complexity: 1 Low PT Treatments $Therapeutic Exercise: 8-22 mins   PT G Codes:        Malachi Pro, DPT 01/23/2017, 11:42 AM

## 2017-01-23 NOTE — Clinical Social Work Note (Signed)
Clinical Social Work Assessment  Patient Details  Name: Katelyn Crawford MRN: [id number] Date of Birth: 11-Sep-1940  Date of referral:  01/23/17               Reason for consult:  Facility Placement                Permission sought to share information with:  Chartered certified accountant granted to share information::  Yes, Verbal Permission Granted  Name::      [place]::   [place]   Relationship::     Contact Information:     Housing/Transportation Living arrangements for the past 2 months:  [place] of Information:  Patient, Adult Children Patient Interpreter Needed:  None Criminal Activity/Legal Involvement Pertinent to Current Situation/Hospitalization:  No - Comment as needed Significant Relationships:  Adult Children Lives with:  Self Do you feel safe going back to the place where you live?  Yes Need for family participation in patient care:  Yes (Comment)  Care giving concerns:  Patient lives alone in [place].    Social Worker assessment / plan:  Holiday representative (CSW) received SNF consult. PT is recommending SNF. CSW met with patient and her daughter Katelyn Crawford [phone] was at bedside. Patient was alert and oriented X4 and was sitting up in the chair at bedside. CSW introduced self and explained role of CSW department. Patient reported that she lives in [place] and her daughter Katelyn Crawford is her HPOA. CSW explained SNF process and that medicare requires a 3 night qualifying inpatient stay in a hospital in order to pay for SNF. Patient was admitted to inpatient on 01/22/17. Patient verbalized her understanding and is agreeable to SNF search in [place]. FL2 complete and faxed out.   CSW presented bed offers to patient and she chose [facility]. Patient is agreeable to a semi-private room at [facility]. Plan is for patient to D/C to [facility] Saturday 01/25/17 pending medical clearance. [facility] admissions  coordinator at [facility] is aware of above. CSW will continue to follow and assist as needed.   Employment status:  Retired Forensic scientist:  Medicare PT Recommendations:  [place] / Referral to community resources:  [place]  Patient/Family's Response to care:  Patient is agreeable to D/C to [facility]   Patient/Family's Understanding of and Emotional Response to Diagnosis, Current Treatment, and Prognosis:  Patient was very pleasant and thanked CSW for assistance.   Emotional Assessment Appearance:  Appears stated age Attitude/Demeanor/Rapport:    Affect (typically observed):  Accepting, Adaptable, Pleasant Orientation:  Oriented to Self, Oriented to Place, Oriented to  Time, Oriented to Situation Alcohol / Substance use:  Not Applicable Psych involvement (Current and /or in the community):  No (Comment)  Discharge Needs  Concerns to be addressed:  Discharge Planning Concerns Readmission within the last 30 days:  No Current discharge risk:  Dependent with Mobility Barriers to Discharge:  Continued Medical Work up   [employer], Veronia Beets, LCSW 01/23/2017, 4:40 PM

## 2017-01-23 NOTE — Progress Notes (Signed)
Physical Therapy Treatment Patient Details Name: Katelyn Crawford MRN: [id number] DOB: Nov 04, 1940 Today's Date: 01/23/2017    History of Present Illness 77 y.o. female s/p L TKA 01/22/17. PMHx includes: HTN, Nephrolithiasis, Diverticulosis, CAD, Vertigo, and GERD.    PT Comments    Pt was able to ambulate much better this afternoon, though she is still hesitant and disjointed and needed constant cuing.  Pt showing good effort with exercises and bed mobility, ultimately she is making good gains but remains limited with ambulation.  Overall her ROM and quad control are very good, pt pleasant and eager to work with PT despite still not feeling great post surgery.    Follow Up Recommendations  SNF     Equipment Recommendations       Recommendations for Other Services       Precautions / Restrictions Precautions Precautions: Knee;Fall Restrictions LLE Weight Bearing: Weight bearing as tolerated    Mobility  Bed Mobility Overal bed mobility: Needs Assistance Bed Mobility: Supine to Sit     Supine to sit: Min guard     General bed mobility comments: Pt struggled to get L LE up in to bed, but eventually got it up with only CGA  Transfers Overall transfer level: Needs assistance Equipment used: Rolling walker (2 wheeled) Transfers: Sit to/from Stand Sit to Stand: Min guard         General transfer comment: VCs for positioning and sequencing, ultimately did not need direct phyiscal assist  Ambulation/Gait Ambulation/Gait assistance: Min assist;Min guard Ambulation Distance (Feet): 30 Feet Assistive device: Rolling walker (2 wheeled)       General Gait Details: Pt did much better with ambulation this afternoon, though she was still hesitant and needed a lot of cuing and explanation.  With PT directly advancing the walker she was able to show a somewhat consistent cadence, left to her own devices she displayed very disjointed steppage.     Stairs            Wheelchair  Mobility    Modified Rankin (Stroke Patients Only)       Balance Overall balance assessment: Modified Independent           Standing balance-Leahy Scale: Fair                              Cognition Arousal/Alertness: Awake/alert Behavior During Therapy: WFL for tasks assessed/performed Overall Cognitive Status: Within Functional Limits for tasks assessed                                        Exercises Total Joint Exercises Ankle Circles/Pumps: AROM;10 reps Quad Sets: Strengthening;10 reps Gluteal Sets: Strengthening;10 reps Short Arc Quad: AROM;10 reps Heel Slides: Strengthening;10 reps Hip ABduction/ADduction: Strengthening;10 reps Straight Leg Raises: AROM;10 reps Knee Flexion: PROM;5 reps Goniometric ROM: 0-86    General Comments        Pertinent Vitals/Pain Pain Score: 5     Home Living                      Prior Function            PT Goals (current goals can now be found in the care plan section) Progress towards PT goals: Progressing toward goals    Frequency    BID      PT  Plan Current plan remains appropriate    Co-evaluation              AM-PAC PT "6 Clicks" Daily Activity  Outcome Measure  Difficulty turning over in bed (including adjusting bedclothes, sheets and blankets)?: A Little Difficulty moving from lying on back to sitting on the side of the bed? : A Lot Difficulty sitting down on and standing up from a chair with arms (e.g., wheelchair, bedside commode, etc,.)?: A Little Help needed moving to and from a bed to chair (including a wheelchair)?: A Little Help needed walking in hospital room?: A Lot Help needed climbing 3-5 steps with a railing? : Total 6 Click Score: 14    End of Session Equipment Utilized During Treatment: Gait belt Activity Tolerance: Patient limited by fatigue Patient left: with bed alarm set;with call bell/phone within reach;with family/visitor present   PT  Visit Diagnosis: Muscle weakness (generalized) (M62.81);Difficulty in walking, not elsewhere classified (R26.2)     Time: 1610-9604 PT Time Calculation (min) (ACUTE ONLY): 29 min  Charges:  $Gait Training: 8-22 mins $Therapeutic Exercise: 8-22 mins                    G Codes:       Malachi Pro, DPT 01/23/2017, 3:37 PM

## 2017-01-23 NOTE — Clinical Social Work Placement (Signed)
   CLINICAL SOCIAL WORK PLACEMENT  NOTE  Date:  01/23/2017  Patient Details  Name: Katelyn Crawford MRN: [id number] Date of Birth: 08/20/1940  Clinical Social Work is seeking post-discharge placement for this patient at the Skilled  Nursing Facility level of care (*CSW will initial, date and re-position this form in  chart as items are completed):  Yes   Patient/family provided with [place] Clinical Social Work Department's list of facilities offering this level of care within the geographic area requested by the patient (or if unable, by the patient's family).  Yes   Patient/family informed of their freedom to choose among providers that offer the needed level of care, that participate in Medicare, Medicaid or managed care program needed by the patient, have an available bed and are willing to accept the patient.  Yes   Patient/family informed of [facility]'s ownership interest in [facility] and [facility], as well as of the fact that they are under no obligation to receive care at these facilities.  PASRR submitted to EDS on 01/22/17     PASRR number received on 01/22/17     Existing PASRR number confirmed on       FL2 transmitted to all facilities in geographic area requested by pt/family on 01/23/17     FL2 transmitted to all facilities within larger geographic area on       Patient informed that his/her managed care company has contracts with or will negotiate with certain facilities, including the following:        Yes   Patient/family informed of bed offers received.  Patient chooses bed at [facility] )     Physician recommends and patient chooses bed at      Patient to be transferred to   on  .  Patient to be transferred to facility by       Patient family notified on   of transfer.  Name of family member notified:        PHYSICIAN       Additional Comment:    _______________________________________________ Iyania Denne, Darleen Crocker, LCSW 01/23/2017, 4:39  PM

## 2017-01-23 NOTE — Anesthesia Postprocedure Evaluation (Signed)
Anesthesia Post Note  Patient: Katelyn Crawford  Procedure(s) Performed: COMPUTER ASSISTED TOTAL KNEE ARTHROPLASTY (Left )  Patient location during evaluation: PACU Anesthesia Type: Spinal and General Level of consciousness: awake and alert Pain management: pain level controlled Vital Signs Assessment: post-procedure vital signs reviewed and stable Respiratory status: spontaneous breathing, nonlabored ventilation, respiratory function stable and patient connected to nasal cannula oxygen Cardiovascular status: blood pressure returned to baseline and stable Postop Assessment: no apparent nausea or vomiting Anesthetic complications: no Comments: Patient had failed spinal intra-op, so we placed an LMA under general anesthesia.     Last Vitals:  Vitals:   01/23/17 0530 01/23/17 0745  BP: (!) 149/70 (!) 155/61  Pulse: 64 65  Resp: 19 18  Temp: 36.7 C 36.5 C  SpO2: 99% 99%    Last Pain:  Vitals:   01/23/17 0745  TempSrc: Oral  PainSc: 3                  Lenard Simmer

## 2017-01-23 NOTE — Progress Notes (Signed)
ORTHOPAEDICS PROGRESS NOTE  PATIENT NAME: Katelyn Crawford DOB: 1940-02-11  MRN: [id number]  POD # 1: Left total knee arthroplasty  Subjective: The patient states that the pain is been under good control.  No nausea or vomiting. The patient sat at the bedside as per nursing.  Objective: Vital signs in last 24 hours: Temp:  [97.1 F (36.2 C)-98.6 F (37 C)] 98 F (36.7 C) (01/03 0530) Pulse Rate:  [54-81] 64 (01/03 0530) Resp:  [10-20] 19 (01/03 0530) BP: (124-190)/(56-81) 149/70 (01/03 0530) SpO2:  [91 %-100 %] 99 % (01/03 0530) Weight:  [68.9 kg (152 lb)] 68.9 kg (152 lb) (01/02 1632)  Intake/Output from previous day: 01/02 0701 - 01/03 0700 In: 1746.7 [I.V.:921.7; IV Piggyback:825] Out: 1745 [Urine:1625; Drains:70; Blood:50]  No results for input(s): WBC, HGB, HCT, PLT, K, CL, CO2, BUN, CREATININE, GLUCOSE, CALCIUM, LABPT, INR in the last 72 hours.  EXAM General: Well-developed well-nourished female seen in no apparent discomfort. Lungs: clear to auscultation Cardiac: normal rate, regular rhythm, normal S1, S2, no murmurs, rubs, clicks or gallops,  Left lower extremity: Dressing is dry and intact.  Polar Care and bone foam are in place.  Homans test is negative.  The patient can perform straight leg raise with assist. Neurologic: Awake, alert, and oriented. Sensory and motor function are grossly intact.   Assessment: Left total knee arthroplasty  Secondary diagnoses: Hypertension Nephrolithiasis Diverticulosis Coronary artery disease Vertigo Gastroesophageal reflux disease  Plan: Today's goal were reviewed with the patient.  Begin physical therapy and occupational therapy as per total knee arthroplasty rehab protocol. Plan is to go Skilled nursing facility after hospital stay. DVT Prophylaxis - Lovenox, Foot Pumps and TED hose  Sahib Pella P. Angie Fava M.D.

## 2017-01-23 NOTE — Discharge Summary (Addendum)
Physician Discharge Summary  Patient ID: Katelyn Crawford MRN: [id number] DOB/AGE: [birth year] 78 y.o.  Admit date: 01/22/2017 Discharge date: 01/25/2017  Admission Diagnoses:  PRIMARY OSTEOARTHRITIS OF LEFT KNEE   Discharge Diagnoses: Patient Active Problem List   Diagnosis Date Noted  . Chronic sinus bradycardia 01/22/2017  . Degenerative disc disease, lumbar 01/22/2017  . Dizziness 01/22/2017  . Nephrolithiasis 01/22/2017  . Osteoporosis, 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/22/2017  . PVC's (premature ventricular contractions) 01/22/2017  . S/P total knee arthroplasty 01/22/2017  . 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 CKD (chronic kidney disease), 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  . GERD (gastroesophageal reflux disease) 06/25/2013  . Coronary artery disease involving native coronary artery of native heart 06/24/2013  . Essential hypertension 06/21/2013  . Mixed hyperlipidemia 06/21/2013    Past Medical History:  Diagnosis Date  . Arthritis   . Coronary artery disease   . GERD (gastroesophageal reflux disease)   . H/O vertigo   . Heart disease   . History of diverticulosis   . History of kidney stones   . History of seasonal allergies   . Hypertension   . Vascular disease      Transfusion: No transfusions during this admission   Consultants (if any):   Discharged Condition: Improved  Hospital Course: Katelyn Crawford is an 77 y.o. female who was admitted 01/22/2017 with a diagnosis of degenerative arthrosis left knee and went to the operating room on 01/22/2017 and underwent the above named procedures.    Surgeries:Procedure(s): COMPUTER ASSISTED TOTAL KNEE ARTHROPLASTY on 01/22/2017  PRE-OPERATIVE DIAGNOSIS: Degenerative arthrosis of the left knee, primary  POST-OPERATIVE DIAGNOSIS:  Same  PROCEDURE:  Left total knee arthroplasty using computer-assisted navigation  SURGEON:  Jena Gauss. M.D.  ASSISTANT:  Van Clines, PA (present and scrubbed throughout the case, critical for  assistance with exposure, retraction, instrumentation, and closure)  ANESTHESIA: spinal and general  ESTIMATED BLOOD LOSS: 50 mL  FLUIDS REPLACED: 800 mL of crystalloid  TOURNIQUET TIME: 88 minutes  DRAINS: 2 medium Hemovac drains  SOFT TISSUE RELEASES: Anterior cruciate ligament, posterior cruciate ligament, deep medial collateral ligament, patellofemoral ligament  IMPLANTS UTILIZED: DePuy Attune size 4 posterior stabilized femoral component (cemented), size 4 rotating platform tibial component (cemented), 35 mm medialized dome patella (cemented), and a 6 mm stabilized rotating platform polyethylene insert.  INDICATIONS FOR SURGERY: Katelyn Crawford is a 77 y.o. year old female with a long history of progressive knee pain. X-rays demonstrated severe degenerative changes in tricompartmental fashion. The patient had not seen any significant improvement despite conservative nonsurgical intervention. After discussion of the risks and benefits of surgical intervention, the patient expressed understanding of the risks benefits and agree with plans for total knee arthroplasty.   The risks, benefits, and alternatives were discussed at length including but not limited to the risks of infection, bleeding, nerve injury, stiffness, blood clots, the need for revision surgery, cardiopulmonary complications, among others, and they were willing to proceed.   Patient tolerated the surgery well. No complications .Patient was taken to PACU where she was stabilized and then transferred to the orthopedic floor.  Patient started on Lovenox 30 mg q 12 hrs. Foot pumps applied bilaterally at 80 mm hgb. Heels elevated off bed with rolled towels. No evidence of DVT. Calves non tender. Negative Homan. Physical therapy started on day #1 for gait training and transfer with OT starting on  day #1 for ADL and assisted devices. Patient has done well with therapy. Ambulated 30  feet upon being  discharged.  Patient's IV  And Foley were discontinued on day #1 with Hemovac being discontinued on day #2. Dressing was changed on day 2 prior to patient being discharged  On postop day 3, patient was medically stable and ready for discharge to skilled nursing facility.   She was given perioperative antibiotics:  Anti-infectives (From admission, onward)   Start     Dose/Rate Route Frequency Ordered Stop   01/22/17 1015  clindamycin (CLEOCIN) IVPB 600 mg     600 mg 100 mL/hr over 30 Minutes Intravenous Every 6 hours 01/22/17 1006 01/23/17 0559   01/22/17 0600  clindamycin (CLEOCIN) IVPB 900 mg  Status:  Discontinued     900 mg 100 mL/hr over 30 Minutes Intravenous On call to O.R. 01/21/17 2128 01/22/17 0945    .  She was fitted with AV 1 compression foot pump devices, instructed on heel pumps, early ambulation, and fitted with TED stockings bilaterally for DVT prophylaxis.  She benefited maximally from the hospital stay and there were no complications.    Recent vital signs:  Vitals:   01/25/17 0338 01/25/17 0713  BP: 139/62 (!) 148/84  Pulse: 70 72  Resp: 19 17  Temp: 97.7 F (36.5 C) 97.6 F (36.4 C)  SpO2: 97% 100%    Recent laboratory studies:  Lab Results  Component Value Date   HGB 12.2 01/08/2017   HGB 11.9 (L) 01/24/2015   Lab Results  Component Value Date   WBC 3.9 01/08/2017   PLT 200 01/08/2017   Lab Results  Component Value Date   INR 0.96 01/08/2017   Lab Results  Component Value Date   NA 134 (L) 01/08/2017   K 4.5 01/08/2017   CL 101 01/08/2017   CO2 26 01/08/2017   BUN 20 01/08/2017   CREATININE 0.89 01/08/2017   GLUCOSE 99 01/08/2017    Discharge Medications:   Allergies as of 01/25/2017      Reactions   Penicillins    Has patient had a PCN reaction causing immediate rash, facial/tongue/throat swelling, SOB or lightheadedness with hypotension: no Has patient had a PCN reaction causing severe rash involving mucus membranes or skin necrosis: no Has patient had a PCN  reaction that required hospitalization: no Has patient had a PCN reaction occurring within the last 10 years: no If all of the above answers are "NO", then may proceed with Cephalosporin use.   Sulfa Antibiotics    "UNKNOWN"      Medication List    TAKE these medications   aspirin 81 MG tablet Take 81 mg by mouth daily.   atorvastatin 40 MG tablet Commonly known as:  LIPITOR Take 40 mg by mouth daily at 6 PM.   CALCIUM/MAGNESIUM/ZINC PO Take 1 tablet by mouth daily.   cetirizine 10 MG tablet Commonly known as:  ZYRTEC Take 10 mg by mouth at bedtime.   COENZYME Q10-OMEGA 3 FATTY ACD PO Take 2 tablets by mouth daily.   enoxaparin 40 MG/0.4ML injection Commonly known as:  LOVENOX Inject 0.4 mLs (40 mg total) into the skin daily for 14 days.   fluticasone 50 MCG/ACT nasal spray Commonly known as:  FLONASE Place 2 sprays into both nostrils daily.   losartan 50 MG tablet Commonly known as:  COZAAR Take 50 mg by mouth daily.   MULTIVITAMIN ADULT Tabs Take 1 tablet by mouth daily.   omeprazole 20 MG capsule Commonly known as:  PRILOSEC Take 20 mg by mouth daily as needed.   oxyCODONE 5  MG immediate release tablet Commonly known as:  Oxy IR/ROXICODONE Take 1 tablet (5 mg total) by mouth every 3 (three) hours as needed for moderate pain ((score 4 to 6)).   sertraline 50 MG tablet Commonly known as:  ZOLOFT Take 25 mg by mouth at bedtime.   SUPER B COMPLEX PO Take 1 capsule by mouth daily.   traMADol 50 MG tablet Commonly known as:  ULTRAM Take 1-2 tablets (50-100 mg total) by mouth every 4 (four) hours as needed for moderate pain.   vitamin C 500 MG tablet Commonly known as:  ASCORBIC ACID Take 500 mg by mouth daily.   Vitamin D-3 1000 units Caps Take 1,000 Int'l Units by mouth daily.            Durable Medical Equipment  (From admission, onward)        Start     Ordered   01/22/17 1629  DME Walker rolling  Once    Question:  Patient needs a  walker to treat with the following condition  Answer:  Total knee replacement status   01/22/17 1628   01/22/17 1629  DME Bedside commode  Once    Question:  Patient needs a bedside commode to treat with the following condition  Answer:  Total knee replacement status   01/22/17 1628      Diagnostic Studies: Dg Knee Left Port  Result Date: 01/22/2017 CLINICAL DATA:  Status post left knee replacement EXAM: PORTABLE LEFT KNEE - 1-2 VIEW COMPARISON:  08/25/2014 FINDINGS: Left knee prosthesis is noted. Surgical drains are noted in place. No acute bony or soft tissue abnormality is noted. IMPRESSION: Status post left knee replacement. Electronically Signed   By: Alcide Clever M.D.   On: 01/22/2017 15:31    Disposition: 01-Home or Self Care  Discharge Instructions    Increase activity slowly   Complete by:  As directed        Contact information for follow-up providers    Tera Partridge, PA On 02/06/2017.   Specialty:  Physician Assistant Why:  at 1:45pm Contact information: [address] [facility] [place] [address] [phone]        Donato Heinz, MD On 03/06/2017.   Specialty:  Orthopedic Surgery Why:  at 11:15am Contact information: [address] [facility] [place] [address] [phone]            Contact information for after-discharge care    Destination    [facility] .   Service:  Skilled Nursing Contact information: [address] [place] [address] [phone]                   Signed: Patience Musca 01/25/2017, 8:34 AM

## 2017-01-23 NOTE — Evaluation (Signed)
Occupational Therapy Evaluation Patient Details Name: Katelyn Crawford MRN: [id number] DOB: 06/06/1940 Today's Date: 01/23/2017    History of Present Illness Pt. is a 77 y.o. female who was admitted to [facility] for a Left TKR. Pt. PMHx includes: HTN, Nephrolithiasis, Diverticulosis, CAD, Vertigo, and GERD.   Clinical Impression   Pt. presents with pain, weakness, and limited functional mobility which hinders her ability to complete ADL, and IADL tasks. Pt. Resides at home alone, and has a support daughter who lives nearby, and can assist pt. ifneeded. Pt. Was independent with ADLs, IADLs, driving, shopping, however these tasks became progressively more difficult secondary to the knee pain. Pt. Education was provided about A/E use for LE ADLs. Pt. Could benefit from OT services for ADL training, A/E training, and pt. Education about home modification, and DME. Pt. Would benefit from SNF level of care upon discharge with follow-up OT services. Pt. Reports she would like to go to [place].    Follow Up Recommendations  SNF    Equipment Recommendations  3 in 1 bedside commode    Recommendations for Other Services       Precautions / Restrictions Precautions Required Braces or Orthoses: Knee Immobilizer - Left Restrictions Weight Bearing Restrictions: Yes LLE Weight Bearing: Weight bearing as tolerated                                                    ADL either performed or assessed with clinical judgement   ADL Overall ADL's : Needs assistance/impaired Eating/Feeding: Set up   Grooming: Set up   Upper Body Bathing: Minimal assistance;Set up   Lower Body Bathing: Maximal assistance;Set up   Upper Body Dressing : Minimal assistance;Set up   Lower Body Dressing: Maximal assistance               Functional mobility during ADLs: (Deferred) General ADL Comments: Pt. and daughter education about A/E use for LE ADLs.     Vision Baseline Vision/History: Wears  glasses Wears Glasses: At all times       Perception     Praxis      Pertinent Vitals/Pain Pain Assessment: 0-10 Pain Score: 4  Pain Descriptors / Indicators: Aching Pain Intervention(s): Limited activity within patient's tolerance;Monitored during session;Premedicated before session     Hand Dominance Right   Extremity/Trunk Assessment Upper Extremity Assessment Upper Extremity Assessment: Generalized weakness           Communication Communication Communication: No difficulties   Cognition Arousal/Alertness: Awake/alert Behavior During Therapy: WFL for tasks assessed/performed Overall Cognitive Status: Within Functional Limits for tasks assessed                                     General Comments       Exercises     Shoulder Instructions      Home Living Family/patient expects to be discharged to:: Private residence Living Arrangements: Alone Available Help at Discharge: Family Type of Home: House Home Access: Stairs to enter Secretary/administrator of Steps: 5   Home Layout: One level     Bathroom Shower/Tub: IT trainer: Standard     Home Equipment: Grab bars - tub/shower;Walker - 2 wheels          Prior  Functioning/Environment Level of Independence: Independent        Comments: Pt. reports being independent with ADL, and IADLs at home, however tasks became progressively more difficult secondary to pain since left knee meniscus surgery in January of 2018        OT Problem List: Decreased strength;Decreased activity tolerance;Decreased range of motion;Pain;Decreased knowledge of use of DME or AE      OT Treatment/Interventions: Self-care/ADL training    OT Goals(Current goals can be found in the care plan section) Acute Rehab OT Goals Patient Stated Goal: To return home OT Goal Formulation: With patient Potential to Achieve Goals: Good  OT Frequency: Min 1X/week   Barriers to D/C:             Co-evaluation              AM-PAC PT "6 Clicks" Daily Activity     Outcome Measure Help from another person eating meals?: None Help from another person taking care of personal grooming?: None Help from another person toileting, which includes using toliet, bedpan, or urinal?: A Lot Help from another person bathing (including washing, rinsing, drying)?: A Lot Help from another person to put on and taking off regular upper body clothing?: A Little Help from another person to put on and taking off regular lower body clothing?: A Lot 6 Click Score: 17   End of Session    Activity Tolerance: Patient tolerated treatment well Patient left: in bed;with call bell/phone within reach  OT Visit Diagnosis: Muscle weakness (generalized) (M62.81)                Time: 6160-7371 OT Time Calculation (min): 30 min Charges:  OT General Charges $OT Visit: 1 Visit OT Evaluation $OT Eval Moderate Complexity: 1 Mod G-Codes:    Olegario Messier, MS, OTR/L   Olegario Messier, MS, OTR/L 01/23/2017, 10:06 AM

## 2017-01-24 ENCOUNTER — Encounter
Admission: RE | Admit: 2017-01-24 | Discharge: 2017-01-24 | Disposition: A | Discharge status: Home / Self Care | Payer: Medicare Other | Source: Ambulatory Visit | Attending: Internal Medicine | Admitting: Internal Medicine

## 2017-01-24 NOTE — Progress Notes (Signed)
Physical Therapy Treatment Patient Details Name: Katelyn Crawford MRN: [id number] DOB: 07-25-1940 Today's Date: 01/24/2017    History of Present Illness 77 y.o. female s/p L TKA 01/22/17. PMHx includes: HTN, Nephrolithiasis, Diverticulosis, CAD, Vertigo, and GERD.    PT Comments    Pt did much better with all aspects of PT including much improved speed and cadence with ambulation.  She reports that she did her exercises hourly while awake last night and generally is feeling good despite some increased pain.  Pt was eager to work with PT and despite needing a lot of cuing she ultimately did well.   Follow Up Recommendations  SNF     Equipment Recommendations       Recommendations for Other Services       Precautions / Restrictions Precautions Precautions: Knee;Fall Required Braces or Orthoses: Knee Immobilizer - Left Restrictions LLE Weight Bearing: Weight bearing as tolerated    Mobility  Bed Mobility               General bed mobility comments: Pt in recliner, not tested  Transfers Overall transfer level: Modified independent Equipment used: Rolling walker (2 wheeled) Transfers: Sit to/from Stand Sit to Stand: Supervision         General transfer comment: Pt able to rise with good confidence and safety  Ambulation/Gait Ambulation/Gait assistance: Min guard Ambulation Distance (Feet): 80 Feet Assistive device: Rolling walker (2 wheeled)       General Gait Details: Pt was able to ambulate with much greater confidence, cadence and comfort.  She was able to maintain consistent walker movement and speed which she was not able to do yesterday w/o direct PT assist to advance walker.   Stairs            Wheelchair Mobility    Modified Rankin (Stroke Patients Only)       Balance Overall balance assessment: Modified Independent Sitting-balance support: No upper extremity supported Sitting balance-Leahy Scale: Good Sitting balance - Comments: Pt able to  maintain sitting balance at EOB well   Standing balance support: Bilateral upper extremity supported Standing balance-Leahy Scale: Good                              Cognition Arousal/Alertness: Awake/alert Behavior During Therapy: WFL for tasks assessed/performed Overall Cognitive Status: Within Functional Limits for tasks assessed                                        Exercises Total Joint Exercises Ankle Circles/Pumps: AROM;10 reps Quad Sets: Strengthening;10 reps Gluteal Sets: Strengthening;10 reps Short Arc Quad: Strengthening;10 reps Heel Slides: Strengthening;10 reps Hip ABduction/ADduction: Strengthening;10 reps Straight Leg Raises: AROM;10 reps Long Arc Quad: Strengthening;10 reps Knee Flexion: PROM;5 reps Goniometric ROM: 0-93    General Comments        Pertinent Vitals/Pain Pain Assessment: 0-10 Pain Score: 6  Pain Location: Pt reports she was quite sore over night Pain Descriptors / Indicators: Aching Pain Intervention(s): Limited activity within patient's tolerance;Monitored during session;Repositioned    Home Living                      Prior Function            PT Goals (current goals can now be found in the care plan section) Acute Rehab PT Goals Patient Stated  Goal: get back to walking Progress towards PT goals: Progressing toward goals    Frequency    BID      PT Plan Current plan remains appropriate    Co-evaluation              AM-PAC PT "6 Clicks" Daily Activity  Outcome Measure  Difficulty turning over in bed (including adjusting bedclothes, sheets and blankets)?: A Little Difficulty moving from lying on back to sitting on the side of the bed? : A Lot Difficulty sitting down on and standing up from a chair with arms (e.g., wheelchair, bedside commode, etc,.)?: None Help needed moving to and from a bed to chair (including a wheelchair)?: None Help needed walking in hospital room?: A  Little Help needed climbing 3-5 steps with a railing? : A Lot 6 Click Score: 18    End of Session Equipment Utilized During Treatment: Gait belt Activity Tolerance: Patient tolerated treatment well     PT Visit Diagnosis: Muscle weakness (generalized) (M62.81);Difficulty in walking, not elsewhere classified (R26.2)     Time: 1610-9604 PT Time Calculation (min) (ACUTE ONLY): 26 min  Charges:  $Gait Training: 8-22 mins $Therapeutic Exercise: 8-22 mins                    G Codes:       Malachi Pro, DPT 01/24/2017, 12:31 PM

## 2017-01-24 NOTE — Progress Notes (Signed)
Physical Therapy Treatment Patient Details Name: Katelyn Crawford MRN: [id number] DOB: [birth year] Today's Date: 01/24/2017    History of Present Illness 77 y.o. female s/p L TKA 01/22/17. PMHx includes: HTN, Nephrolithiasis, Diverticulosis, CAD, Vertigo, and GERD.    PT Comments    Pt continues to do well with ROM, transfers, ambulation and though she c/o 4-6/10 pain most of the time is nor overly limited by it.  She had some fatigue with >75 ft of ambulation but was able to maintain speed and cadence. Pt eager to work with PT and strong with LE exercises.    Follow Up Recommendations  SNF     Equipment Recommendations       Recommendations for Other Services       Precautions / Restrictions Precautions Precautions: Knee;Fall Restrictions LLE Weight Bearing: Weight bearing as tolerated    Mobility  Bed Mobility               General bed mobility comments: In recliner, returned to recliner  Transfers Overall transfer level: Modified independent Equipment used: Rolling walker (2 wheeled) Transfers: Sit to/from Stand Sit to Stand: Supervision         General transfer comment: Pt again able to rise w/o direct assist, cuing only for foot/hand set up and sequencing.  Ambulation/Gait Ambulation/Gait assistance: Supervision Ambulation Distance (Feet): 85 Feet Assistive device: Rolling walker (2 wheeled)       General Gait Details: Pt again with relatively smooth and confident cadence and improving speed.  She still has some L knee bed during heel strike with minimal hesitation, but ultimately has made great gains with ambulation over the last 24 hours.    Stairs            Wheelchair Mobility    Modified Rankin (Stroke Patients Only)       Balance Overall balance assessment: Modified Independent   Sitting balance-Leahy Scale: Good       Standing balance-Leahy Scale: Good                              Cognition Arousal/Alertness:  Awake/alert Behavior During Therapy: WFL for tasks assessed/performed Overall Cognitive Status: Within Functional Limits for tasks assessed                                        Exercises Total Joint Exercises Ankle Circles/Pumps: AROM;10 reps Quad Sets: Strengthening;15 reps Gluteal Sets: Strengthening;15 reps Short Arc Quad: Strengthening;15 reps Heel Slides: Strengthening;15 reps Hip ABduction/ADduction: Strengthening;15 reps Straight Leg Raises: AROM;10 reps Long Arc Quad: Strengthening;10 reps Knee Flexion: PROM;5 reps    General Comments        Pertinent Vitals/Pain Pain Score: 4     Home Living                      Prior Function            PT Goals (current goals can now be found in the care plan section) Progress towards PT goals: Progressing toward goals    Frequency    BID      PT Plan Current plan remains appropriate    Co-evaluation              AM-PAC PT "6 Clicks" Daily Activity  Outcome Measure  Difficulty turning over in bed (including adjusting  bedclothes, sheets and blankets)?: A Little Difficulty moving from lying on back to sitting on the side of the bed? : A Little Difficulty sitting down on and standing up from a chair with arms (e.g., wheelchair, bedside commode, etc,.)?: None Help needed moving to and from a bed to chair (including a wheelchair)?: None Help needed walking in hospital room?: None Help needed climbing 3-5 steps with a railing? : A Little 6 Click Score: 21    End of Session Equipment Utilized During Treatment: Gait belt Activity Tolerance: Patient tolerated treatment well     PT Visit Diagnosis: Muscle weakness (generalized) (M62.81);Difficulty in walking, not elsewhere classified (R26.2)     Time: 1435-1500 PT Time Calculation (min) (ACUTE ONLY): 25 min  Charges:  $Gait Training: 8-22 mins $Therapeutic Exercise: 8-22 mins                    G Codes:       Malachi Pro,  DPT 01/24/2017, 4:13 PM

## 2017-01-24 NOTE — Progress Notes (Signed)
Occupational Therapy Treatment Patient Details Name: Katelyn Crawford MRN: [id number] DOB: Oct 01, 1940 Today's Date: 01/24/2017    History of present illness 77 y.o. female s/p L TKA 01/22/17. PMHx includes: HTN, Nephrolithiasis, Diverticulosis, CAD, Vertigo, and GERD.   OT comments  Patient seen for OT treatment session this date, she reports mild pain in lower extremity from surgery.  Able to demonstrate doffing socks without adaptive equipment with adaptive equipment with min assist and cues, donning with sockaide with cues for how to place sock on aide and cues for technique.   Lower body dressing techniques with reacher with therapist demo and verbal instruction and patient demonstration to don and doff using hemi dressing techniques and use of AE.  Issued written handout with pictures regarding donning/doffing socks and managing underwear and pants.  Patient is scheduled to discharge tomorrow to go to SNF and would continue to benefit from skilled OT to increase independence in daily tasks prior to returning home.  Follow Up Recommendations  SNF    Equipment Recommendations  3 in 1 bedside commode    Recommendations for Other Services      Precautions / Restrictions Precautions Precautions: Knee;Fall Required Braces or Orthoses: Knee Immobilizer - Left Restrictions Weight Bearing Restrictions: Yes LLE Weight Bearing: Weight bearing as tolerated       Mobility Bed Mobility                  Transfers Overall transfer level: Needs assistance Equipment used: Rolling walker (2 wheeled) Transfers: Sit to/from Stand Sit to Stand: Min guard              Balance Overall balance assessment: Modified Independent Sitting-balance support: No upper extremity supported Sitting balance-Leahy Scale: Good Sitting balance - Comments: Pt able to maintain sitting balance at EOB well   Standing balance support: Bilateral upper extremity supported Standing balance-Leahy Scale: Fair                              ADL either performed or assessed with clinical judgement   ADL Overall ADL's : Needs assistance/impaired     Grooming: Min guard;Standing               Lower Body Dressing: Minimal assistance;With adaptive equipment   Toilet Transfer: Minimal assistance   Toileting- Clothing Manipulation and Hygiene: Minimal assistance       Functional mobility during ADLs: Min guard General ADL Comments: Pt. and daughter education about A/E use for LE ADLs.     Vision       Perception     Praxis      Cognition Arousal/Alertness: Awake/alert Behavior During Therapy: WFL for tasks assessed/performed Overall Cognitive Status: Within Functional Limits for tasks assessed                                          Exercises     Shoulder Instructions       General Comments      Pertinent Vitals/ Pain       Pain Assessment: 0-10 Pain Score: 2  Pain Location: L knee, pain increases with activity Pain Descriptors / Indicators: Aching Pain Intervention(s): Limited activity within patient's tolerance;Monitored during session;Repositioned  Home Living  Prior Functioning/Environment              Frequency  Min 1X/week        Progress Toward Goals  OT Goals(current goals can now be found in the care plan section)  Progress towards OT goals: Progressing toward goals  Acute Rehab OT Goals Patient Stated Goal: get back to walking OT Goal Formulation: With patient Potential to Achieve Goals: Good  Plan Discharge plan remains appropriate    Co-evaluation                 AM-PAC PT "6 Clicks" Daily Activity     Outcome Measure                    End of Session Equipment Utilized During Treatment: Gait belt;Rolling walker  OT Visit Diagnosis: Muscle weakness (generalized) (M62.81)   Activity Tolerance Patient tolerated treatment well    Patient Left with call bell/phone within reach;in chair;with family/visitor present   Nurse Communication          Time: 1610-9604 OT Time Calculation (min): 33 min  Charges: OT General Charges $OT Visit: 1 Visit OT Treatments $Self Care/Home Management : 23-37 mins  Hobie Kohles T Jahson Emanuele, OTR/L, CLT    Desia Saban 01/24/2017, 10:58 AM

## 2017-01-24 NOTE — Progress Notes (Addendum)
Plan is for patient to D/C to [facility] tomorrow pending medical clearance. Per [facility] admissions coordinator at [facility] patient can come tomorrow to room 204-A. Clinical Child psychotherapist (CSW) sent D/C orders to [facility] [employer] [facility] via [place] today. Patient is aware of above.    [employer], LCSW [phone]

## 2017-01-24 NOTE — Progress Notes (Signed)
   Subjective: 2 Days Post-Op Procedure(s) (LRB): COMPUTER ASSISTED TOTAL KNEE ARTHROPLASTY (Left) Patient reports pain as moderate.   Patient is well, and has had no acute complaints or problems Patient did fair with physical therapy yesterday. Was noted to have some slight dizziness Plan is to go Rehab after hospital stay. no nausea and no vomiting Patient denies any chest pains or shortness of breath. Objective: Vital signs in last 24 hours: Temp:  [97.6 F (36.4 C)-97.9 F (36.6 C)] 97.9 F (36.6 C) (01/04 0436) Pulse Rate:  [67-74] 67 (01/04 0436) Resp:  [16-20] 16 (01/04 0436) BP: (128-151)/(51-58) 151/58 (01/04 0436) SpO2:  [94 %-96 %] 94 % (01/04 0436) well approximated incision Heels are non tender and elevated off the bed using rolled towels Intake/Output from previous day: 01/03 0701 - 01/04 0700 In: 1170 [P.O.:720; I.V.:325] Out: 525 [Urine:375; Drains:150] Intake/Output this shift: No intake/output data recorded.  No results for input(s): HGB in the last 72 hours. No results for input(s): WBC, RBC, HCT, PLT in the last 72 hours. No results for input(s): NA, K, CL, CO2, BUN, CREATININE, GLUCOSE, CALCIUM in the last 72 hours. No results for input(s): LABPT, INR in the last 72 hours.  EXAM General - Patient is Alert, Appropriate and Oriented Extremity - Neurologically intact Neurovascular intact Sensation intact distally Intact pulses distally Dorsiflexion/Plantar flexion intact No cellulitis present Compartment soft Dressing - scant drainage Motor Function - intact, moving foot and toes well on exam.    Past Medical History:  Diagnosis Date  . Arthritis   . Coronary artery disease   . GERD (gastroesophageal reflux disease)   . H/O vertigo   . Heart disease   . History of diverticulosis   . History of kidney stones   . History of seasonal allergies   . Hypertension   . Vascular disease     Assessment/Plan: 2 Days Post-Op Procedure(s)  (LRB): COMPUTER ASSISTED TOTAL KNEE ARTHROPLASTY (Left) Active Problems:   S/P total knee arthroplasty  Estimated body mass index is 31.77 kg/m as calculated from the following:   Height as of this encounter: 4\' 10"  (1.473 m).   Weight as of this encounter: 68.9 kg (152 lb). Up with therapy Plan for discharge tomorrow Discharge to SNF  Labs: None DVT Prophylaxis - Lovenox, Foot Pumps and TED hose Weight-Bearing as tolerated to left leg Hemovac was discontinued on today's visit. If that Hemovac appeared to be intact. Please wash the operative leg and apply TED stockings to the left leg.  Patient needs a bowel movement   Yasir Kitner R. [facility] PA [facility] 01/24/2017, 8:20 AM

## 2017-01-25 MED ORDER — ENOXAPARIN SODIUM 40 MG/0.4ML ~~LOC~~ SOLN: 40.0000 mg | SUBCUTANEOUS | 0 refills | Status: DC

## 2017-01-25 NOTE — Progress Notes (Signed)
   Subjective: 3 Days Post-Op Procedure(s) (LRB): COMPUTER ASSISTED TOTAL KNEE ARTHROPLASTY (Left) Patient reports pain as mild.  Pain is improved from yesterday.  Patient states she slept very well last night. Slight dizziness yesterday has improved. Plan is to go Rehab today. Patient denies any chest pain, shortness of breath, nausea or vomiting.  Objective: Vital signs in last 24 hours: Temp:  [97.6 F (36.4 C)-98.6 F (37 C)] 97.6 F (36.4 C) (01/05 0713) Pulse Rate:  [70-77] 72 (01/05 0713) Resp:  [17-19] 17 (01/05 0713) BP: (139-153)/(62-84) 148/84 (01/05 0713) SpO2:  [97 %-100 %] 100 % (01/05 0713)  Intake/Output from previous day: 01/04 0701 - 01/05 0700 In: 360 [P.O.:360] Out: 258 [Urine:255; Stool:3] Intake/Output this shift: No intake/output data recorded.  No results for input(s): HGB in the last 72 hours. No results for input(s): WBC, RBC, HCT, PLT in the last 72 hours. No results for input(s): NA, K, CL, CO2, BUN, CREATININE, GLUCOSE, CALCIUM in the last 72 hours. No results for input(s): LABPT, INR in the last 72 hours.  EXAM General - Patient is Alert, Appropriate and Oriented Extremity - Neurologically intact Neurovascular intact Sensation intact distally Intact pulses distally Dorsiflexion/Plantar flexion intact No cellulitis present Compartment soft Dressing - scant drainage Motor Function - intact, moving foot and toes well on exam.    Past Medical History:  Diagnosis Date  . Arthritis   . Coronary artery disease   . GERD (gastroesophageal reflux disease)   . H/O vertigo   . Heart disease   . History of diverticulosis   . History of kidney stones   . History of seasonal allergies   . Hypertension   . Vascular disease     Assessment/Plan: 3 Days Post-Op Procedure(s) (LRB): COMPUTER ASSISTED TOTAL KNEE ARTHROPLASTY (Left) Active Problems:   S/P total knee arthroplasty  Estimated body mass index is 31.77 kg/m as calculated from the  following:   Height as of this encounter: 4\' 10"  (1.473 m).   Weight as of this encounter: 152 lb (68.9 kg). Up with therapy Discharge to SNF today Follow-up with [facility] in 2 weeks for staple removal TED hose bilateral lower extremities times 6 weeks, remove at nighttime. Lovenox 40 mg sq daily times 14 days  DVT Prophylaxis - Lovenox, Foot Pumps and TED hose Weight-Bearing as tolerated to left leg  Evon Slack PA-C [facility] 01/25/2017, 8:28 AM

## 2017-01-25 NOTE — Progress Notes (Signed)
Physical Therapy Treatment Patient Details Name: Katelyn Crawford MRN: [id number] DOB: 1940-04-20 Today's Date: 01/25/2017    History of Present Illness 77 y.o. female s/p L TKA 01/22/17. PMHx includes: HTN, Nephrolithiasis, Diverticulosis, CAD, Vertigo, and GERD.    PT Comments    Pt in recliner, awaiting transfer to SNF.  Participated in exercises as described below.   Pt was able to stand and ambulate around nursing unit x 1 this am.  Motivated to increase mobility skills and "get on with this".  Pt tolerated well with overall improvements in strength and mobility skills.  No further questions prior to transfer to rehab.   Follow Up Recommendations  SNF     Equipment Recommendations       Recommendations for Other Services       Precautions / Restrictions Precautions Precautions: Knee;Fall Required Braces or Orthoses: Knee Immobilizer - Left Restrictions Weight Bearing Restrictions: Yes LLE Weight Bearing: Weight bearing as tolerated    Mobility  Bed Mobility               General bed mobility comments: In recliner, returned to recliner  Transfers Overall transfer level: Modified independent Equipment used: Rolling walker (2 wheeled) Transfers: Sit to/from Stand Sit to Stand: Supervision         General transfer comment: Pt again able to rise w/o direct assist, cuing only for foot/hand set up and sequencing.  Ambulation/Gait Ambulation/Gait assistance: Supervision;Min guard Ambulation Distance (Feet): 200 Feet Assistive device: Rolling walker (2 wheeled) Gait Pattern/deviations: Step-through pattern;Decreased step length - right;Decreased step length - left   Gait velocity interpretation: Below normal speed for age/gender     Stairs            Wheelchair Mobility    Modified Rankin (Stroke Patients Only)       Balance Overall balance assessment: Modified Independent Sitting-balance support: No upper extremity supported Sitting balance-Leahy  Scale: Good Sitting balance - Comments: Pt able to maintain sitting balance at EOB well   Standing balance support: Bilateral upper extremity supported Standing balance-Leahy Scale: Fair                              Cognition Arousal/Alertness: Awake/alert Behavior During Therapy: WFL for tasks assessed/performed Overall Cognitive Status: Within Functional Limits for tasks assessed                                        Exercises Total Joint Exercises Ankle Circles/Pumps: AROM;10 reps Quad Sets: Strengthening;15 reps Gluteal Sets: Strengthening;15 reps Heel Slides: Strengthening;15 reps Straight Leg Raises: AROM;10 reps Long Arc Quad: Strengthening;10 reps Knee Flexion: PROM;5 reps Goniometric ROM: 0-93    General Comments        Pertinent Vitals/Pain Pain Assessment: 0-10 Pain Score: 5  Pain Location: L knee Pain Descriptors / Indicators: Sore Pain Intervention(s): Limited activity within patient's tolerance;Monitored during session    Home Living                      Prior Function            PT Goals (current goals can now be found in the care plan section) Progress towards PT goals: Progressing toward goals    Frequency    BID      PT Plan Current plan remains appropriate  Co-evaluation              AM-PAC PT "6 Clicks" Daily Activity  Outcome Measure  Difficulty turning over in bed (including adjusting bedclothes, sheets and blankets)?: A Little Difficulty moving from lying on back to sitting on the side of the bed? : A Little Difficulty sitting down on and standing up from a chair with arms (e.g., wheelchair, bedside commode, etc,.)?: None Help needed moving to and from a bed to chair (including a wheelchair)?: None Help needed walking in hospital room?: A Little Help needed climbing 3-5 steps with a railing? : A Little 6 Click Score: 20    End of Session Equipment Utilized During Treatment: Gait  belt Activity Tolerance: Patient tolerated treatment well Patient left: in chair;with call bell/phone within reach;with family/visitor present         Time: 1610-9604 PT Time Calculation (min) (ACUTE ONLY): 25 min  Charges:  $Gait Training: 8-22 mins $Therapeutic Exercise: 8-22 mins                    G Codes:       Danielle Dess, PTA 01/25/17, 10:22 AM

## 2017-01-25 NOTE — Clinical Social Work Note (Signed)
The patient will discharge to [facility], Room 204-A, Report call number [phone]. The patient will transport by EMS, and the facility and family (daughter Marylene Land) are aware and in agreement. The facility has received all documentation, and the packet has been delivered to the chart including hard scripts and the H+P. CSW is signing off. Please consult should additional needs arise.  Argentina Ponder, MSW, Theresia Majors [phone]

## 2017-01-25 NOTE — Progress Notes (Signed)
Patient discharging to [facility]. Report called to [facility] at facility. EMS called. Waiting on transport.

## 2017-01-28 ENCOUNTER — Other Ambulatory Visit
Admission: RE | Admit: 2017-01-28 | Discharge: 2017-01-28 | Disposition: A | Discharge status: Home / Self Care | Payer: Medicare Other | Source: Ambulatory Visit | Attending: Gerontology | Admitting: Gerontology

## 2017-01-28 DIAGNOSIS — Z471 Aftercare following joint replacement surgery: Secondary | ICD-10-CM | POA: Diagnosis present

## 2017-01-28 LAB — URINALYSIS, COMPLETE (UACMP) WITH MICROSCOPIC
Bacteria, UA: NONE SEEN
Bilirubin Urine: NEGATIVE
GLUCOSE, UA: NEGATIVE mg/dL
Hgb urine dipstick: NEGATIVE
KETONES UR: NEGATIVE mg/dL
LEUKOCYTES UA: NEGATIVE
NITRITE: NEGATIVE
PH: 6 (ref 5.0–8.0)
Protein, ur: NEGATIVE mg/dL
Specific Gravity, Urine: 1.013 (ref 1.005–1.030)

## 2017-01-28 LAB — CBC WITH DIFFERENTIAL/PLATELET
Basophils Absolute: 0 10*3/uL (ref 0–0.1)
Basophils Relative: 1 %
EOS ABS: 0.2 10*3/uL (ref 0–0.7)
Eosinophils Relative: 4 %
HCT: 27.2 % — ABNORMAL LOW (ref 35.0–47.0)
HEMOGLOBIN: 9.1 g/dL — AB (ref 12.0–16.0)
LYMPHS ABS: 1.4 10*3/uL (ref 1.0–3.6)
LYMPHS PCT: 28 %
MCH: 28.2 pg (ref 26.0–34.0)
MCHC: 33.5 g/dL (ref 32.0–36.0)
MCV: 84.2 fL (ref 80.0–100.0)
MONOS PCT: 13 %
Monocytes Absolute: 0.6 10*3/uL (ref 0.2–0.9)
NEUTROS PCT: 54 %
Neutro Abs: 2.8 10*3/uL (ref 1.4–6.5)
Platelets: 196 10*3/uL (ref 150–440)
RBC: 3.23 MIL/uL — ABNORMAL LOW (ref 3.80–5.20)
RDW: 14.9 % — ABNORMAL HIGH (ref 11.5–14.5)
WBC: 5.1 10*3/uL (ref 3.6–11.0)

## 2017-01-28 LAB — COMPREHENSIVE METABOLIC PANEL
ALK PHOS: 78 U/L (ref 38–126)
ALT: 28 U/L (ref 14–54)
ANION GAP: 6 (ref 5–15)
AST: 36 U/L (ref 15–41)
Albumin: 3 g/dL — ABNORMAL LOW (ref 3.5–5.0)
BILIRUBIN TOTAL: 0.8 mg/dL (ref 0.3–1.2)
BUN: 17 mg/dL (ref 6–20)
CALCIUM: 8.8 mg/dL — AB (ref 8.9–10.3)
CO2: 27 mmol/L (ref 22–32)
CREATININE: 0.88 mg/dL (ref 0.44–1.00)
Chloride: 103 mmol/L (ref 101–111)
Glucose, Bld: 106 mg/dL — ABNORMAL HIGH (ref 65–99)
Potassium: 4 mmol/L (ref 3.5–5.1)
SODIUM: 136 mmol/L (ref 135–145)
TOTAL PROTEIN: 5.9 g/dL — AB (ref 6.5–8.1)

## 2017-01-30 LAB — URINE CULTURE

## 2017-02-05 ENCOUNTER — Non-Acute Institutional Stay (SKILLED_NURSING_FACILITY): Payer: Medicare Other | Admitting: Gerontology

## 2017-02-05 ENCOUNTER — Encounter: Payer: Self-pay | Admitting: Gerontology

## 2017-02-05 DIAGNOSIS — M1712 Unilateral primary osteoarthritis, left knee: Secondary | ICD-10-CM | POA: Diagnosis not present

## 2017-02-05 DIAGNOSIS — Z96652 Presence of left artificial knee joint: Secondary | ICD-10-CM | POA: Diagnosis not present

## 2017-02-05 NOTE — Progress Notes (Signed)
Location:   [facility] Room Number: [phone] Place of Service:  SNF [phone])  Provider: Lorenso Quarry, NP-C  PCP: Marguarite Arbour, MD Patient Care Team: Marguarite Arbour, MD as PCP - General (Internal Medicine)  Extended Emergency Contact Information Primary Emergency Contact: Chucky May Address: [address]          [place], [place] [address] D[place] of [place] Home Phone: [phone] Mobile Phone: [phone] Relation: Daughter  Code Status: FULL Goals of care:  Advanced Directive information Advanced Directives 02/05/2017  Does Patient Have a Medical Advance Directive? Yes  Type of Advance Directive Living will;Healthcare Power of Attorney  Does patient want to make changes to medical advance directive? No - Patient declined  Copy of Healthcare Power of Attorney in Chart? Yes  Would patient like information on creating a medical advance directive? -     Allergies  Allergen Reactions  . Penicillins     Has patient had a PCN reaction causing immediate rash, facial/tongue/throat swelling, SOB or lightheadedness with hypotension: no Has patient had a PCN reaction causing severe rash involving mucus membranes or skin necrosis: no Has patient had a PCN reaction that required hospitalization: no Has patient had a PCN reaction occurring within the last 10 years: no If all of the above answers are "NO", then may proceed with Cephalosporin use.   Gaetana Michaelis Antibiotics     "UNKNOWN"    Chief Complaint  Patient presents with  . Discharge Note    Discharged from SNF    HPI:  77 y.o. female seen today for discharge evaluation. Pt was admitted to the facility for rehab following admission to [facility] for Left Total Knee Replacement. Pt has been participating in PT and OT. She has been progressing well. Pt reports her pain is well controlled on current regimen. Pt reports her appetite is good, voiding well and having regular BMs. Incision is well  approximated. Dressing CDI. Calves soft, supple Negative Homan's sign. Pt report she feels she is ready for discharge. VSS. No other complaints.      Past Medical History:  Diagnosis Date  . Arthritis   . Cardiomyopathy (HCC)    with diastolic dysfunction  . Chronic sinus bradycardia   . Coronary artery disease 06/24/2013   1. 03/10/12:Diffuse 90% stenosis mid right coronary artery treated medically because of inability to cannilate the vessel during cardiac catherization 03/10/2012  . Dizziness    chronic  . Environmental allergies    w/ allergic rhinitis  . GERD (gastroesophageal reflux disease) 06/25/2013  . H/O vertigo   . Heart disease   . History of degenerative disc disease   . History of diverticulosis   . History of kidney stones   . History of seasonal allergies   . Hyperlipidemia, unspecified   . Hypertension   . Nephrolithiasis   . Non-cardiac chest pain   . Osteoporosis, post-menopausal   . PVC's (premature ventricular contractions)   . Vascular disease     Past Surgical History:  Procedure Laterality Date  . APPENDECTOMY    . BREAST EXCISIONAL BIOPSY Left 1984   neg  . BREAST LUMPECTOMY Left [id number]  . COLONOSCOPY WITH PROPOFOL N/A 11/27/2016   Procedure: COLONOSCOPY WITH PROPOFOL;  Surgeon: Toledo, Boykin Nearing, MD;  Location: [facility] ENDOSCOPY;  Service: Gastroenterology;  Laterality: N/A;  . CORONARY ANGIOPLASTY WITH STENT PLACEMENT    . KNEE ARTHROPLASTY Left 01/22/2017   Procedure: COMPUTER ASSISTED TOTAL KNEE ARTHROPLASTY;  Surgeon: Ernest Pine,  Illene Labrador, MD;  Location: [facility] ORS;  Service: Orthopedics;  Laterality: Left;  . KNEE ARTHROSCOPY Left 02/01/2015   Procedure: ARTHROSCOPY KNEE, partial medial & lateral menisectomy,,medial and patelofemoral chondroplasty;  Surgeon: Donato Heinz, MD;  Location: [facility] ORS;  Service: Orthopedics;  Laterality: Left;  . LITHOTRIPSY Right 1995  . RIGHT AXILLARY LIPOMA REMOVAL    . TUBAL LIGATION    . VARICOSE VEIN SURGERY         reports that  has never smoked. she has never used smokeless tobacco. She reports that she does not drink alcohol or use drugs. Social History   Socioeconomic History  . Marital status: Widowed    Spouse name: Not on file  . Number of children: 3  . Years of education: 36  . Highest education level: Some college, no degree  Social Needs  . Financial resource strain: Not on file  . Food insecurity - worry: Not on file  . Food insecurity - inability: Not on file  . Transportation needs - medical: Not on file  . Transportation needs - non-medical: Not on file  Occupational History  . Not on file  Tobacco Use  . Smoking status: Never Smoker  . Smokeless tobacco: Never Used  Substance and Sexual Activity  . Alcohol use: No  . Drug use: No  . Sexual activity: Not Currently  Other Topics Concern  . Not on file  Social History Narrative  . Not on file   Functional Status Survey:    Allergies  Allergen Reactions  . Penicillins     Has patient had a PCN reaction causing immediate rash, facial/tongue/throat swelling, SOB or lightheadedness with hypotension: no Has patient had a PCN reaction causing severe rash involving mucus membranes or skin necrosis: no Has patient had a PCN reaction that required hospitalization: no Has patient had a PCN reaction occurring within the last 10 years: no If all of the above answers are "NO", then may proceed with Cephalosporin use.   . Sulfa Antibiotics     "UNKNOWN"    There are no preventive care reminders to display for this patient.  Medications: Allergies as of 02/05/2017      Reactions   Penicillins    Has patient had a PCN reaction causing immediate rash, facial/tongue/throat swelling, SOB or lightheadedness with hypotension: no Has patient had a PCN reaction causing severe rash involving mucus membranes or skin necrosis: no Has patient had a PCN reaction that required hospitalization: no Has patient had a PCN reaction  occurring within the last 10 years: no If all of the above answers are "NO", then may proceed with Cephalosporin use.   Sulfa Antibiotics    "UNKNOWN"      Medication List        Accurate as of 02/05/17  1:05 PM. Always use your most recent med list.          aspirin EC 81 MG tablet Take 81 mg by mouth daily.   atorvastatin 40 MG tablet Commonly known as:  LIPITOR Take 40 mg by mouth daily at 6 PM.   Calcium-Magnesium-Zinc Tabs Take 1 tablet by mouth daily.   cetirizine 10 MG tablet Commonly known as:  ZYRTEC Take 10 mg by mouth at bedtime.   COENZYME Q10-OMEGA 3 FATTY ACD PO Take 2 tablets by mouth daily.   enoxaparin 40 MG/0.4ML injection Commonly known as:  LOVENOX Inject 0.4 mLs (40 mg total) into the skin daily for 14 days.   ENSURE ENLIVE  PO Take 1 Bottle by mouth 2 (two) times daily between meals.   feeding supplement (PRO-STAT SUGAR FREE 64) Liqd Take 30 mLs by mouth 2 (two) times daily between meals.   ferrous sulfate 325 (65 FE) MG tablet Take 325 mg by mouth daily with breakfast.   fluticasone 50 MCG/ACT nasal spray Commonly known as:  FLONASE Place 2 sprays into both nostrils daily.   losartan 50 MG tablet Commonly known as:  COZAAR Take 50 mg by mouth daily.   MULTIVITAMIN ADULT Tabs Take 1 tablet by mouth daily.   omeprazole 20 MG capsule Commonly known as:  PRILOSEC Take 20 mg by mouth daily as needed.   oxyCODONE 5 MG immediate release tablet Commonly known as:  Oxy IR/ROXICODONE Take 1 tablet (5 mg total) by mouth every 3 (three) hours as needed for moderate pain ((score 4 to 6)).   sertraline 50 MG tablet Commonly known as:  ZOLOFT Take 25 mg by mouth at bedtime.   SUPER B COMPLEX PO Take 1 capsule by mouth daily.   traMADol 50 MG tablet Commonly known as:  ULTRAM Take 1-2 tablets (50-100 mg total) by mouth every 4 (four) hours as needed for moderate pain.   vitamin C 500 MG tablet Commonly known as:  ASCORBIC ACID Take 500  mg by mouth daily.   Vitamin D-3 1000 units Caps Take 1,000 Int'l Units by mouth daily.       Review of Systems  Constitutional: Negative for activity change, appetite change, chills, diaphoresis and fever.  HENT: Negative for congestion, mouth sores, nosebleeds, postnasal drip, sneezing, sore throat, trouble swallowing and voice change.   Respiratory: Negative for apnea, cough, choking, chest tightness, shortness of breath and wheezing.   Cardiovascular: Negative for chest pain, palpitations and leg swelling.  Gastrointestinal: Negative for abdominal distention, abdominal pain, constipation, diarrhea and nausea.  Genitourinary: Negative for difficulty urinating, dysuria, frequency and urgency.  Musculoskeletal: Positive for arthralgias (typical arthritis). Negative for back pain, gait problem and myalgias.  Skin: Positive for wound. Negative for color change, pallor and rash.  Neurological: Negative for dizziness, tremors, syncope, speech difficulty, weakness, numbness and headaches.  Psychiatric/Behavioral: Negative for agitation and behavioral problems.  All other systems reviewed and are negative.   Vitals:   02/05/17 1205  BP: (!) 139/50  Pulse: 74  Resp: 18  Temp: 98.1 F (36.7 C)  TempSrc: Oral  SpO2: 100%  Weight: 155 lb 12.8 oz (70.7 kg)  Height: 4\' 10"  (1.473 m)   Body mass index is 32.56 kg/m. Physical Exam  Constitutional: She is oriented to person, place, and time. Vital signs are normal. She appears well-developed and well-nourished. She is active and cooperative. She does not appear ill. No distress.  HENT:  Head: Normocephalic and atraumatic.  Mouth/Throat: Uvula is midline, oropharynx is clear and moist and mucous membranes are normal. Mucous membranes are not pale, not dry and not cyanotic.  Eyes: Conjunctivae, EOM and lids are normal. Pupils are equal, round, and reactive to light.  Neck: Trachea normal, normal range of motion and full passive range of  motion without pain. Neck supple. No JVD present. No tracheal deviation, no edema and no erythema present. No thyromegaly present.  Cardiovascular: Normal rate, regular rhythm, normal heart sounds, intact distal pulses and normal pulses. Exam reveals no gallop, no distant heart sounds and no friction rub.  No murmur heard. Pulses:      Dorsalis pedis pulses are 2+ on the right side, and 2+ on  the left side.  No edema  Pulmonary/Chest: Effort normal and breath sounds normal. No accessory muscle usage. No respiratory distress. She has no decreased breath sounds. She has no wheezes. She has no rhonchi. She has no rales. She exhibits no tenderness.  Abdominal: Soft. Normal appearance and bowel sounds are normal. She exhibits no distension and no ascites. There is no tenderness.  Musculoskeletal: She exhibits no edema.       Left knee: She exhibits decreased range of motion, swelling and laceration. Tenderness found.  Expected osteoarthritis, stiffness; Bilateral Calves soft, supple. Negative Homan's Sign. B- pedal pulses equal  Neurological: She is alert and oriented to person, place, and time. She has normal strength.  Skin: Skin is warm and dry. Laceration (L TKR) noted. She is not diaphoretic. No cyanosis. No pallor. Nails show no clubbing.  Psychiatric: She has a normal mood and affect. Her speech is normal and behavior is normal. Judgment and thought content normal. Cognition and memory are normal.  Nursing note and vitals reviewed.   Labs reviewed: Basic Metabolic Panel: Recent Labs    01/08/17 0934 01/28/17 0507  NA 134* 136  K 4.5 4.0  CL 101 103  CO2 26 27  GLUCOSE 99 106*  BUN 20 17  CREATININE 0.89 0.88  CALCIUM 9.5 8.8*   Liver Function Tests: Recent Labs    01/08/17 0934 01/28/17 0507  AST 32 36  ALT 24 28  ALKPHOS 82 78  BILITOT 0.6 0.8  PROT 7.1 5.9*  ALBUMIN 4.2 3.0*   No results for input(s): LIPASE, AMYLASE in the last 8760 hours. No results for input(s):  AMMONIA in the last 8760 hours. CBC: Recent Labs    01/08/17 0934 01/28/17 0507  WBC 3.9 5.1  NEUTROABS  --  2.8  HGB 12.2 9.1*  HCT 36.5 27.2*  MCV 84.0 84.2  PLT 200 196   Cardiac Enzymes: No results for input(s): CKTOTAL, CKMB, CKMBINDEX, TROPONINI in the last 8760 hours. BNP: Invalid input(s): POCBNP CBG: No results for input(s): GLUCAP in the last 8760 hours.  Procedures and Imaging Studies During Stay: Dg Knee Left Port  Result Date: 01/22/2017 CLINICAL DATA:  Status post left knee replacement EXAM: PORTABLE LEFT KNEE - 1-2 VIEW COMPARISON:  08/25/2014 FINDINGS: Left knee prosthesis is noted. Surgical drains are noted in place. No acute bony or soft tissue abnormality is noted. IMPRESSION: Status post left knee replacement. Electronically Signed   By: Alcide Clever M.D.   On: 01/22/2017 15:31    Assessment/Plan:   1. Primary osteoarthritis of left knee 2. Status post total left knee replacement  Continue working with PT/OT  Continue exercises as taught by PT/OT  Skin care per protocol  Continuous polar care  Continue Lovenox 40 mg SQ Q Day  Continue Tramadol 50-100 mg po Q 4 hours prn pain  Continue oxycodone 5 mg po Q 3 hours prn pain  Follow up with Orthopedist as instructed for continuity of care   Patient is being discharged with the following home health services: [facility]  Patient is being discharged with the following durable medical equipment: RW, reacher, Sock aid through [facility]  Patient has been advised to f/u with their PCP in 1-2 weeks to bring them up to date on their rehab stay.  Social services at facility was responsible for arranging this appointment.  Pt was provided with a 30 day supply of prescriptions for medications and refills must be obtained from their PCP.  For controlled substances,  a more limited supply may be provided adequate until PCP appointment only.  Future labs/tests needed:    Family/ staff Communication:   Total  Time:  Documentation:  Face to Face:  Family/Phone:  Brynda Rim, NP-C Geriatrics [facility] [address], [place] [address] Cell Phone (Mon-Fri 8am-5pm):  [phone] On Call:  [phone] & follow prompts after 5pm & weekends Office Phone:  [phone] Office Fax:  [phone]

## 2017-02-09 DIAGNOSIS — M1710 Unilateral primary osteoarthritis, unspecified knee: Secondary | ICD-10-CM | POA: Insufficient documentation

## 2017-02-09 DIAGNOSIS — M1610 Unilateral primary osteoarthritis, unspecified hip: Secondary | ICD-10-CM | POA: Insufficient documentation

## 2017-07-24 IMAGING — US US EXTREM LOW VENOUS*L*
1 series · 13 of 24 positions shown · non-contrast
Comparison: None.

CLINICAL DATA: Left leg pain.  Progressive left calf tenderness.



[Series 1: us extrem low venous*left* · 0.07mm/px · 13 of 42 slices shown]
[im 1/42]
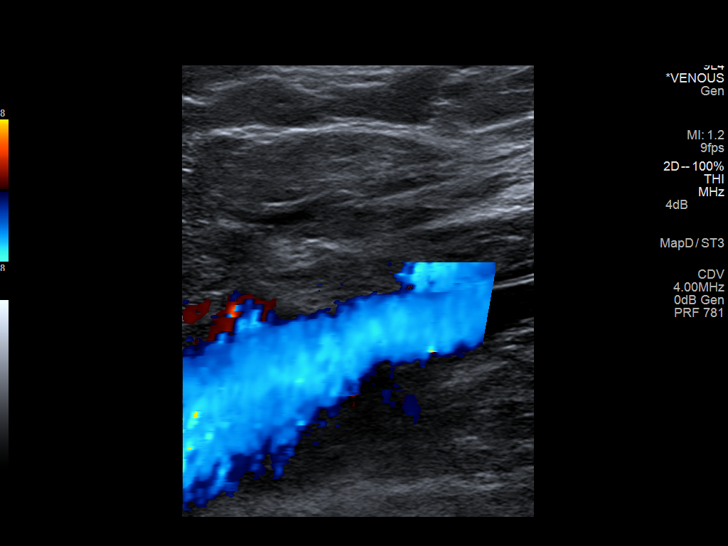
[im 4/42]
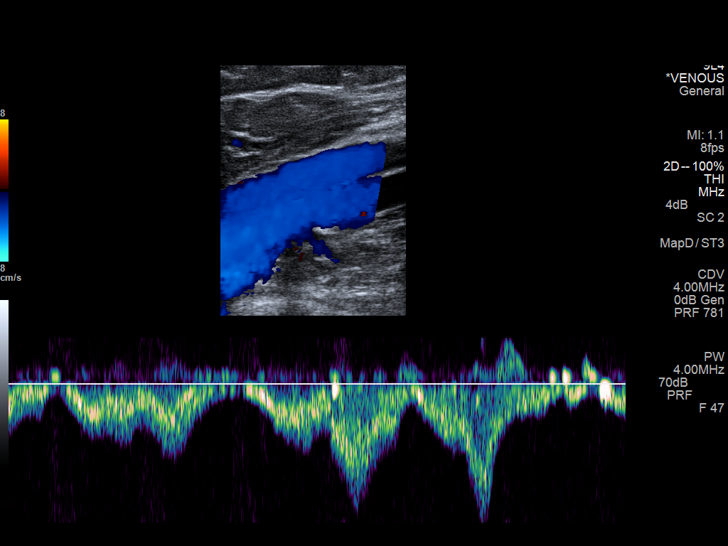
[im 8/42]
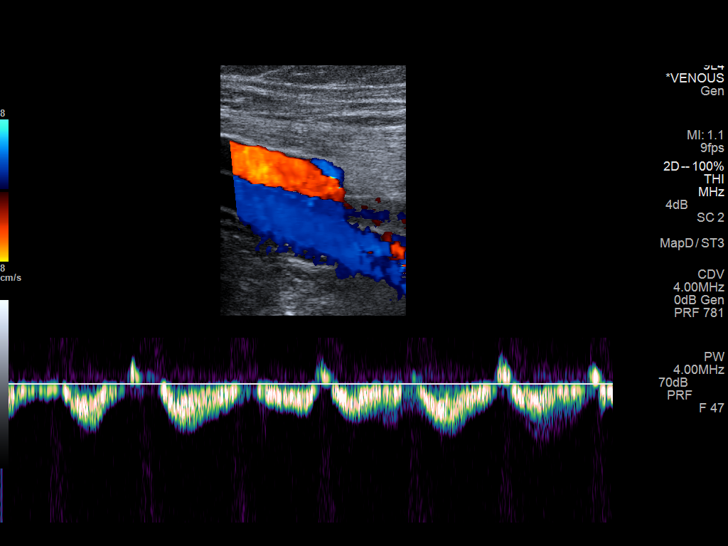
[im 11/42]
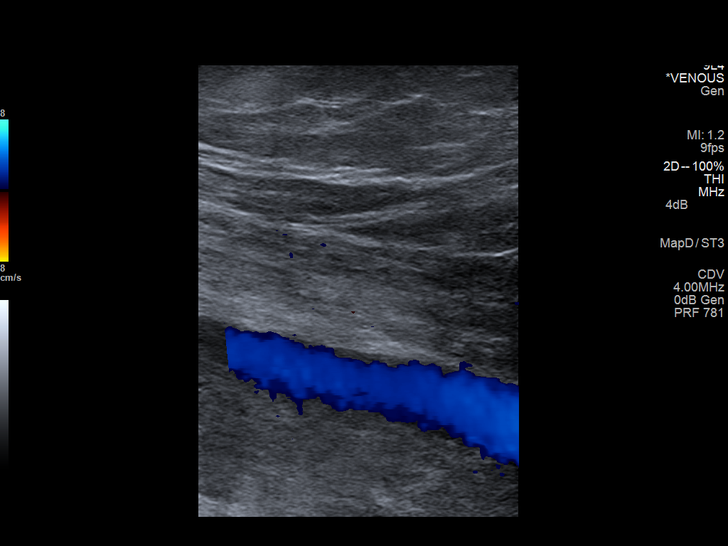
[im 15/42]
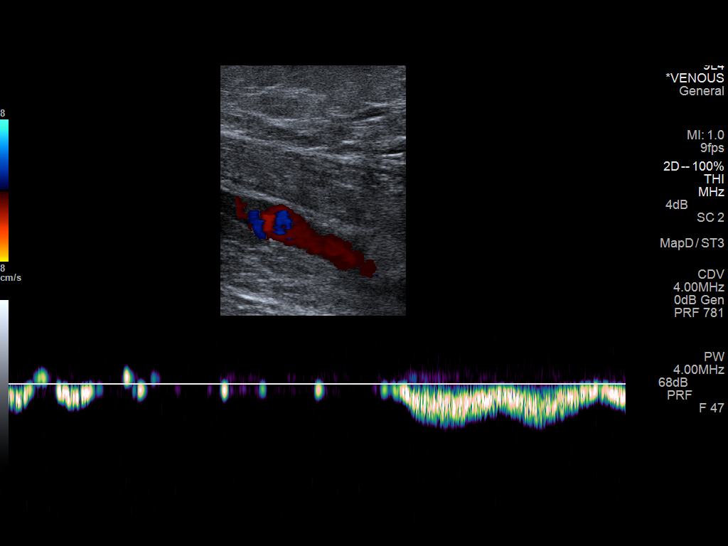
[im 18/42]
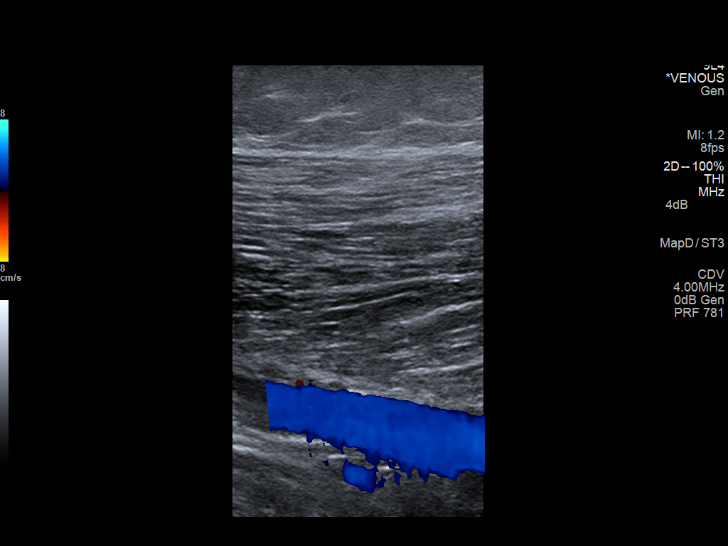
[im 22/42]
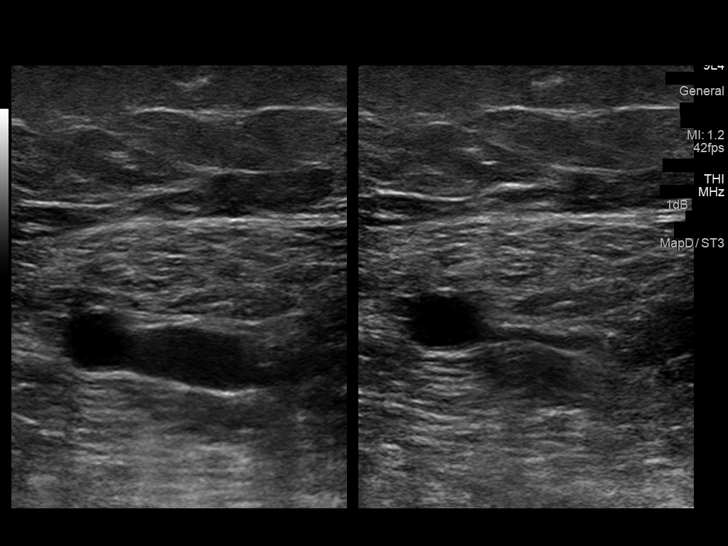
[im 24/42]
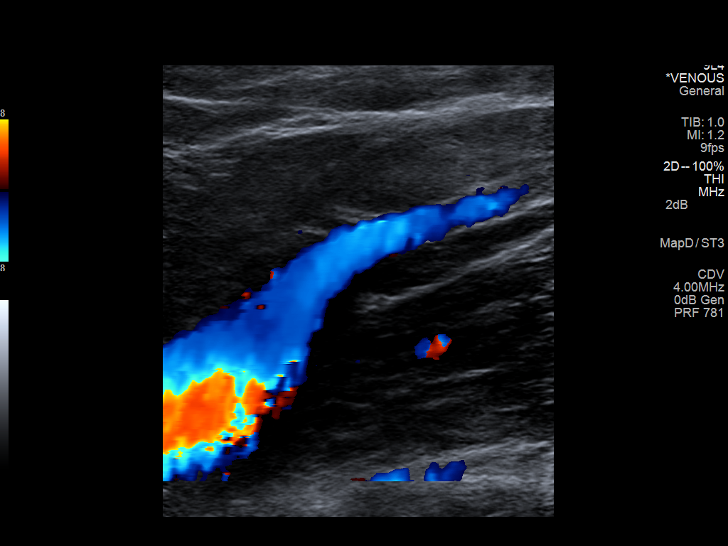
[im 27/42]
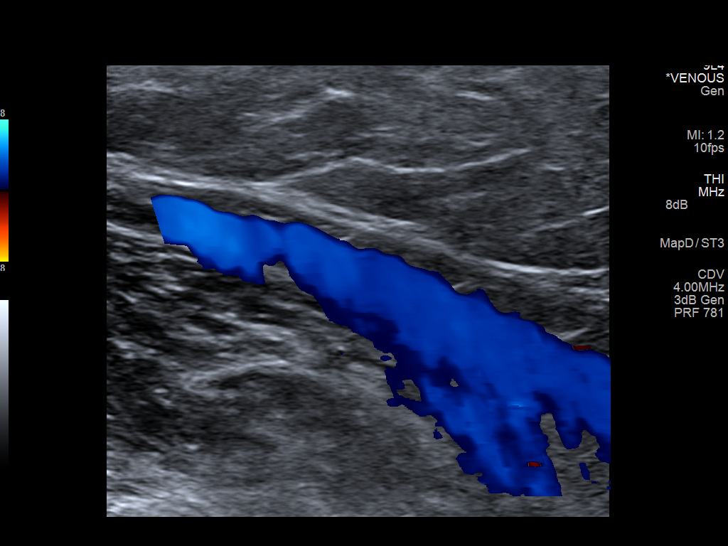
[im 31/42]
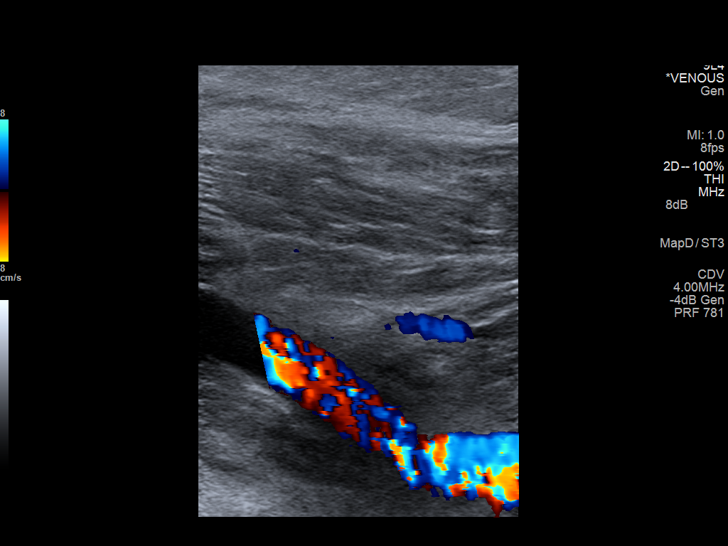
[im 34/42]
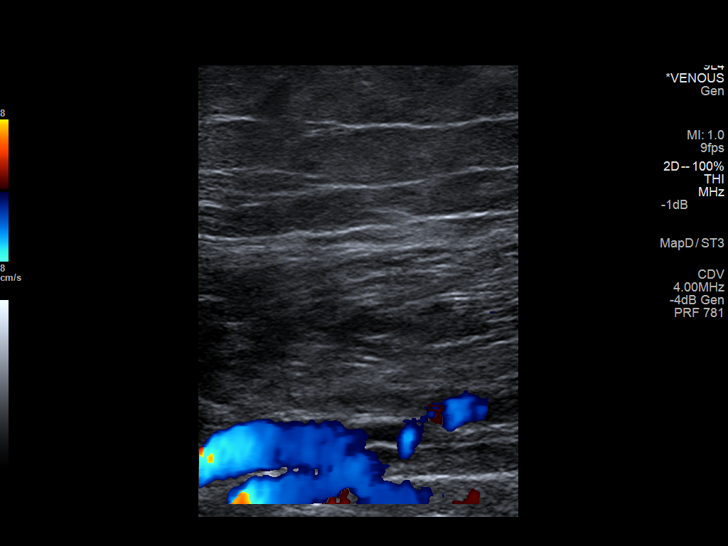
[im 38/42]
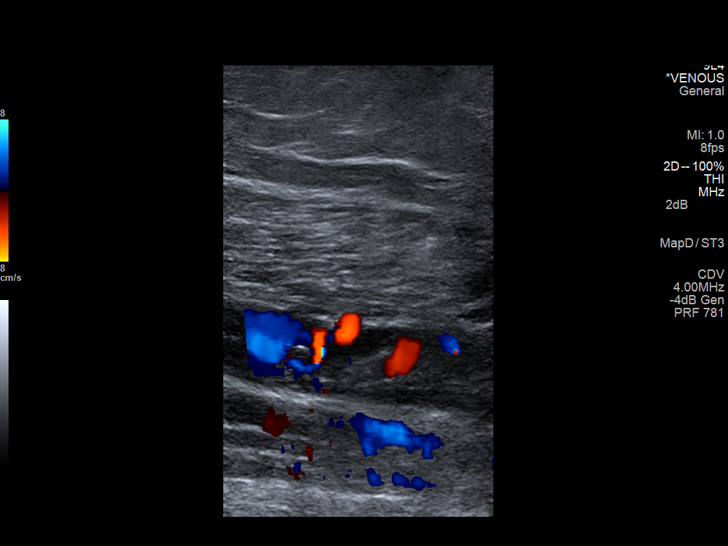
[im 42/42]
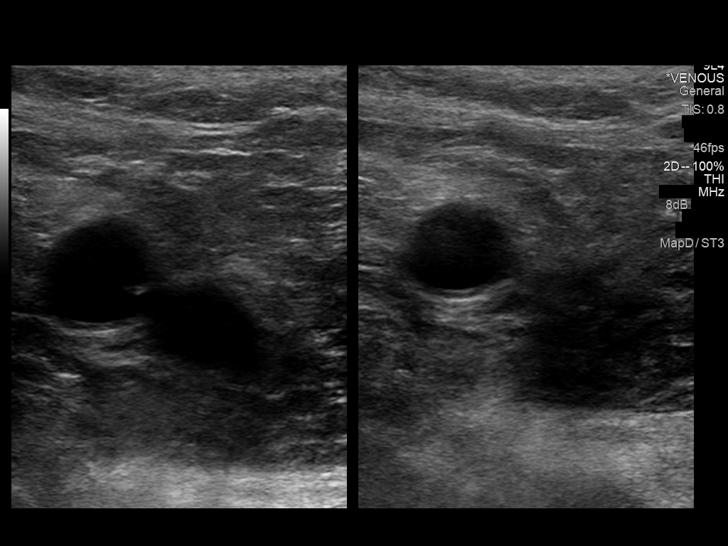

[13 of 24 positions shown; findings below may reference images not displayed]

FINDINGS: Contralateral Common Femoral Vein: Respiratory phasicity is normal
and symmetric with the symptomatic side. No evidence of thrombus.
Normal compressibility.

Common Femoral Vein: No evidence of thrombus. Normal
compressibility, respiratory phasicity and response to augmentation.

Saphenofemoral Junction: No evidence of thrombus. Normal
compressibility and flow on color Doppler imaging.

Profunda Femoral Vein: No evidence of thrombus. Normal
compressibility and flow on color Doppler imaging.

Femoral Vein: No evidence of thrombus. Normal compressibility,
respiratory phasicity and response to augmentation.

Popliteal Vein: No evidence of thrombus. Normal compressibility,
respiratory phasicity and response to augmentation.

Calf Veins: No evidence of thrombus. Normal compressibility and flow
on color Doppler imaging.

Superficial Great Saphenous Vein: No evidence of thrombus. Normal
compressibility and flow on color Doppler imaging.

Venous Reflux:  None.

Other Findings:  None.
IMPRESSION: No evidence of deep venous thrombosis.

## 2017-08-25 ENCOUNTER — Other Ambulatory Visit: Payer: Self-pay

## 2017-08-25 ENCOUNTER — Encounter: Payer: Self-pay | Admitting: *Deleted

## 2017-08-26 NOTE — Anesthesia Preprocedure Evaluation (Addendum)
Anesthesia Evaluation    Airway Mallampati: II  TM Distance: >3 FB Neck ROM: Full    Dental no notable dental hx.    Pulmonary neg pulmonary ROS,    Pulmonary exam normal breath sounds clear to auscultation       Cardiovascular hypertension, + CAD (s/p PCI)  Normal cardiovascular exam+ dysrhythmias (PVCs)  Rhythm:Regular Rate:Normal  Cardiomyopathy   Echo 04/28/17:  NORMAL LEFT VENTRICULAR SYSTOLIC FUNCTION NORMAL RIGHT VENTRICULAR SYSTOLIC FUNCTION MILD VALVULAR REGURGITATION NO VALVULAR STENOSIS Aortic sclerosis with mild AI  ECG 01/01/17:  Sinus rhythm 1st degree AV block Right bundle branch block Left anterior fascicular block Trifascicular block Nonspecific ST depression Abnormal ECG When compared with ECG of 09-Dec-2016 10:54, Sinus rhythm has replaced Sinus bradycardia   Pt is able to lay flat without symptoms.   Neuro/Psych    GI/Hepatic GERD  ,  Endo/Other    Renal/GU Renal disease (hx nephrolithiasis)     Musculoskeletal  (+) Arthritis ,   Abdominal   Peds  Hematology   Anesthesia Other Findings Cardiology note 06/09/17:  She had L knee arthroplasty without complication. Last visit in 11/2017 Dr. Alethia Berthold stopped plavix d/t completion of therapy. Losartan 50 mg added for HTN. Nuclear stress test prior to knee surgery without evidence of infarct or ischemia. Recently had echocardiogram done by PCP, with structurally normal heart.  Assessment and Plan:  Coronary artery disease involving native coronary artery of native heart without angina pectoris. She is now several years out from PCI without any ischemic symptoms. Recent stress test without evidence of infarct or ischemia. She has a normal ejection fraction and is euvolemic on exam. She had a severe bout of chest discomfort, but is likely due to GI symptoms rather than ischemia. If it occurs again, she may try to immediate acting GI medications. However,  since there may be hypertensive component, whether driven by anxiety or not, she may try sublingual nitroglycerin. For now, continue secondary prevention for CAD including aspirin, statin, losartan. No longer on beta-blocker due to bradycardia.  Essential hypertension. Blood pressure is not checked regularly at home, and she has elevated levels while here in clinic. Advised to check blood pressure outside of clinic for a few weeks. If systolic is consistently over 130s then will need to adjust her medications. Ideally systolic target would be 527- 130. If she is above goal, titrate losartan.  Disposition:  Return in about 3 months (around 09/09/2017) for Elnora Morrison PA.  Future Appointments  Date Time Provider [place]  07/18/2017 11:00 AM Sparks, Leonie Douglas, MD [id number] Lacretia Leigh    Reproductive/Obstetrics                            Anesthesia Physical Anesthesia Plan  ASA: III  Anesthesia Plan: MAC   Post-op Pain Management:    Induction: Intravenous  PONV Risk Score and Plan:   Airway Management Planned: Natural Airway  Additional Equipment:   Intra-op Plan:   Post-operative Plan: Extubation in OR  Informed Consent: I have reviewed the patients History and Physical, chart, labs and discussed the procedure including the risks, benefits and alternatives for the proposed anesthesia with the patient or authorized representative who has indicated his/her understanding and acceptance.   Dental advisory given  Plan Discussed with: CRNA  Anesthesia Plan Comments:         Anesthesia Quick Evaluation

## 2017-08-29 NOTE — Discharge Instructions (Signed)

## 2017-09-02 ENCOUNTER — Encounter
Admission: RE | Disposition: A | Discharge status: Home / Self Care | Payer: Self-pay | Source: Ambulatory Visit | Attending: Ophthalmology

## 2017-09-02 ENCOUNTER — Ambulatory Visit: Payer: Medicare Other | Admitting: Anesthesiology

## 2017-09-02 ENCOUNTER — Ambulatory Visit
Admission: RE | Admit: 2017-09-02 | Discharge: 2017-09-02 | Disposition: A | Discharge status: Home / Self Care | Payer: Medicare Other | Source: Ambulatory Visit | Attending: Ophthalmology | Admitting: Ophthalmology

## 2017-09-02 DIAGNOSIS — I358 Other nonrheumatic aortic valve disorders: Secondary | ICD-10-CM | POA: Diagnosis not present

## 2017-09-02 DIAGNOSIS — M1990 Unspecified osteoarthritis, unspecified site: Secondary | ICD-10-CM | POA: Insufficient documentation

## 2017-09-02 DIAGNOSIS — N182 Chronic kidney disease, stage 2 (mild): Secondary | ICD-10-CM | POA: Diagnosis not present

## 2017-09-02 DIAGNOSIS — I429 Cardiomyopathy, unspecified: Secondary | ICD-10-CM | POA: Diagnosis not present

## 2017-09-02 DIAGNOSIS — F329 Major depressive disorder, single episode, unspecified: Secondary | ICD-10-CM | POA: Diagnosis not present

## 2017-09-02 DIAGNOSIS — I440 Atrioventricular block, first degree: Secondary | ICD-10-CM | POA: Diagnosis not present

## 2017-09-02 DIAGNOSIS — I493 Ventricular premature depolarization: Secondary | ICD-10-CM | POA: Diagnosis not present

## 2017-09-02 DIAGNOSIS — I2510 Atherosclerotic heart disease of native coronary artery without angina pectoris: Secondary | ICD-10-CM | POA: Diagnosis not present

## 2017-09-02 DIAGNOSIS — I1310 Hypertensive heart and chronic kidney disease without heart failure, with stage 1 through stage 4 chronic kidney disease, or unspecified chronic kidney disease: Secondary | ICD-10-CM | POA: Diagnosis not present

## 2017-09-02 DIAGNOSIS — I453 Trifascicular block: Secondary | ICD-10-CM | POA: Diagnosis not present

## 2017-09-02 DIAGNOSIS — Z96652 Presence of left artificial knee joint: Secondary | ICD-10-CM | POA: Diagnosis not present

## 2017-09-02 DIAGNOSIS — Z955 Presence of coronary angioplasty implant and graft: Secondary | ICD-10-CM | POA: Insufficient documentation

## 2017-09-02 DIAGNOSIS — Z880 Allergy status to penicillin: Secondary | ICD-10-CM | POA: Insufficient documentation

## 2017-09-02 DIAGNOSIS — H2512 Age-related nuclear cataract, left eye: Secondary | ICD-10-CM | POA: Insufficient documentation

## 2017-09-02 DIAGNOSIS — Z87442 Personal history of urinary calculi: Secondary | ICD-10-CM | POA: Insufficient documentation

## 2017-09-02 DIAGNOSIS — Z882 Allergy status to sulfonamides status: Secondary | ICD-10-CM | POA: Diagnosis not present

## 2017-09-02 HISTORY — PX: CATARACT EXTRACTION W/PHACO: SHX586

## 2017-09-02 HISTORY — DX: Presence of dental prosthetic device (complete) (partial): Z97.2

## 2017-09-02 SURGERY — PHACOEMULSIFICATION, CATARACT, WITH IOL INSERTION
Anesthesia: Monitor Anesthesia Care | Site: Eye | Laterality: Left | Wound class: "Clean "

## 2017-09-02 MED ORDER — LACTATED RINGERS IV SOLN: INTRAVENOUS | Status: DC

## 2017-09-02 MED ORDER — ARMC OPHTHALMIC DILATING DROPS
1.0000 "application " | OPHTHALMIC | Status: DC | PRN
  Administered 2017-09-02 (×3): 1 via OPHTHALMIC

## 2017-09-02 MED ORDER — NA HYALUR & NA CHOND-NA HYALUR 0.4-0.35 ML IO KIT
PACK | INTRAOCULAR | Status: DC | PRN
  Administered 2017-09-02: 1 mL via INTRAOCULAR

## 2017-09-02 MED ORDER — BALANCED SALT IO SOLN
INTRAOCULAR | Status: DC | PRN
  Administered 2017-09-02: 1 mL

## 2017-09-02 MED ORDER — MIDAZOLAM HCL 2 MG/2ML IJ SOLN
INTRAMUSCULAR | Status: DC | PRN
  Administered 2017-09-02: 1 mg via INTRAVENOUS

## 2017-09-02 MED ORDER — BRIMONIDINE TARTRATE-TIMOLOL 0.2-0.5 % OP SOLN
OPHTHALMIC | Status: DC | PRN
  Administered 2017-09-02: 1 [drp] via OPHTHALMIC

## 2017-09-02 MED ORDER — MOXIFLOXACIN HCL 0.5 % OP SOLN
1.0000 [drp] | OPHTHALMIC | Status: DC | PRN
  Administered 2017-09-02 (×2): 1 [drp] via OPHTHALMIC

## 2017-09-02 MED ORDER — EPINEPHRINE PF 1 MG/ML IJ SOLN
INTRAOCULAR | Status: DC | PRN
  Administered 2017-09-02: 67 mL via OPHTHALMIC

## 2017-09-02 MED ORDER — FENTANYL CITRATE (PF) 100 MCG/2ML IJ SOLN
INTRAMUSCULAR | Status: DC | PRN
  Administered 2017-09-02: 50 ug via INTRAVENOUS

## 2017-09-02 MED ORDER — CEFUROXIME OPHTHALMIC INJECTION 1 MG/0.1 ML
INJECTION | OPHTHALMIC | Status: DC | PRN
  Administered 2017-09-02: 0.1 mL via OPHTHALMIC

## 2017-09-02 SURGICAL SUPPLY — 27 items
CANNULA ANT/CHMB 27G (MISCELLANEOUS) ×1 IMPLANT
CANNULA ANT/CHMB 27GA (MISCELLANEOUS) ×3 IMPLANT
CARTRIDGE ABBOTT (MISCELLANEOUS) IMPLANT
GLOVE SURG LX 7.5 STRW (GLOVE) ×2
GLOVE SURG LX STRL 7.5 STRW (GLOVE) ×1 IMPLANT
GLOVE SURG TRIUMPH 8.0 PF LTX (GLOVE) ×3 IMPLANT
GOWN STRL REUS W/ TWL LRG LVL3 (GOWN DISPOSABLE) ×2 IMPLANT
GOWN STRL REUS W/TWL LRG LVL3 (GOWN DISPOSABLE) ×4
LENS IOL TECNIS ITEC 23.5 (Intraocular Lens) ×2 IMPLANT
MARKER SKIN DUAL TIP RULER LAB (MISCELLANEOUS) ×3 IMPLANT
NDL FILTER BLUNT 18X1 1/2 (NEEDLE) ×1 IMPLANT
NDL RETROBULBAR .5 NSTRL (NEEDLE) IMPLANT
NEEDLE FILTER BLUNT 18X 1/2SAF (NEEDLE) ×2
NEEDLE FILTER BLUNT 18X1 1/2 (NEEDLE) ×1 IMPLANT
PACK CATARACT BRASINGTON (MISCELLANEOUS) ×3 IMPLANT
PACK EYE AFTER SURG (MISCELLANEOUS) ×3 IMPLANT
PACK OPTHALMIC (MISCELLANEOUS) ×3 IMPLANT
RING MALYGIN 7.0 (MISCELLANEOUS) IMPLANT
SUT ETHILON 10-0 CS-B-6CS-B-6 (SUTURE)
SUT VICRYL  9 0 (SUTURE)
SUT VICRYL 9 0 (SUTURE) IMPLANT
SUTURE EHLN 10-0 CS-B-6CS-B-6 (SUTURE) IMPLANT
SYR 3ML LL SCALE MARK (SYRINGE) ×3 IMPLANT
SYR 5ML LL (SYRINGE) ×3 IMPLANT
SYR TB 1ML LUER SLIP (SYRINGE) ×3 IMPLANT
WATER STERILE IRR 500ML POUR (IV SOLUTION) ×3 IMPLANT
WIPE NON LINTING 3.25X3.25 (MISCELLANEOUS) ×3 IMPLANT

## 2017-09-02 NOTE — Op Note (Signed)
OPERATIVE NOTE  Katelyn Crawford [id number] 09/02/2017   PREOPERATIVE DIAGNOSIS:  Nuclear sclerotic cataract left eye. H25.12   POSTOPERATIVE DIAGNOSIS:    Nuclear sclerotic cataract left eye.     PROCEDURE:  Phacoemusification with posterior chamber intraocular lens placement of the left eye   LENS:   Implant Name Type Inv. Item Serial No. Manufacturer Lot No. LRB No. Used  LENS IOL DIOP 23.5 - [id number] Intraocular Lens LENS IOL DIOP 23.5 [id number] AMO  Left 1        ULTRASOUND TIME: 13  % of 1 minutes 15 seconds, CDE 9.9  SURGEON:  Deirdre Evener, MD   ANESTHESIA:  Topical with tetracaine drops and 2% Xylocaine jelly, augmented with 1% preservative-free intracameral lidocaine.    COMPLICATIONS:  None.   DESCRIPTION OF PROCEDURE:  The patient was identified in the holding room and transported to the operating room and placed in the supine position under the operating microscope.  The left eye was identified as the operative eye and it was prepped and draped in the usual sterile ophthalmic fashion.   A 1 millimeter clear-corneal paracentesis was made at the 1:30 position.  0.5 ml of preservative-free 1% lidocaine was injected into the anterior chamber.  The anterior chamber was filled with Viscoat viscoelastic.  A 2.4 millimeter keratome was used to make a near-clear corneal incision at the 10:30 position.  .  A curvilinear capsulorrhexis was made with a cystotome and capsulorrhexis forceps.  Balanced salt solution was used to hydrodissect and hydrodelineate the nucleus.   Phacoemulsification was then used in stop and chop fashion to remove the lens nucleus and epinucleus.  The remaining cortex was then removed using the irrigation and aspiration handpiece. Provisc was then placed into the capsular bag to distend it for lens placement.  A lens was then injected into the capsular bag.  The remaining viscoelastic was aspirated.   Wounds were hydrated with balanced salt solution.   The anterior chamber was inflated to a physiologic pressure with balanced salt solution.  No wound leaks were noted. Cefuroxime 0.1 ml of a 10mg /ml solution was injected into the anterior chamber for a dose of 1 mg of intracameral antibiotic at the completion of the case.   Timolol and Brimonidine drops were applied to the eye.  The patient was taken to the recovery room in stable condition without complications of anesthesia or surgery.  [name] 09/02/2017, 10:03 AM

## 2017-09-02 NOTE — Transfer of Care (Signed)
Immediate Anesthesia Transfer of Care Note  Patient: Katelyn Crawford  Procedure(s) Performed: CATARACT EXTRACTION PHACO AND INTRAOCULAR LENS PLACEMENT (IOC)  LEFT (Left Eye)  Patient Location: PACU  Anesthesia Type: MAC  Level of Consciousness: awake, alert  and patient cooperative  Airway and Oxygen Therapy: Patient Spontanous Breathing and Patient connected to supplemental oxygen  Post-op Assessment: Post-op Vital signs reviewed, Patient's Cardiovascular Status Stable, Respiratory Function Stable, Patent Airway and No signs of Nausea or vomiting  Post-op Vital Signs: Reviewed and stable  Complications: No apparent anesthesia complications

## 2017-09-02 NOTE — Anesthesia Postprocedure Evaluation (Signed)
Anesthesia Post Note  Patient: Katelyn Crawford  Procedure(s) Performed: CATARACT EXTRACTION PHACO AND INTRAOCULAR LENS PLACEMENT (IOC)  LEFT (Left Eye)  Patient location during evaluation: PACU Anesthesia Type: MAC Level of consciousness: awake and alert Pain management: pain level controlled Vital Signs Assessment: post-procedure vital signs reviewed and stable Respiratory status: spontaneous breathing, nonlabored ventilation, respiratory function stable and patient connected to nasal cannula oxygen Cardiovascular status: stable and blood pressure returned to baseline Postop Assessment: no apparent nausea or vomiting Anesthetic complications: no    Dewel Lotter C

## 2017-09-02 NOTE — H&P (Signed)
The History and Physical notes are on paper, have been signed, and are to be scanned. The patient remains stable and unchanged from the H&P.   Previous H&P reviewed, patient examined, and there are no changes.  Katelyn Crawford 09/02/2017 9:13 AM   

## 2017-09-03 ENCOUNTER — Encounter: Payer: Self-pay | Admitting: Ophthalmology

## 2017-09-15 ENCOUNTER — Other Ambulatory Visit: Payer: Self-pay | Admitting: Internal Medicine

## 2017-09-15 DIAGNOSIS — Z1231 Encounter for screening mammogram for malignant neoplasm of breast: Secondary | ICD-10-CM

## 2017-09-24 ENCOUNTER — Other Ambulatory Visit: Payer: Self-pay

## 2017-09-24 ENCOUNTER — Encounter: Payer: Self-pay | Admitting: *Deleted

## 2017-09-25 NOTE — Discharge Instructions (Signed)

## 2017-10-01 ENCOUNTER — Ambulatory Visit: Payer: Medicare Other | Admitting: Anesthesiology

## 2017-10-01 ENCOUNTER — Ambulatory Visit
Admission: RE | Admit: 2017-10-01 | Discharge: 2017-10-01 | Disposition: A | Discharge status: Home / Self Care | Payer: Medicare Other | Source: Ambulatory Visit | Attending: Ophthalmology | Admitting: Ophthalmology

## 2017-10-01 ENCOUNTER — Encounter
Admission: RE | Disposition: A | Discharge status: Home / Self Care | Payer: Self-pay | Source: Ambulatory Visit | Attending: Ophthalmology

## 2017-10-01 DIAGNOSIS — Z79899 Other long term (current) drug therapy: Secondary | ICD-10-CM | POA: Diagnosis not present

## 2017-10-01 DIAGNOSIS — Z955 Presence of coronary angioplasty implant and graft: Secondary | ICD-10-CM | POA: Diagnosis not present

## 2017-10-01 DIAGNOSIS — I453 Trifascicular block: Secondary | ICD-10-CM | POA: Insufficient documentation

## 2017-10-01 DIAGNOSIS — I129 Hypertensive chronic kidney disease with stage 1 through stage 4 chronic kidney disease, or unspecified chronic kidney disease: Secondary | ICD-10-CM | POA: Diagnosis not present

## 2017-10-01 DIAGNOSIS — H2511 Age-related nuclear cataract, right eye: Secondary | ICD-10-CM | POA: Insufficient documentation

## 2017-10-01 DIAGNOSIS — Z96652 Presence of left artificial knee joint: Secondary | ICD-10-CM | POA: Diagnosis not present

## 2017-10-01 DIAGNOSIS — I429 Cardiomyopathy, unspecified: Secondary | ICD-10-CM | POA: Diagnosis not present

## 2017-10-01 DIAGNOSIS — I493 Ventricular premature depolarization: Secondary | ICD-10-CM | POA: Diagnosis not present

## 2017-10-01 DIAGNOSIS — I2510 Atherosclerotic heart disease of native coronary artery without angina pectoris: Secondary | ICD-10-CM | POA: Insufficient documentation

## 2017-10-01 DIAGNOSIS — M1990 Unspecified osteoarthritis, unspecified site: Secondary | ICD-10-CM | POA: Insufficient documentation

## 2017-10-01 DIAGNOSIS — I351 Nonrheumatic aortic (valve) insufficiency: Secondary | ICD-10-CM | POA: Insufficient documentation

## 2017-10-01 DIAGNOSIS — I452 Bifascicular block: Secondary | ICD-10-CM | POA: Insufficient documentation

## 2017-10-01 DIAGNOSIS — N182 Chronic kidney disease, stage 2 (mild): Secondary | ICD-10-CM | POA: Diagnosis not present

## 2017-10-01 DIAGNOSIS — I440 Atrioventricular block, first degree: Secondary | ICD-10-CM | POA: Insufficient documentation

## 2017-10-01 DIAGNOSIS — Z7982 Long term (current) use of aspirin: Secondary | ICD-10-CM | POA: Diagnosis not present

## 2017-10-01 DIAGNOSIS — K219 Gastro-esophageal reflux disease without esophagitis: Secondary | ICD-10-CM | POA: Insufficient documentation

## 2017-10-01 HISTORY — PX: CATARACT EXTRACTION W/PHACO: SHX586

## 2017-10-01 SURGERY — PHACOEMULSIFICATION, CATARACT, WITH IOL INSERTION
Anesthesia: Monitor Anesthesia Care | Site: Eye | Laterality: Right

## 2017-10-01 MED ORDER — LIDOCAINE HCL (PF) 2 % IJ SOLN
INTRAOCULAR | Status: DC | PRN
  Administered 2017-10-01: .5 mL

## 2017-10-01 MED ORDER — FENTANYL CITRATE (PF) 100 MCG/2ML IJ SOLN
INTRAMUSCULAR | Status: DC | PRN
  Administered 2017-10-01: 50 ug via INTRAVENOUS

## 2017-10-01 MED ORDER — EPINEPHRINE PF 1 MG/ML IJ SOLN
INTRAOCULAR | Status: DC | PRN
  Administered 2017-10-01: 59 mL via OPHTHALMIC

## 2017-10-01 MED ORDER — NA HYALUR & NA CHOND-NA HYALUR 0.4-0.35 ML IO KIT
PACK | INTRAOCULAR | Status: DC | PRN
  Administered 2017-10-01: 1 mL via INTRAOCULAR

## 2017-10-01 MED ORDER — ARMC OPHTHALMIC DILATING DROPS
1.0000 "application " | OPHTHALMIC | Status: DC | PRN
  Administered 2017-10-01 (×3): 1 via OPHTHALMIC

## 2017-10-01 MED ORDER — MOXIFLOXACIN HCL 0.5 % OP SOLN
1.0000 [drp] | OPHTHALMIC | Status: DC | PRN
  Administered 2017-10-01 (×3): 1 [drp] via OPHTHALMIC

## 2017-10-01 MED ORDER — CEFUROXIME OPHTHALMIC INJECTION 1 MG/0.1 ML
INJECTION | OPHTHALMIC | Status: DC | PRN
  Administered 2017-10-01: 0.1 mL via OPHTHALMIC

## 2017-10-01 MED ORDER — ONDANSETRON HCL 4 MG/2ML IJ SOLN: 4.0000 mg | Freq: Once | INTRAMUSCULAR | Status: DC | PRN

## 2017-10-01 MED ORDER — LACTATED RINGERS IV SOLN: 10.0000 mL/h | INTRAVENOUS | Status: DC

## 2017-10-01 MED ORDER — MIDAZOLAM HCL 2 MG/2ML IJ SOLN
INTRAMUSCULAR | Status: DC | PRN
  Administered 2017-10-01: 1 mg via INTRAVENOUS

## 2017-10-01 MED ORDER — BRIMONIDINE TARTRATE-TIMOLOL 0.2-0.5 % OP SOLN
OPHTHALMIC | Status: DC | PRN
  Administered 2017-10-01: 1 [drp] via OPHTHALMIC

## 2017-10-01 SURGICAL SUPPLY — 27 items

## 2017-10-01 NOTE — Transfer of Care (Signed)
Immediate Anesthesia Transfer of Care Note  Patient: Katelyn Crawford  Procedure(s) Performed: CATARACT EXTRACTION PHACO AND INTRAOCULAR LENS PLACEMENT (IOC) RIGHT (Right Eye)  Patient Location: PACU  Anesthesia Type: MAC  Level of Consciousness: awake, alert  and patient cooperative  Airway and Oxygen Therapy: Patient Spontanous Breathing and Patient connected to supplemental oxygen  Post-op Assessment: Post-op Vital signs reviewed, Patient's Cardiovascular Status Stable, Respiratory Function Stable, Patent Airway and No signs of Nausea or vomiting  Post-op Vital Signs: Reviewed and stable  Complications: No apparent anesthesia complications

## 2017-10-01 NOTE — Op Note (Signed)
LOCATION:  [facility]   PREOPERATIVE DIAGNOSIS:    Nuclear sclerotic cataract right eye. H25.11   POSTOPERATIVE DIAGNOSIS:  Nuclear sclerotic cataract right eye.     PROCEDURE:  Phacoemusification with posterior chamber intraocular lens placement of the right eye   LENS:   Implant Name Type Inv. Item Serial No. Manufacturer Lot No. LRB No. Used  LENS IOL DIOP 23.5 - [id number] Intraocular Lens LENS IOL DIOP 23.5 [id number]  Right 1        ULTRASOUND TIME: 20 % of 1 minutes, 10 seconds.  CDE 14.2   SURGEON:  Deirdre Evener, MD   ANESTHESIA:  Topical with tetracaine drops and 2% Xylocaine jelly, augmented with 1% preservative-free intracameral lidocaine.    COMPLICATIONS:  None.   DESCRIPTION OF PROCEDURE:  The patient was identified in the holding room and transported to the operating room and placed in the supine position under the operating microscope.  The right eye was identified as the operative eye and it was prepped and draped in the usual sterile ophthalmic fashion.   A 1 millimeter clear-corneal paracentesis was made at the 12:00 position.  0.5 ml of preservative-free 1% lidocaine was injected into the anterior chamber. The anterior chamber was filled with Viscoat viscoelastic.  A 2.4 millimeter keratome was used to make a near-clear corneal incision at the 9:00 position.  A curvilinear capsulorrhexis was made with a cystotome and capsulorrhexis forceps.  Balanced salt solution was used to hydrodissect and hydrodelineate the nucleus.   Phacoemulsification was then used in stop and chop fashion to remove the lens nucleus and epinucleus.  The remaining cortex was then removed using the irrigation and aspiration handpiece. Provisc was then placed into the capsular bag to distend it for lens placement.  A lens was then injected into the capsular bag.  The remaining viscoelastic was aspirated.   Wounds were hydrated with balanced salt solution.  The anterior  chamber was inflated to a physiologic pressure with balanced salt solution.  No wound leaks were noted. Cefuroxime 0.1 ml of a 10mg /ml solution was injected into the anterior chamber for a dose of 1 mg of intracameral antibiotic at the completion of the case.   Timolol and Brimonidine drops were applied to the eye.  The patient was taken to the recovery room in stable condition without complications of anesthesia or surgery.   Katelyn Crawford 10/01/2017, 12:29 PM

## 2017-10-01 NOTE — Anesthesia Postprocedure Evaluation (Signed)
Anesthesia Post Note  Patient: Katelyn Crawford  Procedure(s) Performed: CATARACT EXTRACTION PHACO AND INTRAOCULAR LENS PLACEMENT (IOC) RIGHT (Right Eye)  Patient location during evaluation: PACU Anesthesia Type: MAC Level of consciousness: awake and alert Pain management: pain level controlled Vital Signs Assessment: post-procedure vital signs reviewed and stable Respiratory status: spontaneous breathing, nonlabored ventilation, respiratory function stable and patient connected to nasal cannula oxygen Cardiovascular status: stable and blood pressure returned to baseline Postop Assessment: no apparent nausea or vomiting Anesthetic complications: no    Veda Canning

## 2017-10-01 NOTE — Anesthesia Preprocedure Evaluation (Signed)
Anesthesia Evaluation  Patient identified by MRN, date of birth, ID band Patient awake    Reviewed: Allergy & Precautions, NPO status   Airway Mallampati: II  TM Distance: >3 FB Neck ROM: Full    Dental   Pulmonary neg pulmonary ROS, neg recent URI,    breath sounds clear to auscultation       Cardiovascular hypertension, + CAD (s/p PCI)  + dysrhythmias (PVCs)  Rhythm:Regular Rate:Normal  Cardiomyopathy   Echo 04/28/17:  NORMAL LEFT VENTRICULAR SYSTOLIC FUNCTION NORMAL RIGHT VENTRICULAR SYSTOLIC FUNCTION MILD VALVULAR REGURGITATION NO VALVULAR STENOSIS Aortic sclerosis with mild AI  ECG 01/01/17:  Sinus rhythm 1st degree AV block 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   Neuro/Psych    GI/Hepatic GERD  ,  Endo/Other    Renal/GU Renal disease (hx nephrolithiasis)     Musculoskeletal  (+) Arthritis ,   Abdominal   Peds  Hematology   Anesthesia Other Findings   Reproductive/Obstetrics                             Anesthesia Physical  Anesthesia Plan  ASA: III  Anesthesia Plan: MAC   Post-op Pain Management:    Induction: Intravenous  PONV Risk Score and Plan:   Airway Management Planned: Natural Airway and Nasal Cannula  Additional Equipment:   Intra-op Plan:   Post-operative Plan:   Informed Consent: I have reviewed the patients History and Physical, chart, labs and discussed the procedure including the risks, benefits and alternatives for the proposed anesthesia with the patient or authorized representative who has indicated his/her understanding and acceptance.     Plan Discussed with: CRNA  Anesthesia Plan Comments:         Anesthesia Quick Evaluation

## 2017-10-01 NOTE — H&P (Signed)
The History and Physical notes are on paper, have been signed, and are to be scanned. The patient remains stable and unchanged from the H&P.   Previous H&P reviewed, patient examined, and there are no changes.  Katelyn Crawford 10/01/2017 11:18 AM

## 2017-10-01 NOTE — Anesthesia Procedure Notes (Signed)
Procedure Name: MAC Date/Time: 10/01/2017 12:10 PM Performed by: Janna Arch, CRNA Pre-anesthesia Checklist: Patient identified, Emergency Drugs available, Suction available, Timeout performed and Patient being monitored Patient Re-evaluated:Patient Re-evaluated prior to induction Oxygen Delivery Method: Nasal cannula Placement Confirmation: positive ETCO2

## 2017-10-02 ENCOUNTER — Encounter: Payer: Self-pay | Admitting: Ophthalmology

## 2017-10-08 ENCOUNTER — Other Ambulatory Visit: Payer: Self-pay | Admitting: Neurology

## 2017-10-08 DIAGNOSIS — G20 Parkinson's disease: Secondary | ICD-10-CM

## 2017-10-19 ENCOUNTER — Ambulatory Visit
Admission: RE | Admit: 2017-10-19 | Discharge: 2017-10-19 | Disposition: A | Discharge status: Home / Self Care | Payer: Medicare Other | Source: Ambulatory Visit | Attending: Neurology | Admitting: Neurology

## 2017-10-19 DIAGNOSIS — Z8673 Personal history of transient ischemic attack (TIA), and cerebral infarction without residual deficits: Secondary | ICD-10-CM | POA: Insufficient documentation

## 2017-10-19 DIAGNOSIS — G20 Parkinson's disease: Secondary | ICD-10-CM | POA: Insufficient documentation

## 2017-10-22 ENCOUNTER — Ambulatory Visit
Admission: RE | Admit: 2017-10-22 | Discharge: 2017-10-22 | Disposition: A | Discharge status: Home / Self Care | Payer: Medicare Other | Source: Ambulatory Visit | Attending: Internal Medicine | Admitting: Internal Medicine

## 2017-10-22 DIAGNOSIS — Z1231 Encounter for screening mammogram for malignant neoplasm of breast: Secondary | ICD-10-CM | POA: Diagnosis present

## 2018-09-17 ENCOUNTER — Other Ambulatory Visit: Payer: Self-pay | Admitting: Internal Medicine

## 2018-09-17 DIAGNOSIS — Z1231 Encounter for screening mammogram for malignant neoplasm of breast: Secondary | ICD-10-CM

## 2018-10-26 ENCOUNTER — Ambulatory Visit
Admission: RE | Admit: 2018-10-26 | Discharge: 2018-10-26 | Disposition: A | Discharge status: Home / Self Care | Payer: Medicare Other | Source: Ambulatory Visit | Attending: Internal Medicine | Admitting: Internal Medicine

## 2018-10-26 DIAGNOSIS — Z1231 Encounter for screening mammogram for malignant neoplasm of breast: Secondary | ICD-10-CM | POA: Diagnosis not present

## 2018-11-17 ENCOUNTER — Other Ambulatory Visit: Payer: Self-pay | Admitting: Neurology

## 2018-11-17 DIAGNOSIS — M545 Low back pain: Secondary | ICD-10-CM

## 2018-11-17 DIAGNOSIS — G8929 Other chronic pain: Secondary | ICD-10-CM

## 2018-11-21 ENCOUNTER — Other Ambulatory Visit: Payer: Self-pay

## 2018-11-21 ENCOUNTER — Ambulatory Visit
Admission: RE | Admit: 2018-11-21 | Discharge: 2018-11-21 | Disposition: A | Discharge status: Home / Self Care | Payer: Medicare Other | Source: Ambulatory Visit | Attending: Neurology | Admitting: Neurology

## 2018-11-21 DIAGNOSIS — M545 Low back pain, unspecified: Secondary | ICD-10-CM

## 2018-11-21 DIAGNOSIS — G8929 Other chronic pain: Secondary | ICD-10-CM | POA: Diagnosis present

## 2019-08-26 ENCOUNTER — Other Ambulatory Visit: Payer: Self-pay | Admitting: Internal Medicine

## 2019-08-26 DIAGNOSIS — Z1231 Encounter for screening mammogram for malignant neoplasm of breast: Secondary | ICD-10-CM

## 2019-10-28 ENCOUNTER — Ambulatory Visit
Admission: RE | Admit: 2019-10-28 | Discharge: 2019-10-28 | Disposition: A | Discharge status: Home / Self Care | Payer: Medicare Other | Source: Ambulatory Visit | Attending: Internal Medicine | Admitting: Internal Medicine

## 2019-10-28 DIAGNOSIS — Z1231 Encounter for screening mammogram for malignant neoplasm of breast: Secondary | ICD-10-CM | POA: Diagnosis present

## 2020-04-07 ENCOUNTER — Other Ambulatory Visit: Payer: Self-pay | Admitting: Neurology

## 2020-04-07 DIAGNOSIS — R1310 Dysphagia, unspecified: Secondary | ICD-10-CM

## 2020-04-12 ENCOUNTER — Other Ambulatory Visit: Payer: Self-pay

## 2020-04-12 ENCOUNTER — Ambulatory Visit
Admission: RE | Admit: 2020-04-12 | Discharge: 2020-04-12 | Disposition: A | Discharge status: Home / Self Care | Payer: Medicare Other | Source: Ambulatory Visit | Attending: Neurology | Admitting: Neurology

## 2020-04-12 DIAGNOSIS — R1310 Dysphagia, unspecified: Secondary | ICD-10-CM | POA: Insufficient documentation

## 2020-04-12 NOTE — Therapy (Signed)
[facility] [address] [place], [place], [address] Phone: [phone]   Fax:     Modified Barium Swallow  Patient Details  Name: Katelyn Crawford MRN: [id number] Date of Birth: Jun 23, 1940 No data recorded  Encounter Date: 04/12/2020   End of Session - 04/12/20 1408    Visit Number 1    Number of Visits 1    Date for SLP Re-Evaluation 04/12/20    SLP Start Time 1245    SLP Stop Time  1305    SLP Time Calculation (min) 20 min    Activity Tolerance Patient tolerated treatment well             There were no vitals filed for this visit.   Subjective Assessment - 04/12/20 1339    Subjective Pt reported geting "choked" with liquids about once a week    Currently in Pain? No/denies             Objective Swallowing Evaluation: Type of Study: MBS-Modified Barium Swallow Study   Patient Details  Name: NYEEMA WANT MRN: [id number] Date of Birth: 81-30-1942  Today's Date: 04/12/2020 Time: SLP Start Time (ACUTE ONLY): 1245 -SLP Stop Time (ACUTE ONLY): 1305  SLP Time Calculation (min) (ACUTE ONLY): 20 min   Past Medical History:  Past Medical History:  Diagnosis Date  . Arthritis    lower back  . Cardiomyopathy (HCC)    with diastolic dysfunction  . Chronic sinus bradycardia   . Coronary artery disease 06/24/2013   1. 03/10/12:Diffuse 90% stenosis mid right coronary artery treated medically because of inability to cannilate the vessel during cardiac catherization 03/10/2012  . Dizziness    chronic  . Environmental allergies    w/ allergic rhinitis  . GERD (gastroesophageal reflux disease) 06/25/2013  . H/O vertigo   . Heart disease   . History of degenerative disc disease   . History of diverticulosis   . History of kidney stones   . History of seasonal allergies   . Hyperlipidemia, unspecified   . Hypertension   . Nephrolithiasis   . Non-cardiac chest pain   . Osteoporosis, post-menopausal   . PVC's  (premature ventricular contractions)   . Vascular disease   . Wears dentures    full upper and lower   Past Surgical History:  Past Surgical History:  Procedure Laterality Date  . APPENDECTOMY    . BREAST EXCISIONAL BIOPSY Left 1984   neg  . BREAST EXCISIONAL BIOPSY Right 1997   lipoma  . CATARACT EXTRACTION W/PHACO Left 09/02/2017   Procedure: CATARACT EXTRACTION PHACO AND INTRAOCULAR LENS PLACEMENT (IOC)  LEFT;  Surgeon: Lockie Mola, MD;  Location: [facility];  Service: Ophthalmology;  Laterality: Left;  . CATARACT EXTRACTION W/PHACO Right 10/01/2017   Procedure: CATARACT EXTRACTION PHACO AND INTRAOCULAR LENS PLACEMENT (IOC) RIGHT;  Surgeon: Lockie Mola, MD;  Location: [facility];  Service: Ophthalmology;  Laterality: Right;  . COLONOSCOPY WITH PROPOFOL N/A 11/27/2016   Procedure: COLONOSCOPY WITH PROPOFOL;  Surgeon: Toledo, Boykin Nearing, MD;  Location: [facility];  Service: Gastroenterology;  Laterality: N/A;  . CORONARY ANGIOPLASTY WITH STENT PLACEMENT    . KNEE ARTHROPLASTY Left 01/22/2017   Procedure: COMPUTER ASSISTED TOTAL KNEE ARTHROPLASTY;  Surgeon: Donato Heinz, MD;  Location: [facility];  Service: Orthopedics;  Laterality: Left;  . KNEE ARTHROSCOPY Left 02/01/2015   Procedure: ARTHROSCOPY KNEE, partial medial & lateral menisectomy,,medial and patelofemoral chondroplasty;  Surgeon: Donato Heinz, MD;  Location:  [facility];  Service: Orthopedics;  Laterality: Left;  . LITHOTRIPSY Right 1995  . RIGHT AXILLARY LIPOMA REMOVAL    . TUBAL LIGATION    . VARICOSE VEIN SURGERY     HPI: Patient is an 80 y.o. female with past medical history noted for Parkinson's disease, GERD, HTN, CAD, osteoarthritis referred by Dr. Sherryll Burger for MBS. Pt reports coughing/choking with liquids, with a frequency of about once per week. Has difficulties with larger pills sometimes. She has not had pneumonia. Per pt, dysphagia occurs most often as he sinemet dose wears off.    Subjective: alert, cooperative    Assessment / Plan / Recommendation  CHL IP CLINICAL IMPRESSIONS 04/12/2020  Clinical Impression Patient presents with mild oral dysphagia with adequate airway protection. Oral phase is characterized by mildly prolonged but functional mastication, with some premature spillage to the valleculae (solid only) before the swallow. Pt had difficulty coordinating pill/liquid, requiring second swallow of liquid for anterior to posterior transfer of pill. Pharyngeal swallow initiation is generally timely at the level of the base of tongue, but occasionally delayed to valleculae or pyriform sinuses, without penetration or aspiration observed. Hyolaryngeal excursion, base of tongue retraction, and pharyngeal constriction appear WFL. Epiglottic deflection is complete. There is no abnormal residue remaining in the pharynx post-swallow. View of the cervical esophageal phase was somewhat limited due to shadow of pt's shoulder, but appears to be within functional limits. Esophageal sweep was performed with some to and fro movement of contrast noted in the distal esophagus. Recommend regular diet and thin liquids; pt would benefit from moistening dry foods with sauce or gravy, alternating solids and liquids, can attempt pills whole with puree to aid oral transit. Encouraged to use general aspiration precautions such as slow rate, small bites and sips, reflux precautions given history of GERD. Educated on swallowing changes common with Parkinson's disease and aspiration pneumonia. Encouraged her to follow up with her MD if she experiences decline in swallow function, weight loss, or pneumonia. At this time no further skilled ST recommended for dysphagia. Patient does report family asking her to repeat herself and raspy voice; consider ST referral for dysarthria if pt interested in pursuing this.  SLP Visit Diagnosis Dysphagia, pharyngeal phase (R13.13)  Attention and concentration deficit  following --  Frontal lobe and executive function deficit following --  Impact on safety and function Mild aspiration risk      CHL IP TREATMENT RECOMMENDATION 04/12/2020  Treatment Recommendations No treatment recommended at this time     Prognosis 04/12/2020  Prognosis for Safe Diet Advancement Good  Barriers to Reach Goals --  Barriers/Prognosis Comment --    CHL IP DIET RECOMMENDATION 04/12/2020  SLP Diet Recommendations Regular solids;Thin liquid  Liquid Administration via Cup  Medication Administration Whole meds with puree  Compensations Slow rate;Small sips/bites;Follow solids with liquid  Postural Changes Seated upright at 90 degrees;Remain semi-upright after after feeds/meals (Comment)      CHL IP OTHER RECOMMENDATIONS 04/12/2020  Recommended Consults --  Oral Care Recommendations Oral care BID  Other Recommendations --      CHL IP FOLLOW UP RECOMMENDATIONS 04/12/2020  Follow up Recommendations None      CHL IP FREQUENCY AND DURATION 04/12/2020  Speech Therapy Frequency (ACUTE ONLY) --  Treatment Duration Other (Comment)           CHL IP ORAL PHASE 04/12/2020  Oral Phase Impaired  Oral - Pudding Teaspoon --  Oral - Pudding Cup --  Oral - Honey Teaspoon --  Oral - Honey Cup --  Oral - Nectar Teaspoon --  Oral - Nectar Cup WFL  Oral - Nectar Straw --  Oral - Thin Teaspoon --  Oral - Thin Cup WFL  Oral - Thin Straw --  Oral - Puree WFL  Oral - Mech Soft Other (Comment);Premature spillage  Oral - Regular --  Oral - Multi-Consistency --  Oral - Pill Delayed oral transit;Decreased bolus cohesion;Lingual/palatal residue  Oral Phase - Comment --    CHL IP PHARYNGEAL PHASE 04/12/2020  Pharyngeal Phase WFL  Pharyngeal- Pudding Teaspoon --  Pharyngeal --  Pharyngeal- Pudding Cup --  Pharyngeal --  Pharyngeal- Honey Teaspoon --  Pharyngeal --  Pharyngeal- Honey Cup --  Pharyngeal --  Pharyngeal- Nectar Teaspoon --  Pharyngeal --  Pharyngeal- Nectar Cup [facility]   Pharyngeal Material does not enter airway  Pharyngeal- Nectar Straw --  Pharyngeal --  Pharyngeal- Thin Teaspoon --  Pharyngeal --  Pharyngeal- Thin Cup WFL;Delayed swallow initiation-pyriform sinuses  Pharyngeal Material does not enter airway  Pharyngeal- Thin Straw --  Pharyngeal --  Pharyngeal- Puree WFL  Pharyngeal Material does not enter airway  Pharyngeal- Mechanical Soft WFL  Pharyngeal Material does not enter airway  Pharyngeal- Regular --  Pharyngeal --  Pharyngeal- Multi-consistency --  Pharyngeal --  Pharyngeal- Pill WFL  Pharyngeal Material does not enter airway  Pharyngeal Comment --     CHL IP CERVICAL ESOPHAGEAL PHASE 04/12/2020  Cervical Esophageal Phase WFL  Pudding Teaspoon --  Pudding Cup --  Honey Teaspoon --  Honey Cup --  Nectar Teaspoon --  Nectar Cup --  Nectar Straw --  Thin Teaspoon --  Thin Cup --  Thin Straw --  Puree --  Mechanical Soft --  Regular --  Multi-consistency --  Pill --  Cervical Esophageal Comment --     Arlana Lindau 04/12/2020, 2:09 PM       Rondel Baton, MS, CCC-SLP Speech-Language Pathologist                   Dysphagia, unspecified type - Plan: DG SWALLOW FUNC OP MEDICARE SPEECH PATH, DG SWALLOW FUNC OP MEDICARE SPEECH PATH        Problem List Patient Active Problem List   Diagnosis Date Noted  . Primary osteoarthritis of hip 02/09/2017  . Primary osteoarthritis of knee 02/09/2017  . Chronic sinus bradycardia 01/22/2017  . Degenerative disc disease, lumbar 01/22/2017  . Dizziness 01/22/2017  . Nephrolithiasis 01/22/2017  . Osteoporosis, 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/22/2017  . PVC's (premature ventricular contractions) 01/22/2017  . S/P total knee arthroplasty 01/22/2017  . 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 CKD (chronic kidney disease), 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  . GERD (gastroesophageal reflux disease) 06/25/2013  . Coronary artery disease involving native coronary artery of native  heart 06/24/2013  . Essential hypertension 06/21/2013  . Mixed hyperlipidemia 06/21/2013    Arlana Lindau 04/12/2020, 2:09 PM  [facility] [address] [place], [place], [address] Phone: [phone]   Fax:     Name: JESUS POPLIN MRN: [id number] Date of Birth: 1940/09/15

## 2020-07-07 ENCOUNTER — Other Ambulatory Visit: Payer: Self-pay

## 2020-07-07 ENCOUNTER — Encounter: Payer: Self-pay | Admitting: Emergency Medicine

## 2020-07-07 ENCOUNTER — Emergency Department
Admission: EM | Admit: 2020-07-07 | Discharge: 2020-07-07 | Disposition: A | Discharge status: Home / Self Care | Payer: Medicare Other | Attending: Emergency Medicine | Admitting: Emergency Medicine

## 2020-07-07 ENCOUNTER — Emergency Department: Payer: Medicare Other

## 2020-07-07 DIAGNOSIS — I2510 Atherosclerotic heart disease of native coronary artery without angina pectoris: Secondary | ICD-10-CM | POA: Insufficient documentation

## 2020-07-07 DIAGNOSIS — Z7982 Long term (current) use of aspirin: Secondary | ICD-10-CM | POA: Diagnosis not present

## 2020-07-07 DIAGNOSIS — Z96652 Presence of left artificial knee joint: Secondary | ICD-10-CM | POA: Insufficient documentation

## 2020-07-07 DIAGNOSIS — G20 Parkinson's disease: Secondary | ICD-10-CM | POA: Insufficient documentation

## 2020-07-07 DIAGNOSIS — Z79899 Other long term (current) drug therapy: Secondary | ICD-10-CM | POA: Diagnosis not present

## 2020-07-07 DIAGNOSIS — I129 Hypertensive chronic kidney disease with stage 1 through stage 4 chronic kidney disease, or unspecified chronic kidney disease: Secondary | ICD-10-CM | POA: Insufficient documentation

## 2020-07-07 DIAGNOSIS — N182 Chronic kidney disease, stage 2 (mild): Secondary | ICD-10-CM | POA: Diagnosis not present

## 2020-07-07 DIAGNOSIS — Z955 Presence of coronary angioplasty implant and graft: Secondary | ICD-10-CM | POA: Diagnosis not present

## 2020-07-07 DIAGNOSIS — R42 Dizziness and giddiness: Secondary | ICD-10-CM

## 2020-07-07 HISTORY — DX: Parkinson's disease without dyskinesia, without mention of fluctuations: G20.A1

## 2020-07-07 LAB — COMPREHENSIVE METABOLIC PANEL
ALT: 11 U/L (ref 0–44)
AST: 26 U/L (ref 15–41)
Albumin: 4.3 g/dL (ref 3.5–5.0)
Alkaline Phosphatase: 76 U/L (ref 38–126)
Anion gap: 7 (ref 5–15)
BUN: 24 mg/dL — ABNORMAL HIGH (ref 8–23)
CO2: 27 mmol/L (ref 22–32)
Calcium: 9.5 mg/dL (ref 8.9–10.3)
Chloride: 93 mmol/L — ABNORMAL LOW (ref 98–111)
Creatinine, Ser: 0.93 mg/dL (ref 0.44–1.00)
GFR, Estimated: >60 mL/min (ref >60)
Glucose, Bld: 108 mg/dL — ABNORMAL HIGH (ref 70–99)
Potassium: 4.5 mmol/L (ref 3.5–5.1)
Sodium: 127 mmol/L — ABNORMAL LOW (ref 135–145)
Total Bilirubin: 0.8 mg/dL (ref 0.3–1.2)
Total Protein: 7.2 g/dL (ref 6.5–8.1)

## 2020-07-07 LAB — CBC WITH DIFFERENTIAL/PLATELET
Abs Immature Granulocytes: 0.09 10*3/uL — ABNORMAL HIGH (ref 0.00–0.07)
Basophils Absolute: 0.1 10*3/uL (ref 0.0–0.1)
Basophils Relative: 1 %
Eosinophils Absolute: 0.2 10*3/uL (ref 0.0–0.5)
Eosinophils Relative: 2 %
HCT: 39.6 % (ref 36.0–46.0)
Hemoglobin: 13.6 g/dL (ref 12.0–15.0)
Immature Granulocytes: 1 %
Lymphocytes Relative: 24 %
Lymphs Abs: 2.6 10*3/uL (ref 0.7–4.0)
MCH: 30.3 pg (ref 26.0–34.0)
MCHC: 34.3 g/dL (ref 30.0–36.0)
MCV: 88.2 fL (ref 80.0–100.0)
Monocytes Absolute: 1.1 10*3/uL — ABNORMAL HIGH (ref 0.1–1.0)
Monocytes Relative: 10 %
Neutro Abs: 6.9 10*3/uL (ref 1.7–7.7)
Neutrophils Relative %: 62 %
Platelets: 221 10*3/uL (ref 150–400)
RBC: 4.49 MIL/uL (ref 3.87–5.11)
RDW: 14 % (ref 11.5–15.5)
WBC: 10.9 10*3/uL — ABNORMAL HIGH (ref 4.0–10.5)
nRBC: 0 % (ref 0.0–0.2)

## 2020-07-07 LAB — URINALYSIS, COMPLETE (UACMP) WITH MICROSCOPIC
Bacteria, UA: NONE SEEN
Bilirubin Urine: NEGATIVE
Glucose, UA: NEGATIVE mg/dL
Hgb urine dipstick: NEGATIVE
Ketones, ur: 5 mg/dL — AB
Nitrite: NEGATIVE
Protein, ur: NEGATIVE mg/dL
Specific Gravity, Urine: 1.013 (ref 1.005–1.030)
pH: 6 (ref 5.0–8.0)

## 2020-07-07 LAB — TROPONIN I (HIGH SENSITIVITY): Troponin I (High Sensitivity): 6 ng/L (ref <18)

## 2020-07-07 MED ORDER — LACTATED RINGERS IV BOLUS
1000.0000 mL | Freq: Once | INTRAVENOUS | Status: AC
  Administered 2020-07-07: 1000 mL via INTRAVENOUS

## 2020-07-07 NOTE — ED Triage Notes (Signed)
C/o feeling dizzy when standing x 2 days.  Sent to ED from [facility] for patient having orthostatic VS.  Patient is AAOx3.  Skin warm and dry. NAD

## 2020-07-07 NOTE — ED Notes (Signed)
Lab called and informed of BNP add-on.  Lab verbalized understanding.

## 2020-07-07 NOTE — ED Provider Notes (Signed)
[facility] Emergency Department Provider Note  ____________________________________________   Event Date/Time   First MD Initiated Contact with Patient 07/07/20 1100     (approximate)  I have reviewed the triage vital signs and the nursing notes.   HISTORY  Chief Complaint Dizziness   HPI Katelyn Crawford is a 80 y.o. female in physical history of arthritis, CHF, CAD, sinus pericardia, GERD, HTN, HDL, kidney stones, and peripheral vascular disease who presents for assessment of some lightheadedness she is seeing whenever she stands quickly over the last 2 days.  He denies any headache, vertigo, vision changes, chest pain, cough, shortness of breath, Donnell pain, back pain, rash, urinary symptoms, focal extremity weakness numbness or tingling or recent falls or injuries.  States she does not feel dizzy or lightheaded she is staying still only when she gets up quickly.  She states she is eating and drinking adequately and has not had any changes to her medications.  She is only on losartan for blood pressure.  She does note that she recently finished a short course of prednisone which she has been taking for a "pinched nerve" in her right leg.  She states that this pain has not been bothering her that much..  She Denies Any Other Acute Concerns at This Time         Past Medical History:  Diagnosis Date   Arthritis    lower back   Cardiomyopathy (HCC)    with diastolic dysfunction   Chronic sinus bradycardia    Coronary artery disease 06/24/2013   1. 03/10/12:Diffuse 90% stenosis mid right coronary artery treated medically because of inability to cannilate the vessel during cardiac catherization 03/10/2012   Dizziness    chronic   Environmental allergies    w/ allergic rhinitis   GERD (gastroesophageal reflux disease) 06/25/2013   H/O vertigo    Heart disease    History of degenerative disc disease    History of diverticulosis    History of kidney stones     History of seasonal allergies    Hyperlipidemia, unspecified    Hypertension    Nephrolithiasis    Non-cardiac chest pain    Osteoporosis, post-menopausal    Parkinson disease (HCC)    PVC's (premature ventricular contractions)    Vascular disease    Wears dentures    full upper and lower    Patient Active Problem List   Diagnosis Date Noted   Primary osteoarthritis of hip 02/09/2017   Primary osteoarthritis of knee 02/09/2017   Chronic sinus bradycardia 01/22/2017   Degenerative disc disease, lumbar 01/22/2017   Dizziness 01/22/2017   Nephrolithiasis 01/22/2017   Osteoporosis, 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/22/2017   PVC's (premature ventricular contractions) 01/22/2017   S/P total knee arthroplasty 01/22/2017   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   CKD (chronic kidney disease), 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   GERD (gastroesophageal reflux disease) 06/25/2013   Coronary artery disease involving native coronary artery of native heart 06/24/2013   Essential hypertension 06/21/2013   Mixed hyperlipidemia 06/21/2013    Past Surgical History:  Procedure Laterality Date   APPENDECTOMY     BREAST EXCISIONAL BIOPSY Left 1984   neg   BREAST EXCISIONAL BIOPSY Right 1997   lipoma   CATARACT EXTRACTION W/PHACO Left 09/02/2017   Procedure: CATARACT EXTRACTION PHACO AND INTRAOCULAR LENS PLACEMENT (IOC)  LEFT;  Surgeon: Lockie Mola, MD;  Location: [facility];  Service: Ophthalmology;  Laterality: Left;   CATARACT EXTRACTION W/PHACO Right 10/01/2017   Procedure:  CATARACT EXTRACTION PHACO AND INTRAOCULAR LENS PLACEMENT (IOC) RIGHT;  Surgeon: Lockie Mola, MD;  Location: [facility];  Service: Ophthalmology;  Laterality: Right;   COLONOSCOPY WITH PROPOFOL N/A 11/27/2016   Procedure: COLONOSCOPY WITH PROPOFOL;  Surgeon: Toledo, Boykin Nearing, MD;  Location: [facility];  Service: Gastroenterology;  Laterality: N/A;   CORONARY ANGIOPLASTY WITH STENT PLACEMENT     KNEE  ARTHROPLASTY Left 01/22/2017   Procedure: COMPUTER ASSISTED TOTAL KNEE ARTHROPLASTY;  Surgeon: Donato Heinz, MD;  Location: [facility];  Service: Orthopedics;  Laterality: Left;   KNEE ARTHROSCOPY Left 02/01/2015   Procedure: ARTHROSCOPY KNEE, partial medial & lateral menisectomy,,medial and patelofemoral chondroplasty;  Surgeon: Donato Heinz, MD;  Location: [facility];  Service: Orthopedics;  Laterality: Left;   LITHOTRIPSY Right 1995   RIGHT AXILLARY LIPOMA REMOVAL     TUBAL LIGATION     VARICOSE VEIN SURGERY      Prior to Admission medications   Medication Sig Start Date End Date Taking? Authorizing Provider  aspirin EC 81 MG tablet Take 81 mg by mouth daily.    [provider]  B Complex-C (SUPER B COMPLEX PO) Take 1 capsule by mouth daily.    [provider]  cetirizine (ZYRTEC) 10 MG tablet Take 10 mg by mouth at bedtime.     [provider]  Cholecalciferol (VITAMIN D-3) 1000 units CAPS Take 1,000 Int'l Units by mouth daily.    [provider]  COENZYME Q10-OMEGA 3 FATTY ACD PO Take 2 tablets by mouth daily.    [provider]  fluticasone (FLONASE) 50 MCG/ACT nasal spray Place 2 sprays into both nostrils daily. 12/15/16   Domenick Gong, MD  losartan (COZAAR) 50 MG tablet Take 50 mg by mouth daily.     [provider]  Multiple Minerals (CALCIUM-MAGNESIUM-ZINC) TABS Take 1 tablet by mouth daily.    [provider]  Multiple Vitamins-Minerals (MULTIVITAMIN ADULT) TABS Take 1 tablet by mouth daily.     [provider]  nitroGLYCERIN (NITROSTAT) 0.4 MG SL tablet Place 0.4 mg under the tongue every 5 (five) minutes as needed for chest pain.    [provider]  omeprazole (PRILOSEC) 20 MG capsule Take 20 mg by mouth daily as needed.     [provider]  rosuvastatin (CRESTOR) 20 MG tablet Take 20 mg by mouth daily.    [provider]  sertraline (ZOLOFT) 50 MG tablet Take 25 mg by mouth at  bedtime.     [provider]  vitamin C (ASCORBIC ACID) 500 MG tablet Take 500 mg by mouth daily.    [provider]    Allergies Penicillins and Sulfa antibiotics  Family History  Problem Relation Age of Onset   Hypertension Mother    Congestive Heart Failure Father    Coronary artery disease Father    Coronary artery disease Son        STEMI w/ PCI   Breast cancer Neg Hx    Colon cancer Neg Hx    Colon polyps Neg Hx     Social History Social History   Tobacco Use   Smoking status: Never   Smokeless tobacco: Never  Vaping Use   Vaping Use: Never used  Substance Use Topics   Alcohol use: No   Drug use: No    Review of Systems  Review of Systems  Constitutional:  Negative for chills and fever.  HENT:  Negative for sore throat.   Eyes:  Negative for  pain.  Respiratory:  Negative for cough and stridor.   Cardiovascular:  Negative for chest pain.  Gastrointestinal:  Negative for vomiting.  Genitourinary:  Negative for dysuria.  Musculoskeletal:  Negative for myalgias.  Skin:  Negative for rash.  Neurological:  Positive for dizziness. Negative for seizures, loss of consciousness and headaches.  Psychiatric/Behavioral:  Negative for suicidal ideas.   All other systems reviewed and are negative.    ____________________________________________   PHYSICAL EXAM:  VITAL SIGNS: ED Triage Vitals  Enc Vitals Group     BP      Pulse      Resp      Temp      Temp src      SpO2      Weight      Height      Head Circumference      Peak Flow      Pain Score      Pain Loc      Pain Edu?      Excl. in GC?    Vitals:   07/07/20 1200 07/07/20 1230  BP: (!) 167/79 (!) 170/80  Pulse: 66 64  Resp: 20 (!) 24  Temp:    SpO2: 97% 99%   Physical Exam Vitals and nursing note reviewed.  Constitutional:      General: She is not in acute distress.    Appearance: She is well-developed.  HENT:     Head: Normocephalic and atraumatic.     Right Ear:  External ear normal.     Left Ear: External ear normal.     Nose: Nose normal.  Eyes:     Conjunctiva/sclera: Conjunctivae normal.  Cardiovascular:     Rate and Rhythm: Normal rate and regular rhythm.     Heart sounds: No murmur heard. Pulmonary:     Effort: Pulmonary effort is normal. No respiratory distress.     Breath sounds: Normal breath sounds.  Abdominal:     Palpations: Abdomen is soft.     Tenderness: There is no abdominal tenderness.  Musculoskeletal:     Cervical back: Neck supple.  Skin:    General: Skin is warm and dry.     Capillary Refill: Capillary refill takes less than 2 seconds.  Neurological:     Mental Status: She is alert and oriented to person, place, and time.    Renal nerves II through grossly intact.  Patient has symmetric strength in upper and lower extremities.  She has sensation intact light touch of all extremities. ____________________________________________   LABS (all labs ordered are listed, but only abnormal results are displayed)  Labs Reviewed  CBC WITH DIFFERENTIAL/PLATELET - Abnormal; Notable for the following components:      Result Value   WBC 10.9 (*)    Monocytes Absolute 1.1 (*)    Abs Immature Granulocytes 0.09 (*)    All other components within normal limits  COMPREHENSIVE METABOLIC PANEL - Abnormal; Notable for the following components:   Sodium 127 (*)    Chloride 93 (*)    Glucose, Bld 108 (*)    BUN 24 (*)    All other components within normal limits  URINALYSIS, COMPLETE (UACMP) WITH MICROSCOPIC - Abnormal; Notable for the following components:   Color, Urine AMBER (*)    APPearance HAZY (*)    Ketones, ur 5 (*)    Leukocytes,Ua TRACE (*)    All other components within normal limits  BRAIN NATRIURETIC PEPTIDE  TROPONIN I (HIGH SENSITIVITY)  ____________________________________________  EKG  Sinus rhythm with a ventricular rate of 70, right bundle branch block, left anterior fascicle block, PR interval of 237  and some nonspecific changes in inferior leads and lateral leads versus artifact. ____________________________________________  RADIOLOGY  ED MD interpretation: Cardiomegaly and very mild interstitial opacities possibly early edema no focal consolidation, effusion, no thorax or other clear acute intrathoracic process.  Official radiology report(s): DG Chest 2 View  Result Date: 07/07/2020 CLINICAL DATA:  dizziness EXAM: CHEST - 2 VIEW COMPARISON:  Radiograph 12/15/2016 FINDINGS: Unchanged, enlarged cardiac silhouette. There are mild interstitial opacities. No focal airspace consolidation. Small bilateral pleural effusions. No visible pneumothorax. No acute osseous abnormality. IMPRESSION: Cardiomegaly with mild interstitial opacities, favored to be mild interstitial edema Electronically Signed   By: Caprice Renshaw   On: 07/07/2020 11:38    ____________________________________________   PROCEDURES  Procedure(s) performed (including Critical Care):  .1-3 Lead EKG Interpretation  Date/Time: 07/07/2020 1:14 PM Performed by: Gilles Chiquito, MD Authorized by: Gilles Chiquito, MD     Interpretation: normal     ECG rate assessment: normal     Rhythm: sinus rhythm     Ectopy: none     Conduction: abnormal     ____________________________________________   INITIAL IMPRESSION / ASSESSMENT AND PLAN / ED COURSE      Patient presents with above to history exam for assessment of some orthostatic symptomatic lightheadedness over the last 2 days.  On arrival her BP is 146/85.  She is otherwise stable on room air.  Patient is noted to be orthostatic.  Differential includes ACS, PE, dehydration, arrhythmia, symptomatic anemia and metabolic derangements  ECG and troponin of 6 obtained greater than 2 hours after symptom onset is not suggestive of ACS.  No significant arrhythmia identified.  CMP with NA of 127 without any other clear acute derangements.  CBC with WBC count of 10.9 without acute  anemia.  UA with trace leukocyte Estrace but otherwise no evidence of infection and given she denies any urinary symptoms I have a lower suspicion for a cystitis at this time.  After receiving IV fluids orthostatic pressures were again taken.  While patient is still hypertensive lying down with systolics in the 180s to 190s on standing she is now having systolic in the 170s.  Patient had possibly some very faint early edema on her chest x-ray she denies any weight gain orthopnea and has no significant edema on exam and elicitation for acute heart failure.  She does states she no longer feels dizzy or lightheaded on standing after receiving IV fluids..  Given improvement in orthostatic blood pressures patient denying any lightheadedness or patient stable with outpatient follow-up.  Advised to drink plenty of fluids and follow-up with her PCP in the next 2 or 3 days to have her blood pressure medicines adjusted.  She is amenable with plan.  Discharged to condition patient precautions advised and discussed.  ____________________________________________   FINAL CLINICAL IMPRESSION(S) / ED DIAGNOSES  Final diagnoses:  Orthostatic dizziness    Medications  lactated ringers bolus 1,000 mL (1,000 mLs Intravenous New Bag/Given 07/07/20 1151)     ED Discharge Orders     None        Note:  This document was prepared using Dragon voice recognition software and may include unintentional dictation errors.    Gilles Chiquito, MD 07/07/20 1315

## 2020-09-06 ENCOUNTER — Other Ambulatory Visit: Payer: Self-pay | Admitting: Internal Medicine

## 2020-09-06 DIAGNOSIS — Z1231 Encounter for screening mammogram for malignant neoplasm of breast: Secondary | ICD-10-CM

## 2020-10-31 ENCOUNTER — Ambulatory Visit
Admission: RE | Admit: 2020-10-31 | Discharge: 2020-10-31 | Disposition: A | Discharge status: Home / Self Care | Payer: Medicare Other | Source: Ambulatory Visit | Attending: Internal Medicine | Admitting: Internal Medicine

## 2020-10-31 ENCOUNTER — Other Ambulatory Visit: Payer: Self-pay

## 2020-10-31 DIAGNOSIS — Z1231 Encounter for screening mammogram for malignant neoplasm of breast: Secondary | ICD-10-CM | POA: Diagnosis not present

## 2021-03-13 ENCOUNTER — Other Ambulatory Visit: Payer: Self-pay | Admitting: Internal Medicine

## 2021-03-13 DIAGNOSIS — M79604 Pain in right leg: Secondary | ICD-10-CM

## 2021-03-21 ENCOUNTER — Ambulatory Visit
Admission: RE | Admit: 2021-03-21 | Discharge: 2021-03-21 | Disposition: A | Discharge status: Home / Self Care | Payer: Medicare Other | Source: Ambulatory Visit | Attending: Internal Medicine | Admitting: Internal Medicine

## 2021-03-21 ENCOUNTER — Other Ambulatory Visit: Payer: Self-pay

## 2021-03-21 DIAGNOSIS — M79604 Pain in right leg: Secondary | ICD-10-CM | POA: Insufficient documentation

## 2021-03-22 ENCOUNTER — Ambulatory Visit: Payer: Medicare Other | Attending: Neurology | Admitting: Speech Pathology

## 2021-03-22 DIAGNOSIS — G20 Parkinson's disease: Secondary | ICD-10-CM | POA: Diagnosis present

## 2021-03-22 DIAGNOSIS — G20A1 Parkinson's disease without dyskinesia, without mention of fluctuations: Secondary | ICD-10-CM

## 2021-03-22 DIAGNOSIS — R471 Dysarthria and anarthria: Secondary | ICD-10-CM | POA: Diagnosis present

## 2021-03-22 NOTE — Therapy (Signed)
[place] MAIN [facility] SERVICES [address] [place], [place], [address] Phone: [phone]   Fax:  [phone]  Speech Language Pathology Evaluation  Patient Details  Name: Katelyn Crawford MRN: [id number] Date of Birth: Oct 12, 1940 Referring Provider (SLP): Dr. Jennings Books   Encounter Date: 03/22/2021   End of Session - 03/22/21 1243     Visit Number 1    Number of Visits 17    Date for SLP Re-Evaluation 05/21/21   extended to allow for scheduling or missed visit make-ups if necessary   Authorization Type Medicare    Authorization - Visit Number 1    Progress Note Due on Visit 10    SLP Start Time 1100    SLP Stop Time  1203    SLP Time Calculation (min) 63 min    Activity Tolerance Patient tolerated treatment well             Past Medical History:  Diagnosis Date   Arthritis    lower back   Cardiomyopathy ([place])    with diastolic dysfunction   Chronic sinus bradycardia    Coronary artery disease 06/24/2013   1. 03/10/12:Diffuse 90% stenosis mid right coronary artery treated medically because of inability to cannilate the vessel during cardiac catherization 03/10/2012   Dizziness    chronic   Environmental allergies    w/ allergic rhinitis   GERD (gastroesophageal reflux disease) 06/25/2013   H/O vertigo    Heart disease    History of degenerative disc disease    History of diverticulosis    History of kidney stones    History of seasonal allergies    Hyperlipidemia, unspecified    Hypertension    Nephrolithiasis    Non-cardiac chest pain    Osteoporosis, post-menopausal    Parkinson disease ([place])    PVC's (premature ventricular contractions)    Vascular disease    Wears dentures    full upper and lower    Past Surgical History:  Procedure Laterality Date   APPENDECTOMY     BREAST EXCISIONAL BIOPSY Left 1984   neg   BREAST EXCISIONAL BIOPSY Right 1997   lipoma   CATARACT EXTRACTION W/PHACO Left 09/02/2017    Procedure: CATARACT EXTRACTION PHACO AND INTRAOCULAR LENS PLACEMENT ([place])  LEFT;  Surgeon: Leandrew Koyanagi, MD;  Location: [place];  Service: Ophthalmology;  Laterality: Left;   CATARACT EXTRACTION W/PHACO Right 10/01/2017   Procedure: CATARACT EXTRACTION PHACO AND INTRAOCULAR LENS PLACEMENT ([place]) RIGHT;  Surgeon: Leandrew Koyanagi, MD;  Location: [place];  Service: Ophthalmology;  Laterality: Right;   COLONOSCOPY WITH PROPOFOL N/A 11/27/2016   Procedure: COLONOSCOPY WITH PROPOFOL;  Surgeon: Toledo, Benay Pike, MD;  Location: [facility];  Service: Gastroenterology;  Laterality: N/A;   CORONARY ANGIOPLASTY WITH STENT PLACEMENT     KNEE ARTHROPLASTY Left 01/22/2017   Procedure: COMPUTER ASSISTED TOTAL KNEE ARTHROPLASTY;  Surgeon: Dereck Leep, MD;  Location: [facility];  Service: Orthopedics;  Laterality: Left;   KNEE ARTHROSCOPY Left 02/01/2015   Procedure: ARTHROSCOPY KNEE, partial medial & lateral menisectomy,,medial and patelofemoral chondroplasty;  Surgeon: Dereck Leep, MD;  Location: [facility];  Service: Orthopedics;  Laterality: Left;   LITHOTRIPSY Right 1995   RIGHT AXILLARY LIPOMA REMOVAL     TUBAL LIGATION     VARICOSE VEIN SURGERY      There were no vitals filed for this visit.   Subjective Assessment - 03/22/21 1234     Subjective My daughter says, "I  can't hear you."    Currently in Pain? Yes    Pain Score 5     Pain Location Leg    Pain Orientation Right                SLP Evaluation [facility] - 03/22/21 1234       SLP Visit Information   SLP Received On 03/22/21    Referring Provider (SLP) Dr. Cristopher Peru    Onset Date 01/04/21   referral date; PD dx 2019   Medical Diagnosis Parkinson's disease      Subjective   Patient/Family Stated Goal Get my speaking voice better      General Information   HPI Patient is an 81 y.o. female with past medical history noted for Parkinson's disease (dx 2019), GERD, HTN, CAD, osteoarthritis  referred by Dr. Sherryll Burger for LSVT LOUD evaluation and treatment. Pt known to SLP from prior OP MBS on 04/12/20 with findings of mild oral dysphagia. Regular diet and thin liquids was recommended. Per MD notes pt family noting hoarse, raspy vocal quality, drooling.    Behavioral/Cognition alert, cooperative, pleasant    Mobility Status ambulated unassisted but slowly due to left leg pain      Balance Screen   Has the patient fallen in the past 6 months No    Has the patient had a decrease in activity level because of a fear of falling?  No    Is the patient reluctant to leave their home because of a fear of falling?  No      Prior Functional Status   Cognitive/Linguistic Baseline Within functional limits    Type of Home House     Lives With Alone;Other (Comment)   daughter lives next door   Available Support Family    Vocation Retired      IT consultant   Overall Cognitive Status Within Functional Limits for tasks assessed   not formally assessed; slow processing at times     Auditory Comprehension   Overall Auditory Comprehension Appears within functional limits for tasks assessed    Yes/No Questions Within Functional Limits    Commands Within Functional Limits    Conversation Moderately complex    Interfering Components Hearing   wears bilateral HA   EffectiveTechniques Extra processing time;Repetition   written keywords     Visual Recognition/Discrimination   Discrimination Not tested      Reading Comprehension   Reading Status Within funtional limits      Expression   Primary Mode of Expression Verbal      Verbal Expression   Overall Verbal Expression Appears within functional limits for tasks assessed      Written Expression   Dominant Hand Right    Written Expression Not tested      Oral Motor/Sensory Function   Overall Oral Motor/Sensory Function Impaired    Labial ROM Reduced left;Reduced right    Labial Symmetry Within Functional Limits    Labial Strength Within  Functional Limits    Lingual ROM Within Functional Limits    Lingual Symmetry Within Functional Limits    Lingual Strength Within Functional Limits    Lingual Coordination WFL    Facial ROM Reduced right;Reduced left    Facial Symmetry Within Functional Limits    Facial Strength Within Functional Limits    Velum Within Functional Limits    Mandible Within Functional Limits    Overall Oral Motor/Sensory Function noted small sores on left lower lip, left lateral surface of tongue  Motor Speech   Overall Motor Speech Impaired    Respiration Impaired    Level of Impairment Sentence    Phonation Hoarse;Low vocal intensity    Resonance Within functional limits    Articulation Within functional limitis    Intelligibility Intelligible    Motor Planning Witnin functional limits    Effective Techniques Increased vocal intensity    Phonation Impaired    Tension Present Neck;Shoulder    Volume Soft    Pitch Low      Standardized Assessments   Standardized Assessments  Other Assessment             LSVT-LOUD Voice Evaluation Maximum phonation time for sustained ah: 7.9 (strained/gravelly quality) Mean intensity during sustained ah: 69 dB  Mean fundamental frequency during sustained ah: 146Hz  (3.6 STD below average of 244 Hz +/-27 for age and gender) Mean intensity sustained during conversational speech: 66 dB Habitual pitch: 183 Hz Highest dynamic pitch when altering pitch from a low note to a high note: 355 Hz Lowest dynamic pitch when altering from a high note to a low note: 125 Hz Highest dynamic pitch during conversational speech: 82 Hz Lowest dynamic pitch during conversational speech: 285 Hz  Speech is characterized by hoarse vocal quality, reduced pitch variability and reduced vocal intensity. For trained listener in quiet environment with context, intelligibility was 100%; pt reports difficulties in restaurants, with background noise or in group settings.    Patient able to improve all parameters with model (Loud like me) Stimulability: Improved vocal quality with loud voice or paragraph reading (78 dB) and  conversation (77 dB) and functional phrases averaged 78dB.  Improved pitch range with model (124-320 Hz).     The Communication Effectiveness Survey is a patient-reported outcome measure in which the patient rates their own effectiveness in different communication situations. A higher score indicates greater effectiveness. Pt's self-rating was 18/32. Patient reported difficulty communicating in noisier environments and on the phone.      SLP Education - 03/22/21 1243     Education Details LSVT LOUD program, course of therapy, proposed plan of care    Person(s) Educated Patient    Methods Explanation    Comprehension Verbalized understanding;Need further instruction              SLP Short Term Goals - 03/22/21 1244       SLP SHORT TERM GOAL #1   Title The patient will complete Daily Tasks (Maximum duration "ah", High/Lows, and Functional Phrases) at average loudness >/= 80 dB and with loud, good quality voice with min cues.    Time 10    Period --   visits   Status New      SLP SHORT TERM GOAL #2   Title The patient will complete Hierarchal Speech Loudness reading drills (words/phrases, sentences) at average >/= 75 dB and with loud, good quality voice with min cues.    Time 10    Period --   visits   Status New      SLP SHORT TERM GOAL #3   Title The patient will participate in 5-8 minutes conversation, maintaining average loudness of 75 dB and good quality voice with modified independence.    Time 10    Period --   visits   Status New              SLP Long Term Goals - 03/22/21 1245       SLP LONG TERM GOAL #1   Title The  patient will complete Daily Tasks (Maximum duration "ah", High/Lows, and Functional Phrases) at average loudness >/= 80 dB and with loud, good quality voice.    Time 12    Period Weeks     Status New    Target Date 05/21/21      SLP LONG TERM GOAL #2   Title The patient will complete Hierarchal Speech Loudness reading drills (words/phrases, sentences, and paragraph) at average >/= 75 dB and with loud, good quality voice.    Time 12    Period Weeks    Status New    Target Date 05/21/21      SLP LONG TERM GOAL #3   Title The patient will participate in 20 minutes conversation, maintaining average loudness of >/= 75 dB and loud, good quality voice.    Time 12    Period Weeks    Status New    Target Date 05/21/21      SLP LONG TERM GOAL #4   Title The patient will report improved communication effectiveness as measured by the Communication Effectiveness Survey.    Time 12    Period Weeks    Status New    Target Date 05/21/21              Plan - 03/22/21 1247     Clinical Impression Statement Patient presents with mild-moderate hypokinetic dysarthria characterized by hoarse, gravelly vocal quality, reduced pitch variation and reduced vocal intensity. Patient reports requests to repeat herself from friends and family, and has noticed a decline in her vocal quality over several years. She reports ongoing mild dysphagia without change in symptoms since MBS completed last year on 04/12/20- no recent hx pneumonia or respiratory difficulties. Patient did exhibit some strain during phonation tasks with stimulability testing, but was stimulable for improved loudness, pitch variation and vocal quality in reading and conversation using model and cues for "Loud, like me." Recommend course of LSVT LOUD to maximize pt's vocal quality and intelligibility for conversations with family and friends to reduce social isolation and improve quality of life.    Speech Therapy Frequency 4x / week    Duration 4 weeks    Treatment/Interventions Aspiration precaution training;Cueing hierarchy;SLP instruction and feedback;Functional tasks;Patient/family education;Other (comment)   LSVT LOUD    Potential to Achieve Goals Good    SLP Home Exercise Plan LSVT LOUD Daily tasks    Consulted and Agree with Plan of Care Patient             Patient will benefit from skilled therapeutic intervention in order to improve the following deficits and impairments:   Dysarthria and anarthria  Parkinson disease ([place])    Problem List Patient Active Problem List   Diagnosis Date Noted   Primary osteoarthritis of hip 02/09/2017   Primary osteoarthritis of knee 02/09/2017   Chronic sinus bradycardia 01/22/2017   Degenerative disc disease, lumbar 01/22/2017   Dizziness 01/22/2017   Nephrolithiasis 01/22/2017   Osteoporosis, 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/22/2017   PVC's (premature ventricular contractions) 01/22/2017   S/P total knee arthroplasty 01/22/2017   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

## 2021-03-26 ENCOUNTER — Ambulatory Visit: Payer: Medicare Other | Admitting: Speech Pathology

## 2021-03-26 ENCOUNTER — Other Ambulatory Visit: Payer: Self-pay

## 2021-03-26 DIAGNOSIS — G20A1 Parkinson's disease without dyskinesia, without mention of fluctuations: Secondary | ICD-10-CM

## 2021-03-26 DIAGNOSIS — R471 Dysarthria and anarthria: Secondary | ICD-10-CM

## 2021-03-26 DIAGNOSIS — G20 Parkinson's disease: Secondary | ICD-10-CM

## 2021-03-26 NOTE — Patient Instructions (Addendum)
Functional Phrases ? ?Hello! ? ?How are you? ? ?3. What have you been doing? ? ?4. Have you been working? ? ?5. What time you coming home? ? ?6. Are y'all gonna eat with me? ? ?7. I haven't been doing anything. ? ?8. Are we going to plant a garden.  ? ?9. We need to water.  ? ?10. My leg is killing me.  ?

## 2021-03-26 NOTE — Therapy (Signed)
[place] MAIN [facility] SERVICES [address] [place], [place], [address] Phone: [phone]   Fax:  [phone]  Speech Language Pathology Treatment  Patient Details  Name: Katelyn Crawford MRN: [id number] Date of Birth: [birth year] Referring Provider (SLP): Dr. Jennings Books   Encounter Date: 03/26/2021   End of Session - 03/26/21 1657     Visit Number 2    Number of Visits 17    Date for SLP Re-Evaluation 05/21/21   extended to allow for scheduling or missed visit make-ups if necessary   Authorization Type Medicare    Authorization - Visit Number 2    Progress Note Due on Visit 10    SLP Start Time 1100    SLP Stop Time  1205    SLP Time Calculation (min) 65 min    Activity Tolerance Patient tolerated treatment well             Past Medical History:  Diagnosis Date   Arthritis    lower back   Cardiomyopathy ([place])    with diastolic dysfunction   Chronic sinus bradycardia    Coronary artery disease 06/24/2013   1. 03/10/12:Diffuse 90% stenosis mid right coronary artery treated medically because of inability to cannilate the vessel during cardiac catherization 03/10/2012   Dizziness    chronic   Environmental allergies    w/ allergic rhinitis   GERD (gastroesophageal reflux disease) 06/25/2013   H/O vertigo    Heart disease    History of degenerative disc disease    History of diverticulosis    History of kidney stones    History of seasonal allergies    Hyperlipidemia, unspecified    Hypertension    Nephrolithiasis    Non-cardiac chest pain    Osteoporosis, post-menopausal    Parkinson disease ([place])    PVC's (premature ventricular contractions)    Vascular disease    Wears dentures    full upper and lower    Past Surgical History:  Procedure Laterality Date   APPENDECTOMY     BREAST EXCISIONAL BIOPSY Left 1984   neg   BREAST EXCISIONAL BIOPSY Right 1997   lipoma   CATARACT EXTRACTION W/PHACO Left 09/02/2017    Procedure: CATARACT EXTRACTION PHACO AND INTRAOCULAR LENS PLACEMENT ([place])  LEFT;  Surgeon: Leandrew Koyanagi, MD;  Location: [place];  Service: Ophthalmology;  Laterality: Left;   CATARACT EXTRACTION W/PHACO Right 10/01/2017   Procedure: CATARACT EXTRACTION PHACO AND INTRAOCULAR LENS PLACEMENT ([place]) RIGHT;  Surgeon: Leandrew Koyanagi, MD;  Location: [place];  Service: Ophthalmology;  Laterality: Right;   COLONOSCOPY WITH PROPOFOL N/A 11/27/2016   Procedure: COLONOSCOPY WITH PROPOFOL;  Surgeon: Toledo, Benay Pike, MD;  Location: [facility];  Service: Gastroenterology;  Laterality: N/A;   CORONARY ANGIOPLASTY WITH STENT PLACEMENT     KNEE ARTHROPLASTY Left 01/22/2017   Procedure: COMPUTER ASSISTED TOTAL KNEE ARTHROPLASTY;  Surgeon: Dereck Leep, MD;  Location: [facility];  Service: Orthopedics;  Laterality: Left;   KNEE ARTHROSCOPY Left 02/01/2015   Procedure: ARTHROSCOPY KNEE, partial medial & lateral menisectomy,,medial and patelofemoral chondroplasty;  Surgeon: Dereck Leep, MD;  Location: [facility];  Service: Orthopedics;  Laterality: Left;   LITHOTRIPSY Right 1995   RIGHT AXILLARY LIPOMA REMOVAL     TUBAL LIGATION     VARICOSE VEIN SURGERY      There were no vitals filed for this visit.   Subjective Assessment - 03/26/21 1358     Subjective Pt reports knee/leg is  painful today    Currently in Pain? Yes    Pain Score 8     Pain Location Leg    Pain Orientation Right                   ADULT SLP TREATMENT - 03/26/21 1359       General Information   Behavior/Cognition Alert;Cooperative;Pleasant mood    HPI Patient is an 81 y.o. female with past medical history noted for Parkinson's disease (dx 2019), GERD, HTN, CAD, osteoarthritis referred by Dr. Manuella Ghazi for LSVT LOUD evaluation and treatment. Pt known to SLP from prior OP MBS on 04/12/20 with findings of mild oral dysphagia. Regular diet and thin liquids was recommended. Per MD notes pt family  noting hoarse, raspy vocal quality, drooling.      Treatment Provided   Treatment provided Cognitive-Linquistic      Cognitive-Linquistic Treatment   Treatment focused on Dysarthria;Patient/family/caregiver education    Skilled Treatment Education provided in HEP for LSVT LOUD with supporting homework sheets provided. LSVT LOUD Daily tasks: Loud /a/ averaged 75 dB with usual mod cues for shaping loudness, pitch and quality, duration 4.6 seconds.  Max F0 for pitch glides was 370Hz  (avg 75 dB) with usual mod cues for pitch, loudness and breath support, min F0 for low glides was 152 Hz (74 dB), with usual min-mod cues. Functional phrases averaged 78 dB with occasional min cues for loudness. Hierarchical loudness drills at word/phrase level averaged 78 dB with occasional mod cues for loudness. Carryover task: Greet daughter with LOUD voice.      Assessment / Recommendations / Plan   Plan Continue with current plan of care      Progression Toward Goals   Progression toward goals Progressing toward goals              SLP Education - 03/26/21 1657     Education Details LSVT Daily Tasks    Person(s) Educated Patient    Methods Explanation    Comprehension Verbalized understanding              SLP Short Term Goals - 03/22/21 1244       SLP SHORT TERM GOAL #1   Title The patient will complete Daily Tasks (Maximum duration "ah", High/Lows, and Functional Phrases) at average loudness >/= 80 dB and with loud, good quality voice with min cues.    Time 10    Period --   visits   Status New      SLP SHORT TERM GOAL #2   Title The patient will complete Hierarchal Speech Loudness reading drills (words/phrases, sentences) at average >/= 75 dB and with loud, good quality voice with min cues.    Time 10    Period --   visits   Status New      SLP SHORT TERM GOAL #3   Title The patient will participate in 5-8 minutes conversation, maintaining average loudness of 75 dB and good quality  voice with modified independence.    Time 10    Period --   visits   Status New              SLP Long Term Goals - 03/22/21 1245       SLP LONG TERM GOAL #1   Title The patient will complete Daily Tasks (Maximum duration "ah", High/Lows, and Functional Phrases) at average loudness >/= 80 dB and with loud, good quality voice.    Time 12    Period  Weeks    Status New    Target Date 05/21/21      SLP LONG TERM GOAL #2   Title The patient will complete Hierarchal Speech Loudness reading drills (words/phrases, sentences, and paragraph) at average >/= 75 dB and with loud, good quality voice.    Time 12    Period Weeks    Status New    Target Date 05/21/21      SLP LONG TERM GOAL #3   Title The patient will participate in 20 minutes conversation, maintaining average loudness of >/= 75 dB and loud, good quality voice.    Time 12    Period Weeks    Status New    Target Date 05/21/21      SLP LONG TERM GOAL #4   Title The patient will report improved communication effectiveness as measured by the Communication Effectiveness Survey.    Time 12    Period Weeks    Status New    Target Date 05/21/21              Plan - 03/26/21 1658     Clinical Impression Statement Patient presents with mild-moderate hypokinetic dysarthria characterized by hoarse, gravelly vocal quality, reduced pitch variation and reduced vocal intensity. With model and usual mod cues, pt stimulable for improved vocal quality in LSVT daily tasks as well as hierarchy drills at word/phrase level. Able to reduce strain/tension with modeling of breath support. Recommend course of LSVT LOUD to maximize pt's vocal quality and intelligibility for conversations with family and friends to reduce social isolation and improve quality of life.    Speech Therapy Frequency 4x / week    Duration 4 weeks    Treatment/Interventions Aspiration precaution training;Cueing hierarchy;SLP instruction and feedback;Functional  tasks;Patient/family education;Other (comment)   LSVT LOUD   Potential to Achieve Goals Good    SLP Home Exercise Plan LSVT LOUD Daily tasks    Consulted and Agree with Plan of Care Patient             Patient will benefit from skilled therapeutic intervention in order to improve the following deficits and impairments:   Dysarthria and anarthria  Parkinson disease ([place])    Problem List Patient Active Problem List   Diagnosis Date Noted   Primary osteoarthritis of hip 02/09/2017   Primary osteoarthritis of knee 02/09/2017   Chronic sinus bradycardia 01/22/2017   Degenerative disc disease, lumbar 01/22/2017   Dizziness 01/22/2017   Nephrolithiasis 01/22/2017   Osteoporosis, 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/22/2017   PVC's (premature ventricular contractions) 01/22/2017   S/P total knee arthroplasty 01/22/2017   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

## 2021-03-27 ENCOUNTER — Ambulatory Visit: Payer: Medicare Other | Admitting: Speech Pathology

## 2021-03-27 DIAGNOSIS — G20 Parkinson's disease: Secondary | ICD-10-CM

## 2021-03-27 DIAGNOSIS — R471 Dysarthria and anarthria: Secondary | ICD-10-CM | POA: Diagnosis not present

## 2021-03-27 NOTE — Therapy (Signed)
[facility] MAIN [facility] SERVICES [address] [place], [place], [address] Phone: [phone]   Fax:  [phone]  Speech Language Pathology Treatment  Patient Details  Name: Katelyn Crawford MRN: [id number] Date of Birth: 02-21-1940 Referring Provider (SLP): Dr. Cristopher Peru   Encounter Date: 03/27/2021   End of Session - 03/27/21 1438     Visit Number 3    Number of Visits 17    Date for SLP Re-Evaluation 05/21/21   extended to allow for scheduling or missed visit make-ups if necessary   Authorization Type Medicare    Authorization - Visit Number 3    Progress Note Due on Visit 10    SLP Start Time 1105    SLP Stop Time  1205    SLP Time Calculation (min) 60 min    Activity Tolerance Patient tolerated treatment well             Past Medical History:  Diagnosis Date   Arthritis    lower back   Cardiomyopathy (HCC)    with diastolic dysfunction   Chronic sinus bradycardia    Coronary artery disease 06/24/2013   1. 03/10/12:Diffuse 90% stenosis mid right coronary artery treated medically because of inability to cannilate the vessel during cardiac catherization 03/10/2012   Dizziness    chronic   Environmental allergies    w/ allergic rhinitis   GERD (gastroesophageal reflux disease) 06/25/2013   H/O vertigo    Heart disease    History of degenerative disc disease    History of diverticulosis    History of kidney stones    History of seasonal allergies    Hyperlipidemia, unspecified    Hypertension    Nephrolithiasis    Non-cardiac chest pain    Osteoporosis, post-menopausal    Parkinson disease (HCC)    PVC's (premature ventricular contractions)    Vascular disease    Wears dentures    full upper and lower    Past Surgical History:  Procedure Laterality Date   APPENDECTOMY     BREAST EXCISIONAL BIOPSY Left 1984   neg   BREAST EXCISIONAL BIOPSY Right 1997   lipoma   CATARACT EXTRACTION W/PHACO Left 09/02/2017    Procedure: CATARACT EXTRACTION PHACO AND INTRAOCULAR LENS PLACEMENT (IOC)  LEFT;  Surgeon: Lockie Mola, MD;  Location: [facility];  Service: Ophthalmology;  Laterality: Left;   CATARACT EXTRACTION W/PHACO Right 10/01/2017   Procedure: CATARACT EXTRACTION PHACO AND INTRAOCULAR LENS PLACEMENT (IOC) RIGHT;  Surgeon: Lockie Mola, MD;  Location: [facility];  Service: Ophthalmology;  Laterality: Right;   COLONOSCOPY WITH PROPOFOL N/A 11/27/2016   Procedure: COLONOSCOPY WITH PROPOFOL;  Surgeon: Toledo, Boykin Nearing, MD;  Location: [facility];  Service: Gastroenterology;  Laterality: N/A;   CORONARY ANGIOPLASTY WITH STENT PLACEMENT     KNEE ARTHROPLASTY Left 01/22/2017   Procedure: COMPUTER ASSISTED TOTAL KNEE ARTHROPLASTY;  Surgeon: Donato Heinz, MD;  Location: [facility];  Service: Orthopedics;  Laterality: Left;   KNEE ARTHROSCOPY Left 02/01/2015   Procedure: ARTHROSCOPY KNEE, partial medial & lateral menisectomy,,medial and patelofemoral chondroplasty;  Surgeon: Donato Heinz, MD;  Location: [facility];  Service: Orthopedics;  Laterality: Left;   LITHOTRIPSY Right 1995   RIGHT AXILLARY LIPOMA REMOVAL     TUBAL LIGATION     VARICOSE VEIN SURGERY      There were no vitals filed for this visit.   Subjective Assessment - 03/27/21 1413     Subjective Pt reports using her  loud voice with her daughter yesterday.    Currently in Pain? Yes    Pain Score 8     Pain Location Leg    Pain Orientation Right                   ADULT SLP TREATMENT - 03/27/21 1413       General Information   Behavior/Cognition Alert;Cooperative;Pleasant mood    HPI Patient is an 81 y.o. female with past medical history noted for Parkinson's disease (dx 2019), GERD, HTN, CAD, osteoarthritis referred by Dr. Manuella Ghazi for LSVT LOUD evaluation and treatment. Pt known to SLP from prior OP MBS on 04/12/20 with findings of mild oral dysphagia. Regular diet and thin liquids was recommended. Per  MD notes pt family noting hoarse, raspy vocal quality, drooling.      Cognitive-Linquistic Treatment   Treatment focused on Dysarthria;Patient/family/caregiver education    Skilled Treatment Pt completed HEP as assigned. LSVT LOUD Daily tasks: Loud /a/ averaged 76 dB with usual mod cues for shaping loudness, pitch and quality, duration 5.7 seconds.  Max F0 for pitch glides was 360Hz  (avg 77 dB) with usual mod cues for pitch, loudness and breath support, min F0 for low glides was 175 Hz (76 dB), with usual min-mod cues. Functional phrases averaged 72 dB with occasional min cues for loudness. Hierarchical loudness drills at word/phrase level averaged 72 dB with rare min cues for loudness, with reduced avg intensity of 71 dB, mod cues when cognitive load added. Carryover task: use loud voice with tax preparer tomorrow.      Assessment / Recommendations / Plan   Plan Continue with current plan of care      Progression Toward Goals   Progression toward goals Progressing toward goals              SLP Education - 03/27/21 1428     Education Details think loud!    Person(s) Educated Patient    Methods Explanation    Comprehension Verbalized understanding              SLP Short Term Goals - 03/22/21 1244       SLP SHORT TERM GOAL #1   Title The patient will complete Daily Tasks (Maximum duration "ah", High/Lows, and Functional Phrases) at average loudness >/= 80 dB and with loud, good quality voice with min cues.    Time 10    Period --   visits   Status New      SLP SHORT TERM GOAL #2   Title The patient will complete Hierarchal Speech Loudness reading drills (words/phrases, sentences) at average >/= 75 dB and with loud, good quality voice with min cues.    Time 10    Period --   visits   Status New      SLP SHORT TERM GOAL #3   Title The patient will participate in 5-8 minutes conversation, maintaining average loudness of 75 dB and good quality voice with modified  independence.    Time 10    Period --   visits   Status New              SLP Long Term Goals - 03/22/21 1245       SLP LONG TERM GOAL #1   Title The patient will complete Daily Tasks (Maximum duration "ah", High/Lows, and Functional Phrases) at average loudness >/= 80 dB and with loud, good quality voice.    Time 12    Period Weeks  Status New    Target Date 05/21/21      SLP LONG TERM GOAL #2   Title The patient will complete Hierarchal Speech Loudness reading drills (words/phrases, sentences, and paragraph) at average >/= 75 dB and with loud, good quality voice.    Time 12    Period Weeks    Status New    Target Date 05/21/21      SLP LONG TERM GOAL #3   Title The patient will participate in 20 minutes conversation, maintaining average loudness of >/= 75 dB and loud, good quality voice.    Time 12    Period Weeks    Status New    Target Date 05/21/21      SLP LONG TERM GOAL #4   Title The patient will report improved communication effectiveness as measured by the Communication Effectiveness Survey.    Time 12    Period Weeks    Status New    Target Date 05/21/21              Plan - 03/27/21 1438     Clinical Impression Statement Patient presents with mild-moderate hypokinetic dysarthria characterized by hoarse, gravelly vocal quality, reduced pitch variation and reduced vocal intensity. With model and usual mod cues, pt stimulable for improved vocal quality in LSVT daily tasks as well as hierarchy drills at word/phrase level. Able to reduce strain/tension with modeling of breath support. Able to progress to phrase level tasks with cognitive load with mod cues today. Recommend course of LSVT LOUD to maximize pt's vocal quality and intelligibility for conversations with family and friends to reduce social isolation and improve quality of life.    Speech Therapy Frequency 4x / week    Duration 4 weeks    Treatment/Interventions Aspiration precaution  training;Cueing hierarchy;SLP instruction and feedback;Functional tasks;Patient/family education;Other (comment)   LSVT LOUD   Potential to Achieve Goals Good    SLP Home Exercise Plan LSVT LOUD Daily tasks    Consulted and Agree with Plan of Care Patient             Patient will benefit from skilled therapeutic intervention in order to improve the following deficits and impairments:   Dysarthria and anarthria  Parkinson disease ([place])    Problem List Patient Active Problem List   Diagnosis Date Noted   Primary osteoarthritis of hip 02/09/2017   Primary osteoarthritis of knee 02/09/2017   Chronic sinus bradycardia 01/22/2017   Degenerative disc disease, lumbar 01/22/2017   Dizziness 01/22/2017   Nephrolithiasis 01/22/2017   Osteoporosis, 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/22/2017   PVC's (premature ventricular contractions) 01/22/2017   S/P total knee arthroplasty 01/22/2017   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   CKD (chronic kidney disease), 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   GERD (gastroesophageal reflux disease) 06/25/2013   Coronary artery disease involving native coronary artery of native heart 06/24/2013   Essential hypertension 06/21/2013   Mixed hyperlipidemia 06/21/2013   Deneise Lever, MS, CCC-SLP Speech-Language Pathologist [phone]  Aliene Altes, [place] 03/27/2021, 2:39 PM  [place] MAIN [facility] SERVICES [address] [place], [place], [address] Phone: [phone]   Fax:  [phone]   Name: Katelyn Crawford MRN: [id number] Date of Birth: 03-28-1940

## 2021-03-28 ENCOUNTER — Ambulatory Visit: Payer: Medicare Other | Admitting: Speech Pathology

## 2021-03-29 ENCOUNTER — Ambulatory Visit: Payer: Medicare Other | Admitting: Speech Pathology

## 2021-03-29 ENCOUNTER — Other Ambulatory Visit: Payer: Self-pay

## 2021-03-29 DIAGNOSIS — R471 Dysarthria and anarthria: Secondary | ICD-10-CM

## 2021-03-29 DIAGNOSIS — G20A1 Parkinson's disease without dyskinesia, without mention of fluctuations: Secondary | ICD-10-CM

## 2021-03-29 DIAGNOSIS — G20 Parkinson's disease: Secondary | ICD-10-CM

## 2021-03-29 NOTE — Therapy (Signed)
[place] MAIN [facility] SERVICES [address] [place], [place], [address] Phone: [phone]   Fax:  [phone]  Speech Language Pathology Treatment  Patient Details  Name: Katelyn Crawford MRN: [id number] Date of Birth: Jan 12, 1941 Referring Provider (SLP): Dr. Jennings Books   Encounter Date: 03/29/2021   End of Session - 03/29/21 1242     Visit Number 4    Number of Visits 17    Date for SLP Re-Evaluation 05/21/21   extended to allow for scheduling or missed visit make-ups if necessary   Authorization Type Medicare    Authorization - Visit Number 4    Progress Note Due on Visit 10    SLP Start Time 1100    SLP Stop Time  1200    SLP Time Calculation (min) 60 min    Activity Tolerance Patient tolerated treatment well             Past Medical History:  Diagnosis Date   Arthritis    lower back   Cardiomyopathy ([place])    with diastolic dysfunction   Chronic sinus bradycardia    Coronary artery disease 06/24/2013   1. 03/10/12:Diffuse 90% stenosis mid right coronary artery treated medically because of inability to cannilate the vessel during cardiac catherization 03/10/2012   Dizziness    chronic   Environmental allergies    w/ allergic rhinitis   GERD (gastroesophageal reflux disease) 06/25/2013   H/O vertigo    Heart disease    History of degenerative disc disease    History of diverticulosis    History of kidney stones    History of seasonal allergies    Hyperlipidemia, unspecified    Hypertension    Nephrolithiasis    Non-cardiac chest pain    Osteoporosis, post-menopausal    Parkinson disease ([place])    PVC's (premature ventricular contractions)    Vascular disease    Wears dentures    full upper and lower    Past Surgical History:  Procedure Laterality Date   APPENDECTOMY     BREAST EXCISIONAL BIOPSY Left 1984   neg   BREAST EXCISIONAL BIOPSY Right 1997   lipoma   CATARACT EXTRACTION W/PHACO Left 09/02/2017    Procedure: CATARACT EXTRACTION PHACO AND INTRAOCULAR LENS PLACEMENT ([place])  LEFT;  Surgeon: Leandrew Koyanagi, MD;  Location: [place];  Service: Ophthalmology;  Laterality: Left;   CATARACT EXTRACTION W/PHACO Right 10/01/2017   Procedure: CATARACT EXTRACTION PHACO AND INTRAOCULAR LENS PLACEMENT ([place]) RIGHT;  Surgeon: Leandrew Koyanagi, MD;  Location: [place];  Service: Ophthalmology;  Laterality: Right;   COLONOSCOPY WITH PROPOFOL N/A 11/27/2016   Procedure: COLONOSCOPY WITH PROPOFOL;  Surgeon: Toledo, Benay Pike, MD;  Location: [facility];  Service: Gastroenterology;  Laterality: N/A;   CORONARY ANGIOPLASTY WITH STENT PLACEMENT     KNEE ARTHROPLASTY Left 01/22/2017   Procedure: COMPUTER ASSISTED TOTAL KNEE ARTHROPLASTY;  Surgeon: Dereck Leep, MD;  Location: [facility];  Service: Orthopedics;  Laterality: Left;   KNEE ARTHROSCOPY Left 02/01/2015   Procedure: ARTHROSCOPY KNEE, partial medial & lateral menisectomy,,medial and patelofemoral chondroplasty;  Surgeon: Dereck Leep, MD;  Location: [facility];  Service: Orthopedics;  Laterality: Left;   LITHOTRIPSY Right 1995   RIGHT AXILLARY LIPOMA REMOVAL     TUBAL LIGATION     VARICOSE VEIN SURGERY      There were no vitals filed for this visit.   Subjective Assessment - 03/29/21 1236     Subjective Reports pollen is bothering  her some today    Pain Score 5     Pain Location Leg    Pain Orientation Right                   ADULT SLP TREATMENT - 03/29/21 1237       General Information   Behavior/Cognition Alert;Cooperative;Pleasant mood    HPI Patient is an 81 y.o. female with past medical history noted for Parkinson's disease (dx 2019), GERD, HTN, CAD, osteoarthritis referred by Dr. Manuella Ghazi for LSVT LOUD evaluation and treatment. Pt known to SLP from prior OP MBS on 04/12/20 with findings of mild oral dysphagia. Regular diet and thin liquids was recommended. Per MD notes pt family noting hoarse, raspy vocal  quality, drooling.      Cognitive-Linquistic Treatment   Treatment focused on Dysarthria;Patient/family/caregiver education    Skilled Treatment Pt completed HEP as assigned. LSVT LOUD Daily tasks: Loud /a/ averaged 80 dB with initial mod cues (fading to occasional min) for shaping loudness, pitch and quality, duration 4.8 seconds.  Max F0 for pitch glides was 308Hz  (avg 78 dB) with usual mod cues for pitch, loudness and breath support, min F0 for low glides was 171 Hz (78 dB), with occasional min-mod cues. Functional phrases averaged 76 dB with rare min cues for loudness. Hierarchical loudness drills at word/phrase level with cognitive load averaged 72 dB. Simple conversation between structured tasks averaged 68 dB with occasional mod cues.      Assessment / Recommendations / Plan   Plan Continue with current plan of care      Progression Toward Goals   Progression toward goals Progressing toward goals              SLP Education - 03/29/21 1241     Education Details appropriate loudness for conversation    Person(s) Educated Patient    Methods Explanation    Comprehension Verbalized understanding              SLP Short Term Goals - 03/22/21 1244       SLP SHORT TERM GOAL #1   Title The patient will complete Daily Tasks (Maximum duration "ah", High/Lows, and Functional Phrases) at average loudness >/= 80 dB and with loud, good quality voice with min cues.    Time 10    Period --   visits   Status New      SLP SHORT TERM GOAL #2   Title The patient will complete Hierarchal Speech Loudness reading drills (words/phrases, sentences) at average >/= 75 dB and with loud, good quality voice with min cues.    Time 10    Period --   visits   Status New      SLP SHORT TERM GOAL #3   Title The patient will participate in 5-8 minutes conversation, maintaining average loudness of 75 dB and good quality voice with modified independence.    Time 10    Period --   visits   Status New               SLP Long Term Goals - 03/22/21 1245       SLP LONG TERM GOAL #1   Title The patient will complete Daily Tasks (Maximum duration "ah", High/Lows, and Functional Phrases) at average loudness >/= 80 dB and with loud, good quality voice.    Time 12    Period Weeks    Status New    Target Date 05/21/21      SLP  LONG TERM GOAL #2   Title The patient will complete Hierarchal Speech Loudness reading drills (words/phrases, sentences, and paragraph) at average >/= 75 dB and with loud, good quality voice.    Time 12    Period Weeks    Status New    Target Date 05/21/21      SLP LONG TERM GOAL #3   Title The patient will participate in 20 minutes conversation, maintaining average loudness of >/= 75 dB and loud, good quality voice.    Time 12    Period Weeks    Status New    Target Date 05/21/21      SLP LONG TERM GOAL #4   Title The patient will report improved communication effectiveness as measured by the Communication Effectiveness Survey.    Time 12    Period Weeks    Status New    Target Date 05/21/21              Plan - 03/29/21 1242     Clinical Impression Statement Patient presents with mild-moderate hypokinetic dysarthria characterized by hoarse, gravelly vocal quality, reduced pitch variation and reduced vocal intensity. Patient reports consistent practice at home and is demonstrating improved ability to complete LSVT daily tasks with reduced levle of cuing. Noted lower pitch, some hoarseness today which is felt to be due to allergies/high pollen count. Able to reduce strain/tension with modeling of breath support. Plan to progress into sentence level tasks next week. Recommend course of LSVT LOUD to maximize pt's vocal quality and intelligibility for conversations with family and friends to reduce social isolation and improve quality of life.    Speech Therapy Frequency 4x / week    Duration 4 weeks    Treatment/Interventions Aspiration precaution  training;Cueing hierarchy;SLP instruction and feedback;Functional tasks;Patient/family education;Other (comment)   LSVT LOUD   Potential to Achieve Goals Good    SLP Home Exercise Plan LSVT LOUD Daily tasks    Consulted and Agree with Plan of Care Patient             Patient will benefit from skilled therapeutic intervention in order to improve the following deficits and impairments:   Dysarthria and anarthria  Parkinson disease ([place])    Problem List Patient Active Problem List   Diagnosis Date Noted   Primary osteoarthritis of hip 02/09/2017   Primary osteoarthritis of knee 02/09/2017   Chronic sinus bradycardia 01/22/2017   Degenerative disc disease, lumbar 01/22/2017   Dizziness 01/22/2017   Nephrolithiasis 01/22/2017   Osteoporosis, 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/22/2017   PVC's (premature ventricular contractions) 01/22/2017   S/P total knee arthroplasty 01/22/2017   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   CKD (chronic kidney disease), 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   GERD (gastroesophageal reflux disease) 06/25/2013   Coronary artery disease involving native coronary artery of native heart 06/24/2013   Essential hypertension 06/21/2013   Mixed hyperlipidemia 06/21/2013   Deneise Lever, MS, CCC-SLP Speech-Language Pathologist [phone]  Aliene Altes, [place] 03/29/2021, 12:44 PM  [place] MAIN [facility] SERVICES [address] [place], [place], [address] Phone: [phone]   Fax:  [phone]   Name: Katelyn Crawford MRN: [id number] Date of Birth: [birth year]

## 2021-04-02 ENCOUNTER — Ambulatory Visit: Payer: Medicare Other | Admitting: Speech Pathology

## 2021-04-02 ENCOUNTER — Other Ambulatory Visit: Payer: Self-pay

## 2021-04-02 DIAGNOSIS — R471 Dysarthria and anarthria: Secondary | ICD-10-CM | POA: Diagnosis not present

## 2021-04-02 DIAGNOSIS — G20A1 Parkinson's disease without dyskinesia, without mention of fluctuations: Secondary | ICD-10-CM

## 2021-04-02 DIAGNOSIS — G20 Parkinson's disease: Secondary | ICD-10-CM

## 2021-04-02 NOTE — Therapy (Signed)
Katelyn Crawford [facility] SERVICES [address] [place], [place], [address] Phone: [phone]   Fax:  [phone]  Speech Language Pathology Treatment  Patient Details  Name: Katelyn Crawford MRN: [id number] Date of Birth: [birth year] Referring Provider (SLP): Dr. Jennings Books   Encounter Date: 04/02/2021   End of Session - 04/02/21 1240     Visit Number 5    Number of Visits 17    Date for SLP Re-Evaluation 05/21/21   extended to allow for scheduling or missed visit make-ups if necessary   Authorization Type Medicare    Authorization - Visit Number 5    Progress Note Due on Visit 10    SLP Start Time 1103    SLP Stop Time  1204    SLP Time Calculation (min) 61 min    Activity Tolerance Patient tolerated treatment well             Past Medical History:  Diagnosis Date   Arthritis    lower back   Cardiomyopathy ([place])    with diastolic dysfunction   Chronic sinus bradycardia    Coronary artery disease 06/24/2013   1. 03/10/12:Diffuse 90% stenosis mid right coronary artery treated medically because of inability to cannilate the vessel during cardiac catherization 03/10/2012   Dizziness    chronic   Environmental allergies    w/ allergic rhinitis   GERD (gastroesophageal reflux disease) 06/25/2013   H/O vertigo    Heart disease    History of degenerative disc disease    History of diverticulosis    History of kidney stones    History of seasonal allergies    Hyperlipidemia, unspecified    Hypertension    Nephrolithiasis    Non-cardiac chest pain    Osteoporosis, post-menopausal    Parkinson disease ([place])    PVC's (premature ventricular contractions)    Vascular disease    Wears dentures    full upper and lower    Past Surgical History:  Procedure Laterality Date   APPENDECTOMY     BREAST EXCISIONAL BIOPSY Left 1984   neg   BREAST EXCISIONAL BIOPSY Right 1997   lipoma   CATARACT EXTRACTION W/PHACO Left 09/02/2017    Procedure: CATARACT EXTRACTION PHACO AND INTRAOCULAR LENS PLACEMENT ([place])  LEFT;  Surgeon: Leandrew Koyanagi, MD;  Location: [facility];  Service: Ophthalmology;  Laterality: Left;   CATARACT EXTRACTION W/PHACO Right 10/01/2017   Procedure: CATARACT EXTRACTION PHACO AND INTRAOCULAR LENS PLACEMENT ([place]) RIGHT;  Surgeon: Leandrew Koyanagi, MD;  Location: [facility];  Service: Ophthalmology;  Laterality: Right;   COLONOSCOPY WITH PROPOFOL N/A 11/27/2016   Procedure: COLONOSCOPY WITH PROPOFOL;  Surgeon: Toledo, Benay Pike, MD;  Location: [facility];  Service: Gastroenterology;  Laterality: N/A;   CORONARY ANGIOPLASTY WITH STENT PLACEMENT     KNEE ARTHROPLASTY Left 01/22/2017   Procedure: COMPUTER ASSISTED TOTAL KNEE ARTHROPLASTY;  Surgeon: Dereck Leep, MD;  Location: [facility];  Service: Orthopedics;  Laterality: Left;   KNEE ARTHROSCOPY Left 02/01/2015   Procedure: ARTHROSCOPY KNEE, partial medial & lateral menisectomy,,medial and patelofemoral chondroplasty;  Surgeon: Dereck Leep, MD;  Location: [facility];  Service: Orthopedics;  Laterality: Left;   LITHOTRIPSY Right 1995   RIGHT AXILLARY LIPOMA REMOVAL     TUBAL LIGATION     VARICOSE VEIN SURGERY      There were no vitals filed for this visit.   Subjective Assessment - 04/02/21 1234     Subjective Pt returns with documentation  of all weekend practice sessions    Currently in Pain? Yes    Pain Score 5     Pain Location Knee    Pain Orientation Right                   ADULT SLP TREATMENT - 04/02/21 1234       General Information   Behavior/Cognition Alert;Cooperative;Pleasant mood    HPI Patient is an 81 y.o. female with past medical history noted for Parkinson's disease (dx 2019), GERD, HTN, CAD, osteoarthritis referred by Dr. Manuella Ghazi for LSVT LOUD evaluation and treatment. Pt known to SLP from prior OP MBS on 04/12/20 with findings of mild oral dysphagia. Regular diet and thin liquids was recommended.  Per MD notes pt family noting hoarse, raspy vocal quality, drooling.      Cognitive-Linquistic Treatment   Treatment focused on Dysarthria;Patient/family/caregiver education    Skilled Treatment Pt had question about number of times she should repeat her functional phrases at home; clarified that it would be once for days when she comes to therapy, and twice on days she does not come to therapy. Wrote reminder on her homework sheet for today. LSVT LOUD Daily tasks: Loud /a/ averaged 82 dB with initial mod cues (fading to occasional min) for shaping loudness, pitch and quality, duration 4.4 seconds.  Max F0 for pitch glides was 329Hz  (avg 82 dB) with occasional min-mod cues for pitch, loudness and breath support, min F0 for low glides was 165 Hz (81 dB), with rare min cues. Functional phrases averaged 77 dB with rare min cues for loudness. Hierarchical loudness drills at sentence level with cognitive load averaged 75 dB. Simple conversation between structured tasks averaged 73 dB with occasional min cues; patient noted to make several self-corrections in loudness today.      Assessment / Recommendations / Plan   Plan Continue with current plan of care      Progression Toward Goals   Progression toward goals Progressing toward goals              SLP Education - 04/02/21 1240     Education Details when pt using her LOUD voice she is at normal conversational level    Person(s) Educated Patient    Methods Explanation;Other (comment)   auditory feedback   Comprehension Verbalized understanding              SLP Short Term Goals - 03/22/21 1244       SLP SHORT TERM GOAL #1   Title The patient will complete Daily Tasks (Maximum duration "ah", High/Lows, and Functional Phrases) at average loudness >/= 80 dB and with loud, good quality voice with min cues.    Time 10    Period --   visits   Status New      SLP SHORT TERM GOAL #2   Title The patient will complete Hierarchal Speech  Loudness reading drills (words/phrases, sentences) at average >/= 75 dB and with loud, good quality voice with min cues.    Time 10    Period --   visits   Status New      SLP SHORT TERM GOAL #3   Title The patient will participate in 5-8 minutes conversation, maintaining average loudness of 75 dB and good quality voice with modified independence.    Time 10    Period --   visits   Status New              SLP Long  Term Goals - 03/22/21 1245       SLP LONG TERM GOAL #1   Title The patient will complete Daily Tasks (Maximum duration "ah", High/Lows, and Functional Phrases) at average loudness >/= 80 dB and with loud, good quality voice.    Time 12    Period Weeks    Status New    Target Date 05/21/21      SLP LONG TERM GOAL #2   Title The patient will complete Hierarchal Speech Loudness reading drills (words/phrases, sentences, and paragraph) at average >/= 75 dB and with loud, good quality voice.    Time 12    Period Weeks    Status New    Target Date 05/21/21      SLP LONG TERM GOAL #3   Title The patient will participate in 20 minutes conversation, maintaining average loudness of >/= 75 dB and loud, good quality voice.    Time 12    Period Weeks    Status New    Target Date 05/21/21      SLP LONG TERM GOAL #4   Title The patient will report improved communication effectiveness as measured by the Communication Effectiveness Survey.    Time 12    Period Weeks    Status New    Target Date 05/21/21              Plan - 04/02/21 1242     Clinical Impression Statement Patient presents with mild-moderate hypokinetic dysarthria characterized by hoarse, gravelly vocal quality, reduced pitch variation and reduced vocal intensity. With consistent practice at home, patient maintained and improved gains made during the first week of therapy. Continues to improve loudness, vocal quality and pitch range in structured tasks, now progressing to sentence level and carryover for  brief conversations. Demonstrated improved awareness of need to use LOUD voice. Continue LSVT LOUD to maximize pt's vocal quality and intelligibility for conversations with family and friends to reduce social isolation and improve quality of life.    Speech Therapy Frequency 4x / week    Duration 4 weeks    Treatment/Interventions Aspiration precaution training;Cueing hierarchy;SLP instruction and feedback;Functional tasks;Patient/family education;Other (comment)   LSVT LOUD   Potential to Achieve Goals Good    SLP Home Exercise Plan LSVT LOUD Daily tasks    Consulted and Agree with Plan of Care Patient             Patient will benefit from skilled therapeutic intervention in order to improve the following deficits and impairments:   Dysarthria and anarthria  Parkinson disease ([place])    Problem List Patient Active Problem List   Diagnosis Date Noted   Primary osteoarthritis of hip 02/09/2017   Primary osteoarthritis of knee 02/09/2017   Chronic sinus bradycardia 01/22/2017   Degenerative disc disease, lumbar 01/22/2017   Dizziness 01/22/2017   Nephrolithiasis 01/22/2017   Osteoporosis, 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/22/2017   PVC's (premature ventricular contractions) 01/22/2017   S/P total knee arthroplasty 01/22/2017   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 [place], [place], [address] Phone: [phone]   Fax:  [phone]   Name:  Katelyn Crawford MRN: [id number] Date of Birth: [birth year]

## 2021-04-03 ENCOUNTER — Ambulatory Visit: Payer: Medicare Other | Admitting: Speech Pathology

## 2021-04-03 DIAGNOSIS — G20 Parkinson's disease: Secondary | ICD-10-CM

## 2021-04-03 DIAGNOSIS — R471 Dysarthria and anarthria: Secondary | ICD-10-CM

## 2021-04-03 DIAGNOSIS — G20A1 Parkinson's disease without dyskinesia, without mention of fluctuations: Secondary | ICD-10-CM

## 2021-04-03 NOTE — Therapy (Signed)
[place] ?[facility] ?[address] ?[place], [place], [address] ?Phone: [phone]   Fax:  [phone] ? ?Speech Language Pathology Treatment ? ?Patient Details  ?Name: Katelyn Crawford ?MRN: [id number] ?Date of Birth: 1940-12-07 ?Referring Provider (SLP): Dr. Cristopher Peru ? ? ?Encounter Date: 04/03/2021 ? ? End of Session - 04/03/21 1207   ? ? Visit Number 6   ? Number of Visits 17   ? Date for SLP Re-Evaluation 05/21/21   extended to allow for scheduling or missed visit make-ups if necessary  ? Authorization Type Medicare   ? Authorization - Visit Number 6   ? Progress Note Due on Visit 10   ? SLP Start Time 1055   ? SLP Stop Time  1155   ? SLP Time Calculation (min) 60 min   ? Activity Tolerance Patient tolerated treatment well   ? ?  ?  ? ?  ? ? ?Past Medical History:  ?Diagnosis Date  ? Arthritis   ? lower back  ? Cardiomyopathy ([facility])   ? with diastolic dysfunction  ? Chronic sinus bradycardia   ? Coronary artery disease 06/24/2013  ? 1. 03/10/12:Diffuse 90% stenosis mid right coronary artery treated medically because of inability to cannilate the vessel during cardiac catherization 03/10/2012  ? Dizziness   ? chronic  ? Environmental allergies   ? w/ allergic rhinitis  ? GERD (gastroesophageal reflux disease) 06/25/2013  ? H/O vertigo   ? Heart disease   ? History of degenerative disc disease   ? History of diverticulosis   ? History of kidney stones   ? History of seasonal allergies   ? Hyperlipidemia, unspecified   ? Hypertension   ? Nephrolithiasis   ? Non-cardiac chest pain   ? Osteoporosis, post-menopausal   ? Parkinson disease ([facility])   ? PVC's (premature ventricular contractions)   ? Vascular disease   ? Wears dentures   ? full upper and lower  ? ? ?Past Surgical History:  ?Procedure Laterality Date  ? APPENDECTOMY    ? BREAST EXCISIONAL BIOPSY Left 1984  ? neg  ? BREAST EXCISIONAL BIOPSY Right 1997  ? lipoma  ? CATARACT EXTRACTION W/PHACO Left 09/02/2017  ?  Procedure: CATARACT EXTRACTION PHACO AND INTRAOCULAR LENS PLACEMENT ([facility])  LEFT;  Surgeon: Lockie Mola, MD;  Location: [facility];  Service: Ophthalmology;  Laterality: Left;  ? CATARACT EXTRACTION W/PHACO Right 10/01/2017  ? Procedure: CATARACT EXTRACTION PHACO AND INTRAOCULAR LENS PLACEMENT ([facility]) RIGHT;  Surgeon: Lockie Mola, MD;  Location: [facility];  Service: Ophthalmology;  Laterality: Right;  ? COLONOSCOPY WITH PROPOFOL N/A 11/27/2016  ? Procedure: COLONOSCOPY WITH PROPOFOL;  Surgeon: Toledo, Boykin Nearing, MD;  Location: [facility];  Service: Gastroenterology;  Laterality: N/A;  ? CORONARY ANGIOPLASTY WITH STENT PLACEMENT    ? KNEE ARTHROPLASTY Left 01/22/2017  ? Procedure: COMPUTER ASSISTED TOTAL KNEE ARTHROPLASTY;  Surgeon: Donato Heinz, MD;  Location: [facility];  Service: Orthopedics;  Laterality: Left;  ? KNEE ARTHROSCOPY Left 02/01/2015  ? Procedure: ARTHROSCOPY KNEE, partial medial & lateral menisectomy,,medial and patelofemoral chondroplasty;  Surgeon: Donato Heinz, MD;  Location: [facility];  Service: Orthopedics;  Laterality: Left;  ? LITHOTRIPSY Right 1995  ? RIGHT AXILLARY LIPOMA REMOVAL    ? TUBAL LIGATION    ? VARICOSE VEIN SURGERY    ? ? ?There were no vitals filed for this visit. ? ? Subjective Assessment - 04/03/21 1202   ? ? Subjective Had Baker's cyst drained  this am   ? Currently in Pain? No/denies   ? ?  ?  ? ?  ? ? ? ? ? ? ? ? ADULT SLP TREATMENT - 04/03/21 1202   ? ?  ? General Information  ? Behavior/Cognition Alert;Cooperative;Pleasant mood   ? HPI Patient is an 81 y.o. female with past medical history noted for Parkinson's disease (dx 2019), GERD, HTN, CAD, osteoarthritis referred by Dr. Sherryll Burger for LSVT LOUD evaluation and treatment. Pt known to SLP from prior OP MBS on 04/12/20 with findings of mild oral dysphagia. Regular diet and thin liquids was recommended. Per MD notes pt family noting hoarse, raspy vocal quality, drooling.   ?  ?  Cognitive-Linquistic Treatment  ? Treatment focused on Dysarthria;Patient/family/caregiver education   ? Skilled Treatment LSVT LOUD Daily tasks: Loud /a/ averaged 82 dB with occasional min-mod cues for shaping loudness, pitch and quality, duration 4.7 seconds.  Max F0 for pitch glides was 326Hz  (avg 82 dB) with occasional min-mod cues for vocal quality, loudness, and pitch, and min F0 for low glides was 171 Hz (81 dB), with rare min cues. Functional phrases averaged 76 dB with modified independence. Hierarchical loudness drills at 1-2 sentence level with cognitive load averaged 74 dB. Conversation 5-8 minutes between structured tasks averaged 71 dB with occasional min cues; patient noted to make several self-corrections in loudness today.   ?  ? Assessment / Recommendations / Plan  ? Plan Continue with current plan of care   ?  ? Progression Toward Goals  ? Progression toward goals Progressing toward goals   ? ?  ?  ? ?  ? ? ? SLP Education - 04/03/21 1206   ? ? Education Details breath support for power to be loud   ? Person(s) Educated Patient   ? Methods Explanation;Verbal cues   ? Comprehension Verbalized understanding   ? ?  ?  ? ?  ? ? ? SLP Short Term Goals - 03/22/21 1244   ? ?  ? SLP SHORT TERM GOAL #1  ? Title The patient will complete Daily Tasks (Maximum duration "ah", High/Lows, and Functional Phrases) at average loudness >/= 80 dB and with loud, good quality voice with min cues.   ? Time 10   ? Period --   visits  ? Status New   ?  ? SLP SHORT TERM GOAL #2  ? Title The patient will complete Hierarchal Speech Loudness reading drills (words/phrases, sentences) at average >/= 75 dB and with loud, good quality voice with min cues.   ? Time 10   ? Period --   visits  ? Status New   ?  ? SLP SHORT TERM GOAL #3  ? Title The patient will participate in 5-8 minutes conversation, maintaining average loudness of 75 dB and good quality voice with modified independence.   ? Time 10   ? Period --   visits  ? Status  New   ? ?  ?  ? ?  ? ? ? SLP Long Term Goals - 03/22/21 1245   ? ?  ? SLP LONG TERM GOAL #1  ? Title The patient will complete Daily Tasks (Maximum duration "ah", High/Lows, and Functional Phrases) at average loudness >/= 80 dB and with loud, good quality voice.   ? Time 12   ? Period Weeks   ? Status New   ? Target Date 05/21/21   ?  ? SLP LONG TERM GOAL #2  ? Title The patient  will complete Hierarchal Speech Loudness reading drills (words/phrases, sentences, and paragraph) at average >/= 75 dB and with loud, good quality voice.   ? Time 12   ? Period Weeks   ? Status New   ? Target Date 05/21/21   ?  ? SLP LONG TERM GOAL #3  ? Title The patient will participate in 20 minutes conversation, maintaining average loudness of >/= 75 dB and loud, good quality voice.   ? Time 12   ? Period Weeks   ? Status New   ? Target Date 05/21/21   ?  ? SLP LONG TERM GOAL #4  ? Title The patient will report improved communication effectiveness as measured by the Communication Effectiveness Survey.   ? Time 12   ? Period Weeks   ? Status New   ? Target Date 05/21/21   ? ?  ?  ? ?  ? ? ? Plan - 04/03/21 1207   ? ? Clinical Impression Statement Patient presents with mild-moderate hypokinetic dysarthria characterized by hoarse, gravelly vocal quality, reduced pitch variation and reduced vocal intensity. Continues to improve loudness, vocal quality and pitch range in structured tasks, now progressing to sentence level and carryover for conversations of 5-8 minutes. Demonstrates improved awareness of need to use LOUD voice. Continue LSVT LOUD to maximize pt's vocal quality and intelligibility for conversations with family and friends to reduce social isolation and improve quality of life.   ? Speech Therapy Frequency 4x / week   ? Duration 4 weeks   ? Treatment/Interventions Aspiration precaution training;Cueing hierarchy;SLP instruction and feedback;Functional tasks;Patient/family education;Other (comment)   LSVT LOUD  ? Potential to  Achieve Goals Good   ? SLP Home Exercise Plan LSVT LOUD Daily tasks   ? Consulted and Agree with Plan of Care Patient   ? ?  ?  ? ?  ? ? ?Patient will benefit from skilled therapeutic intervention in order to improve the followi

## 2021-04-04 ENCOUNTER — Other Ambulatory Visit: Payer: Self-pay

## 2021-04-04 ENCOUNTER — Ambulatory Visit: Payer: Medicare Other | Admitting: Speech Pathology

## 2021-04-04 DIAGNOSIS — G20 Parkinson's disease: Secondary | ICD-10-CM

## 2021-04-04 DIAGNOSIS — R471 Dysarthria and anarthria: Secondary | ICD-10-CM

## 2021-04-04 NOTE — Therapy (Signed)
[place] ?[facility] ?[address] ?[place], [place], [address] ?Phone: [phone]   Fax:  [phone] ? ?Speech Language Pathology Treatment ? ?Patient Details  ?Name: Katelyn Crawford ?MRN: [id number] ?Date of Birth: [birth year] ?Referring Provider (SLP): Dr. Cristopher Peru ? ? ?Encounter Date: 04/04/2021 ? ? End of Session - 04/04/21 1322   ? ? Visit Number 7   ? Number of Visits 17   ? Date for SLP Re-Evaluation 05/21/21   extended to allow for scheduling or missed visit make-ups if necessary  ? Authorization Type Medicare   ? Authorization - Visit Number 7   ? Progress Note Due on Visit 10   ? SLP Start Time 1100   ? SLP Stop Time  1200   ? SLP Time Calculation (min) 60 min   ? Activity Tolerance Patient tolerated treatment well   ? ?  ?  ? ?  ? ? ?Past Medical History:  ?Diagnosis Date  ? Arthritis   ? lower back  ? Cardiomyopathy (HCC)   ? with diastolic dysfunction  ? Chronic sinus bradycardia   ? Coronary artery disease 06/24/2013  ? 1. 03/10/12:Diffuse 90% stenosis mid right coronary artery treated medically because of inability to cannilate the vessel during cardiac catherization 03/10/2012  ? Dizziness   ? chronic  ? Environmental allergies   ? w/ allergic rhinitis  ? GERD (gastroesophageal reflux disease) 06/25/2013  ? H/O vertigo   ? Heart disease   ? History of degenerative disc disease   ? History of diverticulosis   ? History of kidney stones   ? History of seasonal allergies   ? Hyperlipidemia, unspecified   ? Hypertension   ? Nephrolithiasis   ? Non-cardiac chest pain   ? Osteoporosis, post-menopausal   ? Parkinson disease (HCC)   ? PVC's (premature ventricular contractions)   ? Vascular disease   ? Wears dentures   ? full upper and lower  ? ? ?Past Surgical History:  ?Procedure Laterality Date  ? APPENDECTOMY    ? BREAST EXCISIONAL BIOPSY Left 1984  ? neg  ? BREAST EXCISIONAL BIOPSY Right 1997  ? lipoma  ? CATARACT EXTRACTION W/PHACO Left 09/02/2017  ?  Procedure: CATARACT EXTRACTION PHACO AND INTRAOCULAR LENS PLACEMENT (IOC)  LEFT;  Surgeon: Lockie Mola, MD;  Location: [facility];  Service: Ophthalmology;  Laterality: Left;  ? CATARACT EXTRACTION W/PHACO Right 10/01/2017  ? Procedure: CATARACT EXTRACTION PHACO AND INTRAOCULAR LENS PLACEMENT (IOC) RIGHT;  Surgeon: Lockie Mola, MD;  Location: [facility];  Service: Ophthalmology;  Laterality: Right;  ? COLONOSCOPY WITH PROPOFOL N/A 11/27/2016  ? Procedure: COLONOSCOPY WITH PROPOFOL;  Surgeon: Toledo, Boykin Nearing, MD;  Location: [facility];  Service: Gastroenterology;  Laterality: N/A;  ? CORONARY ANGIOPLASTY WITH STENT PLACEMENT    ? KNEE ARTHROPLASTY Left 01/22/2017  ? Procedure: COMPUTER ASSISTED TOTAL KNEE ARTHROPLASTY;  Surgeon: Donato Heinz, MD;  Location: [facility];  Service: Orthopedics;  Laterality: Left;  ? KNEE ARTHROSCOPY Left 02/01/2015  ? Procedure: ARTHROSCOPY KNEE, partial medial & lateral menisectomy,,medial and patelofemoral chondroplasty;  Surgeon: Donato Heinz, MD;  Location: [facility];  Service: Orthopedics;  Laterality: Left;  ? LITHOTRIPSY Right 1995  ? RIGHT AXILLARY LIPOMA REMOVAL    ? TUBAL LIGATION    ? VARICOSE VEIN SURGERY    ? ? ?There were no vitals filed for this visit. ? ? Subjective Assessment - 04/04/21 1317   ? ? Subjective Clearing throat, hoarse vocal  quality today   ? Currently in Pain? No/denies   ? ?  ?  ? ?  ? ? ? ? ? ? ? ? ADULT SLP TREATMENT - 04/04/21 1318   ? ?  ? General Information  ? Behavior/Cognition Alert;Cooperative;Pleasant mood   ? HPI Patient is an 81 y.o. female with past medical history noted for Parkinson's disease (dx 2019), GERD, HTN, CAD, osteoarthritis referred by Dr. Sherryll Burger for LSVT LOUD evaluation and treatment. Pt known to SLP from prior OP MBS on 04/12/20 with findings of mild oral dysphagia. Regular diet and thin liquids was recommended. Per MD notes pt family noting hoarse, raspy vocal quality, drooling.   ?  ?  Cognitive-Linquistic Treatment  ? Treatment focused on Dysarthria;Patient/family/caregiver education   ? Skilled Treatment LSVT LOUD Daily tasks: Loud /a/ averaged 81 dB with usual mod-max cues for shaping quality, duration 4.3 seconds.  Max F0 for pitch glides was 323Hz  (avg 78 dB) with usual mod-max cues for vocal quality, and min F0 for low glides was 183 Hz with max cues; tax was modified due to pt struggling with strain/vocal quality today despite modeling and shaping. Functional phrases averaged 73 dB with initial mod-max cues, fading to occasional min A. Reduced complexity of hierarchical loudness drills to word/phrase level with average of 75 dB and occasional mod cues.   ?  ? Assessment / Recommendations / Plan  ? Plan Continue with current plan of care   ?  ? Progression Toward Goals  ? Progression toward goals Progressing toward goals   ? ?  ?  ? ?  ? ? ? SLP Education - 04/04/21 1322   ? ? Education Details no practice if strain or pain   ? Person(s) Educated Patient   ? Methods Explanation   ? Comprehension Verbalized understanding   ? ?  ?  ? ?  ? ? ? SLP Short Term Goals - 03/22/21 1244   ? ?  ? SLP SHORT TERM GOAL #1  ? Title The patient will complete Daily Tasks (Maximum duration "ah", High/Lows, and Functional Phrases) at average loudness >/= 80 dB and with loud, good quality voice with min cues.   ? Time 10   ? Period --   visits  ? Status New   ?  ? SLP SHORT TERM GOAL #2  ? Title The patient will complete Hierarchal Speech Loudness reading drills (words/phrases, sentences) at average >/= 75 dB and with loud, good quality voice with min cues.   ? Time 10   ? Period --   visits  ? Status New   ?  ? SLP SHORT TERM GOAL #3  ? Title The patient will participate in 5-8 minutes conversation, maintaining average loudness of 75 dB and good quality voice with modified independence.   ? Time 10   ? Period --   visits  ? Status New   ? ?  ?  ? ?  ? ? ? SLP Long Term Goals - 03/22/21 1245   ? ?  ? SLP LONG  TERM GOAL #1  ? Title The patient will complete Daily Tasks (Maximum duration "ah", High/Lows, and Functional Phrases) at average loudness >/= 80 dB and with loud, good quality voice.   ? Time 12   ? Period Weeks   ? Status New   ? Target Date 05/21/21   ?  ? SLP LONG TERM GOAL #2  ? Title The patient will complete Hierarchal Speech Loudness reading drills (  words/phrases, sentences, and paragraph) at average >/= 75 dB and with loud, good quality voice.   ? Time 12   ? Period Weeks   ? Status New   ? Target Date 05/21/21   ?  ? SLP LONG TERM GOAL #3  ? Title The patient will participate in 20 minutes conversation, maintaining average loudness of >/= 75 dB and loud, good quality voice.   ? Time 12   ? Period Weeks   ? Status New   ? Target Date 05/21/21   ?  ? SLP LONG TERM GOAL #4  ? Title The patient will report improved communication effectiveness as measured by the Communication Effectiveness Survey.   ? Time 12   ? Period Weeks   ? Status New   ? Target Date 05/21/21   ? ?  ?  ? ?  ? ? ? Plan - 04/04/21 1323   ? ? Clinical Impression Statement Patient presents with mild-moderate hypokinetic dysarthria characterized by hoarse, gravelly vocal quality, reduced pitch variation and reduced vocal intensity. Session/heirarchy tasks modified today due to hoarse vocal quality, strain, and increased need for cuing in daily tasks. Pt reports increased phlegm and was clearing her throat throughout session today despite cues. Question over-use (several phone calls last night and this morning) vs seasonal allergies/illness. Encouraged vocal rest this afternoon vs practicing. Continue LSVT LOUD to maximize pt's vocal quality and intelligibility for conversations with family and friends to reduce social isolation and improve quality of life.   ? Speech Therapy Frequency 4x / week   ? Duration 4 weeks   ? Treatment/Interventions Aspiration precaution training;Cueing hierarchy;SLP instruction and feedback;Functional  tasks;Patient/family education;Other (comment)   LSVT LOUD  ? Potential to Achieve Goals Good   ? SLP Home Exercise Plan LSVT LOUD Daily tasks   ? Consulted and Agree with Plan of Care Patient   ? ?  ?  ? ?  ? ? ?Patient will ben

## 2021-04-05 ENCOUNTER — Ambulatory Visit: Payer: Medicare Other | Admitting: Speech Pathology

## 2021-04-05 DIAGNOSIS — R471 Dysarthria and anarthria: Secondary | ICD-10-CM | POA: Diagnosis not present

## 2021-04-05 NOTE — Therapy (Signed)
[place] ?[facility] ?[place], [place], [address] ?Phone: [phone]   Fax:  [phone] ? ?Speech Language Pathology Treatment ? ?Patient Details  ?Name: Katelyn Crawford ?MRN: [id number] ?Date of Birth: [birth year] ?Referring Provider (SLP): Dr. Jennings Books ? ? ?Encounter Date: 04/05/2021 ? ? End of Session - 04/05/21 1318   ? ? Visit Number 8   ? Number of Visits 17   ? Date for SLP Re-Evaluation 05/21/21   extended to allow for scheduling or missed visit make-ups if necessary  ? Authorization Type Medicare   ? Authorization - Visit Number 8   ? Progress Note Due on Visit 10   ? SLP Start Time 1105   ? SLP Stop Time  1205   ? SLP Time Calculation (min) 60 min   ? Activity Tolerance Patient tolerated treatment well   ? ?  ?  ? ?  ? ? ?Past Medical History:  ?Diagnosis Date  ? Arthritis   ? lower back  ? Cardiomyopathy ([place])   ? with diastolic dysfunction  ? Chronic sinus bradycardia   ? Coronary artery disease 06/24/2013  ? 1. 03/10/12:Diffuse 90% stenosis mid right coronary artery treated medically because of inability to cannilate the vessel during cardiac catherization 03/10/2012  ? Dizziness   ? chronic  ? Environmental allergies   ? w/ allergic rhinitis  ? GERD (gastroesophageal reflux disease) 06/25/2013  ? H/O vertigo   ? Heart disease   ? History of degenerative disc disease   ? History of diverticulosis   ? History of kidney stones   ? History of seasonal allergies   ? Hyperlipidemia, unspecified   ? Hypertension   ? Nephrolithiasis   ? Non-cardiac chest pain   ? Osteoporosis, post-menopausal   ? Parkinson disease ([place])   ? PVC's (premature ventricular contractions)   ? Vascular disease   ? Wears dentures   ? full upper and lower  ? ? ?Past Surgical History:  ?Procedure Laterality Date  ? APPENDECTOMY    ? BREAST EXCISIONAL BIOPSY Left 1984  ? neg  ? BREAST EXCISIONAL BIOPSY Right 1997  ? lipoma  ? CATARACT EXTRACTION W/PHACO Left 09/02/2017  ?  Procedure: CATARACT EXTRACTION PHACO AND INTRAOCULAR LENS PLACEMENT ([place])  LEFT;  Surgeon: Leandrew Koyanagi, MD;  Location: [place];  Service: Ophthalmology;  Laterality: Left;  ? CATARACT EXTRACTION W/PHACO Right 10/01/2017  ? Procedure: CATARACT EXTRACTION PHACO AND INTRAOCULAR LENS PLACEMENT ([place]) RIGHT;  Surgeon: Leandrew Koyanagi, MD;  Location: [place];  Service: Ophthalmology;  Laterality: Right;  ? COLONOSCOPY WITH PROPOFOL N/A 11/27/2016  ? Procedure: COLONOSCOPY WITH PROPOFOL;  Surgeon: Toledo, Benay Pike, MD;  Location: [facility];  Service: Gastroenterology;  Laterality: N/A;  ? CORONARY ANGIOPLASTY WITH STENT PLACEMENT    ? KNEE ARTHROPLASTY Left 01/22/2017  ? Procedure: COMPUTER ASSISTED TOTAL KNEE ARTHROPLASTY;  Surgeon: Dereck Leep, MD;  Location: [facility];  Service: Orthopedics;  Laterality: Left;  ? KNEE ARTHROSCOPY Left 02/01/2015  ? Procedure: ARTHROSCOPY KNEE, partial medial & lateral menisectomy,,medial and patelofemoral chondroplasty;  Surgeon: Dereck Leep, MD;  Location: [facility];  Service: Orthopedics;  Laterality: Left;  ? LITHOTRIPSY Right 1995  ? RIGHT AXILLARY LIPOMA REMOVAL    ? TUBAL LIGATION    ? VARICOSE VEIN SURGERY    ? ? ?There were no vitals filed for this visit. ? ? Subjective Assessment - 04/05/21 1105   ? ? Subjective "She noticed a difference."   ?  Pain Score 2    ? Pain Location Knee   ? Pain Orientation Right   ? ?  ?  ? ?  ? ? ? ? ? ? ? ? ADULT SLP TREATMENT - 04/05/21 1131   ? ?  ? General Information  ? Behavior/Cognition Alert;Cooperative;Pleasant mood   ? HPI Patient is an 81 y.o. female with past medical history noted for Parkinson's disease (dx 2019), GERD, HTN, CAD, osteoarthritis referred by Dr. Manuella Ghazi for LSVT LOUD evaluation and treatment. Pt known to SLP from prior OP MBS on 04/12/20 with findings of mild oral dysphagia. Regular diet and thin liquids was recommended. Per MD notes pt family noting hoarse, raspy vocal quality,  drooling.   ?  ? Cognitive-Linquistic Treatment  ? Treatment focused on Dysarthria;Patient/family/caregiver education   ? Skilled Treatment LSVT LOUD Daily tasks: Loud /a/ averaged 80 dB with occasional min-mod cues for shaping loudness, pitch and quality, duration 3.8 seconds.  Max F0 for pitch glides was 316Hz  (avg 81 dB) with occasional min-mod cues for vocal quality and pitch, and min F0 for low glides was 175 Hz (80 dB), with occasional min cues (pitch, loudness). Functional phrases averaged 76 dB with modified independence. Hierarchical loudness drills at 1-2 sentence level with cognitive load averaged 75 dB. Conversation 5-8 minutes between structured tasks averaged 71 dB with occasional min cues necessary when vocal intensity dropped.   ?  ? Assessment / Recommendations / Plan  ? Plan Continue with current plan of care   ?  ? Progression Toward Goals  ? Progression toward goals Progressing toward goals   ? ?  ?  ? ?  ? ? ? SLP Education - 04/05/21 1318   ? ? Education Details Loud voice is WNL   ? Person(s) Educated Patient   ? Methods Explanation   ? Comprehension Verbalized understanding   ? ?  ?  ? ?  ? ? ? SLP Short Term Goals - 03/22/21 1244   ? ?  ? SLP SHORT TERM GOAL #1  ? Title The patient will complete Daily Tasks (Maximum duration "ah", High/Lows, and Functional Phrases) at average loudness >/= 80 dB and with loud, good quality voice with min cues.   ? Time 10   ? Period --   visits  ? Status New   ?  ? SLP SHORT TERM GOAL #2  ? Title The patient will complete Hierarchal Speech Loudness reading drills (words/phrases, sentences) at average >/= 75 dB and with loud, good quality voice with min cues.   ? Time 10   ? Period --   visits  ? Status New   ?  ? SLP SHORT TERM GOAL #3  ? Title The patient will participate in 5-8 minutes conversation, maintaining average loudness of 75 dB and good quality voice with modified independence.   ? Time 10   ? Period --   visits  ? Status New   ? ?  ?  ? ?  ? ? ?  SLP Long Term Goals - 03/22/21 1245   ? ?  ? SLP LONG TERM GOAL #1  ? Title The patient will complete Daily Tasks (Maximum duration "ah", High/Lows, and Functional Phrases) at average loudness >/= 80 dB and with loud, good quality voice.   ? Time 12   ? Period Weeks   ? Status New   ? Target Date 05/21/21   ?  ? SLP LONG TERM GOAL #2  ? Title The patient  will complete Hierarchal Speech Loudness reading drills (words/phrases, sentences, and paragraph) at average >/= 75 dB and with loud, good quality voice.   ? Time 12   ? Period Weeks   ? Status New   ? Target Date 05/21/21   ?  ? SLP LONG TERM GOAL #3  ? Title The patient will participate in 20 minutes conversation, maintaining average loudness of >/= 75 dB and loud, good quality voice.   ? Time 12   ? Period Weeks   ? Status New   ? Target Date 05/21/21   ?  ? SLP LONG TERM GOAL #4  ? Title The patient will report improved communication effectiveness as measured by the Communication Effectiveness Survey.   ? Time 12   ? Period Weeks   ? Status New   ? Target Date 05/21/21   ? ?  ?  ? ?  ? ? ? Plan - 04/05/21 1321   ? ? Clinical Impression Statement Patient presents with mild-moderate hypokinetic dysarthria characterized by hoarse, gravelly vocal quality, reduced pitch variation and reduced vocal intensity. Improvement in vocal quality today without significant strain noted; reduced duration of loud /a/ to prevent strain. Pt to resume practicing however SLP encouraged pt to skip tasks or reattempt at a later time if she is noticing strain. Modeling vs cuing verbally for breath support is most effective strategy. Continue LSVT LOUD to maximize pt's vocal quality and intelligibility for conversations with family and friends to reduce social isolation and improve quality of life.   ? Speech Therapy Frequency 4x / week   ? Duration 4 weeks   ? Treatment/Interventions Aspiration precaution training;Cueing hierarchy;SLP instruction and feedback;Functional  tasks;Patient/family education;Other (comment)   LSVT LOUD  ? Potential to Achieve Goals Good   ? SLP Home Exercise Plan LSVT LOUD Daily tasks   ? Consulted and Agree with Plan of Care Patient   ? ?  ?  ? ?  ? ? ?Patient will ben

## 2021-04-09 ENCOUNTER — Ambulatory Visit: Payer: Medicare Other | Admitting: Speech Pathology

## 2021-04-10 ENCOUNTER — Other Ambulatory Visit: Payer: Self-pay

## 2021-04-10 ENCOUNTER — Ambulatory Visit: Payer: Medicare Other | Admitting: Speech Pathology

## 2021-04-10 DIAGNOSIS — G20 Parkinson's disease: Secondary | ICD-10-CM

## 2021-04-10 DIAGNOSIS — R471 Dysarthria and anarthria: Secondary | ICD-10-CM

## 2021-04-10 NOTE — Therapy (Signed)
?[facility] ?[place], [place], [address] ?Phone: [phone]   Fax:  [phone] ? ?Speech Language Pathology Treatment ? ?Patient Details  ?Name: Katelyn Crawford ?MRN: [id number] ?Date of Birth: [birth year] ?Referring Provider (SLP): Dr. Jennings Books ? ? ?Encounter Date: 04/10/2021 ? ? End of Session - 04/10/21 1328   ? ? Visit Number 9   ? Number of Visits 17   ? Date for SLP Re-Evaluation 05/21/21   extended to allow for scheduling or missed visit make-ups if necessary  ? Authorization Type Medicare   ? Authorization - Visit Number 9   ? Progress Note Due on Visit 10   ? SLP Start Time 1100   ? SLP Stop Time  1200   ? SLP Time Calculation (min) 60 min   ? Activity Tolerance Patient tolerated treatment well   ? ?  ?  ? ?  ? ? ?Past Medical History:  ?Diagnosis Date  ? Arthritis   ? lower back  ? Cardiomyopathy ([place])   ? with diastolic dysfunction  ? Chronic sinus bradycardia   ? Coronary artery disease 06/24/2013  ? 1. 03/10/12:Diffuse 90% stenosis mid right coronary artery treated medically because of inability to cannilate the vessel during cardiac catherization 03/10/2012  ? Dizziness   ? chronic  ? Environmental allergies   ? w/ allergic rhinitis  ? GERD (gastroesophageal reflux disease) 06/25/2013  ? H/O vertigo   ? Heart disease   ? History of degenerative disc disease   ? History of diverticulosis   ? History of kidney stones   ? History of seasonal allergies   ? Hyperlipidemia, unspecified   ? Hypertension   ? Nephrolithiasis   ? Non-cardiac chest pain   ? Osteoporosis, post-menopausal   ? Parkinson disease ([place])   ? PVC's (premature ventricular contractions)   ? Vascular disease   ? Wears dentures   ? full upper and lower  ? ? ?Past Surgical History:  ?Procedure Laterality Date  ? APPENDECTOMY    ? BREAST EXCISIONAL BIOPSY Left 1984  ? neg  ? BREAST EXCISIONAL BIOPSY Right 1997  ? lipoma  ? CATARACT EXTRACTION W/PHACO Left 09/02/2017  ?  Procedure: CATARACT EXTRACTION PHACO AND INTRAOCULAR LENS PLACEMENT ([place])  LEFT;  Surgeon: Leandrew Koyanagi, MD;  Location: [facility];  Service: Ophthalmology;  Laterality: Left;  ? CATARACT EXTRACTION W/PHACO Right 10/01/2017  ? Procedure: CATARACT EXTRACTION PHACO AND INTRAOCULAR LENS PLACEMENT ([place]) RIGHT;  Surgeon: Leandrew Koyanagi, MD;  Location: [facility];  Service: Ophthalmology;  Laterality: Right;  ? COLONOSCOPY WITH PROPOFOL N/A 11/27/2016  ? Procedure: COLONOSCOPY WITH PROPOFOL;  Surgeon: Toledo, Benay Pike, MD;  Location: [facility];  Service: Gastroenterology;  Laterality: N/A;  ? CORONARY ANGIOPLASTY WITH STENT PLACEMENT    ? KNEE ARTHROPLASTY Left 01/22/2017  ? Procedure: COMPUTER ASSISTED TOTAL KNEE ARTHROPLASTY;  Surgeon: Dereck Leep, MD;  Location: [facility];  Service: Orthopedics;  Laterality: Left;  ? KNEE ARTHROSCOPY Left 02/01/2015  ? Procedure: ARTHROSCOPY KNEE, partial medial & lateral menisectomy,,medial and patelofemoral chondroplasty;  Surgeon: Dereck Leep, MD;  Location: [facility];  Service: Orthopedics;  Laterality: Left;  ? LITHOTRIPSY Right 1995  ? RIGHT AXILLARY LIPOMA REMOVAL    ? TUBAL LIGATION    ? VARICOSE VEIN SURGERY    ? ? ?There were no vitals filed for this visit. ? ? Subjective Assessment - 04/10/21 1106   ? ? Subjective Reports she completed all  but 1 practice session   ? Currently in Pain? Yes   ? Pain Score 5    ? Pain Location Knee   ? Pain Orientation Right;Posterior   ? ?  ?  ? ?  ? ? ? ? ? ? ? ? ADULT SLP TREATMENT - 04/10/21 1107   ? ?  ? General Information  ? Behavior/Cognition Alert;Cooperative;Pleasant mood   ? HPI Patient is an 81 y.o. female with past medical history noted for Parkinson's disease (dx 2019), GERD, HTN, CAD, osteoarthritis referred by Dr. Manuella Ghazi for LSVT LOUD evaluation and treatment. Pt known to SLP from prior OP MBS on 04/12/20 with findings of mild oral dysphagia. Regular diet and thin liquids was recommended. Per  MD notes pt family noting hoarse, raspy vocal quality, drooling.   ?  ? Cognitive-Linquistic Treatment  ? Treatment focused on Dysarthria;Patient/family/caregiver education   ? Skilled Treatment LSVT LOUD Daily tasks: Loud /a/ averaged 82 dB with rare min for shaping loudness, pitch and quality, duration 4.6 seconds.  Max F0 for pitch glides was 296 Hz (avg 82 dB) with occasional min-mod cues for vocal quality and pitch, and min F0 for low glides was 175 Hz (82 dB), with rare min cues (pitch, loudness). Functional phrases averaged 76 dB with modified independence. Hierarchical loudness drills at paragraph level with cognitive load averaged 75 dB. Conversation 5-8 minutes between structured tasks averaged 74 dB with rare min cues.   ?  ? Assessment / Recommendations / Plan  ? Plan Continue with current plan of care   ?  ? Progression Toward Goals  ? Progression toward goals Progressing toward goals   ? ?  ?  ? ?  ? ? ? SLP Education - 04/10/21 1324   ? ? Education Details self-monitoring/correcting   ? Person(s) Educated Patient   ? Methods Explanation   ? Comprehension Verbalized understanding   ? ?  ?  ? ?  ? ? ? SLP Short Term Goals - 03/22/21 1244   ? ?  ? SLP SHORT TERM GOAL #1  ? Title The patient will complete Daily Tasks (Maximum duration "ah", High/Lows, and Functional Phrases) at average loudness >/= 80 dB and with loud, good quality voice with min cues.   ? Time 10   ? Period --   visits  ? Status New   ?  ? SLP SHORT TERM GOAL #2  ? Title The patient will complete Hierarchal Speech Loudness reading drills (words/phrases, sentences) at average >/= 75 dB and with loud, good quality voice with min cues.   ? Time 10   ? Period --   visits  ? Status New   ?  ? SLP SHORT TERM GOAL #3  ? Title The patient will participate in 5-8 minutes conversation, maintaining average loudness of 75 dB and good quality voice with modified independence.   ? Time 10   ? Period --   visits  ? Status New   ? ?  ?  ? ?  ? ? ? SLP  Long Term Goals - 03/22/21 1245   ? ?  ? SLP LONG TERM GOAL #1  ? Title The patient will complete Daily Tasks (Maximum duration "ah", High/Lows, and Functional Phrases) at average loudness >/= 80 dB and with loud, good quality voice.   ? Time 12   ? Period Weeks   ? Status New   ? Target Date 05/21/21   ?  ? SLP LONG TERM GOAL #2  ?  Title The patient will complete Hierarchal Speech Loudness reading drills (words/phrases, sentences, and paragraph) at average >/= 75 dB and with loud, good quality voice.   ? Time 12   ? Period Weeks   ? Status New   ? Target Date 05/21/21   ?  ? SLP LONG TERM GOAL #3  ? Title The patient will participate in 20 minutes conversation, maintaining average loudness of >/= 75 dB and loud, good quality voice.   ? Time 12   ? Period Weeks   ? Status New   ? Target Date 05/21/21   ?  ? SLP LONG TERM GOAL #4  ? Title The patient will report improved communication effectiveness as measured by the Communication Effectiveness Survey.   ? Time 12   ? Period Weeks   ? Status New   ? Target Date 05/21/21   ? ?  ?  ? ?  ? ? ? Plan - 04/10/21 1328   ? ? Clinical Impression Statement Patient continues to improve vocal quality today in structured tasks with increased carryover to spontaneous responses. Pt requires reduced level of cuing for daily tasks compared with previous sessions. Some fatigue noted in last 5 minutes of session. Continue LSVT LOUD to maximize pt's vocal quality and intelligibility for conversations with family and friends to reduce social isolation and improve quality of life.   ? Speech Therapy Frequency 4x / week   ? Duration 4 weeks   ? Treatment/Interventions Aspiration precaution training;Cueing hierarchy;SLP instruction and feedback;Functional tasks;Patient/family education;Other (comment)   LSVT LOUD  ? Potential to Achieve Goals Good   ? SLP Home Exercise Plan LSVT LOUD Daily tasks   ? Consulted and Agree with Plan of Care Patient   ? ?  ?  ? ?  ? ? ?Patient will benefit from  skilled therapeutic intervention in order to improve the following deficits and impairments:   ?Dysarthria and anarthria ? ?Parkinson disease ([place]) ? ? ? ?Problem List ?Patient Active Problem List  ? Diagnosis

## 2021-04-11 ENCOUNTER — Ambulatory Visit: Payer: Medicare Other | Admitting: Speech Pathology

## 2021-04-11 DIAGNOSIS — R471 Dysarthria and anarthria: Secondary | ICD-10-CM | POA: Diagnosis not present

## 2021-04-11 DIAGNOSIS — G20 Parkinson's disease: Secondary | ICD-10-CM

## 2021-04-11 NOTE — Therapy (Signed)
[facility] SERVICES [address] [place], [place], [address] Phone: [phone]   Fax:  [phone]  Speech Language Pathology Treatment  Patient Details  Name: Katelyn Crawford MRN: [id number] Date of Birth: [birth year] Referring Provider (SLP): Dr. Cristopher Peru  Speech Therapy Progress Note  Dates of Reporting Period: 03/22/2021 to 04/11/2021   Objective: Patient has been seen for 10 speech therapy sessions this reporting period targeting dysarthria. Patient is making progress toward LTGs and met 3/3 STGs this reporting period. See skilled intervention, clinical impressions, and goals below for details.  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 03/10/12:Diffuse 90% stenosis mid right coronary artery treated medically because of inability to cannilate the vessel during cardiac catherization 03/10/2012   Dizziness    chronic   Environmental allergies    w/ allergic rhinitis   GERD (gastroesophageal reflux disease) 06/25/2013   H/O vertigo    Heart disease    History of degenerative disc disease    History of diverticulosis    History of kidney stones    History of seasonal allergies    Hyperlipidemia, unspecified    Hypertension    Nephrolithiasis    Non-cardiac chest pain    Osteoporosis, post-menopausal    Parkinson  disease ([facility])    PVC's (premature ventricular contractions)    Vascular disease    Wears dentures    full upper and lower    Past Surgical History:  Procedure Laterality Date   APPENDECTOMY     BREAST EXCISIONAL BIOPSY Left 1984   neg   BREAST EXCISIONAL BIOPSY Right 1997   lipoma   CATARACT EXTRACTION W/PHACO Left 09/02/2017   Procedure: CATARACT EXTRACTION PHACO AND INTRAOCULAR LENS PLACEMENT ([facility])  LEFT;  Surgeon: Lockie Mola, MD;  Location: [facility];  Service: Ophthalmology;  Laterality: Left;   CATARACT EXTRACTION W/PHACO Right 10/01/2017   Procedure: CATARACT EXTRACTION PHACO AND INTRAOCULAR LENS PLACEMENT ([facility]) RIGHT;  Surgeon: Lockie Mola, MD;  Location: [facility];  Service: Ophthalmology;  Laterality: Right;   COLONOSCOPY WITH PROPOFOL N/A 11/27/2016   Procedure: COLONOSCOPY WITH PROPOFOL;  Surgeon: Toledo, Boykin Nearing, MD;  Location: [facility];  Service: Gastroenterology;  Laterality: N/A;   CORONARY ANGIOPLASTY WITH STENT PLACEMENT     KNEE ARTHROPLASTY Left 01/22/2017   Procedure: COMPUTER ASSISTED TOTAL KNEE ARTHROPLASTY;  Surgeon: Donato Heinz, MD;  Location: [facility];  Service: Orthopedics;  Laterality: Left;   KNEE ARTHROSCOPY Left 02/01/2015   Procedure: ARTHROSCOPY KNEE, partial medial & lateral menisectomy,,medial and patelofemoral chondroplasty;  Surgeon: Donato Heinz, MD;  Location: [facility];  Service: Orthopedics;  Laterality: Left;  LITHOTRIPSY Right 1995   RIGHT AXILLARY LIPOMA REMOVAL     TUBAL LIGATION     VARICOSE VEIN SURGERY      There were no vitals filed for this visit.   Subjective Assessment - 04/11/21 1109     Subjective Reports she is tired    Currently in Pain? Yes    Pain Score 6     Pain Location Knee    Pain Orientation Right;Posterior                   ADULT SLP TREATMENT - 04/11/21 1149       General Information   Behavior/Cognition Alert;Cooperative;Pleasant mood    HPI Patient  is an 81 y.o. female with past medical history noted for Parkinson's disease (dx 2019), GERD, HTN, CAD, osteoarthritis referred by Dr. Sherryll Burger for LSVT LOUD evaluation and treatment. Pt known to SLP from prior OP MBS on 04/12/20 with findings of mild oral dysphagia. Regular diet and thin liquids was recommended. Per MD notes pt family noting hoarse, raspy vocal quality, drooling.      Cognitive-Linquistic Treatment   Treatment focused on Dysarthria;Patient/family/caregiver education    Skilled Treatment LSVT LOUD Daily tasks: Loud /a/ averaged 82 dB with rare min for shaping loudness, pitch and quality, duration 3.7 seconds.  Max F0 for pitch glides was 301 Hz (avg 82 dB) with occasional min-mod cues for vocal quality and pitch, and min F0 for low glides was 169 Hz (80 dB), with rare min cues (pitch, loudness). Functional phrases averaged 76 dB with modified independence. Hierarchical loudness drills at multi-paragraph level averaged 73 dB. Conversation 8-10 minutes between structured tasks averaged 76 dB with rare min cues.      Assessment / Recommendations / Plan   Plan Continue with current plan of care      Progression Toward Goals   Progression toward goals Progressing toward goals              SLP Education - 04/11/21 1234     Education Details breath helps to support her loud voice    Person(s) Educated Patient    Methods Explanation    Comprehension Verbalized understanding              SLP Short Term Goals - 04/11/21 1235       SLP SHORT TERM GOAL #1   Title The patient will complete Daily Tasks (Maximum duration "ah", High/Lows, and Functional Phrases) at average loudness >/= 80 dB and with loud, good quality voice with min cues.    Time 10    Period --   visits   Status Achieved      SLP SHORT TERM GOAL #2   Title The patient will complete Hierarchal Speech Loudness reading drills (words/phrases, sentences) at average >/= 75 dB and with loud, good quality voice with  min cues.    Time 10    Period --   visits   Status Achieved      SLP SHORT TERM GOAL #3   Title The patient will participate in 5-8 minutes conversation, maintaining average loudness of 75 dB and good quality voice with modified independence.    Time 10    Period --   visits   Status Achieved              SLP Long Term Goals - 04/11/21 1235       SLP LONG TERM GOAL #1   Title The patient will complete Daily Tasks (  Maximum duration "ah", High/Lows, and Functional Phrases) at average loudness >/= 80 dB and with loud, good quality voice.    Time 12    Period Weeks    Status On-going      SLP LONG TERM GOAL #2   Title The patient will complete Hierarchal Speech Loudness reading drills (words/phrases, sentences, and paragraph) at average >/= 75 dB and with loud, good quality voice.    Time 12    Period Weeks    Status On-going      SLP LONG TERM GOAL #3   Title The patient will participate in 20 minutes conversation, maintaining average loudness of >/= 75 dB and loud, good quality voice.    Time 12    Period Weeks    Status On-going      SLP LONG TERM GOAL #4   Title The patient will report improved communication effectiveness as measured by the Communication Effectiveness Survey.    Time 12    Period Weeks    Status On-going              Plan - 04/11/21 1234     Clinical Impression Statement Patient continues to improve vocal quality today in structured tasks with increased carryover to spontaneous responses. Pt requires reduced level of cuing for daily tasks compared with previous sessions. Pt able to increase duration of conversation while maintaining loud, good quality voice to 10 minutes today with min cues. Continue LSVT LOUD to maximize pt's vocal quality and intelligibility for conversations with family and friends to reduce social isolation and improve quality of life.    Speech Therapy Frequency 4x / week    Duration 4 weeks    Treatment/Interventions  Aspiration precaution training;Cueing hierarchy;SLP instruction and feedback;Functional tasks;Patient/family education;Other (comment)   LSVT LOUD   Potential to Achieve Goals Good    SLP Home Exercise Plan LSVT LOUD Daily tasks    Consulted and Agree with Plan of Care Patient             Patient will benefit from skilled therapeutic intervention in order to improve the following deficits and impairments:   Dysarthria and anarthria  Parkinson disease ([facility])    Problem List Patient Active Problem List   Diagnosis Date Noted   Primary osteoarthritis of hip 02/09/2017   Primary osteoarthritis of knee 02/09/2017   Chronic sinus bradycardia 01/22/2017   Degenerative disc disease, lumbar 01/22/2017   Dizziness 01/22/2017   Nephrolithiasis 01/22/2017   Osteoporosis, 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/22/2017   PVC's (premature ventricular contractions) 01/22/2017   S/P total knee arthroplasty 01/22/2017   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 [place], [place], [address] Phone: [phone]   Fax:  [phone]   Name: Katelyn Crawford MRN: [id number] Date of Birth: February 16, 1940

## 2021-04-12 ENCOUNTER — Ambulatory Visit: Payer: Medicare Other | Admitting: Speech Pathology

## 2021-04-12 ENCOUNTER — Other Ambulatory Visit: Payer: Self-pay

## 2021-04-12 DIAGNOSIS — G20 Parkinson's disease: Secondary | ICD-10-CM

## 2021-04-12 DIAGNOSIS — R471 Dysarthria and anarthria: Secondary | ICD-10-CM

## 2021-04-12 NOTE — Therapy (Deleted)
[place] ?[facility] ?[address] ?[place], [place], [address] ?Phone: [phone]   Fax:  [phone] ? ?Speech Language Pathology Treatment ? ?Patient Details  ?Name: Katelyn Crawford ?MRN: [id number] ?Date of Birth: 1940/07/10 ?Referring Provider (SLP): Dr. Cristopher Peru ? ? ?Encounter Date: 04/12/2021 ? ? End of Session - 04/12/21 1538   ? ? Visit Number 11   ? Number of Visits 17   ? Date for SLP Re-Evaluation 05/21/21   extended to allow for scheduling or missed visit make-ups if necessary  ? Authorization Type Medicare   ? Authorization - Visit Number 11   ? Progress Note Due on Visit 10   ? SLP Start Time 1100   ? SLP Stop Time  1200   ? SLP Time Calculation (min) 60 min   ? Activity Tolerance Patient tolerated treatment well   ? ?  ?  ? ?  ? ? ?Past Medical History:  ?Diagnosis Date  ? Arthritis   ? lower back  ? Cardiomyopathy (HCC)   ? with diastolic dysfunction  ? Chronic sinus bradycardia   ? Coronary artery disease 06/24/2013  ? 1. 03/10/12:Diffuse 90% stenosis mid right coronary artery treated medically because of inability to cannilate the vessel during cardiac catherization 03/10/2012  ? Dizziness   ? chronic  ? Environmental allergies   ? w/ allergic rhinitis  ? GERD (gastroesophageal reflux disease) 06/25/2013  ? H/O vertigo   ? Heart disease   ? History of degenerative disc disease   ? History of diverticulosis   ? History of kidney stones   ? History of seasonal allergies   ? Hyperlipidemia, unspecified   ? Hypertension   ? Nephrolithiasis   ? Non-cardiac chest pain   ? Osteoporosis, post-menopausal   ? Parkinson disease (HCC)   ? PVC's (premature ventricular contractions)   ? Vascular disease   ? Wears dentures   ? full upper and lower  ? ? ?Past Surgical History:  ?Procedure Laterality Date  ? APPENDECTOMY    ? BREAST EXCISIONAL BIOPSY Left 1984  ? neg  ? BREAST EXCISIONAL BIOPSY Right 1997  ? lipoma  ? CATARACT EXTRACTION W/PHACO Left 09/02/2017  ?  Procedure: CATARACT EXTRACTION PHACO AND INTRAOCULAR LENS PLACEMENT (IOC)  LEFT;  Surgeon: Lockie Mola, MD;  Location: [facility];  Service: Ophthalmology;  Laterality: Left;  ? CATARACT EXTRACTION W/PHACO Right 10/01/2017  ? Procedure: CATARACT EXTRACTION PHACO AND INTRAOCULAR LENS PLACEMENT (IOC) RIGHT;  Surgeon: Lockie Mola, MD;  Location: [facility];  Service: Ophthalmology;  Laterality: Right;  ? COLONOSCOPY WITH PROPOFOL N/A 11/27/2016  ? Procedure: COLONOSCOPY WITH PROPOFOL;  Surgeon: Toledo, Boykin Nearing, MD;  Location: [facility];  Service: Gastroenterology;  Laterality: N/A;  ? CORONARY ANGIOPLASTY WITH STENT PLACEMENT    ? KNEE ARTHROPLASTY Left 01/22/2017  ? Procedure: COMPUTER ASSISTED TOTAL KNEE ARTHROPLASTY;  Surgeon: Donato Heinz, MD;  Location: [facility];  Service: Orthopedics;  Laterality: Left;  ? KNEE ARTHROSCOPY Left 02/01/2015  ? Procedure: ARTHROSCOPY KNEE, partial medial & lateral menisectomy,,medial and patelofemoral chondroplasty;  Surgeon: Donato Heinz, MD;  Location: [facility];  Service: Orthopedics;  Laterality: Left;  ? LITHOTRIPSY Right 1995  ? RIGHT AXILLARY LIPOMA REMOVAL    ? TUBAL LIGATION    ? VARICOSE VEIN SURGERY    ? ? ?There were no vitals filed for this visit. ? ? Subjective Assessment - 04/12/21 1502   ? ? Subjective "My daughter said my  voice sounds clearer."   ? Currently in Pain? Yes   ? Pain Score 4    ? Pain Location Knee   ? Pain Orientation Right;Posterior   ? ?  ?  ? ?  ? ? ? ? ? ? ? ? ADULT SLP TREATMENT - 04/12/21 1504   ? ?  ? General Information  ? Behavior/Cognition Alert;Cooperative;Pleasant mood   ? HPI Patient is an 81 y.o. female with past medical history noted for Parkinson's disease (dx 2019), GERD, HTN, CAD, osteoarthritis referred by Dr. Sherryll Burger for LSVT LOUD evaluation and treatment. Pt known to SLP from prior OP MBS on 04/12/20 with findings of mild oral dysphagia. Regular diet and thin liquids was recommended. Per MD  notes pt family noting hoarse, raspy vocal quality, drooling.   ?  ? Cognitive-Linquistic Treatment  ? Treatment focused on Dysarthria;Patient/family/caregiver education   ? Skilled Treatment LSVT LOUD Daily tasks: Loud /a/ averaged 82 dB with rare min for shaping loudness, pitch and quality, duration 4 seconds. Max F0 for pitch glides was 309 Hz (avg 82 dB) with initial min-mod cues for vocal quality and pitch, faded to occasional min cues. Min F0 for low glides was 172 Hz (82 dB), with rare min cues (pitch, loudness). Functional phrases averaged 77 dB with modified independence. Hierarchical loudness drills at multi-paragraph level averaged 74 dB. Conversation between structured tasks averaged 71 dB with mod cues for loudness. Carryover task: Use LOUD voice during dinner plans this weekend.   ?  ? Assessment / Recommendations / Plan  ? Plan Continue with current plan of care   ?  ? Progression Toward Goals  ? Progression toward goals Progressing toward goals   ? ?  ?  ? ?  ? ? ? SLP Education - 04/12/21 1531   ? ? Education Details LOUD voice is not too loud   ? Person(s) Educated Patient   ? Methods Explanation   ? Comprehension Verbalized understanding   ? ?  ?  ? ?  ? ? ? SLP Short Term Goals - 04/11/21 1235   ? ?  ? SLP SHORT TERM GOAL #1  ? Title The patient will complete Daily Tasks (Maximum duration "ah", High/Lows, and Functional Phrases) at average loudness >/= 80 dB and with loud, good quality voice with min cues.   ? Time 10   ? Period --   visits  ? Status Achieved   ?  ? SLP SHORT TERM GOAL #2  ? Title The patient will complete Hierarchal Speech Loudness reading drills (words/phrases, sentences) at average >/= 75 dB and with loud, good quality voice with min cues.   ? Time 10   ? Period --   visits  ? Status Achieved   ?  ? SLP SHORT TERM GOAL #3  ? Title The patient will participate in 5-8 minutes conversation, maintaining average loudness of 75 dB and good quality voice with modified independence.    ? Time 10   ? Period --   visits  ? Status Achieved   ? ?  ?  ? ?  ? ? ? SLP Long Term Goals - 04/11/21 1235   ? ?  ? SLP LONG TERM GOAL #1  ? Title The patient will complete Daily Tasks (Maximum duration "ah", High/Lows, and Functional Phrases) at average loudness >/= 80 dB and with loud, good quality voice.   ? Time 12   ? Period Weeks   ? Status On-going   ?  ?  SLP LONG TERM GOAL #2  ? Title The patient will complete Hierarchal Speech Loudness reading drills (words/phrases, sentences, and paragraph) at average >/= 75 dB and with loud, good quality voice.   ? Time 12   ? Period Weeks   ? Status On-going   ?  ? SLP LONG TERM GOAL #3  ? Title The patient will participate in 20 minutes conversation, maintaining average loudness of >/= 75 dB and loud, good quality voice.   ? Time 12   ? Period Weeks   ? Status On-going   ?  ? SLP LONG TERM GOAL #4  ? Title The patient will report improved communication effectiveness as measured by the Communication Effectiveness Survey.   ? Time 12   ? Period Weeks   ? Status On-going   ? ?  ?  ? ?  ? ? ? Plan - 04/12/21 1529   ? ? Clinical Impression Statement Patient continues to improve vocal quality today in structured tasks with increased carryover to spontaneous responses. Pt wore hearing aids today; suspect additional cueing that was required in spontaneous conversation is due to pt needing additional time to adjust to how LOUD voice sounds while wearing hearing aids. Encouraged pt to wear hearing aids in upcoming sessions. Continue LSVT LOUD to maximize pt's vocal quality and intelligibility for conversations with family and friends to reduce social isolation and improve quality of life.   ? Speech Therapy Frequency 4x / week   ? Duration 4 weeks   ? Treatment/Interventions Aspiration precaution training;Cueing hierarchy;SLP instruction and feedback;Functional tasks;Patient/family education;Other (comment)   LSVT LOUD  ? Potential to Achieve Goals Good   ? SLP Home Exercise  Plan LSVT LOUD Daily tasks   ? Consulted and Agree with Plan of Care Patient   ? ?  ?  ? ?  ? ? ?Patient will benefit from skilled therapeutic intervention in order to improve the following deficits and impairm

## 2021-04-12 NOTE — Therapy (Signed)
[place] ?[facility] ?[address] ?[place], [place], [address] ?Phone: [phone]   Fax:  [phone] ? ?Speech Language Pathology Treatment ? ?Patient Details  ?Name: Katelyn Crawford ?MRN: [id number] ?Date of Birth: 1940/07/10 ?Referring Provider (SLP): Dr. Cristopher Peru ? ? ?Encounter Date: 04/12/2021 ? ? End of Session - 04/12/21 1538   ? ? Visit Number 11   ? Number of Visits 17   ? Date for SLP Re-Evaluation 05/21/21   extended to allow for scheduling or missed visit make-ups if necessary  ? Authorization Type Medicare   ? Authorization - Visit Number 11   ? Progress Note Due on Visit 10   ? SLP Start Time 1100   ? SLP Stop Time  1200   ? SLP Time Calculation (min) 60 min   ? Activity Tolerance Patient tolerated treatment well   ? ?  ?  ? ?  ? ? ?Past Medical History:  ?Diagnosis Date  ? Arthritis   ? lower back  ? Cardiomyopathy (HCC)   ? with diastolic dysfunction  ? Chronic sinus bradycardia   ? Coronary artery disease 06/24/2013  ? 1. 03/10/12:Diffuse 90% stenosis mid right coronary artery treated medically because of inability to cannilate the vessel during cardiac catherization 03/10/2012  ? Dizziness   ? chronic  ? Environmental allergies   ? w/ allergic rhinitis  ? GERD (gastroesophageal reflux disease) 06/25/2013  ? H/O vertigo   ? Heart disease   ? History of degenerative disc disease   ? History of diverticulosis   ? History of kidney stones   ? History of seasonal allergies   ? Hyperlipidemia, unspecified   ? Hypertension   ? Nephrolithiasis   ? Non-cardiac chest pain   ? Osteoporosis, post-menopausal   ? Parkinson disease (HCC)   ? PVC's (premature ventricular contractions)   ? Vascular disease   ? Wears dentures   ? full upper and lower  ? ? ?Past Surgical History:  ?Procedure Laterality Date  ? APPENDECTOMY    ? BREAST EXCISIONAL BIOPSY Left 1984  ? neg  ? BREAST EXCISIONAL BIOPSY Right 1997  ? lipoma  ? CATARACT EXTRACTION W/PHACO Left 09/02/2017  ?  Procedure: CATARACT EXTRACTION PHACO AND INTRAOCULAR LENS PLACEMENT (IOC)  LEFT;  Surgeon: Lockie Mola, MD;  Location: [facility];  Service: Ophthalmology;  Laterality: Left;  ? CATARACT EXTRACTION W/PHACO Right 10/01/2017  ? Procedure: CATARACT EXTRACTION PHACO AND INTRAOCULAR LENS PLACEMENT (IOC) RIGHT;  Surgeon: Lockie Mola, MD;  Location: [facility];  Service: Ophthalmology;  Laterality: Right;  ? COLONOSCOPY WITH PROPOFOL N/A 11/27/2016  ? Procedure: COLONOSCOPY WITH PROPOFOL;  Surgeon: Toledo, Boykin Nearing, MD;  Location: [facility];  Service: Gastroenterology;  Laterality: N/A;  ? CORONARY ANGIOPLASTY WITH STENT PLACEMENT    ? KNEE ARTHROPLASTY Left 01/22/2017  ? Procedure: COMPUTER ASSISTED TOTAL KNEE ARTHROPLASTY;  Surgeon: Donato Heinz, MD;  Location: [facility];  Service: Orthopedics;  Laterality: Left;  ? KNEE ARTHROSCOPY Left 02/01/2015  ? Procedure: ARTHROSCOPY KNEE, partial medial & lateral menisectomy,,medial and patelofemoral chondroplasty;  Surgeon: Donato Heinz, MD;  Location: [facility];  Service: Orthopedics;  Laterality: Left;  ? LITHOTRIPSY Right 1995  ? RIGHT AXILLARY LIPOMA REMOVAL    ? TUBAL LIGATION    ? VARICOSE VEIN SURGERY    ? ? ?There were no vitals filed for this visit. ? ? Subjective Assessment - 04/12/21 1502   ? ? Subjective "My daughter said my  voice sounds clearer."   ? Currently in Pain? Yes   ? Pain Score 4    ? Pain Location Knee   ? Pain Orientation Right;Posterior   ? ?  ?  ? ?  ? ? ? ? ? ? ? ? ADULT SLP TREATMENT - 04/12/21 1504   ? ?  ? General Information  ? Behavior/Cognition Alert;Cooperative;Pleasant mood   ? HPI Patient is an 81 y.o. female with past medical history noted for Parkinson's disease (dx 2019), GERD, HTN, CAD, osteoarthritis referred by Dr. Shah for LSVT LOUD evaluation and treatment. Pt known to SLP from prior OP MBS on 04/12/20 with findings of mild oral dysphagia. Regular diet and thin liquids was recommended. Per MD  notes pt family noting hoarse, raspy vocal quality, drooling.   ?  ? Cognitive-Linquistic Treatment  ? Treatment focused on Dysarthria;Patient/family/caregiver education   ? Skilled Treatment LSVT LOUD Daily tasks: Loud /a/ averaged 82 dB with rare min for shaping loudness, pitch and quality, duration 4 seconds. Max F0 for pitch glides was 309 Hz (avg 82 dB) with initial min-mod cues for vocal quality and pitch, faded to occasional min cues. Min F0 for low glides was 172 Hz (82 dB), with rare min cues (pitch, loudness). Functional phrases averaged 77 dB with modified independence. Hierarchical loudness drills at multi-paragraph level averaged 74 dB. Conversation between structured tasks averaged 71 dB with mod cues for loudness. Carryover task: Use LOUD voice during dinner plans this weekend.   ?  ? Assessment / Recommendations / Plan  ? Plan Continue with current plan of care   ?  ? Progression Toward Goals  ? Progression toward goals Progressing toward goals   ? ?  ?  ? ?  ? ? ? SLP Education - 04/12/21 1531   ? ? Education Details LOUD voice is not too loud   ? Person(s) Educated Patient   ? Methods Explanation   ? Comprehension Verbalized understanding   ? ?  ?  ? ?  ? ? ? SLP Short Term Goals - 04/11/21 1235   ? ?  ? SLP SHORT TERM GOAL #1  ? Title The patient will complete Daily Tasks (Maximum duration "ah", High/Lows, and Functional Phrases) at average loudness >/= 80 dB and with loud, good quality voice with min cues.   ? Time 10   ? Period --   visits  ? Status Achieved   ?  ? SLP SHORT TERM GOAL #2  ? Title The patient will complete Hierarchal Speech Loudness reading drills (words/phrases, sentences) at average >/= 75 dB and with loud, good quality voice with min cues.   ? Time 10   ? Period --   visits  ? Status Achieved   ?  ? SLP SHORT TERM GOAL #3  ? Title The patient will participate in 5-8 minutes conversation, maintaining average loudness of 75 dB and good quality voice with modified independence.    ? Time 10   ? Period --   visits  ? Status Achieved   ? ?  ?  ? ?  ? ? ? SLP Long Term Goals - 04/11/21 1235   ? ?  ? SLP LONG TERM GOAL #1  ? Title The patient will complete Daily Tasks (Maximum duration "ah", High/Lows, and Functional Phrases) at average loudness >/= 80 dB and with loud, good quality voice.   ? Time 12   ? Period Weeks   ? Status On-going   ?  ?   SLP LONG TERM GOAL #2  ? Title The patient will complete Hierarchal Speech Loudness reading drills (words/phrases, sentences, and paragraph) at average >/= 75 dB and with loud, good quality voice.   ? Time 12   ? Period Weeks   ? Status On-going   ?  ? SLP LONG TERM GOAL #3  ? Title The patient will participate in 20 minutes conversation, maintaining average loudness of >/= 75 dB and loud, good quality voice.   ? Time 12   ? Period Weeks   ? Status On-going   ?  ? SLP LONG TERM GOAL #4  ? Title The patient will report improved communication effectiveness as measured by the Communication Effectiveness Survey.   ? Time 12   ? Period Weeks   ? Status On-going   ? ?  ?  ? ?  ? ? ? Plan - 04/12/21 1529   ? ? Clinical Impression Statement Patient continues to improve vocal quality today in structured tasks with increased carryover to spontaneous responses. Pt wore hearing aids today; suspect additional cueing that was required in spontaneous conversation is due to pt needing additional time to adjust to how LOUD voice sounds while wearing hearing aids. Encouraged pt to wear hearing aids in upcoming sessions. Continue LSVT LOUD to maximize pt's vocal quality and intelligibility for conversations with family and friends to reduce social isolation and improve quality of life.   ? Speech Therapy Frequency 4x / week   ? Duration 4 weeks   ? Treatment/Interventions Aspiration precaution training;Cueing hierarchy;SLP instruction and feedback;Functional tasks;Patient/family education;Other (comment)   LSVT LOUD  ? Potential to Achieve Goals Good   ? SLP Home Exercise  Plan LSVT LOUD Daily tasks   ? Consulted and Agree with Plan of Care Patient   ? ?  ?  ? ?  ? ? ?Patient will benefit from skilled therapeutic intervention in order to improve the following deficits and impairm

## 2021-04-16 ENCOUNTER — Ambulatory Visit: Payer: Medicare Other | Admitting: Speech Pathology

## 2021-04-16 ENCOUNTER — Other Ambulatory Visit: Payer: Self-pay

## 2021-04-16 DIAGNOSIS — G20 Parkinson's disease: Secondary | ICD-10-CM

## 2021-04-16 DIAGNOSIS — R471 Dysarthria and anarthria: Secondary | ICD-10-CM | POA: Diagnosis not present

## 2021-04-16 NOTE — Therapy (Signed)
[place] ?[facility] ?[address] ?[place], [place], [address] ?Phone: [phone]   Fax:  [phone] ? ?Speech Language Pathology Treatment ? ?Patient Details  ?Name: Katelyn Crawford ?MRN: [id number] ?Date of Birth: 1940-05-18 ?Referring Provider (SLP): Dr. Cristopher Peru ? ? ?Encounter Date: 04/16/2021 ? ? End of Session - 04/16/21 1417   ? ? Visit Number 12   ? Number of Visits 17   ? Date for SLP Re-Evaluation 05/21/21   extended to allow for scheduling or missed visit make-ups if necessary  ? Authorization Type Medicare   ? Authorization - Visit Number 2   ? Progress Note Due on Visit 10   ? SLP Start Time 1103   ? SLP Stop Time  1204   ? SLP Time Calculation (min) 61 min   ? Activity Tolerance Patient tolerated treatment well   ? ?  ?  ? ?  ? ? ?Past Medical History:  ?Diagnosis Date  ? Arthritis   ? lower back  ? Cardiomyopathy (HCC)   ? with diastolic dysfunction  ? Chronic sinus bradycardia   ? Coronary artery disease 06/24/2013  ? 1. 03/10/12:Diffuse 90% stenosis mid right coronary artery treated medically because of inability to cannilate the vessel during cardiac catherization 03/10/2012  ? Dizziness   ? chronic  ? Environmental allergies   ? w/ allergic rhinitis  ? GERD (gastroesophageal reflux disease) 06/25/2013  ? H/O vertigo   ? Heart disease   ? History of degenerative disc disease   ? History of diverticulosis   ? History of kidney stones   ? History of seasonal allergies   ? Hyperlipidemia, unspecified   ? Hypertension   ? Nephrolithiasis   ? Non-cardiac chest pain   ? Osteoporosis, post-menopausal   ? Parkinson disease (HCC)   ? PVC's (premature ventricular contractions)   ? Vascular disease   ? Wears dentures   ? full upper and lower  ? ? ?Past Surgical History:  ?Procedure Laterality Date  ? APPENDECTOMY    ? BREAST EXCISIONAL BIOPSY Left 1984  ? neg  ? BREAST EXCISIONAL BIOPSY Right 1997  ? lipoma  ? CATARACT EXTRACTION W/PHACO Left 09/02/2017  ?  Procedure: CATARACT EXTRACTION PHACO AND INTRAOCULAR LENS PLACEMENT (IOC)  LEFT;  Surgeon: Lockie Mola, MD;  Location: [facility];  Service: Ophthalmology;  Laterality: Left;  ? CATARACT EXTRACTION W/PHACO Right 10/01/2017  ? Procedure: CATARACT EXTRACTION PHACO AND INTRAOCULAR LENS PLACEMENT (IOC) RIGHT;  Surgeon: Lockie Mola, MD;  Location: [facility];  Service: Ophthalmology;  Laterality: Right;  ? COLONOSCOPY WITH PROPOFOL N/A 11/27/2016  ? Procedure: COLONOSCOPY WITH PROPOFOL;  Surgeon: Toledo, Boykin Nearing, MD;  Location: [facility];  Service: Gastroenterology;  Laterality: N/A;  ? CORONARY ANGIOPLASTY WITH STENT PLACEMENT    ? KNEE ARTHROPLASTY Left 01/22/2017  ? Procedure: COMPUTER ASSISTED TOTAL KNEE ARTHROPLASTY;  Surgeon: Donato Heinz, MD;  Location: [facility];  Service: Orthopedics;  Laterality: Left;  ? KNEE ARTHROSCOPY Left 02/01/2015  ? Procedure: ARTHROSCOPY KNEE, partial medial & lateral menisectomy,,medial and patelofemoral chondroplasty;  Surgeon: Donato Heinz, MD;  Location: [facility];  Service: Orthopedics;  Laterality: Left;  ? LITHOTRIPSY Right 1995  ? RIGHT AXILLARY LIPOMA REMOVAL    ? TUBAL LIGATION    ? VARICOSE VEIN SURGERY    ? ? ?There were no vitals filed for this visit. ? ? Subjective Assessment - 04/16/21 1409   ? ? Subjective Pt reports she tried  to use loud voice over the weekend   ? Currently in Pain? Yes   ? Pain Score 4    ? Pain Location Knee   ? ?  ?  ? ?  ? ? ? ? ? ? ? ? ADULT SLP TREATMENT - 04/16/21 1410   ? ?  ? General Information  ? Behavior/Cognition Alert;Cooperative;Pleasant mood   ? HPI Patient is an 81 y.o. female with past medical history noted for Parkinson's disease (dx 2019), GERD, HTN, CAD, osteoarthritis referred by Dr. Sherryll Burger for LSVT LOUD evaluation and treatment. Pt known to SLP from prior OP MBS on 04/12/20 with findings of mild oral dysphagia. Regular diet and thin liquids was recommended. Per MD notes pt family noting  hoarse, raspy vocal quality, drooling.   ?  ? Cognitive-Linquistic Treatment  ? Treatment focused on Dysarthria;Patient/family/caregiver education   ? Skilled Treatment LSVT LOUD Daily tasks: Loud /a/ averaged 81 dB with rare min for breath support/reducing strain, duration 3.8 seconds. Max F0 for pitch glides was 313 Hz (avg 82 dB) with occasional min cues for vocal quality, loudness. Min F0 for low glides was 161 Hz (81 dB) with modified independence. Functional phrases averaged 76 dB with modified independence. Hierarchical loudness drills at multi-paragraph level averaged 74 dB. Structured conversation averaged 74.5 dB with occasional mod cues for louder speech. Carryover task: Use LOUD voice with circle group this evening.   ?  ? Assessment / Recommendations / Plan  ? Plan Continue with current plan of care   ?  ? Progression Toward Goals  ? Progression toward goals Progressing toward goals   ? ?  ?  ? ?  ? ? ? SLP Education - 04/16/21 1416   ? ? Education Details Loudness improves vocal quality, reduces her vocal tremor   ? Person(s) Educated Patient   ? Methods Explanation   ? Comprehension Verbalized understanding   ? ?  ?  ? ?  ? ? ? SLP Short Term Goals - 04/11/21 1235   ? ?  ? SLP SHORT TERM GOAL #1  ? Title The patient will complete Daily Tasks (Maximum duration "ah", High/Lows, and Functional Phrases) at average loudness >/= 80 dB and with loud, good quality voice with min cues.   ? Time 10   ? Period --   visits  ? Status Achieved   ?  ? SLP SHORT TERM GOAL #2  ? Title The patient will complete Hierarchal Speech Loudness reading drills (words/phrases, sentences) at average >/= 75 dB and with loud, good quality voice with min cues.   ? Time 10   ? Period --   visits  ? Status Achieved   ?  ? SLP SHORT TERM GOAL #3  ? Title The patient will participate in 5-8 minutes conversation, maintaining average loudness of 75 dB and good quality voice with modified independence.   ? Time 10   ? Period --   visits   ? Status Achieved   ? ?  ?  ? ?  ? ? ? SLP Long Term Goals - 04/11/21 1235   ? ?  ? SLP LONG TERM GOAL #1  ? Title The patient will complete Daily Tasks (Maximum duration "ah", High/Lows, and Functional Phrases) at average loudness >/= 80 dB and with loud, good quality voice.   ? Time 12   ? Period Weeks   ? Status On-going   ?  ? SLP LONG TERM GOAL #2  ? Title The  patient will complete Hierarchal Speech Loudness reading drills (words/phrases, sentences, and paragraph) at average >/= 75 dB and with loud, good quality voice.   ? Time 12   ? Period Weeks   ? Status On-going   ?  ? SLP LONG TERM GOAL #3  ? Title The patient will participate in 20 minutes conversation, maintaining average loudness of >/= 75 dB and loud, good quality voice.   ? Time 12   ? Period Weeks   ? Status On-going   ?  ? SLP LONG TERM GOAL #4  ? Title The patient will report improved communication effectiveness as measured by the Communication Effectiveness Survey.   ? Time 12   ? Period Weeks   ? Status On-going   ? ?  ?  ? ?  ? ? ? Plan - 04/16/21 1417   ? ? Clinical Impression Statement Patient is improving vocal quality and loudness in structured tasks and demonstrates increased carryover to spontaneous responses. Pt wore hearing aids to session today. Pt reports it is difficult to think about being loud in conversation but she is eager to continue to improve this during sessions this week. Continue LSVT LOUD to maximize pt's vocal quality and intelligibility for conversations with family and friends to reduce social isolation and improve quality of life.   ? Speech Therapy Frequency 4x / week   ? Duration 4 weeks   ? Treatment/Interventions Aspiration precaution training;Cueing hierarchy;SLP instruction and feedback;Functional tasks;Patient/family education;Other (comment)   LSVT LOUD  ? Potential to Achieve Goals Good   ? SLP Home Exercise Plan LSVT LOUD Daily tasks   ? Consulted and Agree with Plan of Care Patient   ? ?  ?  ? ?   ? ? ?Patient will benefit from skilled therapeutic intervention in order to improve the following deficits and impairments:   ?Dysarthria and anarthria ? ?Parkinson disease (HCC) ? ? ? ?Problem List ?Patient Active Problem

## 2021-04-17 ENCOUNTER — Ambulatory Visit: Payer: Medicare Other | Admitting: Speech Pathology

## 2021-04-17 DIAGNOSIS — G20 Parkinson's disease: Secondary | ICD-10-CM

## 2021-04-17 DIAGNOSIS — R471 Dysarthria and anarthria: Secondary | ICD-10-CM | POA: Diagnosis not present

## 2021-04-17 NOTE — Therapy (Signed)
[place] ?[facility] ?[address] ?[place], [place], [address] ?Phone: [phone]   Fax:  [phone] ? ?Speech Language Pathology Treatment ? ?Patient Details  ?Name: Katelyn Crawford ?MRN: [id number] ?Date of Birth: 1940-10-08 ?Referring Provider (SLP): Dr. Cristopher Peru ? ? ?Encounter Date: 04/17/2021 ? ? End of Session - 04/17/21 1230   ? ? Visit Number 13   ? Number of Visits 17   ? Date for SLP Re-Evaluation 05/21/21   extended to allow for scheduling or missed visit make-ups if necessary  ? Authorization Type Medicare   ? Authorization - Visit Number 3   ? Progress Note Due on Visit 10   ? SLP Start Time 1104   ? SLP Stop Time  1205   ? SLP Time Calculation (min) 61 min   ? Activity Tolerance Patient tolerated treatment well   ? ?  ?  ? ?  ? ? ?Past Medical History:  ?Diagnosis Date  ? Arthritis   ? lower back  ? Cardiomyopathy (HCC)   ? with diastolic dysfunction  ? Chronic sinus bradycardia   ? Coronary artery disease 06/24/2013  ? 1. 03/10/12:Diffuse 90% stenosis mid right coronary artery treated medically because of inability to cannilate the vessel during cardiac catherization 03/10/2012  ? Dizziness   ? chronic  ? Environmental allergies   ? w/ allergic rhinitis  ? GERD (gastroesophageal reflux disease) 06/25/2013  ? H/O vertigo   ? Heart disease   ? History of degenerative disc disease   ? History of diverticulosis   ? History of kidney stones   ? History of seasonal allergies   ? Hyperlipidemia, unspecified   ? Hypertension   ? Nephrolithiasis   ? Non-cardiac chest pain   ? Osteoporosis, post-menopausal   ? Parkinson disease (HCC)   ? PVC's (premature ventricular contractions)   ? Vascular disease   ? Wears dentures   ? full upper and lower  ? ? ?Past Surgical History:  ?Procedure Laterality Date  ? APPENDECTOMY    ? BREAST EXCISIONAL BIOPSY Left 1984  ? neg  ? BREAST EXCISIONAL BIOPSY Right 1997  ? lipoma  ? CATARACT EXTRACTION W/PHACO Left 09/02/2017  ?  Procedure: CATARACT EXTRACTION PHACO AND INTRAOCULAR LENS PLACEMENT (IOC)  LEFT;  Surgeon: Lockie Mola, MD;  Location: [facility];  Service: Ophthalmology;  Laterality: Left;  ? CATARACT EXTRACTION W/PHACO Right 10/01/2017  ? Procedure: CATARACT EXTRACTION PHACO AND INTRAOCULAR LENS PLACEMENT (IOC) RIGHT;  Surgeon: Lockie Mola, MD;  Location: [facility];  Service: Ophthalmology;  Laterality: Right;  ? COLONOSCOPY WITH PROPOFOL N/A 11/27/2016  ? Procedure: COLONOSCOPY WITH PROPOFOL;  Surgeon: Toledo, Boykin Nearing, MD;  Location: [facility];  Service: Gastroenterology;  Laterality: N/A;  ? CORONARY ANGIOPLASTY WITH STENT PLACEMENT    ? KNEE ARTHROPLASTY Left 01/22/2017  ? Procedure: COMPUTER ASSISTED TOTAL KNEE ARTHROPLASTY;  Surgeon: Donato Heinz, MD;  Location: [facility];  Service: Orthopedics;  Laterality: Left;  ? KNEE ARTHROSCOPY Left 02/01/2015  ? Procedure: ARTHROSCOPY KNEE, partial medial & lateral menisectomy,,medial and patelofemoral chondroplasty;  Surgeon: Donato Heinz, MD;  Location: [facility];  Service: Orthopedics;  Laterality: Left;  ? LITHOTRIPSY Right 1995  ? RIGHT AXILLARY LIPOMA REMOVAL    ? TUBAL LIGATION    ? VARICOSE VEIN SURGERY    ? ? ?There were no vitals filed for this visit. ? ? Subjective Assessment - 04/17/21 1108   ? ? Subjective "I tried to!" (use  my loud voice)   ? Currently in Pain? Yes   ? Pain Score 5    ? Pain Location Knee   ? ?  ?  ? ?  ? ? ? ? ? ? ? ? ADULT SLP TREATMENT - 04/17/21 1222   ? ?  ? General Information  ? Behavior/Cognition Alert;Cooperative;Pleasant mood   ? HPI Patient is an 81 y.o. female with past medical history noted for Parkinson's disease (dx 2019), GERD, HTN, CAD, osteoarthritis referred by Dr. Sherryll Burger for LSVT LOUD evaluation and treatment. Pt known to SLP from prior OP MBS on 04/12/20 with findings of mild oral dysphagia. Regular diet and thin liquids was recommended. Per MD notes pt family noting hoarse, raspy vocal  quality, drooling.   ?  ? Cognitive-Linquistic Treatment  ? Treatment focused on Dysarthria;Patient/family/caregiver education   ? Skilled Treatment LSVT LOUD Daily tasks: Loud /a/ averaged 82 dB and 4.2 seconds with modified independence. Max F0 for pitch glides was 315 Hz (avg 82 dB) modified independence. Min F0 for low glides was 181 Hz (avg 79 dB) with modified independence. Functional phrases averaged 76 dB with modified independence. Hierarchical loudness at conversation level averaged 72 dB with occasional min cues for louder speech.   ?  ? Assessment / Recommendations / Plan  ? Plan Continue with current plan of care   ?  ? Progression Toward Goals  ? Progression toward goals Progressing toward goals   ? ?  ?  ? ?  ? ? ? SLP Education - 04/17/21 1229   ? ? Education Details Her loud voice is appropriate conversation level   ? Person(s) Educated Patient   ? Methods Explanation   ? Comprehension Verbalized understanding   ? ?  ?  ? ?  ? ? ? SLP Short Term Goals - 04/11/21 1235   ? ?  ? SLP SHORT TERM GOAL #1  ? Title The patient will complete Daily Tasks (Maximum duration "ah", High/Lows, and Functional Phrases) at average loudness >/= 80 dB and with loud, good quality voice with min cues.   ? Time 10   ? Period --   visits  ? Status Achieved   ?  ? SLP SHORT TERM GOAL #2  ? Title The patient will complete Hierarchal Speech Loudness reading drills (words/phrases, sentences) at average >/= 75 dB and with loud, good quality voice with min cues.   ? Time 10   ? Period --   visits  ? Status Achieved   ?  ? SLP SHORT TERM GOAL #3  ? Title The patient will participate in 5-8 minutes conversation, maintaining average loudness of 75 dB and good quality voice with modified independence.   ? Time 10   ? Period --   visits  ? Status Achieved   ? ?  ?  ? ?  ? ? ? SLP Long Term Goals - 04/11/21 1235   ? ?  ? SLP LONG TERM GOAL #1  ? Title The patient will complete Daily Tasks (Maximum duration "ah", High/Lows, and  Functional Phrases) at average loudness >/= 80 dB and with loud, good quality voice.   ? Time 12   ? Period Weeks   ? Status On-going   ?  ? SLP LONG TERM GOAL #2  ? Title The patient will complete Hierarchal Speech Loudness reading drills (words/phrases, sentences, and paragraph) at average >/= 75 dB and with loud, good quality voice.   ? Time 12   ?  Period Weeks   ? Status On-going   ?  ? SLP LONG TERM GOAL #3  ? Title The patient will participate in 20 minutes conversation, maintaining average loudness of >/= 75 dB and loud, good quality voice.   ? Time 12   ? Period Weeks   ? Status On-going   ?  ? SLP LONG TERM GOAL #4  ? Title The patient will report improved communication effectiveness as measured by the Communication Effectiveness Survey.   ? Time 12   ? Period Weeks   ? Status On-going   ? ?  ?  ? ?  ? ? ? Plan - 04/17/21 1231   ? ? Clinical Impression Statement Patient is improving vocal quality and loudness in conversation and reports that she has received positive comments on her voice from multiple family members, including her brother who had a hard time hearing her on the phone previously. Pt continues to wear hearing aids to sessions. Continue LSVT LOUD to maximize pt's vocal quality and intelligibility for conversations with family and friends to reduce social isolation and improve quality of life.   ? Speech Therapy Frequency 4x / week   ? Duration 4 weeks   ? Treatment/Interventions Aspiration precaution training;Cueing hierarchy;SLP instruction and feedback;Functional tasks;Patient/family education;Other (comment)   LSVT LOUD  ? Potential to Achieve Goals Good   ? SLP Home Exercise Plan LSVT LOUD Daily tasks   ? Consulted and Agree with Plan of Care Patient   ? ?  ?  ? ?  ? ? ?Patient will benefit from skilled therapeutic intervention in order to improve the following deficits and impairments:   ?Dysarthria and anarthria ? ?Parkinson disease (HCC) ? ? ? ?Problem List ?Patient Active Problem List   ? Diagnosis Date Noted  ? Primary osteoarthritis of hip 02/09/2017  ? Primary osteoarthritis of knee 02/09/2017  ? Chronic sinus bradycardia 01/22/2017  ? Degenerative disc disease, lumbar 01/22/2017  ? Dizziness 01/02/2

## 2021-04-18 ENCOUNTER — Other Ambulatory Visit: Payer: Self-pay

## 2021-04-18 ENCOUNTER — Ambulatory Visit: Payer: Medicare Other | Admitting: Speech Pathology

## 2021-04-18 DIAGNOSIS — R471 Dysarthria and anarthria: Secondary | ICD-10-CM

## 2021-04-18 DIAGNOSIS — G20 Parkinson's disease: Secondary | ICD-10-CM

## 2021-04-18 NOTE — Therapy (Signed)
[place] [facility] ?[address] ?[place], [place], [address] ?Phone: [phone]   Fax:  [phone] ? ?Speech Language Pathology Treatment ? ?Patient Details  ?Name: Katelyn Crawford ?MRN: [id number] ?Date of Birth: May 15, 1940 ?Referring Provider (SLP): Dr. Cristopher Peru ? ? ?Encounter Date: 04/18/2021 ? ? End of Session - 04/18/21 1134   ? ? Visit Number 14   ? Number of Visits 17   ? Date for SLP Re-Evaluation 05/21/21   extended to allow for scheduling or missed visit make-ups if necessary  ? Authorization Type Medicare   ? Authorization - Visit Number 4   ? Progress Note Due on Visit 10   ? SLP Start Time 1103   ? SLP Stop Time  1203   ? SLP Time Calculation (min) 60 min   ? Activity Tolerance Patient tolerated treatment well   ? ?  ?  ? ?  ? ? ?Past Medical History:  ?Diagnosis Date  ? Arthritis   ? lower back  ? Cardiomyopathy (HCC)   ? with diastolic dysfunction  ? Chronic sinus bradycardia   ? Coronary artery disease 06/24/2013  ? 1. 03/10/12:Diffuse 90% stenosis mid right coronary artery treated medically because of inability to cannilate the vessel during cardiac catherization 03/10/2012  ? Dizziness   ? chronic  ? Environmental allergies   ? w/ allergic rhinitis  ? GERD (gastroesophageal reflux disease) 06/25/2013  ? H/O vertigo   ? Heart disease   ? History of degenerative disc disease   ? History of diverticulosis   ? History of kidney stones   ? History of seasonal allergies   ? Hyperlipidemia, unspecified   ? Hypertension   ? Nephrolithiasis   ? Non-cardiac chest pain   ? Osteoporosis, post-menopausal   ? Parkinson disease (HCC)   ? PVC's (premature ventricular contractions)   ? Vascular disease   ? Wears dentures   ? full upper and lower  ? ? ?Past Surgical History:  ?Procedure Laterality Date  ? APPENDECTOMY    ? BREAST EXCISIONAL BIOPSY Left 1984  ? neg  ? BREAST EXCISIONAL BIOPSY Right 1997  ? lipoma  ? CATARACT EXTRACTION W/PHACO Left 09/02/2017  ?  Procedure: CATARACT EXTRACTION PHACO AND INTRAOCULAR LENS PLACEMENT (IOC)  LEFT;  Surgeon: Lockie Mola, MD;  [facility]: [facility];  Service: Ophthalmology;  Laterality: Left;  ? CATARACT EXTRACTION W/PHACO Right 10/01/2017  ? Procedure: CATARACT EXTRACTION PHACO AND INTRAOCULAR LENS PLACEMENT ([facility]) RIGHT;  Surgeon: Lockie Mola, MD;  Location: [facility];  Service: Ophthalmology;  Laterality: Right;  ? COLONOSCOPY WITH PROPOFOL N/A 11/27/2016  ? Procedure: COLONOSCOPY WITH PROPOFOL;  Surgeon: Toledo, Boykin Nearing, MD;  Location: [facility];  Service: Gastroenterology;  Laterality: N/A;  ? CORONARY ANGIOPLASTY WITH STENT PLACEMENT    ? KNEE ARTHROPLASTY Left 01/22/2017  ? Procedure: COMPUTER ASSISTED TOTAL KNEE ARTHROPLASTY;  Surgeon: Donato Heinz, MD;  Location: [facility];  Service: Orthopedics;  Laterality: Left;  ? KNEE ARTHROSCOPY Left 02/01/2015  ? Procedure: ARTHROSCOPY KNEE, partial medial & lateral menisectomy,,medial and patelofemoral chondroplasty;  Surgeon: Donato Heinz, MD;  Location: [facility];  Service: Orthopedics;  Laterality: Left;  ? LITHOTRIPSY Right 1995  ? RIGHT AXILLARY LIPOMA REMOVAL    ? TUBAL LIGATION    ? VARICOSE VEIN SURGERY    ? ? ?There were no vitals filed for this visit. ? ? Subjective Assessment - 04/18/21 1109   ? ? Subjective Reports talking on the  phone a lot yesterday   ? Currently in Pain? Yes   ? Pain Score 5    ? Pain Location Knee   ? Pain Orientation Right;Posterior   ? Pain Descriptors / Indicators Aching   ? Pain Type Chronic pain   ? Pain Radiating Towards leg   ? Pain Onset More than a month ago   ? ?  ?  ? ?  ? ? ? ? ? ? ? ? ADULT SLP TREATMENT - 04/18/21 1111   ? ?  ? General Information  ? Behavior/Cognition Alert;Cooperative;Pleasant mood   ? HPI Patient is an 81 y.o. female with past medical history noted for Parkinson's disease (dx 2019), GERD, HTN, CAD, osteoarthritis referred by Dr. Sherryll Burger for LSVT LOUD evaluation and treatment. Pt  known to SLP from prior OP MBS on 04/12/20 with findings of mild oral dysphagia. Regular diet and thin liquids was recommended. Per MD notes pt family noting hoarse, raspy vocal quality, drooling.   ?  ? Cognitive-Linquistic Treatment  ? Treatment focused on Dysarthria;Patient/family/caregiver education   ? Skilled Treatment LSVT LOUD Daily tasks: Loud /a/ averaged 82 dB and 4.0 seconds with modified independence. Max F0 for pitch glides was 319 Hz (avg 84 dB) modified independence. Min F0 for low glides was 169 Hz (avg 78 dB) with modified independence. Functional phrases averaged 76 dB with modified independence. Hierarchical loudness at conversation level averaged 73 dB with rare min cues for louder speech.   ?  ? Assessment / Recommendations / Plan  ? Plan Continue with current plan of care   ?  ? Progression Toward Goals  ? Progression toward goals Progressing toward goals   ? ?  ?  ? ?  ? ? ? SLP Education - 04/18/21 1237   ? ? Education Details voice/speech changes related to Parkinson's   ? Person(s) Educated Patient   ? Methods Explanation   ? Comprehension Verbalized understanding   ? ?  ?  ? ?  ? ? ? SLP Short Term Goals - 04/11/21 1235   ? ?  ? SLP SHORT TERM GOAL #1  ? Title The patient will complete Daily Tasks (Maximum duration "ah", High/Lows, and Functional Phrases) at average loudness >/= 80 dB and with loud, good quality voice with min cues.   ? Time 10   ? Period --   visits  ? Status Achieved   ?  ? SLP SHORT TERM GOAL #2  ? Title The patient will complete Hierarchal Speech Loudness reading drills (words/phrases, sentences) at average >/= 75 dB and with loud, good quality voice with min cues.   ? Time 10   ? Period --   visits  ? Status Achieved   ?  ? SLP SHORT TERM GOAL #3  ? Title The patient will participate in 5-8 minutes conversation, maintaining average loudness of 75 dB and good quality voice with modified independence.   ? Time 10   ? Period --   visits  ? Status Achieved   ? ?  ?  ? ?   ? ? ? SLP Long Term Goals - 04/11/21 1235   ? ?  ? SLP LONG TERM GOAL #1  ? Title The patient will complete Daily Tasks (Maximum duration "ah", High/Lows, and Functional Phrases) at average loudness >/= 80 dB and with loud, good quality voice.   ? Time 12   ? Period Weeks   ? Status On-going   ?  ? SLP LONG  TERM GOAL #2  ? Title The patient will complete Hierarchal Speech Loudness reading drills (words/phrases, sentences, and paragraph) at average >/= 75 dB and with loud, good quality voice.   ? Time 12   ? Period Weeks   ? Status On-going   ?  ? SLP LONG TERM GOAL #3  ? Title The patient will participate in 20 minutes conversation, maintaining average loudness of >/= 75 dB and loud, good quality voice.   ? Time 12   ? Period Weeks   ? Status On-going   ?  ? SLP LONG TERM GOAL #4  ? Title The patient will report improved communication effectiveness as measured by the Communication Effectiveness Survey.   ? Time 12   ? Period Weeks   ? Status On-going   ? ?  ?  ? ?  ? ? ? Plan - 04/18/21 1135   ? ? Clinical Impression Statement Patient is improving vocal quality and loudness in conversation and reports that she has received positive comments on her voice from multiple family members. Was able to speak on the phone yesterday with several friends and family. Continues to habituate louder conversational speech, with min visual cues necessary today in conversation. Continue LSVT LOUD to maximize pt's vocal quality and intelligibility for conversations with family and friends to reduce social isolation and improve quality of life.   ? Speech Therapy Frequency 4x / week   ? Duration 4 weeks   ? Treatment/Interventions Aspiration precaution training;Cueing hierarchy;SLP instruction and feedback;Functional tasks;Patient/family education;Other (comment)   LSVT LOUD  ? Potential to Achieve Goals Good   ? SLP Home Exercise Plan LSVT LOUD Daily tasks   ? Consulted and Agree with Plan of Care Patient   ? ?  ?  ? ?  ? ? ?Patient  will benefit from skilled therapeutic intervention in order to improve the following deficits and impairments:   ?Dysarthria and anarthria ? ?Parkinson disease (HCC) ? ? ? ?Problem List ?Patient Active Proble

## 2021-04-19 ENCOUNTER — Ambulatory Visit: Payer: Medicare Other | Admitting: Speech Pathology

## 2021-04-19 DIAGNOSIS — R471 Dysarthria and anarthria: Secondary | ICD-10-CM | POA: Diagnosis not present

## 2021-04-19 DIAGNOSIS — G20 Parkinson's disease: Secondary | ICD-10-CM

## 2021-04-19 NOTE — Therapy (Signed)
[place] ?[facility] ?[place], [place], [address] ?Phone: [phone]   Fax:  [phone] ? ?Speech Language Pathology Treatment and Discharge Summary ? ?Patient Details  ?Name: Katelyn Crawford ?MRN: [id number] ?Date of Birth: 04/24/1940 ?Referring Provider (SLP): Dr. Jennings Books ? ? ?Encounter Date: 04/19/2021 ? ? End of Session - 04/19/21 1226   ? ? Visit Number 15   ? Number of Visits 17   ? Date for SLP Re-Evaluation 05/21/21   extended to allow for scheduling or missed visit make-ups if necessary  ? Authorization Type Medicare   ? Authorization - Visit Number 5   ? Progress Note Due on Visit 10   ? SLP Start Time 1100   ? SLP Stop Time  1200   ? SLP Time Calculation (min) 60 min   ? Activity Tolerance Patient tolerated treatment well   ? ?  ?  ? ?  ? ? ?Past Medical History:  ?Diagnosis Date  ? Arthritis   ? lower back  ? Cardiomyopathy ([place])   ? with diastolic dysfunction  ? Chronic sinus bradycardia   ? Coronary artery disease 06/24/2013  ? 1. 03/10/12:Diffuse 90% stenosis mid right coronary artery treated medically because of inability to cannilate the vessel during cardiac catherization 03/10/2012  ? Dizziness   ? chronic  ? Environmental allergies   ? w/ allergic rhinitis  ? GERD (gastroesophageal reflux disease) 06/25/2013  ? H/O vertigo   ? Heart disease   ? History of degenerative disc disease   ? History of diverticulosis   ? History of kidney stones   ? History of seasonal allergies   ? Hyperlipidemia, unspecified   ? Hypertension   ? Nephrolithiasis   ? Non-cardiac chest pain   ? Osteoporosis, post-menopausal   ? Parkinson disease ([place])   ? PVC's (premature ventricular contractions)   ? Vascular disease   ? Wears dentures   ? full upper and lower  ? ? ?Past Surgical History:  ?Procedure Laterality Date  ? APPENDECTOMY    ? BREAST EXCISIONAL BIOPSY Left 1984  ? neg  ? BREAST EXCISIONAL BIOPSY Right 1997  ? lipoma  ? CATARACT EXTRACTION W/PHACO  Left 09/02/2017  ? Procedure: CATARACT EXTRACTION PHACO AND INTRAOCULAR LENS PLACEMENT ([place])  LEFT;  Surgeon: Leandrew Koyanagi, MD;  Location: [place];  Service: Ophthalmology;  Laterality: Left;  ? CATARACT EXTRACTION W/PHACO Right 10/01/2017  ? Procedure: CATARACT EXTRACTION PHACO AND INTRAOCULAR LENS PLACEMENT ([place]) RIGHT;  Surgeon: Leandrew Koyanagi, MD;  Location: [place];  Service: Ophthalmology;  Laterality: Right;  ? COLONOSCOPY WITH PROPOFOL N/A 11/27/2016  ? Procedure: COLONOSCOPY WITH PROPOFOL;  Surgeon: Toledo, Benay Pike, MD;  Location: [facility];  Service: Gastroenterology;  Laterality: N/A;  ? CORONARY ANGIOPLASTY WITH STENT PLACEMENT    ? KNEE ARTHROPLASTY Left 01/22/2017  ? Procedure: COMPUTER ASSISTED TOTAL KNEE ARTHROPLASTY;  Surgeon: Dereck Leep, MD;  Location: [facility];  Service: Orthopedics;  Laterality: Left;  ? KNEE ARTHROSCOPY Left 02/01/2015  ? Procedure: ARTHROSCOPY KNEE, partial medial & lateral menisectomy,,medial and patelofemoral chondroplasty;  Surgeon: Dereck Leep, MD;  Location: [facility];  Service: Orthopedics;  Laterality: Left;  ? LITHOTRIPSY Right 1995  ? RIGHT AXILLARY LIPOMA REMOVAL    ? TUBAL LIGATION    ? VARICOSE VEIN SURGERY    ? ? ?There were no vitals filed for this visit. ? ? Subjective Assessment - 04/19/21 1220   ? ? Subjective "My  neighbor said she can tell a difference."   ? Currently in Pain? Yes   ? Pain Score 5    ? Pain Location Knee   ? ?  ?  ? ?  ? ? ? ? ? ? ? ? ADULT SLP TREATMENT - 04/19/21 1221   ? ?  ? General Information  ? Behavior/Cognition Alert;Cooperative;Pleasant mood   ? HPI Patient is an 81 y.o. female with past medical history noted for Parkinson's disease (dx 2019), GERD, HTN, CAD, osteoarthritis referred by Dr. Manuella Ghazi for LSVT LOUD evaluation and treatment. Pt known to SLP from prior OP MBS on 04/12/20 with findings of mild oral dysphagia. Regular diet and thin liquids was recommended. Per MD notes pt family  noting hoarse, raspy vocal quality, drooling.   ?  ? Cognitive-Linquistic Treatment  ? Treatment focused on Dysarthria;Patient/family/caregiver education   ? Skilled Treatment LSVT LOUD Daily tasks: Loud /a/ averaged 82.5 dB and 5.0 seconds with modified independence. Max F0 for pitch glides was 324 Hz (avg 78 dB) modified independence. Min F0 for low glides was 170 Hz (avg 78 dB) with modified independence. Functional phrases averaged 76 dB with modified independence. Hierarchical loudness at conversation level averaged 75 dB, with pt independently maintaining vocal quality, loudness. Reviewed maintenance program for LSVT.   ?  ? Assessment / Recommendations / Plan  ? Plan All goals met;Discharge SLP treatment due to (comment)   ?  ? Progression Toward Goals  ? Progression toward goals Goals met, education completed, patient discharged from SLP   ? ?  ?  ? ?  ? ? ? SLP Education - 04/19/21 1226   ? ? Education Details maintenance HEP   ? Person(s) Educated Patient   ? Methods Explanation;Handout   ? Comprehension Verbalized understanding   ? ?  ?  ? ?  ? ? ? SLP Short Term Goals - 04/19/21 1341   ? ?  ? SLP SHORT TERM GOAL #1  ? Title The patient will complete Daily Tasks (Maximum duration "ah", High/Lows, and Functional Phrases) at average loudness >/= 80 dB and with loud, good quality voice with min cues.   ? Time 10   ? Period --   visits  ? Status Achieved   ?  ? SLP SHORT TERM GOAL #2  ? Title The patient will complete Hierarchal Speech Loudness reading drills (words/phrases, sentences) at average >/= 75 dB and with loud, good quality voice with min cues.   ? Time 10   ? Period --   visits  ? Status Achieved   ?  ? SLP SHORT TERM GOAL #3  ? Title The patient will participate in 5-8 minutes conversation, maintaining average loudness of 75 dB and good quality voice with modified independence.   ? Time 10   ? Period --   visits  ? Status Achieved   ? ?  ?  ? ?  ? ? ? SLP Long Term Goals - 04/19/21 1339   ? ?  ?  SLP LONG TERM GOAL #1  ? Title The patient will complete Daily Tasks (Maximum duration "ah", High/Lows, and Functional Phrases) at average loudness >/= 80 dB and with loud, good quality voice.   ? Time 12   ? Period Weeks   ? Status Achieved   ?  ? SLP LONG TERM GOAL #2  ? Title The patient will complete Hierarchal Speech Loudness reading drills (words/phrases, sentences, and paragraph) at average >/= 75 dB and with  loud, good quality voice.   ? Time 12   ? Period Weeks   ? Status Achieved   ?  ? SLP LONG TERM GOAL #3  ? Title The patient will participate in 20 minutes conversation, maintaining average loudness of >/= 75 dB and loud, good quality voice.   ? Time 12   ? Period Weeks   ? Status Achieved   ?  ? SLP LONG TERM GOAL #4  ? Title The patient will report improved communication effectiveness as measured by the Communication Effectiveness Survey.   ? Baseline Improved from 18/32 at eval to 22/32 on 04/19/21   ? Time 12   ? Period Weeks   ? Status Achieved   ? ?  ?  ? ?  ? ? ? Plan - 04/19/21 1342   ? ? Clinical Impression Statement Patient demonstrates carryover of vocal quality and loudness in conversation, and reports friends, family and her neighbors have commented on her improved voice. Pt listened to a comparison of her voice pre and post-LSVT with paragraph reading and remarked, "It doesn't sound like the same person." Improvements noted with vocal quality, loudness, and pitch variations. Patient maintained loudness and vocal quality without cues during 25 minutes conversation averaging 75 dB. Pt in agreement with d/c today with LTGs met.   ? Speech Therapy Frequency --   d/c  ? Duration --   d/c  ? Treatment/Interventions Aspiration precaution training;Cueing hierarchy;SLP instruction and feedback;Functional tasks;Patient/family education;Other (comment)   LSVT LOUD  ? Potential to Achieve Goals Good   ? SLP Home Exercise Plan LSVT LOUD maintenance program   ? Consulted and Agree with Plan of Care  Patient   ? ?  ?  ? ?  ? ? ?Patient will benefit from skilled therapeutic intervention in order to improve the following deficits and impairments:   ?No diagnosis found. ? ? ? ?Problem List ?Patient Active Problem

## 2021-09-14 ENCOUNTER — Other Ambulatory Visit: Payer: Self-pay | Admitting: Internal Medicine

## 2021-09-14 DIAGNOSIS — Z1231 Encounter for screening mammogram for malignant neoplasm of breast: Secondary | ICD-10-CM

## 2021-10-01 ENCOUNTER — Emergency Department: Payer: Medicare Other

## 2021-10-01 ENCOUNTER — Other Ambulatory Visit: Payer: Self-pay

## 2021-10-01 ENCOUNTER — Emergency Department
Admission: EM | Admit: 2021-10-01 | Discharge: 2021-10-01 | Disposition: A | Discharge status: Home / Self Care | Payer: Medicare Other | Attending: Emergency Medicine | Admitting: Emergency Medicine

## 2021-10-01 DIAGNOSIS — N182 Chronic kidney disease, stage 2 (mild): Secondary | ICD-10-CM | POA: Insufficient documentation

## 2021-10-01 DIAGNOSIS — I2510 Atherosclerotic heart disease of native coronary artery without angina pectoris: Secondary | ICD-10-CM | POA: Diagnosis not present

## 2021-10-01 DIAGNOSIS — R079 Chest pain, unspecified: Secondary | ICD-10-CM | POA: Diagnosis present

## 2021-10-01 DIAGNOSIS — I10 Essential (primary) hypertension: Secondary | ICD-10-CM

## 2021-10-01 DIAGNOSIS — Z87442 Personal history of urinary calculi: Secondary | ICD-10-CM | POA: Diagnosis not present

## 2021-10-01 DIAGNOSIS — I129 Hypertensive chronic kidney disease with stage 1 through stage 4 chronic kidney disease, or unspecified chronic kidney disease: Secondary | ICD-10-CM | POA: Diagnosis not present

## 2021-10-01 DIAGNOSIS — M546 Pain in thoracic spine: Secondary | ICD-10-CM | POA: Insufficient documentation

## 2021-10-01 DIAGNOSIS — G20 Parkinson's disease: Secondary | ICD-10-CM | POA: Insufficient documentation

## 2021-10-01 LAB — CBC WITH DIFFERENTIAL/PLATELET
Abs Immature Granulocytes: 0.01 10*3/uL (ref 0.00–0.07)
Basophils Absolute: 0.1 10*3/uL (ref 0.0–0.1)
Basophils Relative: 1 %
Eosinophils Absolute: 0.2 10*3/uL (ref 0.0–0.5)
Eosinophils Relative: 3 %
HCT: 36.8 % (ref 36.0–46.0)
Hemoglobin: 12 g/dL (ref 12.0–15.0)
Immature Granulocytes: 0 %
Lymphocytes Relative: 36 %
Lymphs Abs: 2 10*3/uL (ref 0.7–4.0)
MCH: 29.1 pg (ref 26.0–34.0)
MCHC: 32.6 g/dL (ref 30.0–36.0)
MCV: 89.1 fL (ref 80.0–100.0)
Monocytes Absolute: 0.6 10*3/uL (ref 0.1–1.0)
Monocytes Relative: 10 %
Neutro Abs: 2.7 10*3/uL (ref 1.7–7.7)
Neutrophils Relative %: 50 %
Platelets: 169 10*3/uL (ref 150–400)
RBC: 4.13 MIL/uL (ref 3.87–5.11)
RDW: 14.3 % (ref 11.5–15.5)
WBC: 5.6 10*3/uL (ref 4.0–10.5)
nRBC: 0 % (ref 0.0–0.2)

## 2021-10-01 LAB — TROPONIN I (HIGH SENSITIVITY)
Troponin I (High Sensitivity): 8 ng/L (ref <18)
Troponin I (High Sensitivity): 14 ng/L (ref <18)

## 2021-10-01 LAB — COMPREHENSIVE METABOLIC PANEL
ALT: 17 U/L (ref 0–44)
AST: 29 U/L (ref 15–41)
Albumin: 4.6 g/dL (ref 3.5–5.0)
Alkaline Phosphatase: 78 U/L (ref 38–126)
Anion gap: 11 (ref 5–15)
BUN: 16 mg/dL (ref 8–23)
CO2: 26 mmol/L (ref 22–32)
Calcium: 9.9 mg/dL (ref 8.9–10.3)
Chloride: 99 mmol/L (ref 98–111)
Creatinine, Ser: 0.86 mg/dL (ref 0.44–1.00)
GFR, Estimated: >60 mL/min (ref >60)
Glucose, Bld: 106 mg/dL — ABNORMAL HIGH (ref 70–99)
Potassium: 4.4 mmol/L (ref 3.5–5.1)
Sodium: 136 mmol/L (ref 135–145)
Total Bilirubin: 0.7 mg/dL (ref 0.3–1.2)
Total Protein: 7.4 g/dL (ref 6.5–8.1)

## 2021-10-01 LAB — URINALYSIS, ROUTINE W REFLEX MICROSCOPIC
Bilirubin Urine: NEGATIVE
Glucose, UA: NEGATIVE mg/dL
Hgb urine dipstick: NEGATIVE
Ketones, ur: NEGATIVE mg/dL
Leukocytes,Ua: NEGATIVE
Nitrite: NEGATIVE
Protein, ur: NEGATIVE mg/dL
Specific Gravity, Urine: 1.004 — ABNORMAL LOW (ref 1.005–1.030)
pH: 7 (ref 5.0–8.0)

## 2021-10-01 MED ORDER — LIDOCAINE 5 % EX PTCH: 1.0000 | MEDICATED_PATCH | CUTANEOUS | 0 refills | Status: DC

## 2021-10-01 MED ORDER — MORPHINE SULFATE (PF) 4 MG/ML IV SOLN
4.0000 mg | Freq: Once | INTRAVENOUS | Status: AC
  Administered 2021-10-01: 4 mg via INTRAVENOUS
  Filled 2021-10-01: qty 1

## 2021-10-01 MED ORDER — IOHEXOL 350 MG/ML SOLN
100.0000 mL | Freq: Once | INTRAVENOUS | Status: AC | PRN
  Administered 2021-10-01: 100 mL via INTRAVENOUS

## 2021-10-01 NOTE — Discharge Instructions (Addendum)
Your work-up was reassuring today.  Please follow-up with your cardiologist in the next several days.  If your chest pain is changing in someway or you develop new symptoms such as shortness of breath sweating or nausea please return to the emergency department.  You can use the Lidoderm patch for your back pain.  Please check your blood pressure once daily and keep a recording of this over the next week or so and follow-up with your primary doctor or cardiologist with these numbers.

## 2021-10-01 NOTE — ED Provider Notes (Signed)
[facility] Provider Note    Event Date/Time   First MD Initiated Contact with Patient 10/01/21 2005     (approximate)   History   Hypertension   HPI  Katelyn Crawford is a 81 y.o. female past medical history of coronary disease, diastolic heart failure, GERD presents with elevated blood pressure, chest pain and back pain.  Patient notes that she has had chronic upper back pain.  Felt worse today.  She is also having pain in the front of her chest.  She also notes that the chest pain she has had on and off but has been also worse today.  She checked her blood pressure and it was elevated in the 200s.  Typically runs 130s to 140s.  She takes losartan for it.  She has mild shortness of breath denies lower extremity swelling numbness tingling weakness or abdominal pain.  Has had stents.  Denies history of MI.  Her chest pain is not exertional nonradiating.  Denies associated nausea or diaphoresis.  No fevers chills or cough.     Past Medical History:  Diagnosis Date   Arthritis    lower back   Cardiomyopathy [facility])    with diastolic dysfunction   Chronic sinus bradycardia    Coronary artery disease 06/24/2013   1. 03/10/12:Diffuse 90% stenosis mid right coronary artery treated medically because of inability to cannilate the vessel during cardiac catherization 03/10/2012   Dizziness    chronic   Environmental allergies    w/ allergic rhinitis   GERD (gastroesophageal reflux disease) 06/25/2013   H/O vertigo    Heart disease    History of degenerative disc disease    History of diverticulosis    History of kidney stones    History of seasonal allergies    Hyperlipidemia, unspecified    Hypertension    Nephrolithiasis    Non-cardiac chest pain    Osteoporosis, post-menopausal    Parkinson disease (HCC)    PVC's (premature ventricular contractions)    Vascular disease    Wears dentures    full upper and lower    Patient Active Problem List   Diagnosis  Date Noted   Primary osteoarthritis of hip 02/09/2017   Primary osteoarthritis of knee 02/09/2017   Chronic sinus bradycardia 01/22/2017   Degenerative disc disease, lumbar 01/22/2017   Dizziness 01/22/2017   Nephrolithiasis 01/22/2017   Osteoporosis, 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/22/2017   PVC's (premature ventricular contractions) 01/22/2017   S/P total knee arthroplasty 01/22/2017   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   CKD (chronic kidney disease), 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   GERD (gastroesophageal reflux disease) 06/25/2013   Coronary artery disease involving native coronary artery of native heart 06/24/2013   Essential hypertension 06/21/2013   Mixed hyperlipidemia 06/21/2013     Physical Exam  Triage Vital Signs: ED Triage Vitals  Enc Vitals Group     BP 10/01/21 1859 (!) 202/91     Pulse Rate 10/01/21 1859 68     Resp 10/01/21 1859 18     Temp 10/01/21 1859 98.7 F (37.1 C)     Temp Source 10/01/21 1859 Oral     SpO2 10/01/21 1859 94 %     Weight 10/01/21 1900 154 lb (69.9 kg)     Height 10/01/21 1900 4\' 10"  (1.473 m)     Head Circumference --      Peak Flow --      Pain Score 10/01/21 1859 3     Pain Loc --  Pain Edu? --      Excl. in GC? --     Most recent vital signs: Vitals:   10/01/21 2030 10/01/21 2300  BP: (!) 206/89 (!) 190/76  Pulse: 69 60  Resp: 19 17  Temp:  98.3 F (36.8 C)  SpO2: 100% 96%     General: Awake, no distress.  CV:  Good peripheral perfusion.  2+ DP and radial pulses symmetric bilaterally Resp:  Normal effort.  Lungs are clear Abd:  No distention.  Abdomen soft nontender Neuro:             Awake, Alert, Oriented x 3  Other:  Mild tenderness to palpation diffusely in the mid thoracic region paraspinal musculature   ED Results / Procedures / Treatments  Labs (all labs ordered are listed, but only abnormal results are displayed) Labs Reviewed  COMPREHENSIVE METABOLIC PANEL - Abnormal; Notable for the following components:       Result Value   Glucose, Bld 106 (*)    All other components within normal limits  URINALYSIS, ROUTINE W REFLEX MICROSCOPIC - Abnormal; Notable for the following components:   Color, Urine YELLOW (*)    APPearance CLEAR (*)    Specific Gravity, Urine 1.004 (*)    All other components within normal limits  CBC WITH DIFFERENTIAL/PLATELET  TROPONIN I (HIGH SENSITIVITY)  TROPONIN I (HIGH SENSITIVITY)     EKG  EKG shows sinus rhythm with first-degree AV block, right bundle branch block, left anterior fascicular block, left axis deviation, no acute ischemic changes, similar to prior   RADIOLOGY I reviewed and interpreted the CXR which does not show any acute cardiopulmonary process    PROCEDURES:  Critical Care performed: No  .1-3 Lead EKG Interpretation  Performed by: Georga Hacking, MD Authorized by: Georga Hacking, MD     Interpretation: normal     ECG rate assessment: normal     Rhythm: sinus rhythm     Ectopy: none     Conduction: normal     The patient is on the cardiac monitor to evaluate for evidence of arrhythmia and/or significant heart rate changes.   MEDICATIONS ORDERED IN ED: Medications  morphine (PF) 4 MG/ML injection 4 mg (4 mg Intravenous Given 10/01/21 2153)  iohexol (OMNIPAQUE) 350 MG/ML injection 100 mL (100 mLs Intravenous Contrast Given 10/01/21 2207)     IMPRESSION / MDM / ASSESSMENT AND PLAN / ED COURSE  I reviewed the triage vital signs and the nursing notes.                              Patient's presentation is most consistent with acute presentation with potential threat to life or bodily function.  Differential diagnosis includes, but is not limited to, aortic dissection, ACS, pulmonary embolism, musculoskeletal pain  The patient is a 81 year old female with chronic thoracic back pain presents with both back and chest pain and elevated blood pressure.  Tells me that the back pain has been longstanding and it is in the thoracic area  bilateral not associate with numbness tingling weakness.  She is also had intermittent chest pain for several weeks.  Today both the chest and back pain has been more severe and she checked her blood pressure and it was elevated in the 200s systolic.  She takes losartan typically and blood pressure is relatively well controlled but she does not check it daily.  Denies abdominal pain vomiting. His blood  pressure is elevated in the 123456 systolic.  Her EKG is nonischemic.  Troponins x2 are negative.  Obtained a CT angio dissection study given elevated blood pressure and back and chest pain this does not show any acute dissection or aortic syndrome.  Patient given morphine for pain and symptoms did improve.  With her reassuring work-up the fact that the symptoms been going on for some time and are not new today I think that she is appropriate for discharge.  We discussed that she return to the ED for any chest pain that is changing.  She has an appoint with her cardiologist in 2 weeks and recommended that she call the office so that she can be seen sooner.  Recommended daily blood pressure check at home to trend see if she needs change in her medication.        FINAL CLINICAL IMPRESSION(S) / ED DIAGNOSES   Final diagnoses:  Chest pain, unspecified type  Midline thoracic back pain, unspecified chronicity  Essential hypertension     Rx / DC Orders   ED Discharge Orders          Ordered    lidocaine (LIDODERM) 5 %  Every 24 hours        10/01/21 2258             Note:  This document was prepared using Dragon voice recognition software and may include unintentional dictation errors.   Rada Hay, MD 10/02/21 [phone]

## 2021-10-01 NOTE — ED Triage Notes (Signed)
C/o Htn at home. 200s systolic. Hx of htn, taking Losartan, last dose this morning.  Also c/o chest tightness and upper back pain.

## 2021-10-24 ENCOUNTER — Other Ambulatory Visit: Payer: Self-pay | Admitting: Internal Medicine

## 2021-10-24 DIAGNOSIS — R1084 Generalized abdominal pain: Secondary | ICD-10-CM

## 2021-10-25 ENCOUNTER — Ambulatory Visit
Admission: RE | Admit: 2021-10-25 | Discharge: 2021-10-25 | Disposition: A | Discharge status: Home / Self Care | Payer: Medicare Other | Source: Ambulatory Visit | Attending: Internal Medicine | Admitting: Internal Medicine

## 2021-10-25 DIAGNOSIS — R1084 Generalized abdominal pain: Secondary | ICD-10-CM | POA: Diagnosis present

## 2021-10-31 ENCOUNTER — Other Ambulatory Visit: Payer: Self-pay | Admitting: Student

## 2021-10-31 DIAGNOSIS — M545 Low back pain, unspecified: Secondary | ICD-10-CM

## 2021-11-01 ENCOUNTER — Ambulatory Visit
Admission: RE | Admit: 2021-11-01 | Discharge: 2021-11-01 | Disposition: A | Discharge status: Home / Self Care | Payer: Medicare Other | Source: Ambulatory Visit | Attending: Internal Medicine | Admitting: Internal Medicine

## 2021-11-01 DIAGNOSIS — Z1231 Encounter for screening mammogram for malignant neoplasm of breast: Secondary | ICD-10-CM | POA: Insufficient documentation

## 2021-11-06 ENCOUNTER — Ambulatory Visit
Admission: RE | Admit: 2021-11-06 | Discharge: 2021-11-06 | Disposition: A | Discharge status: Home / Self Care | Payer: Medicare Other | Source: Ambulatory Visit | Attending: Student | Admitting: Student

## 2021-11-06 DIAGNOSIS — G8929 Other chronic pain: Secondary | ICD-10-CM | POA: Insufficient documentation

## 2021-11-06 DIAGNOSIS — M545 Low back pain, unspecified: Secondary | ICD-10-CM | POA: Diagnosis present

## 2021-12-05 ENCOUNTER — Ambulatory Visit: Payer: Self-pay | Admitting: General Surgery

## 2021-12-05 NOTE — H&P (View-Only) (Signed)
PATIENT PROFILE: Katelyn Crawford is a 81 y.o. female who presents to the Clinic for consultation at the request of Dr. Chauncey Mann for evaluation of cholelithiasis.  PCP:  Idelle Crouch, MD  HISTORY OF PRESENT ILLNESS: Ms. Auzenne reports she has been dealing with right-sided abdominal pain, back pain and nausea for the last few months.  This is aggravated by eating.  Pain radiates to the right abdomen and right upper back.  She cannot identify any alleviating factors.  Last episode was last night when she ate a greasy meal.  She denies any fever or chills.  She had an ultrasound that shows cholelithiasis without sign of cholecystitis.  I personally evaluated the images of the ultrasound.  Last labs shows no elevated AST, ALT or bilirubin levels.   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 July 2015 Cr 1.1, GFR 49 B. 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    02/2012: Stress Nuclear; EF 66%, achieved 85% PMHR, 6 minutes, no ischemia B.   03/10/12:  Cardiac Cath: Diffuse  90% stenosis mid right coronary artery treated medically because of inability to cannulate the vessel during cardiac catherization  C.   06/2013: Cardiac Cath: LM normal, LAD Prox 30%, Mid 40%; LCX 20%, Mid  RCA 70% distal 90%. S/p DES-RCA D. 07/2013 Stress nuclear: no infarct or ischemia. EF 76%. Peak HR: 103, Peak BP 163 / 73. Submax, on BB. Reproduced chest burning. 4.6 METS.       Essential hypertension (Chronic) 06/21/2013   Mixed hyperlipidemia (Chronic) 06/21/2013   Overview    A. 2015 TC 179, TG 192, HDL 50, LDL 91, VLDL 38 - Atorvastatin 49m daily B. 01/2014 TC 170, TG 145, HDL 48, LDL 92 atorvastatin 20 mg C. 07/2014: TC 159, TG 148, HDL 50, LDL 79 atorva 40 mg D. 02/2016 TC 152, TG 123, HDL 54, LDL 73 atorva 40 mg E. 09/2016 TC 173, TG 169, HDL 55, LDL 84, VLDL 34 - Lipitor 473mF. 03/2017 TC 159, TG 110, HDL 57, LDL 80 atorva 40 mg. May 2019 switch to rosuvastatin 20 mg G. 06/2018 TC 149, TG 121, HDL 60, LDL 64 rosuva 20 mg  H. 07/2019 TC 157, TG 132, HDL 60, LDL 70 rosuva 20 mg + fish oil I. 05/2020 TC 157, TG 125, HDL 70, LDL 62 rosuvastatin 20 mg 05/2021 TC 161, TG 110, HDL 72,  LDL 67 rosuvastatin 20 mg        GENERAL REVIEW OF SYSTEMS:   General ROS: negative for - chills, fatigue, fever, weight gain or weight loss Allergy and Immunology ROS: negative for - hives  Hematological and Lymphatic ROS: negative for - bleeding problems or bruising, negative for palpable nodes Endocrine ROS: negative for - heat or cold intolerance, hair changes Respiratory ROS: negative for - cough, shortness of breath or wheezing Cardiovascular ROS: no chest pain or palpitations GI ROS: Positive for nausea, vomiting, abdominal pain Musculoskeletal ROS: Positive for - joint swelling or muscle pain Neurological ROS: negative for - confusion, syncope Dermatological ROS: negative for pruritus and rash Psychiatric: negative for anxiety, depression, difficulty sleeping and memory loss  MEDICATIONS: Current  Outpatient Medications  Medication Sig Dispense Refill   acetaminophen (TYLENOL) 500 MG tablet Take 500 mg by mouth every 8 (eight) hours as needed for Pain     amLODIPine (NORVASC) 2.5 MG tablet Take 1 tablet (2.5 mg total) by mouth 2 (two) times daily 60 tablet 11   ascorbic acid, vitamin C, (VITAMIN C) 500 MG tablet Take 500 mg by mouth once daily.     aspirin 81 MG EC tablet Take 81 mg by mouth once daily.       CALCIUM-MAGNESIUM-ZINC ORAL Take 1 tablet by mouth once daily.     carbidopa-levodopa (SINEMET CR) 50-200 mg CR tablet Take 1 tablet by mouth at bedtime for 360 days 30 tablet 11   carbidopa-levodopa (SINEMET) 25-250 mg tablet Take 1 tablet by mouth 4 (four) times daily 120 tablet 11   cetirizine (ZYRTEC) 10 MG tablet Take 1 tablet by mouth nightly     cholecalciferol (VITAMIN D3) 1,000 unit capsule Take 1,000 Units by mouth once daily     fluticasone propionate (FLONASE) 50 mcg/actuation nasal spray PLACE 2 SPRAYS INTO BOTH NOSTRILS ONCE DAILY 16 g 11   hydrALAZINE (APRESOLINE) 25 MG tablet Take 1 tablet (25 mg total) by mouth once daily as needed (when SBP > 170) (Patient taking differently: Take 12.5 mg by mouth 2 (two) times daily) 30 tablet 1   losartan (COZAAR) 25 MG tablet Take 1 tablet (25 mg total) by mouth at bedtime 60 tablet 5   multivitamin tablet Take 1 tablet by mouth once daily.     nitroGLYcerin (NITROSTAT) 0.4 MG SL tablet PLACE 1 TABLET UNDER THE TONGUE EVERY 5 MINUTES AS NEEDED FOR CHEST PAIN **MAY TAKE UP TO 3 DOSES** 25 tablet 3   pantoprazole (PROTONIX) 40 MG DR tablet Take 1 tablet (40 mg total) by mouth 2 (two) times daily before meals 60 tablet 5   rosuvastatin (CRESTOR) 20 MG tablet TAKE 1 TABLET BY MOUTH DAILY 90 tablet 3   sertraline (ZOLOFT) 50 MG tablet TAKE 1 TABLET BY MOUTH AT BEDTIME 90 tablet 1   sucralfate (CARAFATE) 1 gram tablet Take 1 tablet (1 g total) by mouth 4 (four) times daily before meals and nightly 120 tablet 0   vitamin B complex TbER  Take 1 tablet by mouth once daily     No current facility-administered medications for this visit.    ALLERGIES: Penicillins and Sulfa (sulfonamide antibiotics)  PAST MEDICAL HISTORY: Past Medical History:  Diagnosis Date   CAD (coronary artery disease) 06/24/2013   1. 03/10/12:Diffuse 90% stenosis mid right coronary artery treated medically because of inability to cannilate the vessel during cardiac catherization 03/10/2012   Cardiomyopathy (CMS-HCC)    with diastolic dysfunction   Chronic sinus bradycardia  DDD (degenerative disc disease)    Depression    Dizziness    chronic   Environmental allergies    w/ allergic rhinitis   GERD (gastroesophageal reflux disease) 06/25/2013   Hyperlipidemia    Hypertension    Nephrolithiasis    Non-cardiac chest pain    Osteoporosis, post-menopausal    PVC's (premature ventricular contractions)    Tremor     PAST SURGICAL HISTORY: Past Surgical History:  Procedure Laterality Date   Left knee arthroscopy partial medial and lateral meniscectomies and chondroplasty   02/01/2015   Dr Marry Guan   COLONOSCOPY  11/27/2016   Hyperplastic colon polyp,  No repeat TKT   Left total knee arthroplasty using computer-assisted navigation  01/22/2017   Dr Marry Guan   APPENDECTOMY     CARDIAC CATHETERIZATION     COLONOSCOPY     CORONARY ANGIOPLASTY WITH STENT PLACEMENT     Lumpectomy     MUSCLE BIOPSY     Right Axillary Lipoma     removal     FAMILY HISTORY: Family History  Problem Relation Age of Onset   Stroke Mother    Migraines Mother    High blood pressure (Hypertension) Mother    Coronary Artery Disease (Blocked arteries around heart) Father    Tremor Father    High blood pressure (Hypertension) Father    High blood pressure (Hypertension) Sister    Coronary Artery Disease (Blocked arteries around heart) Sister    High blood pressure (Hypertension) Brother    Coronary Artery Disease (Blocked arteries around heart) Brother    Coronary  Artery Disease (Blocked arteries around heart) Son 58       STEMI w/ PCI   Myocardial Infarction (Heart attack) Son    Coronary Artery Disease (Blocked arteries around heart) Maternal Grandfather    Myocardial Infarction (Heart attack) Maternal Grandfather    Coronary Artery Disease (Blocked arteries around heart) Paternal Grandfather    Myocardial Infarction (Heart attack) Paternal Grandfather    Colon cancer Neg Hx    Colon polyps Neg Hx      SOCIAL HISTORY: Social History   Socioeconomic History   Marital status: Widowed   Number of children: 3   Years of education: 76  Occupational History   Occupation: Retired  Tobacco Use   Smoking status: Never   Smokeless tobacco: Never  Scientific laboratory technician Use: Never used  Substance and Sexual Activity   Alcohol use: Never    Alcohol/week: 0.0 standard drinks of alcohol   Drug use: No   Sexual activity: Defer    Partners: Male    PHYSICAL EXAM: Vitals:   12/05/21 0910  BP: 122/70  Pulse: 73   Body mass index is 31.61 kg/m. Weight: 64 kg (141 lb)   GENERAL: Alert, active, oriented x3  HEENT: Pupils equal reactive to light. Extraocular movements are intact. Sclera clear. Palpebral conjunctiva normal red color.Pharynx clear.  NECK: Supple with no palpable mass and no adenopathy.  LUNGS: Sound clear with no rales rhonchi or wheezes.  HEART: Regular rhythm S1 and S2 without murmur.  ABDOMEN: Soft and depressible, nontender with no palpable mass, no hepatomegaly.   EXTREMITIES: Well-developed well-nourished symmetrical with no dependent edema.  NEUROLOGICAL: Awake alert oriented, facial expression symmetrical, moving all extremities.  REVIEW OF DATA: I have reviewed the following data today: Office Visit on 10/24/2021  Component Date Value   WBC (White Blood Cell Co* 10/24/2021 6.8    RBC (Red Blood Cell Coun* 10/24/2021 3.88 (  L)    Hemoglobin 10/24/2021 11.6 (L)    Hematocrit 10/24/2021 34.4 (L)    MCV (Mean  Corpuscular Vo* 10/24/2021 88.7    MCH (Mean Corpuscular He* 10/24/2021 29.9    MCHC (Mean Corpuscular H* 10/24/2021 33.7    Platelet Count 10/24/2021 158    RDW-CV (Red Cell Distrib* 10/24/2021 13.7    MPV (Mean Platelet Volum* 10/24/2021 8.9 (L)    Neutrophils 10/24/2021 3.94    Lymphocytes 10/24/2021 1.96    Monocytes 10/24/2021 0.67    Eosinophils 10/24/2021 0.13    Basophils 10/24/2021 0.05    Neutrophil % 10/24/2021 58.4    Lymphocyte % 10/24/2021 29.0    Monocyte % 10/24/2021 9.9    Eosinophil % 10/24/2021 1.9    Basophil% 10/24/2021 0.7    Immature Granulocyte % 10/24/2021 0.1    Immature Granulocyte Cou* 10/24/2021 0.01    Glucose 10/24/2021 96    Sodium 10/24/2021 131 (L)    Potassium 10/24/2021 4.6    Chloride 10/24/2021 98    Carbon Dioxide (CO2) 10/24/2021 27.8    Urea Nitrogen (BUN) 10/24/2021 16    Creatinine 10/24/2021 1.0    Glomerular Filtration Ra* 10/24/2021 56 (L)    Calcium 10/24/2021 9.7    AST  10/24/2021 24    ALT  10/24/2021 10    Alk Phos (alkaline Phosp* 10/24/2021 74    Albumin 10/24/2021 4.5    Bilirubin, Total 10/24/2021 0.5    Protein, Total 10/24/2021 6.5    A/G Ratio 10/24/2021 2.3    Color 10/24/2021 Yellow    Clarity 10/24/2021 Clear    Specific Gravity 10/24/2021 1.009    pH, Urine 10/24/2021 6.5    Protein, Urinalysis 10/24/2021 Negative    Glucose, Urinalysis 10/24/2021 Negative    Ketones, Urinalysis 10/24/2021 Negative    Blood, Urinalysis 10/24/2021 Negative    Nitrite, Urinalysis 10/24/2021 Negative    Leukocyte Esterase, Urin* 10/24/2021 Trace (!)    Bilirubin, Urinalysis 10/24/2021 Negative    Urobilinogen, Urinalysis 10/24/2021 0.2    WBC, UA 10/24/2021 0    Red Blood Cells, Urinaly* 10/24/2021 0    Bacteria, Urinalysis 10/24/2021 0-5    Squamous Epithelial Cell* 10/24/2021 1    Lipase 10/24/2021 30    Hemoglobin A1C 10/24/2021 5.8 (H)    Average Blood Glucose (C* 10/24/2021 120    H pylori Breath Test - L* 10/25/2021  Negative   Office Visit on 10/23/2021  Component Date Value   See Scanned Result 10/23/2021 See Results    AChR Binding Abs, Serum * 10/23/2021 <0.03    AChR Blocking Abs, Serum* 10/23/2021 14    AChR Modulating Ab - Lab* 10/23/2021 0    Anti-striation Abs - Lab* 10/23/2021 Negative    Reflex Information - Lab* 10/23/2021 Comment    MuSK Antibody - [facility] 10/23/2021 <1.0   Office Visit on 09/13/2021  Component Date Value   WBC (White Blood Cell Co* 09/13/2021 7.1    RBC (Red Blood Cell Coun* 09/13/2021 4.07    Hemoglobin 09/13/2021 12.0    Hematocrit 09/13/2021 37.0    MCV (Mean Corpuscular Vo* 09/13/2021 90.9    MCH (Mean Corpuscular He* 09/13/2021 29.5    MCHC (Mean Corpuscular H* 09/13/2021 32.4    Platelet Count 09/13/2021 168    RDW-CV (Red Cell Distrib* 09/13/2021 14.0    MPV (Mean Platelet Volum* 09/13/2021 9.2 (L)    Neutrophils 09/13/2021 4.32    Lymphocytes 09/13/2021 1.80    Monocytes 09/13/2021 0.66  Eosinophils 09/13/2021 0.19    Basophils 09/13/2021 0.08    Neutrophil % 09/13/2021 61.3    Lymphocyte % 09/13/2021 25.5    Monocyte % 09/13/2021 9.3    Eosinophil % 09/13/2021 2.7    Basophil% 09/13/2021 1.1    Immature Granulocyte % 09/13/2021 0.1    Immature Granulocyte Cou* 09/13/2021 0.01    Glucose 09/13/2021 80    Sodium 09/13/2021 133 (L)    Potassium 09/13/2021 4.6    Chloride 09/13/2021 99    Carbon Dioxide (CO2) 09/13/2021 30.5    Urea Nitrogen (BUN) 09/13/2021 18    Creatinine 09/13/2021 1.0    Glomerular Filtration Ra* 09/13/2021 53 (L)    Calcium 09/13/2021 9.9    AST  09/13/2021 24    ALT  09/13/2021 13    Alk Phos (alkaline Phosp* 09/13/2021 86    Albumin 09/13/2021 4.7    Bilirubin, Total 09/13/2021 0.4    Protein, Total 09/13/2021 6.7    A/G Ratio 09/13/2021 2.4    Color 09/13/2021 Yellow    Clarity 09/13/2021 Clear    Specific Gravity 09/13/2021 1.011    pH, Urine 09/13/2021 7.0    Protein, Urinalysis 09/13/2021 Negative    Glucose,  Urinalysis 09/13/2021 Negative    Ketones, Urinalysis 09/13/2021 Negative    Blood, Urinalysis 09/13/2021 Negative    Nitrite, Urinalysis 09/13/2021 Negative    Leukocyte Esterase, Urin* 09/13/2021 1+ (!)    Bilirubin, Urinalysis 09/13/2021 Negative    Urobilinogen, Urinalysis 09/13/2021 0.2    WBC, UA 09/13/2021 5    Red Blood Cells, Urinaly* 09/13/2021 2    Bacteria, Urinalysis 09/13/2021 0-5    Squamous Epithelial Cell* 09/13/2021 3      ASSESSMENT: Ms. Coburn is a 81 y.o. female presenting for consultation for cholelithiasis.    Patient was oriented about the diagnosis of cholelithiasis. Also oriented about what is the gallbladder, its anatomy and function and the implications of having stones. The patient was oriented about the treatment alternatives (observation vs cholecystectomy). Patient was oriented that a low percentage of patient will continue to have similar pain symptoms even after the gallbladder is removed. Surgical technique (open vs laparoscopic) was discussed. It was also discussed the goals of the surgery (decrease the pain episodes and avoid the risk of cholecystitis) and the risk of surgery including: bleeding, infection, common bile duct injury, stone retention, injury to other organs such as bowel, liver, stomach, other complications such as hernia, bowel obstruction among others. Also discussed with patient about anesthesia and its complications such as: reaction to medications, pneumonia, heart complications, death, among others.   Discussed with patient that I think that her symptoms are multifactorial.  I think that she has symptomatic cholelithiasis in combination with.  Chronic back pain.  I think that she should get significantly improved specially the nausea on the right-sided abdominal pain.  I discussed with patient that she may need further evaluation of her back especially for the symptoms of burning that goes to the right upper extremities.  The patient reports  she understood and agreed to proceed with surgery.  Cholelithiasis without cholecystitis [K80.20]  PLAN: 1.  Robotic assisted laparoscopic cholecystectomy (16109) 2.  CBC, CMP (done) 3.  Do not take aspirin 5 days before the procedure 4.  Contact us if has any question or concern.   Patient verbalized understanding, all questions were answered, and were agreeable with the plan outlined above.     Herbert Pun, MD  Electronically signed by Herbert Pun, MD

## 2021-12-05 NOTE — H&P (Signed)
PATIENT PROFILE: Katelyn Crawford is a 81 y.o. female who presents to the Clinic for consultation at the request of Dr. Chauncey Mann for evaluation of cholelithiasis.  PCP:  Idelle Crouch, MD  HISTORY OF PRESENT ILLNESS: Katelyn Crawford reports she has been dealing with right-sided abdominal pain, back pain and nausea for the last few months.  This is aggravated by eating.  Pain radiates to the right abdomen and right upper back.  She cannot identify any alleviating factors.  Last episode was last night when she ate a greasy meal.  She denies any fever or chills.  She had an ultrasound that shows cholelithiasis without sign of cholecystitis.  I personally evaluated the images of the ultrasound.  Last labs shows no elevated AST, ALT or bilirubin levels.   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 July 2015 Cr 1.1, GFR 49 B. 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    02/2012: Stress Nuclear; EF 66%, achieved 85% PMHR, 6 minutes, no ischemia B.   03/10/12:  Cardiac Cath: Diffuse  90% stenosis mid right coronary artery treated medically because of inability to cannulate the vessel during cardiac catherization  C.   06/2013: Cardiac Cath: LM normal, LAD Prox 30%, Mid 40%; LCX 20%, Mid  RCA 70% distal 90%. S/p DES-RCA D. 07/2013 Stress nuclear: no infarct or ischemia. EF 76%. Peak HR: 103, Peak BP 163 / 73. Submax, on BB. Reproduced chest burning. 4.6 METS.       Essential hypertension (Chronic) 06/21/2013   Mixed hyperlipidemia (Chronic) 06/21/2013   Overview    A. 2015 TC 179, TG 192, HDL 50, LDL 91, VLDL 38 - Atorvastatin 53m daily B. 01/2014 TC 170, TG 145, HDL 48, LDL 92 atorvastatin 20 mg C. 07/2014: TC 159, TG 148, HDL 50, LDL 79 atorva 40 mg D. 02/2016 TC 152, TG 123, HDL 54, LDL 73 atorva 40 mg E. 09/2016 TC 173, TG 169, HDL 55, LDL 84, VLDL 34 - Lipitor 469mF. 03/2017 TC 159, TG 110, HDL 57, LDL 80 atorva 40 mg. May 2019 switch to rosuvastatin 20 mg G. 06/2018 TC 149, TG 121, HDL 60, LDL 64 rosuva 20 mg  H. 07/2019 TC 157, TG 132, HDL 60, LDL 70 rosuva 20 mg + fish oil I. 05/2020 TC 157, TG 125, HDL 70, LDL 62 rosuvastatin 20 mg 05/2021 TC 161, TG 110, HDL 72,  LDL 67 rosuvastatin 20 mg        GENERAL REVIEW OF SYSTEMS:   General ROS: negative for - chills, fatigue, fever, weight gain or weight loss Allergy and Immunology ROS: negative for - hives  Hematological and Lymphatic ROS: negative for - bleeding problems or bruising, negative for palpable nodes Endocrine ROS: negative for - heat or cold intolerance, hair changes Respiratory ROS: negative for - cough, shortness of breath or wheezing Cardiovascular ROS: no chest pain or palpitations GI ROS: Positive for nausea, vomiting, abdominal pain Musculoskeletal ROS: Positive for - joint swelling or muscle pain Neurological ROS: negative for - confusion, syncope Dermatological ROS: negative for pruritus and rash Psychiatric: negative for anxiety, depression, difficulty sleeping and memory loss  MEDICATIONS: Current  Outpatient Medications  Medication Sig Dispense Refill   acetaminophen (TYLENOL) 500 MG tablet Take 500 mg by mouth every 8 (eight) hours as needed for Pain     amLODIPine (NORVASC) 2.5 MG tablet Take 1 tablet (2.5 mg total) by mouth 2 (two) times daily 60 tablet 11   ascorbic acid, vitamin C, (VITAMIN C) 500 MG tablet Take 500 mg by mouth once daily.     aspirin 81 MG EC tablet Take 81 mg by mouth once daily.       CALCIUM-MAGNESIUM-ZINC ORAL Take 1 tablet by mouth once daily.     carbidopa-levodopa (SINEMET CR) 50-200 mg CR tablet Take 1 tablet by mouth at bedtime for 360 days 30 tablet 11   carbidopa-levodopa (SINEMET) 25-250 mg tablet Take 1 tablet by mouth 4 (four) times daily 120 tablet 11   cetirizine (ZYRTEC) 10 MG tablet Take 1 tablet by mouth nightly     cholecalciferol (VITAMIN D3) 1,000 unit capsule Take 1,000 Units by mouth once daily     fluticasone propionate (FLONASE) 50 mcg/actuation nasal spray PLACE 2 SPRAYS INTO BOTH NOSTRILS ONCE DAILY 16 g 11   hydrALAZINE (APRESOLINE) 25 MG tablet Take 1 tablet (25 mg total) by mouth once daily as needed (when SBP > 170) (Patient taking differently: Take 12.5 mg by mouth 2 (two) times daily) 30 tablet 1   losartan (COZAAR) 25 MG tablet Take 1 tablet (25 mg total) by mouth at bedtime 60 tablet 5   multivitamin tablet Take 1 tablet by mouth once daily.     nitroGLYcerin (NITROSTAT) 0.4 MG SL tablet PLACE 1 TABLET UNDER THE TONGUE EVERY 5 MINUTES AS NEEDED FOR CHEST PAIN **MAY TAKE UP TO 3 DOSES** 25 tablet 3   pantoprazole (PROTONIX) 40 MG DR tablet Take 1 tablet (40 mg total) by mouth 2 (two) times daily before meals 60 tablet 5   rosuvastatin (CRESTOR) 20 MG tablet TAKE 1 TABLET BY MOUTH DAILY 90 tablet 3   sertraline (ZOLOFT) 50 MG tablet TAKE 1 TABLET BY MOUTH AT BEDTIME 90 tablet 1   sucralfate (CARAFATE) 1 gram tablet Take 1 tablet (1 g total) by mouth 4 (four) times daily before meals and nightly 120 tablet 0   vitamin B complex TbER  Take 1 tablet by mouth once daily     No current facility-administered medications for this visit.    ALLERGIES: Penicillins and Sulfa (sulfonamide antibiotics)  PAST MEDICAL HISTORY: Past Medical History:  Diagnosis Date   CAD (coronary artery disease) 06/24/2013   1. 03/10/12:Diffuse 90% stenosis mid right coronary artery treated medically because of inability to cannilate the vessel during cardiac catherization 03/10/2012   Cardiomyopathy (CMS-HCC)    with diastolic dysfunction   Chronic sinus bradycardia  DDD (degenerative disc disease)    Depression    Dizziness    chronic   Environmental allergies    w/ allergic rhinitis   GERD (gastroesophageal reflux disease) 06/25/2013   Hyperlipidemia    Hypertension    Nephrolithiasis    Non-cardiac chest pain    Osteoporosis, post-menopausal    PVC's (premature ventricular contractions)    Tremor     PAST SURGICAL HISTORY: Past Surgical History:  Procedure Laterality Date   Left knee arthroscopy partial medial and lateral meniscectomies and chondroplasty   02/01/2015   Dr Marry Guan   COLONOSCOPY  11/27/2016   Hyperplastic colon polyp,  No repeat TKT   Left total knee arthroplasty using computer-assisted navigation  01/22/2017   Dr Marry Guan   APPENDECTOMY     CARDIAC CATHETERIZATION     COLONOSCOPY     CORONARY ANGIOPLASTY WITH STENT PLACEMENT     Lumpectomy     MUSCLE BIOPSY     Right Axillary Lipoma     removal     FAMILY HISTORY: Family History  Problem Relation Age of Onset   Stroke Mother    Migraines Mother    High blood pressure (Hypertension) Mother    Coronary Artery Disease (Blocked arteries around heart) Father    Tremor Father    High blood pressure (Hypertension) Father    High blood pressure (Hypertension) Sister    Coronary Artery Disease (Blocked arteries around heart) Sister    High blood pressure (Hypertension) Brother    Coronary Artery Disease (Blocked arteries around heart) Brother    Coronary  Artery Disease (Blocked arteries around heart) Son 30       STEMI w/ PCI   Myocardial Infarction (Heart attack) Son    Coronary Artery Disease (Blocked arteries around heart) Maternal Grandfather    Myocardial Infarction (Heart attack) Maternal Grandfather    Coronary Artery Disease (Blocked arteries around heart) Paternal Grandfather    Myocardial Infarction (Heart attack) Paternal Grandfather    Colon cancer Neg Hx    Colon polyps Neg Hx      SOCIAL HISTORY: Social History   Socioeconomic History   Marital status: Widowed   Number of children: 3   Years of education: 50  Occupational History   Occupation: Retired  Tobacco Use   Smoking status: Never   Smokeless tobacco: Never  Scientific laboratory technician Use: Never used  Substance and Sexual Activity   Alcohol use: Never    Alcohol/week: 0.0 standard drinks of alcohol   Drug use: No   Sexual activity: Defer    Partners: Male    PHYSICAL EXAM: Vitals:   12/05/21 0910  BP: 122/70  Pulse: 73   Body mass index is 31.61 kg/m. Weight: 64 kg (141 lb)   GENERAL: Alert, active, oriented x3  HEENT: Pupils equal reactive to light. Extraocular movements are intact. Sclera clear. Palpebral conjunctiva normal red color.Pharynx clear.  NECK: Supple with no palpable mass and no adenopathy.  LUNGS: Sound clear with no rales rhonchi or wheezes.  HEART: Regular rhythm S1 and S2 without murmur.  ABDOMEN: Soft and depressible, nontender with no palpable mass, no hepatomegaly.   EXTREMITIES: Well-developed well-nourished symmetrical with no dependent edema.  NEUROLOGICAL: Awake alert oriented, facial expression symmetrical, moving all extremities.  REVIEW OF DATA: I have reviewed the following data today: Office Visit on 10/24/2021  Component Date Value   WBC (White Blood Cell Co* 10/24/2021 6.8    RBC (Red Blood Cell Coun* 10/24/2021 3.88 (  L)    Hemoglobin 10/24/2021 11.6 (L)    Hematocrit 10/24/2021 34.4 (L)    MCV (Mean  Corpuscular Vo* 10/24/2021 88.7    MCH (Mean Corpuscular He* 10/24/2021 29.9    MCHC (Mean Corpuscular H* 10/24/2021 33.7    Platelet Count 10/24/2021 158    RDW-CV (Red Cell Distrib* 10/24/2021 13.7    MPV (Mean Platelet Volum* 10/24/2021 8.9 (L)    Neutrophils 10/24/2021 3.94    Lymphocytes 10/24/2021 1.96    Monocytes 10/24/2021 0.67    Eosinophils 10/24/2021 0.13    Basophils 10/24/2021 0.05    Neutrophil % 10/24/2021 58.4    Lymphocyte % 10/24/2021 29.0    Monocyte % 10/24/2021 9.9    Eosinophil % 10/24/2021 1.9    Basophil% 10/24/2021 0.7    Immature Granulocyte % 10/24/2021 0.1    Immature Granulocyte Cou* 10/24/2021 0.01    Glucose 10/24/2021 96    Sodium 10/24/2021 131 (L)    Potassium 10/24/2021 4.6    Chloride 10/24/2021 98    Carbon Dioxide (CO2) 10/24/2021 27.8    Urea Nitrogen (BUN) 10/24/2021 16    Creatinine 10/24/2021 1.0    Glomerular Filtration Ra* 10/24/2021 56 (L)    Calcium 10/24/2021 9.7    AST  10/24/2021 24    ALT  10/24/2021 10    Alk Phos (alkaline Phosp* 10/24/2021 74    Albumin 10/24/2021 4.5    Bilirubin, Total 10/24/2021 0.5    Protein, Total 10/24/2021 6.5    A/G Ratio 10/24/2021 2.3    Color 10/24/2021 Yellow    Clarity 10/24/2021 Clear    Specific Gravity 10/24/2021 1.009    pH, Urine 10/24/2021 6.5    Protein, Urinalysis 10/24/2021 Negative    Glucose, Urinalysis 10/24/2021 Negative    Ketones, Urinalysis 10/24/2021 Negative    Blood, Urinalysis 10/24/2021 Negative    Nitrite, Urinalysis 10/24/2021 Negative    Leukocyte Esterase, Urin* 10/24/2021 Trace (!)    Bilirubin, Urinalysis 10/24/2021 Negative    Urobilinogen, Urinalysis 10/24/2021 0.2    WBC, UA 10/24/2021 0    Red Blood Cells, Urinaly* 10/24/2021 0    Bacteria, Urinalysis 10/24/2021 0-5    Squamous Epithelial Cell* 10/24/2021 1    Lipase 10/24/2021 30    Hemoglobin A1C 10/24/2021 5.8 (H)    Average Blood Glucose (C* 10/24/2021 120    H pylori Breath Test - L* 10/25/2021  Negative   Office Visit on 10/23/2021  Component Date Value   See Scanned Result 10/23/2021 See Results    AChR Binding Abs, Serum * 10/23/2021 <0.03    AChR Blocking Abs, Serum* 10/23/2021 14    AChR Modulating Ab - Lab* 10/23/2021 0    Anti-striation Abs - Lab* 10/23/2021 Negative    Reflex Information - Lab* 10/23/2021 Comment    MuSK Antibody - [facility] 10/23/2021 <1.0   Office Visit on 09/13/2021  Component Date Value   WBC (White Blood Cell Co* 09/13/2021 7.1    RBC (Red Blood Cell Coun* 09/13/2021 4.07    Hemoglobin 09/13/2021 12.0    Hematocrit 09/13/2021 37.0    MCV (Mean Corpuscular Vo* 09/13/2021 90.9    MCH (Mean Corpuscular He* 09/13/2021 29.5    MCHC (Mean Corpuscular H* 09/13/2021 32.4    Platelet Count 09/13/2021 168    RDW-CV (Red Cell Distrib* 09/13/2021 14.0    MPV (Mean Platelet Volum* 09/13/2021 9.2 (L)    Neutrophils 09/13/2021 4.32    Lymphocytes 09/13/2021 1.80    Monocytes 09/13/2021 0.66  Eosinophils 09/13/2021 0.19    Basophils 09/13/2021 0.08    Neutrophil % 09/13/2021 61.3    Lymphocyte % 09/13/2021 25.5    Monocyte % 09/13/2021 9.3    Eosinophil % 09/13/2021 2.7    Basophil% 09/13/2021 1.1    Immature Granulocyte % 09/13/2021 0.1    Immature Granulocyte Cou* 09/13/2021 0.01    Glucose 09/13/2021 80    Sodium 09/13/2021 133 (L)    Potassium 09/13/2021 4.6    Chloride 09/13/2021 99    Carbon Dioxide (CO2) 09/13/2021 30.5    Urea Nitrogen (BUN) 09/13/2021 18    Creatinine 09/13/2021 1.0    Glomerular Filtration Ra* 09/13/2021 53 (L)    Calcium 09/13/2021 9.9    AST  09/13/2021 24    ALT  09/13/2021 13    Alk Phos (alkaline Phosp* 09/13/2021 86    Albumin 09/13/2021 4.7    Bilirubin, Total 09/13/2021 0.4    Protein, Total 09/13/2021 6.7    A/G Ratio 09/13/2021 2.4    Color 09/13/2021 Yellow    Clarity 09/13/2021 Clear    Specific Gravity 09/13/2021 1.011    pH, Urine 09/13/2021 7.0    Protein, Urinalysis 09/13/2021 Negative    Glucose,  Urinalysis 09/13/2021 Negative    Ketones, Urinalysis 09/13/2021 Negative    Blood, Urinalysis 09/13/2021 Negative    Nitrite, Urinalysis 09/13/2021 Negative    Leukocyte Esterase, Urin* 09/13/2021 1+ (!)    Bilirubin, Urinalysis 09/13/2021 Negative    Urobilinogen, Urinalysis 09/13/2021 0.2    WBC, UA 09/13/2021 5    Red Blood Cells, Urinaly* 09/13/2021 2    Bacteria, Urinalysis 09/13/2021 0-5    Squamous Epithelial Cell* 09/13/2021 3      ASSESSMENT: Katelyn Crawford is a 81 y.o. female presenting for consultation for cholelithiasis.    Patient was oriented about the diagnosis of cholelithiasis. Also oriented about what is the gallbladder, its anatomy and function and the implications of having stones. The patient was oriented about the treatment alternatives (observation vs cholecystectomy). Patient was oriented that a low percentage of patient will continue to have similar pain symptoms even after the gallbladder is removed. Surgical technique (open vs laparoscopic) was discussed. It was also discussed the goals of the surgery (decrease the pain episodes and avoid the risk of cholecystitis) and the risk of surgery including: bleeding, infection, common bile duct injury, stone retention, injury to other organs such as bowel, liver, stomach, other complications such as hernia, bowel obstruction among others. Also discussed with patient about anesthesia and its complications such as: reaction to medications, pneumonia, heart complications, death, among others.   Discussed with patient that I think that her symptoms are multifactorial.  I think that she has symptomatic cholelithiasis in combination with.  Chronic back pain.  I think that she should get significantly improved specially the nausea on the right-sided abdominal pain.  I discussed with patient that she may need further evaluation of her back especially for the symptoms of burning that goes to the right upper extremities.  The patient reports  she understood and agreed to proceed with surgery.  Cholelithiasis without cholecystitis [K80.20]  PLAN: 1.  Robotic assisted laparoscopic cholecystectomy (06269) 2.  CBC, CMP (done) 3.  Do not take aspirin 5 days before the procedure 4.  Contact us if has any question or concern.   Patient verbalized understanding, all questions were answered, and were agreeable with the plan outlined above.     Herbert Pun, MD  Electronically signed by Herbert Pun, MD

## 2021-12-06 ENCOUNTER — Encounter: Payer: Self-pay | Admitting: General Surgery

## 2021-12-06 ENCOUNTER — Encounter
Admission: RE | Admit: 2021-12-06 | Discharge: 2021-12-06 | Disposition: A | Discharge status: Home / Self Care | Payer: Medicare Other | Source: Ambulatory Visit | Attending: General Surgery | Admitting: General Surgery

## 2021-12-06 HISTORY — DX: Complete loss of teeth, unspecified cause, unspecified class: K08.109

## 2021-12-06 HISTORY — DX: Prediabetes: R73.03

## 2021-12-06 HISTORY — DX: Chronic kidney disease, stage 2 (mild): N18.2

## 2021-12-06 HISTORY — DX: Hyperlipidemia, unspecified: E78.5

## 2021-12-06 HISTORY — DX: Complete loss of teeth, unspecified cause, unspecified class: Z97.2

## 2021-12-06 HISTORY — DX: Anemia, unspecified: D64.9

## 2021-12-06 NOTE — Patient Instructions (Addendum)
Your procedure is scheduled on: Wednesday, November 22 Report to the Registration Desk on the 1st floor of the [facility] To find out your arrival time, please call [phone] between 1PM - 3PM on: Tuesday, November 21 If your arrival time is 6:00 am, do not arrive prior to that time as the [facility] entrance doors do not open until 6:00 am.  REMEMBER: Instructions that are not followed completely may result in serious medical risk, up to and including death; or upon the discretion of your surgeon and anesthesiologist your surgery may need to be rescheduled.  Do not eat or drink after midnight the night before surgery.  No gum chewing, lozengers or hard candies.  TAKE THESE MEDICATIONS THE MORNING OF SURGERY WITH A SIP OF WATER:  Pantoprazole (Protonix) - (take one the night before and one on the morning of surgery - helps to prevent nausea after surgery.) Carbidopa-levodopa (Sinemet)  One week prior to surgery: starting today, November 16 Stop aspirin and Anti-inflammatories (NSAIDS) such as Advil, Aleve, Ibuprofen, Motrin, Naproxen, Naprosyn and Aspirin based products such as Excedrin, Goodys Powder, BC Powder. Stop ANY OVER THE COUNTER supplements until after surgery. Stop vitamin D, vitamin C, multiple vitamins, calcium You may however, continue to take Tylenol if needed for pain up until the day of surgery.  No Alcohol for 24 hours before or after surgery.  No Smoking including e-cigarettes for 24 hours prior to surgery.  No chewable tobacco products for at least 6 hours prior to surgery.  No nicotine patches on the day of surgery.  On the morning of surgery brush your teeth with toothpaste and water, you may rinse your mouth with mouthwash if you wish. Do not swallow any toothpaste or mouthwash.  Use CHG Soap as directed on instruction sheet.  Do not wear jewelry, make-up, hairpins, clips or nail polish.  Do not wear lotions, powders, or perfumes.   Do not shave  body from the neck down 48 hours prior to surgery just in case you cut yourself which could leave a site for infection.  Also, freshly shaved skin may become irritated if using the CHG soap.  Contact lenses, hearing aids and dentures may not be worn into surgery.  Do not bring valuables to the hospital. [facility] is not responsible for any missing/lost belongings or valuables.   Notify your doctor if there is any change in your medical condition (cold, fever, infection).  Wear comfortable clothing (specific to your surgery type) to the hospital.  After surgery, you can help prevent lung complications by doing breathing exercises.  Take deep breaths and cough every 1-2 hours. Your doctor may order a device called an Incentive Spirometer to help you take deep breaths. When coughing or sneezing, hold a pillow firmly against your incision with both hands. This is called "splinting." Doing this helps protect your incision. It also decreases belly discomfort.  If you are being discharged the day of surgery, you will not be allowed to drive home. You will need a responsible adult (18 years or older) to drive you home and stay with you that night.   If you are taking public transportation, you will need to have a responsible adult (18 years or older) with you. Please confirm with your physician that it is acceptable to use public transportation.   Please call the Pre-admissions Testing Dept. at [phone] if you have any questions about these instructions.  Surgery Visitation Policy:  Patients undergoing a surgery or procedure  may have two family members or support persons with them as long as the person is not COVID-19 positive or experiencing its symptoms.      Preparing for Surgery with CHLORHEXIDINE GLUCONATE (CHG) Soap  Chlorhexidine Gluconate (CHG) Soap  o An antiseptic cleaner that kills germs and bonds with the skin to continue killing germs even after washing  o Used for  showering the night before surgery and morning of surgery  Before surgery, you can play an important role by reducing the number of germs on your skin.  CHG (Chlorhexidine gluconate) soap is an antiseptic cleanser which kills germs and bonds with the skin to continue killing germs even after washing.  Please do not use if you have an allergy to CHG or antibacterial soaps. If your skin becomes reddened/irritated stop using the CHG.  1. Shower the NIGHT BEFORE SURGERY and the MORNING OF SURGERY with CHG soap.  2. If you choose to wash your hair, wash your hair first as usual with your normal shampoo.  3. After shampooing, rinse your hair and body thoroughly to remove the shampoo.  4. Use CHG as you would any other liquid soap. You can apply CHG directly to the skin and wash gently with a scrungie or a clean washcloth.  5. Apply the CHG soap to your body only from the neck down. Do not use on open wounds or open sores. Avoid contact with your eyes, ears, mouth, and genitals (private parts). Wash face and genitals (private parts) with your normal soap.  6. Wash thoroughly, paying special attention to the area where your surgery will be performed.  7. Thoroughly rinse your body with warm water.  8. Do not shower/wash with your normal soap after using and rinsing off the CHG soap.  9. Pat yourself dry with a clean towel.  10. Wear clean pajamas to bed the night before surgery.  12. Place clean sheets on your bed the night of your first shower and do not sleep with pets.  13. Shower again with the CHG soap on the day of surgery prior to arriving at the hospital.  14. Do not apply any deodorants/lotions/powders.  15. Please wear clean clothes to the hospital.

## 2021-12-06 NOTE — Progress Notes (Signed)
Perioperative Services  Pre-Admission/Anesthesia Testing Clinical Review  Date: 12/07/21  Patient Demographics:  Name: Katelyn Crawford DOB:   [birth year] MRN:   [id number]  Planned Surgical Procedure(s):    Case: [id number] Date/Time: 12/12/21 1124   Procedure: XI ROBOTIC ASSISTED LAPAROSCOPIC CHOLECYSTECTOMY (Abdomen)   Anesthesia type: General   Pre-op diagnosis: K80.20 Cholelithiasis w/o cholecystitis   Location: [facility] OR ROOM 04 / [facility]   Surgeons: Herbert Pun, MD   NOTE: Available PAT nursing documentation and vital signs have been reviewed. Clinical nursing staff has updated patient's PMH/PSHx, current medication list, and drug allergies/intolerances to ensure comprehensive history available to assist in medical decision making as it pertains to the aforementioned surgical procedure and anticipated anesthetic course. Extensive review of available clinical information performed. [facility] PMH and PSHx updated with any diagnoses/procedures that  may have been inadvertently omitted during her intake with the pre-admission testing department's nursing staff.  Clinical Discussion:  Katelyn Crawford is a 81 y.o. female who is submitted for pre-surgical anesthesia review and clearance prior to her undergoing the above procedure. Patient has never been a smoker. Pertinent PMH includes: CAD, cardiomyopathy, trifascicular bundle branch block, PVCs, sinus bradycardia, HTN, HLD, prediabetes, CKD-II, GERD (on daily PPI), anemia, OA, thoracolumbar DDD, thoracic kyphosis, nephrolithiasis, Parkinson's disease.  Patient is followed by cardiology (Pagidipati, MD). She was last seen in the cardiology clinic on 11/19/2021; notes reviewed.  At the time of her clinic visit, patient reporting pain across her upper back and nausea.  She has been having episodes of elevated blood pressure.  She denied any associated chest pain, short of breath, PND, orthopnea, palpitations, significant  peripheral edema, vertiginous symptoms, or presyncope/syncope.  Patient with a past medical history significant for cardiovascular diagnoses.  Diagnostic LEFT heart catheterization performed on 03/10/2012 revealed multivessel CAD; 40% mid LAD, 50% proximal ramus intermedius, 50% proximal RCA, 90% mid RCA, 50% distal RCA, and 50% RPDA.  Interventional list unable to cannulate the RCA.  Decision was made to defer intervention opting for aggressive medical management.  Cardiologist noted that if patient developed recurrent chest pain, she would need to be seen at [facility] for consideration of a high risk PCI procedure.  Developed recurrent anginal/anginal equivalent symptoms.  She ultimately underwent a repeat diagnostic LEFT heart catheterization on 06/25/2013, again revealing multivessel CAD; 30% proximal LAD, 40% mid LAD, 20% mid LCx, 30% ramus intermedius, 30% proximal RCA, 70% mid RCA, 40% distal RCA-1, and 90% distal RCA-2.  PCI was performed to the mid RCA and distal RCA placing 2.5 mm Promus DES x2 to the aforementioned location.  Procedure yielded excellent angiographic result and TIMI-3 flow.  Most recent myocardial perfusion imaging study was performed on 12/25/2016 revealing a hyperdynamic left ventricular systolic function with an EF of 74%.  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  Patient exhibited a normal blood pressure response to stress.  No evidence of stress-induced myocardial ischemia noted.  Most recent TTE was performed on 04/28/2017 revealing a normal left ventricular systolic function with an EF of >55%. Left ventricular diastolic Doppler parameters consistent with abnormal relaxation (G1DD).  There was mild mitral annular calcification.  Left atrium noted be mildly enlarged.  There was mild pan valvular regurgitation.  There was no evidence of a significant transvalvular gradient to suggest stenosis.  Patient with elevated blood pressure at 176/70  mmHg despite currently prescribed vasodilator (hydralazine) and ARB (losartan) therapies.  Patient is on rosuvastatin for her  HLD diagnosis and further ASCVD prevention.  Patient also has a supply of short acting nitrates (NTG) to use on a as needed basis for recurrent angina/anginal equivalent symptoms.  Denied recent use.  Patient has a prediabetes diagnosis; last HgbA1c was well controlled at 5.8% when checked on 10/24/2021. Patient does not have an OSAH diagnosis.  Functional capacity limited by age related debility, arthritides, and multiple medical comorbidities.  With that being said, patient not felt to be able to achieve 4 METS of activity without experiencing angina/anginal equivalent symptoms.  Given patient's elevated blood pressure, ARB dose was increased.  No other changes were made to her medication regimen.  Patient to follow-up with outpatient cardiology in 1 month or sooner if needed.  Katelyn Crawford is scheduled for an XI ROBOTIC ASSISTED LAPAROSCOPIC CHOLECYSTECTOMY (Abdomen) on 12/12/2021 with Dr. Herbert Pun, MD.  Given patient's past medical history significant for cardiovascular diagnoses, presurgical cardiac clearance was sought by the PAT team. Per cardiology, "this patient is optimized for surgery and may proceed with the planned procedural course with a LOW risk of significant perioperative cardiovascular complications".  In review of her medication reconciliation, it is noted that patient is currently on prescribed daily antiplatelet therapy. She has been instructed on recommendations from her cardiologist for continuing her daily dose ASA throughout her perioperative course.  Patient denies previous perioperative complications with anesthesia in the past. In review of the available records, it is noted that patient underwent a MAC anesthetic course at [facility] ([facility] in 09/2017 without documented complications.      12/06/2021    1:03 PM 10/01/2021   11:00  PM 10/01/2021    8:30 PM  Vitals with BMI  Height _0     Weight 143 lbs    BMI 51.76    Systolic  160 737  Diastolic  76 89  Pulse  60 69    Providers/Specialists:   NOTE: Primary physician provider listed below. Patient may have been seen by APP or partner within same practice.   PROVIDER ROLE / SPECIALTY LAST Tanna Savoy, MD General Surgery (Surgeon) 12/05/2021  Idelle Crouch, MD Primary Care Provider 11/21/2021  Nyilah Kight Bernhardt, MD Cardiology 11/19/2021  Jennings Books, MD Neurology 11/28/2021   Allergies:  Penicillins and Sulfa antibiotics  Current Home Medications:   No current facility-administered medications for this encounter.    acetaminophen (TYLENOL) 500 MG tablet   aspirin EC 81 MG tablet   carbidopa-levodopa (SINEMET CR) 50-200 MG tablet   carbidopa-levodopa (SINEMET IR) 25-250 MG tablet   cetirizine (ZYRTEC) 10 MG tablet   Cholecalciferol (VITAMIN D-3) 1000 units CAPS   fluticasone (FLONASE) 50 MCG/ACT nasal spray   hydrALAZINE (APRESOLINE) 25 MG tablet   losartan (COZAAR) 25 MG tablet   Multiple Minerals (CALCIUM-MAGNESIUM-ZINC) TABS   Multiple Vitamins-Minerals (MULTIVITAMIN ADULT) TABS   nitroGLYCERIN (NITROSTAT) 0.4 MG SL tablet   ondansetron (ZOFRAN) 4 MG tablet   pantoprazole (PROTONIX) 40 MG tablet   rosuvastatin (CRESTOR) 20 MG tablet   sertraline (ZOLOFT) 50 MG tablet   vitamin C (ASCORBIC ACID) 500 MG tablet   History:   Past Medical History:  Diagnosis Date   Anemia    Aortic atherosclerosis (HCC)    Arthritis    Cardiomegaly    Cardiomyopathy (HCC)    Cholelithiasis    Chronic kidney disease, stage 2 (mild)    Chronic sinus bradycardia    Coronary artery disease 03/10/2012   a.) LHC 03/10/2012: 40% mLAD,  50% pRI, 50% pRCA, 90% mRCA, 50% dRCA, 50% RPDA - unable to cannulate RCA - med mgmt; b.) LHC/PCI 06/25/2013: 30% pLAD, 40% mLAD, 20% mLCx, 30% RI, 30% pRCA, 70% mRCA (2.5 mm Promus DES), 40% dRCA-1, 90% dRCA-2  (2.5 mm Promus DES)   DDD (degenerative disc disease), thoracolumbar    Diverticulosis    Dizziness    Environmental allergies    Full dentures    GERD (gastroesophageal reflux disease) 06/25/2013   H/O bilateral cataract extraction 2019   History of diverticulosis    History of kidney stones    HLD (hyperlipidemia)    Hyperlipidemia, unspecified    Hypertension    Nephrolithiasis    Osteoporosis, post-menopausal    Parkinson disease    Pre-diabetes    PVC's (premature ventricular contractions)    Scoliosis    Thoracic kyphosis    Trifascicular bundle branch block    a.) 1st degree AVB + RBBB + LAFB   Vascular disease    Past Surgical History:  Procedure Laterality Date   APPENDECTOMY     BREAST EXCISIONAL BIOPSY Left 1984   neg   BREAST EXCISIONAL BIOPSY Right 1997   lipoma   CATARACT EXTRACTION W/PHACO Left 09/02/2017   Procedure: CATARACT EXTRACTION PHACO AND INTRAOCULAR LENS PLACEMENT ([place])  LEFT;  Surgeon: Leandrew Koyanagi, MD;  Location: [place];  Service: Ophthalmology;  Laterality: Left;   CATARACT EXTRACTION W/PHACO Right 10/01/2017   Procedure: CATARACT EXTRACTION PHACO AND INTRAOCULAR LENS PLACEMENT ([place]) RIGHT;  Surgeon: Leandrew Koyanagi, MD;  Location: [facility];  Service: Ophthalmology;  Laterality: Right;   COLONOSCOPY WITH PROPOFOL N/A 11/27/2016   Procedure: COLONOSCOPY WITH PROPOFOL;  Surgeon: [place], Benay Pike, MD;  Location: [facility];  Service: Gastroenterology;  Laterality: N/A;   CORONARY ANGIOPLASTY WITH STENT PLACEMENT Left 06/25/2013   Procedure: CORONARY ANGIOPLASTY WITH STENT PLACEMENT; Location: [facility]; Surgeon: Samella Parr, MD   KNEE ARTHROPLASTY Left 01/22/2017   Procedure: COMPUTER ASSISTED TOTAL KNEE ARTHROPLASTY;  Surgeon: Dereck Leep, MD;  Location: [facility];  Service: Orthopedics;  Laterality: Left;   KNEE ARTHROSCOPY Left 02/01/2015   Procedure: ARTHROSCOPY KNEE, partial medial & lateral  menisectomy,,medial and patelofemoral chondroplasty;  Surgeon: Dereck Leep, MD;  Location: [facility];  Service: Orthopedics;  Laterality: Left;   LEFT HEART CATH AND CORONARY ANGIOGRAPHY Left 03/10/2012   Procedure: LEFT HEART CATH AND CORONARY ANGIOGRAPHY; Location: [facility]; Surgeon: Isaias Cowman, MD   LITHOTRIPSY Right 1995   RIGHT AXILLARY LIPOMA REMOVAL     TUBAL LIGATION     VARICOSE VEIN SURGERY     Family History  Problem Relation Age of Onset   Hypertension Mother    Congestive Heart Failure Father    Coronary artery disease Father    Coronary artery disease Son        STEMI w/ PCI   Breast cancer Neg Hx    Colon cancer Neg Hx    Colon polyps Neg Hx    Social History   Tobacco Use   Smoking status: Never   Smokeless tobacco: Never  Vaping Use   Vaping Use: Never used  Substance Use Topics   Alcohol use: No   Drug use: No    Pertinent Clinical Results:  LABS: Labs reviewed: Acceptable for surgery.  No visits with results within 3 Day(s) from this visit.  Latest known visit with results is:  Admission on 10/01/2021, Discharged on 10/01/2021  Component Date Value Ref Range Status   WBC 10/01/2021 5.6  4.0 - 10.5 K/uL Final   RBC 10/01/2021 4.13  3.87 - 5.11 MIL/uL Final   Hemoglobin 10/01/2021 12.0  12.0 - 15.0 g/dL Final   HCT 10/01/2021 36.8  36.0 - 46.0 % Final   MCV 10/01/2021 89.1  80.0 - 100.0 fL Final   MCH 10/01/2021 29.1  26.0 - 34.0 pg Final   MCHC 10/01/2021 32.6  30.0 - 36.0 g/dL Final   RDW 10/01/2021 14.3  11.5 - 15.5 % Final   Platelets 10/01/2021 169  150 - 400 K/uL Final   nRBC 10/01/2021 0.0  0.0 - 0.2 % Final   Neutrophils Relative % 10/01/2021 50  % Final   Neutro Abs 10/01/2021 2.7  1.7 - 7.7 K/uL Final   Lymphocytes Relative 10/01/2021 36  % Final   Lymphs Abs 10/01/2021 2.0  0.7 - 4.0 K/uL Final   Monocytes Relative 10/01/2021 10  % Final   Monocytes Absolute 10/01/2021 0.6  0.1 - 1.0 K/uL Final   Eosinophils Relative 10/01/2021  3  % Final   Eosinophils Absolute 10/01/2021 0.2  0.0 - 0.5 K/uL Final   Basophils Relative 10/01/2021 1  % Final   Basophils Absolute 10/01/2021 0.1  0.0 - 0.1 K/uL Final   Immature Granulocytes 10/01/2021 0  % Final   Abs Immature Granulocytes 10/01/2021 0.01  0.00 - 0.07 K/uL Final   Performed at [facility], [place], [place] [address]   Sodium 10/01/2021 136  135 - 145 mmol/L Final   Potassium 10/01/2021 4.4  3.5 - 5.1 mmol/L Final   Chloride 10/01/2021 99  98 - 111 mmol/L Final   CO2 10/01/2021 26  22 - 32 mmol/L Final   Glucose, Bld 10/01/2021 106 (H)  70 - 99 mg/dL Final   Glucose reference range applies only to samples taken after fasting for at least 8 hours.   BUN 10/01/2021 16  8 - 23 mg/dL Final   Creatinine, Ser 10/01/2021 0.86  0.44 - 1.00 mg/dL Final   Calcium 10/01/2021 9.9  8.9 - 10.3 mg/dL Final   Total Protein 10/01/2021 7.4  6.5 - 8.1 g/dL Final   Albumin 10/01/2021 4.6  3.5 - 5.0 g/dL Final   AST 10/01/2021 29  15 - 41 U/L Final   ALT 10/01/2021 17  0 - 44 U/L Final   Alkaline Phosphatase 10/01/2021 78  38 - 126 U/L Final   Total Bilirubin 10/01/2021 0.7  0.3 - 1.2 mg/dL Final   GFR, Estimated 10/01/2021 >60  >60 mL/min Final   Comment: (NOTE) Calculated using the CKD-EPI Creatinine Equation (2021)    Anion gap 10/01/2021 11  5 - 15 Final   Performed at [facility], [place], [place] [address]   Troponin I (High Sensitivity) 10/01/2021 8  <18 ng/L Final   Comment: (NOTE) Elevated high sensitivity troponin I (hsTnI) values and significant  changes across serial measurements may suggest ACS but many other  chronic and acute conditions are known to elevate hsTnI results.  Refer to the "Links" section for chest pain algorithms and additional  guidance. Performed at [facility], [place], [place] [address]    Color, Urine 10/01/2021 YELLOW (A)  YELLOW Final   APPearance 10/01/2021  CLEAR (A)  CLEAR Final   Specific Gravity, Urine 10/01/2021 1.004 (L)  1.005 - 1.030 Final   pH 10/01/2021 7.0  5.0 - 8.0 Final   Glucose, UA 10/01/2021 NEGATIVE  NEGATIVE mg/dL Final  Hgb urine dipstick 10/01/2021 NEGATIVE  NEGATIVE Final   Bilirubin Urine 10/01/2021 NEGATIVE  NEGATIVE Final   Ketones, ur 10/01/2021 NEGATIVE  NEGATIVE mg/dL Final   Protein, ur 10/01/2021 NEGATIVE  NEGATIVE mg/dL Final   Nitrite 10/01/2021 NEGATIVE  NEGATIVE Final   Leukocytes,Ua 10/01/2021 NEGATIVE  NEGATIVE Final   Performed at [facility], [place], [place] [address]   Troponin I (High Sensitivity) 10/01/2021 14  <18 ng/L Final   Comment: (NOTE) Elevated high sensitivity troponin I (hsTnI) values and significant  changes across serial measurements may suggest ACS but many other  chronic and acute conditions are known to elevate hsTnI results.  Refer to the "Links" section for chest pain algorithms and additional  guidance. Performed at [facility], [address] [place], [place] [address]     ECG: Date: 10/01/2021 Time ECG obtained: 2037 PM Rate: 69 bpm Rhythm:  Sinus rhythm with first-degree AV block; RBBB; LAFB Axis (leads I and aVF): Normal Intervals: PR 246 ms. QRS 134 ms. QTc 489 ms. ST segment and T wave changes: No evidence of acute ST segment elevation or depression Comparison: Similar to previous tracing obtained on 07/07/2020   IMAGING / PROCEDURES: MR THORACIC SPINE WO CONTRAST performed on 11/06/2021 Scoliosis of the thoracic spine is convex to the left at T3. Exaggerated thoracic kyphosis is stable Mild right foraminal narrowing at T2-3 and T3-4. Mild disc bulging and facet hypertrophy at T6-7, T7-8, T10-11, and T11-12 without significant stenosis. Chronic fatty endplate marrow changes across the endplates T4-5 through V56-43 with the exception of T6-7 and T7-8 which changes are more edematous.  CT ANGIO CHEST/ABD/PEL FOR DISSECTION W  AND/OR WO CONTRAST performed on 10/01/2021 No evidence of aortic dissection or aneurysm. Coronary artery disease Cardiomegaly   Aortic atherosclerosis No acute cardiopulmonary disease Punctate right nephrolithiasis.  No hydronephrosis Cholelithiasis Sigmoid diverticulosis No acute findings in the abdomen or pelvis.  TRANSTHORACIC ECHOCARDIOGRAM performed on 04/28/2017 Normal left ventricular systolic function with an EF of >55% Left ventricular diastolic Doppler parameters consistent with abnormal relaxation (G1DD). Mildly enlarged left atrium Normal right ventricular systolic function Mild mitral annular calcification Mild pan valvular regurgitation  Normal transvalvular gradients; no valvular stenosis No pericardial effusion  Impression and Plan:  Katelyn Crawford has been referred for pre-anesthesia review and clearance prior to her undergoing the planned anesthetic and procedural courses. Available labs, pertinent testing, and imaging results were personally reviewed by me. This patient has been appropriately cleared by cardiology with an overall LOW risk of significant perioperative cardiovascular complications.  Based on clinical review performed today (12/07/21), barring any significant acute changes in the patient's overall condition, it is anticipated that she will be able to proceed with the planned surgical intervention. Any acute changes in clinical condition may necessitate her procedure being postponed and/or cancelled. Patient will meet with anesthesia team (MD and/or CRNA) on the day of her procedure for preoperative evaluation/assessment. Questions regarding anesthetic course will be fielded at that time.   Pre-surgical instructions were reviewed with the patient during her PAT appointment and questions were fielded by PAT clinical staff. Patient was advised that if any questions or concerns arise prior to her procedure then she should return a call to PAT and/or her surgeon's  office to discuss.  Katelyn Loh, MSN, APRN, FNP-C, CEN [facility]  Peri-operative Services Nurse Practitioner Phone: [phone] Fax: [phone] 12/07/21 9:57 AM  NOTE: This note has been prepared using Dragon dictation software.  Despite my best ability to proofread, there is always the potential that unintentional transcriptional errors may still occur from this process.

## 2021-12-12 ENCOUNTER — Other Ambulatory Visit: Payer: Self-pay

## 2021-12-12 ENCOUNTER — Encounter
Admission: RE | Disposition: A | Discharge status: Home / Self Care | Payer: Self-pay | Source: Home / Self Care | Attending: General Surgery

## 2021-12-12 ENCOUNTER — Ambulatory Visit: Payer: Medicare Other | Admitting: Urgent Care

## 2021-12-12 ENCOUNTER — Ambulatory Visit
Admission: RE | Admit: 2021-12-12 | Discharge: 2021-12-12 | Disposition: A | Discharge status: Home / Self Care | Payer: Medicare Other | Attending: General Surgery | Admitting: General Surgery

## 2021-12-12 ENCOUNTER — Encounter: Payer: Self-pay | Admitting: General Surgery

## 2021-12-12 DIAGNOSIS — G8929 Other chronic pain: Secondary | ICD-10-CM | POA: Insufficient documentation

## 2021-12-12 DIAGNOSIS — K8010 Calculus of gallbladder with chronic cholecystitis without obstruction: Secondary | ICD-10-CM | POA: Insufficient documentation

## 2021-12-12 DIAGNOSIS — R54 Age-related physical debility: Secondary | ICD-10-CM | POA: Diagnosis not present

## 2021-12-12 DIAGNOSIS — I129 Hypertensive chronic kidney disease with stage 1 through stage 4 chronic kidney disease, or unspecified chronic kidney disease: Secondary | ICD-10-CM | POA: Diagnosis not present

## 2021-12-12 DIAGNOSIS — I2510 Atherosclerotic heart disease of native coronary artery without angina pectoris: Secondary | ICD-10-CM | POA: Diagnosis not present

## 2021-12-12 DIAGNOSIS — N182 Chronic kidney disease, stage 2 (mild): Secondary | ICD-10-CM | POA: Diagnosis not present

## 2021-12-12 DIAGNOSIS — E785 Hyperlipidemia, unspecified: Secondary | ICD-10-CM | POA: Insufficient documentation

## 2021-12-12 DIAGNOSIS — M549 Dorsalgia, unspecified: Secondary | ICD-10-CM | POA: Diagnosis not present

## 2021-12-12 DIAGNOSIS — M1990 Unspecified osteoarthritis, unspecified site: Secondary | ICD-10-CM | POA: Diagnosis not present

## 2021-12-12 DIAGNOSIS — K8020 Calculus of gallbladder without cholecystitis without obstruction: Secondary | ICD-10-CM | POA: Diagnosis present

## 2021-12-12 DIAGNOSIS — I453 Trifascicular block: Secondary | ICD-10-CM | POA: Diagnosis not present

## 2021-12-12 DIAGNOSIS — I429 Cardiomyopathy, unspecified: Secondary | ICD-10-CM | POA: Insufficient documentation

## 2021-12-12 DIAGNOSIS — K219 Gastro-esophageal reflux disease without esophagitis: Secondary | ICD-10-CM | POA: Diagnosis not present

## 2021-12-12 DIAGNOSIS — Z7982 Long term (current) use of aspirin: Secondary | ICD-10-CM | POA: Insufficient documentation

## 2021-12-12 DIAGNOSIS — J302 Other seasonal allergic rhinitis: Secondary | ICD-10-CM | POA: Diagnosis not present

## 2021-12-12 DIAGNOSIS — G20A1 Parkinson's disease without dyskinesia, without mention of fluctuations: Secondary | ICD-10-CM | POA: Insufficient documentation

## 2021-12-12 HISTORY — DX: Other intervertebral disc degeneration, thoracolumbar region: M51.35

## 2021-12-12 HISTORY — DX: Atherosclerosis of aorta: I70.0

## 2021-12-12 HISTORY — DX: Unspecified kyphosis, thoracic region: M40.204

## 2021-12-12 HISTORY — DX: Cardiomegaly: I51.7

## 2021-12-12 HISTORY — DX: Diverticulosis of intestine, part unspecified, without perforation or abscess without bleeding: K57.90

## 2021-12-12 HISTORY — DX: Scoliosis, unspecified: M41.9

## 2021-12-12 HISTORY — DX: Calculus of gallbladder without cholecystitis without obstruction: K80.20

## 2021-12-12 SURGERY — CHOLECYSTECTOMY, ROBOT-ASSISTED, LAPAROSCOPIC
Anesthesia: General | Site: Abdomen

## 2021-12-12 MED ORDER — PHENYLEPHRINE HCL-NACL 20-0.9 MG/250ML-% IV SOLN
INTRAVENOUS | Status: AC
  Filled 2021-12-12: qty 250

## 2021-12-12 MED ORDER — FENTANYL CITRATE (PF) 100 MCG/2ML IJ SOLN
INTRAMUSCULAR | Status: AC
  Filled 2021-12-12: qty 2

## 2021-12-12 MED ORDER — PROPOFOL 10 MG/ML IV BOLUS
INTRAVENOUS | Status: DC | PRN
  Administered 2021-12-12 (×2): 20 mg via INTRAVENOUS
  Administered 2021-12-12: 100 mg via INTRAVENOUS
  Administered 2021-12-12: 40 mg via INTRAVENOUS

## 2021-12-12 MED ORDER — OXYCODONE HCL 5 MG PO TABS
ORAL_TABLET | ORAL | Status: AC
  Filled 2021-12-12: qty 1

## 2021-12-12 MED ORDER — GLYCOPYRROLATE 0.2 MG/ML IJ SOLN
INTRAMUSCULAR | Status: AC
  Filled 2021-12-12: qty 1

## 2021-12-12 MED ORDER — 0.9 % SODIUM CHLORIDE (POUR BTL) OPTIME
TOPICAL | Status: DC | PRN
  Administered 2021-12-12: 500 mL

## 2021-12-12 MED ORDER — OXYCODONE HCL 5 MG PO TABS
5.0000 mg | ORAL_TABLET | Freq: Once | ORAL | Status: AC | PRN
  Administered 2021-12-12: 5 mg via ORAL

## 2021-12-12 MED ORDER — PHENYLEPHRINE HCL-NACL 20-0.9 MG/250ML-% IV SOLN
INTRAVENOUS | Status: DC | PRN
  Administered 2021-12-12: 10 ug/min via INTRAVENOUS

## 2021-12-12 MED ORDER — ONDANSETRON HCL 4 MG/2ML IJ SOLN
INTRAMUSCULAR | Status: AC
  Filled 2021-12-12: qty 2

## 2021-12-12 MED ORDER — ROCURONIUM BROMIDE 100 MG/10ML IV SOLN
INTRAVENOUS | Status: DC | PRN
  Administered 2021-12-12: 50 mg via INTRAVENOUS
  Administered 2021-12-12: 10 mg via INTRAVENOUS

## 2021-12-12 MED ORDER — CHLORHEXIDINE GLUCONATE 0.12 % MT SOLN: 15.0000 mL | Freq: Once | OROMUCOSAL | Status: AC

## 2021-12-12 MED ORDER — SUGAMMADEX SODIUM 200 MG/2ML IV SOLN
INTRAVENOUS | Status: DC | PRN
  Administered 2021-12-12: 80 mg via INTRAVENOUS
  Administered 2021-12-12: 120 mg via INTRAVENOUS

## 2021-12-12 MED ORDER — CHLORHEXIDINE GLUCONATE 0.12 % MT SOLN
OROMUCOSAL | Status: AC
  Administered 2021-12-12: 15 mL via OROMUCOSAL
  Filled 2021-12-12: qty 15

## 2021-12-12 MED ORDER — DEXAMETHASONE SODIUM PHOSPHATE 10 MG/ML IJ SOLN
INTRAMUSCULAR | Status: DC | PRN
  Administered 2021-12-12: 10 mg via INTRAVENOUS

## 2021-12-12 MED ORDER — INDOCYANINE GREEN 25 MG IV SOLR
1.2500 mg | Freq: Once | INTRAVENOUS | Status: AC
  Administered 2021-12-12: 1.25 mg via INTRAVENOUS
  Filled 2021-12-12: qty 0.5

## 2021-12-12 MED ORDER — FENTANYL CITRATE (PF) 100 MCG/2ML IJ SOLN
INTRAMUSCULAR | Status: DC | PRN
  Administered 2021-12-12: 50 ug via INTRAVENOUS
  Administered 2021-12-12: 25 ug via INTRAVENOUS
  Administered 2021-12-12: 50 ug via INTRAVENOUS

## 2021-12-12 MED ORDER — LACTATED RINGERS IV SOLN: INTRAVENOUS | Status: DC

## 2021-12-12 MED ORDER — ORAL CARE MOUTH RINSE: 15.0000 mL | Freq: Once | OROMUCOSAL | Status: AC

## 2021-12-12 MED ORDER — ONDANSETRON HCL 4 MG/2ML IJ SOLN
INTRAMUSCULAR | Status: DC | PRN
  Administered 2021-12-12: 4 mg via INTRAVENOUS

## 2021-12-12 MED ORDER — FENTANYL CITRATE (PF) 100 MCG/2ML IJ SOLN
25.0000 ug | INTRAMUSCULAR | Status: DC | PRN
  Administered 2021-12-12: 25 ug via INTRAVENOUS
  Administered 2021-12-12: 50 ug via INTRAVENOUS

## 2021-12-12 MED ORDER — BUPIVACAINE-EPINEPHRINE (PF) 0.25% -1:200000 IJ SOLN
INTRAMUSCULAR | Status: AC
  Filled 2021-12-12: qty 30

## 2021-12-12 MED ORDER — CEFAZOLIN SODIUM-DEXTROSE 2-4 GM/100ML-% IV SOLN
2.0000 g | INTRAVENOUS | Status: AC
  Administered 2021-12-12: 2 g via INTRAVENOUS

## 2021-12-12 MED ORDER — CEFAZOLIN SODIUM-DEXTROSE 2-4 GM/100ML-% IV SOLN
INTRAVENOUS | Status: AC
  Filled 2021-12-12: qty 100

## 2021-12-12 MED ORDER — HYDRALAZINE HCL 20 MG/ML IJ SOLN
INTRAMUSCULAR | Status: AC
  Filled 2021-12-12: qty 1

## 2021-12-12 MED ORDER — FENTANYL CITRATE (PF) 100 MCG/2ML IJ SOLN
INTRAMUSCULAR | Status: AC
  Administered 2021-12-12: 50 ug via INTRAVENOUS
  Filled 2021-12-12: qty 2

## 2021-12-12 MED ORDER — LACTATED RINGERS IV SOLN: INTRAVENOUS | Status: DC | PRN

## 2021-12-12 MED ORDER — PROPOFOL 10 MG/ML IV BOLUS
INTRAVENOUS | Status: AC
  Filled 2021-12-12: qty 20

## 2021-12-12 MED ORDER — HYDROCODONE-ACETAMINOPHEN 5-325 MG PO TABS: 1.0000 | ORAL_TABLET | ORAL | 0 refills | Status: AC | PRN

## 2021-12-12 MED ORDER — ONDANSETRON HCL 4 MG/2ML IJ SOLN
4.0000 mg | Freq: Once | INTRAMUSCULAR | Status: AC
  Administered 2021-12-12: 4 mg via INTRAVENOUS

## 2021-12-12 MED ORDER — DEXMEDETOMIDINE HCL IN NACL 80 MCG/20ML IV SOLN
INTRAVENOUS | Status: DC | PRN
  Administered 2021-12-12 (×2): 4 ug via BUCCAL

## 2021-12-12 MED ORDER — OXYCODONE HCL 5 MG/5ML PO SOLN: 5.0000 mg | Freq: Once | ORAL | Status: AC | PRN

## 2021-12-12 MED ORDER — BUPIVACAINE-EPINEPHRINE 0.25% -1:200000 IJ SOLN
INTRAMUSCULAR | Status: DC | PRN
  Administered 2021-12-12: 30 mL

## 2021-12-12 MED ORDER — HYDRALAZINE HCL 20 MG/ML IJ SOLN
10.0000 mg | Freq: Once | INTRAMUSCULAR | Status: AC
  Administered 2021-12-12: 10 mg via INTRAVENOUS

## 2021-12-12 SURGICAL SUPPLY — 53 items
BAG PRESSURE INF REUSE 1000 (BAG) IMPLANT
BLADE SURG SZ11 CARB STEEL (BLADE) ×2 IMPLANT
CANNULA REDUC XI 12-8 STAPL (CANNULA) ×2
CANNULA REDUCER 12-8 DVNC XI (CANNULA) ×2 IMPLANT
CATH REDDICK CHOLANGI 4FR 50CM (CATHETERS) IMPLANT
CLIP LIGATING HEM O LOK PURPLE (MISCELLANEOUS) IMPLANT
CLIP LIGATING HEMO O LOK GREEN (MISCELLANEOUS) ×2 IMPLANT
DERMABOND ADVANCED .7 DNX12 (GAUZE/BANDAGES/DRESSINGS) ×2 IMPLANT
DRAPE ARM DVNC X/XI (DISPOSABLE) ×8 IMPLANT
DRAPE C-ARM XRAY 36X54 (DRAPES) IMPLANT
DRAPE COLUMN DVNC XI (DISPOSABLE) ×2 IMPLANT
DRAPE DA VINCI XI ARM (DISPOSABLE) ×8
DRAPE DA VINCI XI COLUMN (DISPOSABLE) ×2
ELECT REM PT RETURN 9FT ADLT (ELECTROSURGICAL) ×2
ELECTRODE REM PT RTRN 9FT ADLT (ELECTROSURGICAL) ×2 IMPLANT
GLOVE BIO SURGEON STRL SZ 6.5 (GLOVE) ×4 IMPLANT
GLOVE BIOGEL PI IND STRL 6.5 (GLOVE) ×4 IMPLANT
GOWN STRL REUS W/ TWL LRG LVL3 (GOWN DISPOSABLE) ×6 IMPLANT
GOWN STRL REUS W/TWL LRG LVL3 (GOWN DISPOSABLE) ×6
GRASPER SUT TROCAR 14GX15 (MISCELLANEOUS) ×2 IMPLANT
IRRIGATOR SUCT 8 DISP DVNC XI (IRRIGATION / IRRIGATOR) IMPLANT
IRRIGATOR SUCTION 8MM XI DISP (IRRIGATION / IRRIGATOR)
IV CATH ANGIO 12GX3 LT BLUE (NEEDLE) IMPLANT
IV NS 1000ML (IV SOLUTION)
IV NS 1000ML BAXH (IV SOLUTION) IMPLANT
KIT PINK PAD W/HEAD ARE REST (MISCELLANEOUS) ×2
KIT PINK PAD W/HEAD ARM REST (MISCELLANEOUS) ×2 IMPLANT
LABEL OR SOLS (LABEL) ×2 IMPLANT
MANIFOLD NEPTUNE II (INSTRUMENTS) ×2 IMPLANT
NDL INSUFFLATION 14GA 120MM (NEEDLE) ×2 IMPLANT
NEEDLE HYPO 22GX1.5 SAFETY (NEEDLE) ×2 IMPLANT
NEEDLE INSUFFLATION 14GA 120MM (NEEDLE) ×2 IMPLANT
NS IRRIG 500ML POUR BTL (IV SOLUTION) ×2 IMPLANT
OBTURATOR OPTICAL STANDARD 8MM (TROCAR) ×2
OBTURATOR OPTICAL STND 8 DVNC (TROCAR) ×2
OBTURATOR OPTICALSTD 8 DVNC (TROCAR) ×2 IMPLANT
PACK LAP CHOLECYSTECTOMY (MISCELLANEOUS) ×2 IMPLANT
SEAL CANN UNIV 5-8 DVNC XI (MISCELLANEOUS) ×6 IMPLANT
SEAL XI 5MM-8MM UNIVERSAL (MISCELLANEOUS) ×6
SET TUBE SMOKE EVAC HIGH FLOW (TUBING) ×2 IMPLANT
SOLUTION ELECTROLUBE (MISCELLANEOUS) ×2 IMPLANT
SPIKE FLUID TRANSFER (MISCELLANEOUS) ×4 IMPLANT
SPONGE T-LAP 4X18 ~~LOC~~+RFID (SPONGE) IMPLANT
STAPLER CANNULA SEAL DVNC XI (STAPLE) ×2 IMPLANT
STAPLER CANNULA SEAL XI (STAPLE) ×2
SUT MNCRL 4-0 (SUTURE) ×2
SUT MNCRL 4-0 27XMFL (SUTURE) ×2
SUT VICRYL 0 UR6 27IN ABS (SUTURE) ×2 IMPLANT
SUTURE MNCRL 4-0 27XMF (SUTURE) ×2 IMPLANT
SYS BAG RETRIEVAL 10MM (BASKET) ×2
SYSTEM BAG RETRIEVAL 10MM (BASKET) ×2 IMPLANT
TRAP FLUID SMOKE EVACUATOR (MISCELLANEOUS) ×2 IMPLANT
WATER STERILE IRR 500ML POUR (IV SOLUTION) ×2 IMPLANT

## 2021-12-12 NOTE — Transfer of Care (Signed)
Immediate Anesthesia Transfer of Care Note  Patient: Katelyn Crawford  Procedure(s) Performed: XI ROBOTIC ASSISTED LAPAROSCOPIC CHOLECYSTECTOMY (Abdomen) INDOCYANINE GREEN FLUORESCENCE IMAGING (ICG)  Patient Location: PACU  Anesthesia Type:General  Level of Consciousness: awake, alert , oriented, and patient cooperative  Airway & Oxygen Therapy: Patient Spontanous Breathing and Patient connected to face mask oxygen  Post-op Assessment: Report given to RN and Post -op Vital signs reviewed and stable  Post vital signs: Reviewed and stable  Last Vitals:  Vitals Value Taken Time  BP 187/88 12/12/21 1400  Temp 36.6 C 12/12/21 1401  Pulse 87 12/12/21 1409  Resp 30 12/12/21 1409  SpO2 100 % 12/12/21 1409  Vitals shown include unvalidated device data.  Last Pain:  Vitals:   12/12/21 1401  TempSrc:   PainSc: 4          Complications: No notable events documented.

## 2021-12-12 NOTE — Discharge Instructions (Addendum)

## 2021-12-12 NOTE — Anesthesia Postprocedure Evaluation (Signed)
Anesthesia Post Note  Patient: Katelyn Crawford  Procedure(s) Performed: XI ROBOTIC ASSISTED LAPAROSCOPIC CHOLECYSTECTOMY (Abdomen) INDOCYANINE GREEN FLUORESCENCE IMAGING (ICG)  Patient location during evaluation: PACU Anesthesia Type: General Level of consciousness: awake and alert Pain management: pain level controlled Vital Signs Assessment: post-procedure vital signs reviewed and stable Respiratory status: spontaneous breathing, nonlabored ventilation, respiratory function stable and patient connected to nasal cannula oxygen Cardiovascular status: blood pressure returned to baseline and stable Postop Assessment: no apparent nausea or vomiting Anesthetic complications: no  No notable events documented.   Last Vitals:  Vitals:   12/12/21 1500 12/12/21 1534  BP: (!) 151/60 (!) 164/64  Pulse: 97 98  Resp: 13 20  Temp: 37.1 C 36.6 C  SpO2: 96% 98%    Last Pain:  Vitals:   12/12/21 1534  TempSrc: Temporal  PainSc: 1                  Stephanie Coup

## 2021-12-12 NOTE — Op Note (Signed)
Preoperative diagnosis: Cholelithiasis  Postoperative diagnosis: Cholelithiasis  Procedure: Robotic Assisted Laparoscopic Cholecystectomy.   Anesthesia: GETA   Surgeon: Dr. Hazle Quant  Wound Classification: Clean Contaminated  Indications: Patient is a 81 y.o. female developed right upper quadrant pain, nausea and on workup was found to have cholelithiasis with a normal common duct. Robotic Assisted Laparoscopic cholecystectomy was elected.  Findings: Chronic inflammatory changes Critical view of safety achieved Cystic duct and artery identified, ligated and divided Adequate hemostasis  Description of procedure: The patient was placed on the operating table in the supine position. General anesthesia was induced. A time-out was completed verifying correct patient, procedure, site, positioning, and implant(s) and/or special equipment prior to beginning this procedure. An orogastric tube was placed. The abdomen was prepped and draped in the usual sterile fashion.  An incision was made in a natural skin line below the umbilicus.  The fascia was elevated and the Veress needle inserted. Proper position was confirmed by aspiration and saline meniscus test.  The abdomen was insufflated with carbon dioxide to a pressure of 15 mmHg. The patient tolerated insufflation well. A 8-mm trocar was then inserted in optiview fashion.  The laparoscope was inserted and the abdomen inspected. No injuries from initial trocar placement were noted. Additional trocars were then inserted in the following locations: an 8-mm trocar in the left lateral abdomen, and another two 8-mm trocars to the right side of the abdomen 5 cm appart. The umbilical trocar was changed to a 12 mm trocar all under direct visualization. The abdomen was inspected and no abnormalities were found. The table was placed in the reverse Trendelenburg position with the right side up. The robotic arms were docked and target anatomy identified.  Instrument inserted under direct visualization.  Filmy adhesions between the gallbladder and omentum, duodenum and transverse colon were lysed with electrocautery. The dome of the gallbladder was grasped with a prograsp and retracted over the dome of the liver. The infundibulum was also grasped with an atraumatic grasper and retracted toward the right lower quadrant. This maneuver exposed Calot's triangle. The peritoneum overlying the gallbladder infundibulum was then incised and the cystic duct and cystic artery identified and circumferentially dissected. Critical view of safety reviewed before ligating any structure. Firefly images taken to visualize biliary ducts. The cystic duct and cystic artery were then doubly clipped and divided close to the gallbladder.  The gallbladder was then dissected from its peritoneal attachments by electrocautery. Hemostasis was checked and the gallbladder and contained stones were removed using an endoscopic retrieval bag. The gallbladder was passed off the table as a specimen. There was no evidence of bleeding from the gallbladder fossa or cystic artery or leakage of the bile from the cystic duct stump. Secondary trocars were removed under direct vision. No bleeding was noted. The robotic arms were undoked. The scope was withdrawn and the umbilical trocar removed. The abdomen was allowed to collapse. The fascia of the 14mm trocar sites was closed with figure-of-eight 0 vicryl sutures. The skin was closed with subcuticular sutures of 4-0 monocryl and topical skin adhesive. The orogastric tube was removed.  The patient tolerated the procedure well and was taken to the postanesthesia care unit in stable condition.   Specimen: Gallbladder  Complications: None  EBL: 5 mL

## 2021-12-12 NOTE — Anesthesia Preprocedure Evaluation (Signed)
Anesthesia Evaluation  Patient identified by MRN, date of birth, ID band Patient awake    Reviewed: Allergy & Precautions, H&P , NPO status , Patient's Chart, lab work & pertinent test results, reviewed documented beta blocker date and time   History of Anesthesia Complications Negative for: history of anesthetic complications  Airway Mallampati: II  TM Distance: >3 FB Neck ROM: full    Dental  (+) Edentulous Upper, Edentulous Lower   Pulmonary neg pulmonary ROS, neg COPD, neg recent URI   Pulmonary exam normal        Cardiovascular Exercise Tolerance: Good hypertension, (-) angina + CAD and + Cardiac Stents  (-) Past MI and (-) CABG negative cardio ROS Normal cardiovascular exam+ dysrhythmias (-) Valvular Problems/Murmurs     Neuro/Psych  Neuromuscular disease  negative psych ROS   GI/Hepatic negative GI ROS, Neg liver ROS,GERD  ,,  Endo/Other  negative endocrine ROS    Renal/GU Renal disease (kidney stones)  negative genitourinary   Musculoskeletal   Abdominal   Peds  Hematology negative hematology ROS (+)   Anesthesia Other Findings Patient has history of Parkinson's disease. Patient states she has a tremor in her RUE and that she salivates a lot. Took her medication for her salivating this morning.  Past Medical History: No date: Arthritis No date: Coronary artery disease No date: GERD (gastroesophageal reflux disease) No date: H/O vertigo No date: Heart disease No date: History of diverticulosis No date: History of kidney stones No date: History of seasonal allergies No date: Hypertension No date: Vascular disease   Reproductive/Obstetrics negative OB ROS                             Anesthesia Physical Anesthesia Plan  ASA: 3  Anesthesia Plan: General ETT   Post-op Pain Management:    Induction: Intravenous  PONV Risk Score and Plan: 4 or greater and Ondansetron and  Dexamethasone  Airway Management Planned: Oral ETT  Additional Equipment:   Intra-op Plan:   Post-operative Plan: Extubation in OR  Informed Consent: I have reviewed the patients History and Physical, chart, labs and discussed the procedure including the risks, benefits and alternatives for the proposed anesthesia with the patient or authorized representative who has indicated his/her understanding and acceptance.     Dental Advisory Given  Plan Discussed with: Anesthesiologist, CRNA and Surgeon  Anesthesia Plan Comments: (Patient consented for risks of anesthesia including but not limited to:  - adverse reactions to medications - damage to eyes, teeth, lips or other oral mucosa - nerve damage due to positioning  - sore throat or hoarseness - Damage to heart, brain, nerves, lungs, other parts of body or loss of life  Patient voiced understanding.)        Anesthesia Quick Evaluation

## 2021-12-12 NOTE — Interval H&P Note (Signed)
History and Physical Interval Note:  12/12/2021 10:22 AM  Katelyn Crawford  has presented today for surgery, with the diagnosis of K80.20 Cholelitiasis w/o cholecystitis.  The various methods of treatment have been discussed with the patient and family. After consideration of risks, benefits and other options for treatment, the patient has consented to  Procedure(s): XI ROBOTIC ASSISTED LAPAROSCOPIC CHOLECYSTECTOMY (N/A) INDOCYANINE GREEN FLUORESCENCE IMAGING (ICG) (N/A) as a surgical intervention.  The patient's history has been reviewed, patient examined, no change in status, stable for surgery.  I have reviewed the patient's chart and labs.  Questions were answered to the patient's satisfaction.     Carolan Shiver

## 2021-12-12 NOTE — Anesthesia Procedure Notes (Signed)
Procedure Name: Intubation Date/Time: 12/12/2021 12:55 PM  Performed by: Wilder Glade, CRNAPre-anesthesia Checklist: Patient identified, Emergency Drugs available, Patient being monitored, Suction available and Timeout performed Patient Re-evaluated:Patient Re-evaluated prior to induction Oxygen Delivery Method: Circle system utilized Preoxygenation: Pre-oxygenation with 100% oxygen Induction Type: IV induction Ventilation: Mask ventilation without difficulty Laryngoscope Size: Miller and 2 Grade View: Grade I Tube type: Oral Tube size: 6.5 mm Number of attempts: 1 Airway Equipment and Method: Stylet Placement Confirmation: ETT inserted through vocal cords under direct vision, positive ETCO2 and breath sounds checked- equal and bilateral Secured at: 19 cm Tube secured with: Tape Dental Injury: Teeth and Oropharynx as per pre-operative assessment

## 2021-12-14 LAB — SURGICAL PATHOLOGY

## 2022-01-07 ENCOUNTER — Emergency Department: Payer: Medicare Other

## 2022-01-07 DIAGNOSIS — Z8679 Personal history of other diseases of the circulatory system: Secondary | ICD-10-CM | POA: Insufficient documentation

## 2022-01-07 DIAGNOSIS — R197 Diarrhea, unspecified: Secondary | ICD-10-CM | POA: Insufficient documentation

## 2022-01-07 DIAGNOSIS — I130 Hypertensive heart and chronic kidney disease with heart failure and stage 1 through stage 4 chronic kidney disease, or unspecified chronic kidney disease: Secondary | ICD-10-CM | POA: Diagnosis not present

## 2022-01-07 DIAGNOSIS — R079 Chest pain, unspecified: Secondary | ICD-10-CM | POA: Diagnosis present

## 2022-01-07 DIAGNOSIS — R109 Unspecified abdominal pain: Secondary | ICD-10-CM | POA: Insufficient documentation

## 2022-01-07 DIAGNOSIS — I509 Heart failure, unspecified: Secondary | ICD-10-CM | POA: Diagnosis not present

## 2022-01-07 DIAGNOSIS — N189 Chronic kidney disease, unspecified: Secondary | ICD-10-CM | POA: Insufficient documentation

## 2022-01-07 LAB — COMPREHENSIVE METABOLIC PANEL
ALT: 12 U/L (ref 0–44)
AST: 27 U/L (ref 15–41)
Albumin: 4.1 g/dL (ref 3.5–5.0)
Alkaline Phosphatase: 71 U/L (ref 38–126)
Anion gap: 6 (ref 5–15)
BUN: 18 mg/dL (ref 8–23)
CO2: 25 mmol/L (ref 22–32)
Calcium: 9.5 mg/dL (ref 8.9–10.3)
Chloride: 103 mmol/L (ref 98–111)
Creatinine, Ser: 0.83 mg/dL (ref 0.44–1.00)
GFR, Estimated: >60 mL/min (ref >60)
Glucose, Bld: 118 mg/dL — ABNORMAL HIGH (ref 70–99)
Potassium: 4.4 mmol/L (ref 3.5–5.1)
Sodium: 134 mmol/L — ABNORMAL LOW (ref 135–145)
Total Bilirubin: 0.7 mg/dL (ref 0.3–1.2)
Total Protein: 6.7 g/dL (ref 6.5–8.1)

## 2022-01-07 LAB — URINALYSIS, ROUTINE W REFLEX MICROSCOPIC
Bilirubin Urine: NEGATIVE
Glucose, UA: NEGATIVE mg/dL
Hgb urine dipstick: NEGATIVE
Ketones, ur: 5 mg/dL — AB
Leukocytes,Ua: NEGATIVE
Nitrite: NEGATIVE
Protein, ur: NEGATIVE mg/dL
Specific Gravity, Urine: 1.01 (ref 1.005–1.030)
pH: 7 (ref 5.0–8.0)

## 2022-01-07 LAB — CBC WITH DIFFERENTIAL/PLATELET
Abs Immature Granulocytes: 0.03 10*3/uL (ref 0.00–0.07)
Basophils Absolute: 0.1 10*3/uL (ref 0.0–0.1)
Basophils Relative: 1 %
Eosinophils Absolute: 0.2 10*3/uL (ref 0.0–0.5)
Eosinophils Relative: 3 %
HCT: 34.8 % — ABNORMAL LOW (ref 36.0–46.0)
Hemoglobin: 11.4 g/dL — ABNORMAL LOW (ref 12.0–15.0)
Immature Granulocytes: 0 %
Lymphocytes Relative: 35 %
Lymphs Abs: 2.3 10*3/uL (ref 0.7–4.0)
MCH: 29.9 pg (ref 26.0–34.0)
MCHC: 32.8 g/dL (ref 30.0–36.0)
MCV: 91.3 fL (ref 80.0–100.0)
Monocytes Absolute: 0.7 10*3/uL (ref 0.1–1.0)
Monocytes Relative: 10 %
Neutro Abs: 3.5 10*3/uL (ref 1.7–7.7)
Neutrophils Relative %: 51 %
Platelets: 184 10*3/uL (ref 150–400)
RBC: 3.81 MIL/uL — ABNORMAL LOW (ref 3.87–5.11)
RDW: 13.2 % (ref 11.5–15.5)
WBC: 6.7 10*3/uL (ref 4.0–10.5)
nRBC: 0 % (ref 0.0–0.2)

## 2022-01-07 LAB — TROPONIN I (HIGH SENSITIVITY): Troponin I (High Sensitivity): 11 ng/L (ref <18)

## 2022-01-07 LAB — LIPASE, BLOOD: Lipase: 42 U/L (ref 11–51)

## 2022-01-07 NOTE — ED Provider Triage Note (Signed)
Emergency Medicine Provider Triage Evaluation Note  Katelyn Crawford , a 81 y.o. female  was evaluated in triage.  Pt complains of abd pain and chest pain. Nauseated but no  vomiting. One episode of diarrhea.  Review of Systems  Positive: Abd and chest pain Negative: Fever, cough, shob  Physical Exam  BP 139/78   Pulse 86   Temp 98.2 F (36.8 C)   Resp 16   Ht 4\' 8"  (1.422 m)   Wt 60.7 kg   SpO2 94%   BMI 30.00 kg/m  Gen:   Awake, no distress   Resp:  Normal effort  MSK:   Moves extremities without difficulty  Other:    Medical Decision Making  Medically screening exam initiated at 6:21 PM.  Appropriate orders placed.  CHESNEY SUARES was informed that the remainder of the evaluation will be completed by another provider, this initial triage assessment does not replace that evaluation, and the importance of remaining in the ED until their evaluation is complete.  Labs, EKG, urine, chest xray   Raechel Chute, PA-C 01/07/22 1823

## 2022-01-07 NOTE — ED Triage Notes (Signed)
Pt sts that she his having abd pain with diarrhea and her abd pain feels like hunger but than also has chest pain. Pt denies and fever, cough or vomiting.

## 2022-01-08 ENCOUNTER — Emergency Department: Payer: Medicare Other

## 2022-01-08 ENCOUNTER — Emergency Department
Admission: EM | Admit: 2022-01-08 | Discharge: 2022-01-08 | Disposition: A | Discharge status: Home / Self Care | Payer: Medicare Other | Attending: Emergency Medicine | Admitting: Emergency Medicine

## 2022-01-08 DIAGNOSIS — R109 Unspecified abdominal pain: Secondary | ICD-10-CM | POA: Diagnosis not present

## 2022-01-08 DIAGNOSIS — R197 Diarrhea, unspecified: Secondary | ICD-10-CM

## 2022-01-08 LAB — TROPONIN I (HIGH SENSITIVITY): Troponin I (High Sensitivity): 12 ng/L (ref <18)

## 2022-01-08 NOTE — ED Provider Notes (Signed)
[facility] Provider Note    Event Date/Time   First MD Initiated Contact with Patient 01/08/22 0115     (approximate)  History   Chief Complaint: Chest Pain  HPI  Katelyn Crawford is a 81 y.o. female with a past medical history of anemia, CAD, CHF, CKD, gastric reflux, hypertension, hyperlipidemia, presents emergency department for chest pressure and abdominal discomfort.  According to the patient since having her gallbladder removed 1 month ago she has had fairly frequent episodes of loose stool.  Patient states while using the bathroom earlier today she developed chest pressure that has since resolved.  Patient states an episode of diarrhea earlier today as well.  Patient denies any fever denies any cough or congestion.  Patient states she is feeling better.   Physical Exam   Triage Vital Signs: ED Triage Vitals  Enc Vitals Group     BP 01/07/22 1722 139/78     Pulse Rate 01/07/22 1722 86     Resp 01/07/22 1722 16     Temp 01/07/22 1722 98.2 F (36.8 C)     Temp Source 01/07/22 2340 Oral     SpO2 01/07/22 1722 94 %     Weight 01/07/22 1724 133 lb 12.8 oz (60.7 kg)     Height 01/07/22 1724 4\' 8"  (1.422 m)     Head Circumference --      Peak Flow --      Pain Score 01/07/22 1822 6     Pain Loc --      Pain Edu? --      Excl. in GC? --     Most recent vital signs: Vitals:   01/07/22 1722 01/07/22 2340  BP: 139/78 (!) 193/80  Pulse: 86 70  Resp: 16 18  Temp: 98.2 F (36.8 C) 98 F (36.7 C)  SpO2: 94% 98%    General: Awake, no distress.  CV:  Good peripheral perfusion.  Regular rate and rhythm  Resp:  Normal effort.  Equal breath sounds bilaterally.  Abd:  No distention.  Soft, slight left-sided tenderness.  No rebound or guarding.   ED Results / Procedures / Treatments   EKG  EKG viewed and interpreted by myself shows a normal sinus rhythm 85 bpm with a narrow QRS, left axis deviation, PR prolongation otherwise normal intervals and  nonspecific ST changes.  RADIOLOGY  I have reviewed and interpreted the chest x-ray images.  No obvious consolidation seen on my evaluation. Radiology is read the chest x-ray as negative   MEDICATIONS ORDERED IN ED: Medications - No data to display   IMPRESSION / MDM / ASSESSMENT AND PLAN / ED COURSE  I reviewed the triage vital signs and the nursing notes.  Patient's presentation is most consistent with acute presentation with potential threat to life or bodily function.  Patient presents to the emergency department for chest pressure as well as diarrhea and abdominal discomfort earlier today.  Overall the patient appears well, states her symptoms have resolved.  Overall the patient appears well on physical exam, she is hypertensive otherwise reassuring vital signs.  Patient's lab work has resulted showing a normal CBC with a normal white blood cell count, reassuring chemistry with normal renal function, normal urinalysis besides 5 ketones.  Patient's troponin is negative x 2 lipase is normal.  Patient's chest x-ray is negative and EKG reassuring.  However given the patient's mild left lower quadrant tenderness with diarrhea this morning I discussed pros and cons of CT  imaging.  Patient and daughter wish to proceed with CT scan to rule out intra-abdominal pathology.  I believe the patient CT scan is normal she can be discharged home with PCP follow-up.  Patient agreeable to this plan of care.  CT scan shows no significant findings.  Given the patient's reassuring workup I believe the patient is safe for discharge home with supportive care and PCP follow-up.  Patient agreeable to plan of care.  FINAL CLINICAL IMPRESSION(S) / ED DIAGNOSES   Abdominal pain Chest pain Diarrhea    Note:  This document was prepared using Dragon voice recognition software and may include unintentional dictation errors.   Minna Antis, MD 01/08/22 [phone]

## 2022-07-08 ENCOUNTER — Ambulatory Visit: Payer: Medicare Other

## 2022-07-08 ENCOUNTER — Ambulatory Visit: Payer: Medicare Other | Admitting: Speech Pathology

## 2022-07-10 ENCOUNTER — Encounter: Payer: Medicare Other | Admitting: Speech Pathology

## 2022-07-12 NOTE — Therapy (Unsigned)
OUTPATIENT PHYSICAL THERAPY NEURO EVALUATION   Patient Name: Katelyn Crawford MRN: [id number] DOB:01-19-1941, 82 y.o., female Today's Date: 07/15/2022   PCP:   Marguarite Arbour, MD   REFERRING PROVIDER: Lonell Face, MD   END OF SESSION:  PT End of Session - 07/15/22 0948     Visit Number 1    Number of Visits 24    Date for PT Re-Evaluation 10/07/22    Progress Note Due on Visit 10    PT Start Time 0845    PT Stop Time 0931    PT Time Calculation (min) 46 min    Equipment Utilized During Treatment Gait belt    Activity Tolerance Patient tolerated treatment well    Behavior During Therapy WFL for tasks assessed/performed             Past Medical History:  Diagnosis Date   Anemia    Aortic atherosclerosis (HCC)    Arthritis    Cardiomegaly    Cardiomyopathy (HCC)    Cholelithiasis    Chronic kidney disease, stage 2 (mild)    Chronic sinus bradycardia    Coronary artery disease 03/10/2012   a.) LHC 03/10/2012: 40% mLAD, 50% pRI, 50% pRCA, 90% mRCA, 50% dRCA, 50% RPDA - unable to cannulate RCA - med mgmt; b.) LHC/PCI 06/25/2013: 30% pLAD, 40% mLAD, 20% mLCx, 30% RI, 30% pRCA, 70% mRCA (2.5 mm Promus DES), 40% dRCA-1, 90% dRCA-2 (2.5 mm Promus DES)   DDD (degenerative disc disease), thoracolumbar    Diverticulosis    Dizziness    Environmental allergies    Full dentures    GERD (gastroesophageal reflux disease) 06/25/2013   H/O bilateral cataract extraction 2019   History of diverticulosis    History of kidney stones    HLD (hyperlipidemia)    Hyperlipidemia, unspecified    Hypertension    Nephrolithiasis    Osteoporosis, post-menopausal    Parkinson disease    Pre-diabetes    PVC's (premature ventricular contractions)    Scoliosis    Thoracic kyphosis    Trifascicular bundle branch block    a.) 1st degree AVB + RBBB + LAFB   Vascular disease    Past Surgical History:  Procedure Laterality Date   APPENDECTOMY     BREAST EXCISIONAL BIOPSY Left 1984    neg   BREAST EXCISIONAL BIOPSY Right 1997   lipoma   CATARACT EXTRACTION W/PHACO Left 09/02/2017   Procedure: CATARACT EXTRACTION PHACO AND INTRAOCULAR LENS PLACEMENT (IOC)  LEFT;  Surgeon: Lockie Mola, MD;  Location: [facility];  Service: Ophthalmology;  Laterality: Left;   CATARACT EXTRACTION W/PHACO Right 10/01/2017   Procedure: CATARACT EXTRACTION PHACO AND INTRAOCULAR LENS PLACEMENT (IOC) RIGHT;  Surgeon: Lockie Mola, MD;  Location: [facility];  Service: Ophthalmology;  Laterality: Right;   COLONOSCOPY WITH PROPOFOL N/A 11/27/2016   Procedure: COLONOSCOPY WITH PROPOFOL;  Surgeon: Toledo, Boykin Nearing, MD;  Location: [facility];  Service: Gastroenterology;  Laterality: N/A;   CORONARY ANGIOPLASTY WITH STENT PLACEMENT Left 06/25/2013   Procedure: CORONARY ANGIOPLASTY WITH STENT PLACEMENT; Location: [facility]; Surgeon: Rolly Salter, MD   KNEE ARTHROPLASTY Left 01/22/2017   Procedure: COMPUTER ASSISTED TOTAL KNEE ARTHROPLASTY;  Surgeon: Donato Heinz, MD;  Location: [facility];  Service: Orthopedics;  Laterality: Left;   KNEE ARTHROSCOPY Left 02/01/2015   Procedure: ARTHROSCOPY KNEE, partial medial & lateral menisectomy,,medial and patelofemoral chondroplasty;  Surgeon: Donato Heinz, MD;  Location: [facility];  Service: Orthopedics;  Laterality: Left;   LEFT  HEART CATH AND CORONARY ANGIOGRAPHY Left 03/10/2012   Procedure: LEFT HEART CATH AND CORONARY ANGIOGRAPHY; Location: [facility]; Surgeon: Marcina Millard, MD   LITHOTRIPSY Right 1995   RIGHT AXILLARY LIPOMA REMOVAL     TUBAL LIGATION     VARICOSE VEIN SURGERY     Patient Active Problem List   Diagnosis Date Noted   Primary osteoarthritis of hip 02/09/2017   Primary osteoarthritis of knee 02/09/2017   Chronic sinus bradycardia 01/22/2017   Degenerative disc disease, lumbar 01/22/2017   Dizziness 01/22/2017   Nephrolithiasis 01/22/2017   Osteoporosis, 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/22/2017   PVC's (premature  ventricular contractions) 01/22/2017   S/P total knee arthroplasty 01/22/2017   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   CKD (chronic kidney disease), 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   GERD (gastroesophageal reflux disease) 06/25/2013   Coronary artery disease involving native coronary artery of native heart 06/24/2013   Essential hypertension 06/21/2013   Mixed hyperlipidemia 06/21/2013    ONSET DATE: March 2019  REFERRING DIAG: Parkinson's Disease  THERAPY DIAG:  Parkinson's disease, unspecified whether dyskinesia present, unspecified whether manifestations fluctuate  Unsteadiness on feet  Other abnormalities of gait and mobility  Muscle weakness (generalized)  Rationale for Evaluation and Treatment: Rehabilitation  SUBJECTIVE:                                                                                                                                                                                             SUBJECTIVE STATEMENT: Patient reports feeling generally weak throughout the day, primarily when doing tasks for longer periods of time like fixing lunch for family on Sundays. Currently taking medication for Parkinson's diagnosis x4 a day, took her most recent dosage this morning around 6:45 AM. Pt also notes DTR having her drinking protein shakes as of late, pt reported feeling generally better than before throughout the day. She states she tends to lean towards the right when walking, she often has to stand for a few seconds to stabilize herself before she starts walking (balance related). She also reports becoming slightly off balance when turning quickly.  Pt accompanied by: self  PERTINENT HISTORY: Patient reporting to physical therapy for general unsteadiness and weakness as it relates to Parkinson's disease. Has been seen in the past for PT in 2019. PMH includes: Parkinson's disease, Vascular disease, Dizziness, DDD, GERD, arthritis, CKD, CAD. Reports no falls in the past 6  months. Is currently medicated for Parkinson's; taking medication 4x a day. She notices her biggest struggle is doing activities for longer durations of time. She also has concerns of feeling off balance when reacting quickly to environment.   PAIN:  Are  you having pain? No  PRECAUTIONS: None  WEIGHT BEARING RESTRICTIONS: No  FALLS: Has patient fallen in last 6 months? No  LIVING ENVIRONMENT: Lives with: lives alone and daughter lives next door. Lives in: House/apartment Stairs: Yes: External: 4 steps; on right going up and on left going up Has following equipment at home:  None, but has use cane in the past. Has shower rails, shower chair; doesn't use  PLOF: Independent with basic ADLs, Independent with household mobility without device, Independent with gait, and Independent with transfers  PATIENT GOALS: Get stronger, be able to stand long enough to cook lunch/ meals  OBJECTIVE:   DIAGNOSTIC FINDINGS:   MRI THORACIC SPINE WITHOUT CONTRAST: IMPRESSION: 1. Scoliosis of the thoracic spine is convex to the left at T3. 2. Exaggerated thoracic kyphosis is stable 3. Mild right foraminal narrowing at T2-3 and T3-4. 4. Mild disc bulging and facet hypertrophy at T6-7, T7-8, T10-11, and T11-12 without significant stenosis. 5. Chronic fatty endplate marrow changes across the endplates T4-5 through T11-12 with the exception of T6-7 and T7-8 which changes are more edematous.  CHEST - 2 VIEW :IMPRESSION: No active cardiopulmonary disease.  COGNITION: Overall cognitive status: Within functional limits for tasks assessed    POSTURE: rounded shoulders, forward head, and increased thoracic kyphosis    LOWER EXTREMITY MMT:    MMT Right Eval Left Eval  Hip flexion 4- 4  Hip extension    Hip abduction 4 4  Hip adduction 4- 4-  Hip internal rotation    Hip external rotation    Knee flexion 4- 4-  Knee extension 4- 4  Ankle dorsiflexion 4- 4-  Ankle plantarflexion    Ankle  inversion    Ankle eversion    (Blank rows = not tested)   GAIT: Gait pattern:  Pt ability to walk in straight line diminished as progressed, primarily drifting to R side,  and decreased stride length Distance walked: 937 ft Assistive device utilized: None Level of assistance: CGA Comments: Pt walking at slow pace, though able to continuously ambulate throughout the 6 mins.  FUNCTIONAL TESTS:  5 times sit to stand: 23.99 sec Timed up and go (TUG): 14.38 sec; 2nd attempt, needed additional cue for correct completion of the test. 6 minute walk test: 937 ft, see additional comment per Gait assessment above. 10 meter walk test: 11.32 secs; 0.88 m/s  PATIENT SURVEYS:  FOTO 53  TODAY'S TREATMENT:                                                                                                                              DATE: 07/15/22   Initial evaluation preformed; Tests and measures done: 5XSTS, , TUG, . See information on results per Goals section.  PATIENT EDUCATION: Education details: Educated on instruction for tests and measures, outcome measures, and POC. Person educated: Patient Education method: Medical illustrator Education comprehension: verbalized understanding  HOME EXERCISE PROGRAM: Not given today, address  session 2  GOALS: Goals reviewed with patient? No  SHORT TERM GOALS: Target date: 08/12/2022    Patient will be independent in home exercise program to improve strength/mobility for better functional independence with ADLs. Baseline: No HEP currently  Goal status: INITIAL  LONG TERM GOALS: Target date: 10/07/2022   1.  Patient (> 74 years old) will complete five times sit to stand test in < 15 seconds indicating an increased LE strength and improved balance. Baseline: 23.99 sec Goal status: INITIAL  2.  Patient will increase FOTO score to equal to or greater than  59  to demonstrate statistically significant improvement in mobility  and quality of life.  Baseline: 53 Goal status: INITIAL   3.  Patient will increase Berg Balance score by > 6 points to demonstrate decreased fall risk during functional activities. Baseline: Assess 2nd session Goal status: INITIAL   4.   Patient will reduce timed up and go to <11 seconds to reduce fall risk and demonstrate improved transfer/gait ability. Baseline: 14.38 Goal status: INITIAL  5.   Patient will increase 10 meter walk test to >1.82m/s as to improve gait speed for better community ambulation and to reduce fall risk. Baseline: 0.88 m/s Goal status: INITIAL  6.   Patient will increase six minute walk test distance to >1000 for progression to community ambulator and improve gait ability Baseline: 937 feet Goal status: INITIAL     ASSESSMENT:  CLINICAL IMPRESSION: Patient is a 82 y.o. Female who was seen today for physical therapy evaluation and treatment for general LE weakness and unsteadiness as related to Parkinson's Disease. Pt shows increased risk for falls and general balance impairments based on TUG test. Also presents with impaired LE strength and power AEB her 5X STS time. Pt will benefit from various static/dynamic balance training activities and general endurance with longer spans of activity. Patient benefit from skilled physical therapy intervention to address impairments, improve QOL, and attain therapy goals.    OBJECTIVE IMPAIRMENTS: decreased balance, decreased endurance, decreased strength, and Abnormal Gait; R side drift .   ACTIVITY LIMITATIONS: bending, standing, and locomotion level  PARTICIPATION LIMITATIONS: meal prep, cleaning, and community activity  PERSONAL FACTORS: Age, Fitness, and Past/current experiences are also affecting patient's functional outcome.   REHAB POTENTIAL: Good  CLINICAL DECISION MAKING: Stable/uncomplicated  EVALUATION COMPLEXITY: Low  PLAN:  PT FREQUENCY: 1-2x/week  PT DURATION: 12 weeks  PLANNED  INTERVENTIONS: Therapeutic exercises, Therapeutic activity, Neuromuscular re-education, Balance training, Gait training, Patient/Family education, Self Care, Joint mobilization, Vestibular training, Cryotherapy, Moist heat, and Re-evaluation  PLAN FOR NEXT SESSION: Introduce and address HEP as needed, Complete remaining Tests and measures per initial evaluation; BERG   Nani Gasser, Student-PT 07/15/2022, 4:42 PM   I have read and reviewed the attached note and am in agreement with the documentation provided.     This licensed clinician was present and actively directing care throughout the session at all times.  Grier Rocher PT, DPT  Physical Therapist - [place]  [facility]  4:44 PM 07/15/22

## 2022-07-15 ENCOUNTER — Ambulatory Visit: Payer: Medicare Other | Attending: Neurology | Admitting: Physical Therapy

## 2022-07-15 ENCOUNTER — Encounter: Payer: Self-pay | Admitting: Physical Therapy

## 2022-07-15 DIAGNOSIS — R2689 Other abnormalities of gait and mobility: Secondary | ICD-10-CM | POA: Diagnosis present

## 2022-07-15 DIAGNOSIS — G20A1 Parkinson's disease without dyskinesia, without mention of fluctuations: Secondary | ICD-10-CM | POA: Diagnosis present

## 2022-07-15 DIAGNOSIS — M6281 Muscle weakness (generalized): Secondary | ICD-10-CM | POA: Diagnosis present

## 2022-07-15 DIAGNOSIS — R2681 Unsteadiness on feet: Secondary | ICD-10-CM | POA: Diagnosis present

## 2022-07-17 ENCOUNTER — Ambulatory Visit: Payer: Medicare Other | Admitting: Physical Therapy

## 2022-07-17 DIAGNOSIS — R2689 Other abnormalities of gait and mobility: Secondary | ICD-10-CM

## 2022-07-17 DIAGNOSIS — R2681 Unsteadiness on feet: Secondary | ICD-10-CM

## 2022-07-17 DIAGNOSIS — G20A1 Parkinson's disease without dyskinesia, without mention of fluctuations: Secondary | ICD-10-CM

## 2022-07-17 DIAGNOSIS — M6281 Muscle weakness (generalized): Secondary | ICD-10-CM

## 2022-07-17 NOTE — Therapy (Signed)
OUTPATIENT PHYSICAL THERAPY NEURO TREATMENT NOTE   Patient Name: SERAI TUKES MRN: [id number] DOB:[birth year], 82 y.o., female Today's Date: 07/17/2022   PCP:   Marguarite Arbour, MD   REFERRING PROVIDER: Lonell Face, MD   END OF SESSION:  PT End of Session - 07/17/22 1245     Visit Number 2    Number of Visits 24    Date for PT Re-Evaluation 10/07/22    Progress Note Due on Visit 10    PT Start Time 1145    PT Stop Time 1230    PT Time Calculation (min) 45 min    Equipment Utilized During Treatment Gait belt    Activity Tolerance Patient tolerated treatment well    Behavior During Therapy WFL for tasks assessed/performed              Past Medical History:  Diagnosis Date   Anemia    Aortic atherosclerosis (HCC)    Arthritis    Cardiomegaly    Cardiomyopathy (HCC)    Cholelithiasis    Chronic kidney disease, stage 2 (mild)    Chronic sinus bradycardia    Coronary artery disease 03/10/2012   a.) LHC 03/10/2012: 40% mLAD, 50% pRI, 50% pRCA, 90% mRCA, 50% dRCA, 50% RPDA - unable to cannulate RCA - med mgmt; b.) LHC/PCI 06/25/2013: 30% pLAD, 40% mLAD, 20% mLCx, 30% RI, 30% pRCA, 70% mRCA (2.5 mm Promus DES), 40% dRCA-1, 90% dRCA-2 (2.5 mm Promus DES)   DDD (degenerative disc disease), thoracolumbar    Diverticulosis    Dizziness    Environmental allergies    Full dentures    GERD (gastroesophageal reflux disease) 06/25/2013   H/O bilateral cataract extraction 2019   History of diverticulosis    History of kidney stones    HLD (hyperlipidemia)    Hyperlipidemia, unspecified    Hypertension    Nephrolithiasis    Osteoporosis, post-menopausal    Parkinson disease    Pre-diabetes    PVC's (premature ventricular contractions)    Scoliosis    Thoracic kyphosis    Trifascicular bundle branch block    a.) 1st degree AVB + RBBB + LAFB   Vascular disease    Past Surgical History:  Procedure Laterality Date   APPENDECTOMY     BREAST EXCISIONAL BIOPSY Left  1984   neg   BREAST EXCISIONAL BIOPSY Right 1997   lipoma   CATARACT EXTRACTION W/PHACO Left 09/02/2017   Procedure: CATARACT EXTRACTION PHACO AND INTRAOCULAR LENS PLACEMENT (IOC)  LEFT;  Surgeon: Lockie Mola, MD;  Location: [facility];  Service: Ophthalmology;  Laterality: Left;   CATARACT EXTRACTION W/PHACO Right 10/01/2017   Procedure: CATARACT EXTRACTION PHACO AND INTRAOCULAR LENS PLACEMENT (IOC) RIGHT;  Surgeon: Lockie Mola, MD;  Location: [facility];  Service: Ophthalmology;  Laterality: Right;   COLONOSCOPY WITH PROPOFOL N/A 11/27/2016   Procedure: COLONOSCOPY WITH PROPOFOL;  Surgeon: Toledo, Boykin Nearing, MD;  Location: [facility];  Service: Gastroenterology;  Laterality: N/A;   CORONARY ANGIOPLASTY WITH STENT PLACEMENT Left 06/25/2013   Procedure: CORONARY ANGIOPLASTY WITH STENT PLACEMENT; Location: [facility]; Surgeon: Rolly Salter, MD   KNEE ARTHROPLASTY Left 01/22/2017   Procedure: COMPUTER ASSISTED TOTAL KNEE ARTHROPLASTY;  Surgeon: Donato Heinz, MD;  Location: [facility];  Service: Orthopedics;  Laterality: Left;   KNEE ARTHROSCOPY Left 02/01/2015   Procedure: ARTHROSCOPY KNEE, partial medial & lateral menisectomy,,medial and patelofemoral chondroplasty;  Surgeon: Donato Heinz, MD;  Location: [facility];  Service: Orthopedics;  Laterality: Left;  LEFT HEART CATH AND CORONARY ANGIOGRAPHY Left 03/10/2012   Procedure: LEFT HEART CATH AND CORONARY ANGIOGRAPHY; Location: [facility]; Surgeon: Marcina Millard, MD   LITHOTRIPSY Right 1995   RIGHT AXILLARY LIPOMA REMOVAL     TUBAL LIGATION     VARICOSE VEIN SURGERY     Patient Active Problem List   Diagnosis Date Noted   Primary osteoarthritis of hip 02/09/2017   Primary osteoarthritis of knee 02/09/2017   Chronic sinus bradycardia 01/22/2017   Degenerative disc disease, lumbar 01/22/2017   Dizziness 01/22/2017   Nephrolithiasis 01/22/2017   Osteoporosis, 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/22/2017   PVC's (premature  ventricular contractions) 01/22/2017   S/P total knee arthroplasty 01/22/2017   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   CKD (chronic kidney disease), 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   GERD (gastroesophageal reflux disease) 06/25/2013   Coronary artery disease involving native coronary artery of native heart 06/24/2013   Essential hypertension 06/21/2013   Mixed hyperlipidemia 06/21/2013    ONSET DATE: March 2019  REFERRING DIAG: Parkinson's Disease  THERAPY DIAG:  Parkinson's disease, unspecified whether dyskinesia present, unspecified whether manifestations fluctuate  Unsteadiness on feet  Other abnormalities of gait and mobility  Muscle weakness (generalized)  Rationale for Evaluation and Treatment: Rehabilitation  SUBJECTIVE:                                                                                                                                                                                             SUBJECTIVE STATEMENT: Pt reports doing well since last session, notes no falls/stumbles; no significant changes either.  Pt accompanied by: self  PERTINENT HISTORY: Patient reporting to physical therapy for general unsteadiness and weakness as it relates to Parkinson's disease. Has been seen in the past for PT in 2019. PMH includes: Parkinson's disease, Vascular disease, Dizziness, DDD, GERD, arthritis, CKD, CAD. Reports no falls in the past 6 months. Is currently medicated for Parkinson's; taking medication 4x a day. She notices her biggest struggle is doing activities for longer durations of time. She also has concerns of feeling off balance when reacting quickly to environment.   PAIN:  Are you having pain? No  PRECAUTIONS: None  WEIGHT BEARING RESTRICTIONS: No  FALLS: Has patient fallen in last 6 months? No  LIVING ENVIRONMENT: Lives with: lives alone and daughter lives next door. Lives in: House/apartment Stairs: Yes: External: 4 steps; on right going up and on  left going up Has following equipment at home:  None, but has use cane in the past. Has shower rails, shower chair; doesn't use  PLOF: Independent with basic ADLs, Independent with household mobility without device, Independent with gait, and Independent  with transfers  PATIENT GOALS: Get stronger, be able to stand long enough to cook lunch/ meals  OBJECTIVE:   DIAGNOSTIC FINDINGS:   MRI THORACIC SPINE WITHOUT CONTRAST: IMPRESSION: 1. Scoliosis of the thoracic spine is convex to the left at T3. 2. Exaggerated thoracic kyphosis is stable 3. Mild right foraminal narrowing at T2-3 and T3-4. 4. Mild disc bulging and facet hypertrophy at T6-7, T7-8, T10-11, and T11-12 without significant stenosis. 5. Chronic fatty endplate marrow changes across the endplates T4-5 through T11-12 with the exception of T6-7 and T7-8 which changes are more edematous.  CHEST - 2 VIEW :IMPRESSION: No active cardiopulmonary disease.  COGNITION: Overall cognitive status: Within functional limits for tasks assessed    POSTURE: rounded shoulders, forward head, and increased thoracic kyphosis    LOWER EXTREMITY MMT:    MMT Right Eval Left Eval  Hip flexion 4- 4  Hip extension    Hip abduction 4 4  Hip adduction 4- 4-  Hip internal rotation    Hip external rotation    Knee flexion 4- 4-  Knee extension 4- 4  Ankle dorsiflexion 4- 4-  Ankle plantarflexion    Ankle inversion    Ankle eversion    (Blank rows = not tested)   GAIT: Gait pattern:  Pt ability to walk in straight line diminished as progressed, primarily drifting to R side,  and decreased stride length Distance walked: 937 ft Assistive device utilized: None Level of assistance: CGA Comments: Pt walking at slow pace, though able to continuously ambulate throughout the 6 mins.  FUNCTIONAL TESTS:  5 times sit to stand: 23.99 sec Timed up and go (TUG): 14.38 sec; 2nd attempt, needed additional cue for correct completion of the  test. 6 minute walk test: 937 ft, see additional comment per Gait assessment above. 10 meter walk test: 11.32 secs; 0.88 m/s Berg Balance Scale: 40, see details below, assessed 2nd session  [facility] PT Assessment - 07/17/22 0001       Standardized Balance Assessment   Standardized Balance Assessment Berg Balance Test      Berg Balance Test   Sit to Stand Able to stand  independently using hands    Standing Unsupported Able to stand safely 2 minutes    Sitting with Back Unsupported but Feet Supported on Floor or Stool Able to sit safely and securely 2 minutes    Stand to Sit Controls descent by using hands    Transfers Able to transfer safely, definite need of hands    Standing Unsupported with Eyes Closed Able to stand 10 seconds safely    Standing Unsupported with Feet Together Able to place feet together independently and stand for 1 minute with supervision    From Standing, Reach Forward with Outstretched Arm Can reach forward >12 cm safely (5")    From Standing Position, Pick up Object from Floor Able to pick up shoe, needs supervision    From Standing Position, Turn to Look Behind Over each Shoulder Looks behind one side only/other side shows less weight shift    Turn 360 Degrees Able to turn 360 degrees safely but slowly    Standing Unsupported, Alternately Place Feet on Step/Stool Able to complete 4 steps without aid or supervision    Standing Unsupported, One Foot in Front Able to take small step independently and hold 30 seconds    Standing on One Leg Tries to lift leg/unable to hold 3 seconds but remains standing independently    Total Score 40  PATIENT SURVEYS:  FOTO 53  TODAY'S TREATMENT:                                                                                                                              DATE: 07/17/22   NMR: -Began session with finalization of Tests and measures; assessment of BERG balance test. -Standing SLS hold for 3 secs, 10 x  ea side, with UE support -Static standing on airex pad 1 x 1 min  TE:  -Seated LAQ, 3 sec holds 10 x ea side  -Standing hip ABD at balance bar, 1 x down and back with RTB around ankle -Seated Hip ABD with RTB around knees 2 x 10 reps, cues for 2-3 se holds.   PATIENT EDUCATION: Education details: Educated on instruction for tests and measures, outcome measures, and POC. Person educated: Patient Education method: Medical illustrator Education comprehension: verbalized understanding  HOME EXERCISE PROGRAM: Access Code: [facility] [email or web address]  Date: 07/17/2022 Prepared by: Grier Rocher Exercises - Standing Single Leg Stance with Counter Support  - 1 x daily - 7 x weekly - 3 sets - 10 reps - 2 hold -  Seated Hip Abduction with Resistance  - 1 x daily - 7 x weekly - 3 sets - 10 reps -  Seated Long Arc Quad  - 1 x daily - 7 x weekly - 3 sets - 10 reps - 3 hold    GOALS: Goals reviewed with patient? No  SHORT TERM GOALS: Target date: 08/12/2022    Patient will be independent in home exercise program to improve strength/mobility for better functional independence with ADLs. Baseline: No HEP currently  Goal status: INITIAL  LONG TERM GOALS: Target date: 10/07/2022   1.  Patient (> 83 years old) will complete five times sit to stand test in < 15 seconds indicating an increased LE strength and improved balance. Baseline: 23.99 sec Goal status: INITIAL  2.  Patient will increase FOTO score to equal to or greater than  59  to demonstrate statistically significant improvement in mobility and quality of life.  Baseline: 53 Goal status: INITIAL   3.  Patient will increase Berg Balance score by > 6 points to demonstrate decreased fall risk during functional activities. Baseline: Assessed 2nd session: 40 Goal status: INITIAL   4.   Patient will reduce timed up and go to <11 seconds to reduce fall risk and demonstrate improved transfer/gait  ability. Baseline: 14.38 Goal status: INITIAL  5.   Patient will increase 10 meter walk test to >1.66m/s as to improve gait speed for better community ambulation and to reduce fall risk. Baseline: 0.88 m/s Goal status: INITIAL  6.   Patient will increase six minute walk test distance to >1000 for progression to community ambulator and improve gait ability Baseline: 937 feet Goal status: INITIAL     ASSESSMENT:  CLINICAL IMPRESSION: Patient appeared motivated and ready for treatment on this day. Today's session focused in incorporation  if initial HEP per above. Also preformed Berg balance assessment, with pt showing mild deficits in general and dynamic balance. Pt responded well thus for all exercises/interventions given today. Plan to further progress and challenge pt with dynamic balance and strength as it relates to pt's baseline function. Pt will continue to benefit from skilled physical therapy intervention to address impairments, improve QOL, and attain therapy goals.   OBJECTIVE IMPAIRMENTS: decreased balance, decreased endurance, decreased strength, and Abnormal Gait; R side drift .   ACTIVITY LIMITATIONS: bending, standing, and locomotion level  PARTICIPATION LIMITATIONS: meal prep, cleaning, and community activity  PERSONAL FACTORS: Age, Fitness, and Past/current experiences are also affecting patient's functional outcome.   REHAB POTENTIAL: Good  CLINICAL DECISION MAKING: Stable/uncomplicated  EVALUATION COMPLEXITY: Low  PLAN:  PT FREQUENCY: 1-2x/week  PT DURATION: 12 weeks  PLANNED INTERVENTIONS: Therapeutic exercises, Therapeutic activity, Neuromuscular re-education, Balance training, Gait training, Patient/Family education, Self Care, Joint mobilization, Vestibular training, Cryotherapy, Moist heat, and Re-evaluation  PLAN FOR NEXT SESSION: Introduce and address HEP as needed, Complete remaining Tests and measures per initial evaluation; BERG   Nani Gasser,  Student-PT 07/17/2022, 12:53 PM   I have read and reviewed the attached note and am in agreement with the documentation provided.     This licensed clinician was present and actively directing care throughout the session at all times.  Grier Rocher PT, DPT  Physical Therapist - [facility]  12:53 PM 07/17/22

## 2022-07-23 ENCOUNTER — Ambulatory Visit: Payer: Medicare Other | Attending: Internal Medicine | Admitting: Physical Therapy

## 2022-07-23 DIAGNOSIS — R471 Dysarthria and anarthria: Secondary | ICD-10-CM | POA: Diagnosis present

## 2022-07-23 DIAGNOSIS — R2681 Unsteadiness on feet: Secondary | ICD-10-CM | POA: Diagnosis present

## 2022-07-23 DIAGNOSIS — R2689 Other abnormalities of gait and mobility: Secondary | ICD-10-CM | POA: Insufficient documentation

## 2022-07-23 DIAGNOSIS — M6281 Muscle weakness (generalized): Secondary | ICD-10-CM | POA: Insufficient documentation

## 2022-07-23 DIAGNOSIS — G20A1 Parkinson's disease without dyskinesia, without mention of fluctuations: Secondary | ICD-10-CM | POA: Diagnosis present

## 2022-07-23 NOTE — Therapy (Signed)
OUTPATIENT PHYSICAL THERAPY NEURO TREATMENT NOTE   Patient Name: Katelyn Crawford MRN: [id number] DOB:November 06, 1940, 82 y.o., female Today's Date: 07/23/2022   PCP:   Marguarite Arbour, MD   REFERRING PROVIDER: Lonell Face, MD   END OF SESSION:  PT End of Session - 07/23/22 1340     Visit Number 3    Number of Visits 24    Date for PT Re-Evaluation 10/07/22    Progress Note Due on Visit 10    PT Start Time 1345    PT Stop Time 1428    PT Time Calculation (min) 43 min    Equipment Utilized During Treatment Gait belt    Activity Tolerance Patient tolerated treatment well    Behavior During Therapy WFL for tasks assessed/performed               Past Medical History:  Diagnosis Date   Anemia    Aortic atherosclerosis (HCC)    Arthritis    Cardiomegaly    Cardiomyopathy (HCC)    Cholelithiasis    Chronic kidney disease, stage 2 (mild)    Chronic sinus bradycardia    Coronary artery disease 03/10/2012   a.) LHC 03/10/2012: 40% mLAD, 50% pRI, 50% pRCA, 90% mRCA, 50% dRCA, 50% RPDA - unable to cannulate RCA - med mgmt; b.) LHC/PCI 06/25/2013: 30% pLAD, 40% mLAD, 20% mLCx, 30% RI, 30% pRCA, 70% mRCA (2.5 mm Promus DES), 40% dRCA-1, 90% dRCA-2 (2.5 mm Promus DES)   DDD (degenerative disc disease), thoracolumbar    Diverticulosis    Dizziness    Environmental allergies    Full dentures    GERD (gastroesophageal reflux disease) 06/25/2013   H/O bilateral cataract extraction 2019   History of diverticulosis    History of kidney stones    HLD (hyperlipidemia)    Hyperlipidemia, unspecified    Hypertension    Nephrolithiasis    Osteoporosis, post-menopausal    Parkinson disease    Pre-diabetes    PVC's (premature ventricular contractions)    Scoliosis    Thoracic kyphosis    Trifascicular bundle branch block    a.) 1st degree AVB + RBBB + LAFB   Vascular disease    Past Surgical History:  Procedure Laterality Date   APPENDECTOMY     BREAST EXCISIONAL BIOPSY Left  1984   neg   BREAST EXCISIONAL BIOPSY Right 1997   lipoma   CATARACT EXTRACTION W/PHACO Left 09/02/2017   Procedure: CATARACT EXTRACTION PHACO AND INTRAOCULAR LENS PLACEMENT (IOC)  LEFT;  Surgeon: Lockie Mola, MD;  Location: [facility];  Service: Ophthalmology;  Laterality: Left;   CATARACT EXTRACTION W/PHACO Right 10/01/2017   Procedure: CATARACT EXTRACTION PHACO AND INTRAOCULAR LENS PLACEMENT (IOC) RIGHT;  Surgeon: Lockie Mola, MD;  Location: [facility];  Service: Ophthalmology;  Laterality: Right;   COLONOSCOPY WITH PROPOFOL N/A 11/27/2016   Procedure: COLONOSCOPY WITH PROPOFOL;  Surgeon: Toledo, Boykin Nearing, MD;  Location: [facility];  Service: Gastroenterology;  Laterality: N/A;   CORONARY ANGIOPLASTY WITH STENT PLACEMENT Left 06/25/2013   Procedure: CORONARY ANGIOPLASTY WITH STENT PLACEMENT; Location: [facility]; Surgeon: Rolly Salter, MD   KNEE ARTHROPLASTY Left 01/22/2017   Procedure: COMPUTER ASSISTED TOTAL KNEE ARTHROPLASTY;  Surgeon: Donato Heinz, MD;  Location: [facility];  Service: Orthopedics;  Laterality: Left;   KNEE ARTHROSCOPY Left 02/01/2015   Procedure: ARTHROSCOPY KNEE, partial medial & lateral menisectomy,,medial and patelofemoral chondroplasty;  Surgeon: Donato Heinz, MD;  Location: [facility];  Service: Orthopedics;  Laterality: Left;  LEFT HEART CATH AND CORONARY ANGIOGRAPHY Left 03/10/2012   Procedure: LEFT HEART CATH AND CORONARY ANGIOGRAPHY; Location: [facility]; Surgeon: Marcina Millard, MD   LITHOTRIPSY Right 1995   RIGHT AXILLARY LIPOMA REMOVAL     TUBAL LIGATION     VARICOSE VEIN SURGERY     Patient Active Problem List   Diagnosis Date Noted   Primary osteoarthritis of hip 02/09/2017   Primary osteoarthritis of knee 02/09/2017   Chronic sinus bradycardia 01/22/2017   Degenerative disc disease, lumbar 01/22/2017   Dizziness 01/22/2017   Nephrolithiasis 01/22/2017   Osteoporosis, 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/22/2017   PVC's (premature  ventricular contractions) 01/22/2017   S/P total knee arthroplasty 01/22/2017   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   CKD (chronic kidney disease), 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   GERD (gastroesophageal reflux disease) 06/25/2013   Coronary artery disease involving native coronary artery of native heart 06/24/2013   Essential hypertension 06/21/2013   Mixed hyperlipidemia 06/21/2013    ONSET DATE: March 2019  REFERRING DIAG: Parkinson's Disease  THERAPY DIAG:  Parkinson's disease, unspecified whether dyskinesia present, unspecified whether manifestations fluctuate  Unsteadiness on feet  Muscle weakness (generalized)  Other abnormalities of gait and mobility  Rationale for Evaluation and Treatment: Rehabilitation  SUBJECTIVE:                                                                                                                                                                                             SUBJECTIVE STATEMENT: Pt reports doing well since last session, notes no falls/stumbles; no significant changes either.  Pt accompanied by: self  PERTINENT HISTORY: Patient reporting to physical therapy for general unsteadiness and weakness as it relates to Parkinson's disease. Has been seen in the past for PT in 2019. PMH includes: Parkinson's disease, Vascular disease, Dizziness, DDD, GERD, arthritis, CKD, CAD. Reports no falls in the past 6 months. Is currently medicated for Parkinson's; taking medication 4x a day. She notices her biggest struggle is doing activities for longer durations of time. She also has concerns of feeling off balance when reacting quickly to environment.   PAIN:  Are you having pain? No  PRECAUTIONS: None  WEIGHT BEARING RESTRICTIONS: No  FALLS: Has patient fallen in last 6 months? No  LIVING ENVIRONMENT: Lives with: lives alone and daughter lives next door. Lives in: House/apartment Stairs: Yes: External: 4 steps; on right going up and on  left going up Has following equipment at home:  None, but has use cane in the past. Has shower rails, shower chair; doesn't use  PLOF: Independent with basic ADLs, Independent with household mobility without device, Independent with gait, and Independent  with transfers  PATIENT GOALS: Get stronger, be able to stand long enough to cook lunch/ meals  OBJECTIVE:   DIAGNOSTIC FINDINGS:   MRI THORACIC SPINE WITHOUT CONTRAST: IMPRESSION: 1. Scoliosis of the thoracic spine is convex to the left at T3. 2. Exaggerated thoracic kyphosis is stable 3. Mild right foraminal narrowing at T2-3 and T3-4. 4. Mild disc bulging and facet hypertrophy at T6-7, T7-8, T10-11, and T11-12 without significant stenosis. 5. Chronic fatty endplate marrow changes across the endplates T4-5 through T11-12 with the exception of T6-7 and T7-8 which changes are more edematous.  CHEST - 2 VIEW :IMPRESSION: No active cardiopulmonary disease.  COGNITION: Overall cognitive status: Within functional limits for tasks assessed    POSTURE: rounded shoulders, forward head, and increased thoracic kyphosis    LOWER EXTREMITY MMT:    MMT Right Eval Left Eval  Hip flexion 4- 4  Hip extension    Hip abduction 4 4  Hip adduction 4- 4-  Hip internal rotation    Hip external rotation    Knee flexion 4- 4-  Knee extension 4- 4  Ankle dorsiflexion 4- 4-  Ankle plantarflexion    Ankle inversion    Ankle eversion    (Blank rows = not tested)   GAIT: Gait pattern:  Pt ability to walk in straight line diminished as progressed, primarily drifting to R side,  and decreased stride length Distance walked: 937 ft Assistive device utilized: None Level of assistance: CGA Comments: Pt walking at slow pace, though able to continuously ambulate throughout the 6 mins.  FUNCTIONAL TESTS:  5 times sit to stand: 23.99 sec Timed up and go (TUG): 14.38 sec; 2nd attempt, needed additional cue for correct completion of the  test. 6 minute walk test: 937 ft, see additional comment per Gait assessment above. 10 meter walk test: 11.32 secs; 0.88 m/s Berg Balance Scale: 40, see details below, assessed 2nd session     PATIENT SURVEYS:  FOTO 53  TODAY'S TREATMENT:                                                                                                                              DATE: 07/23/22   NMR: TE  Octane or nustep for aerobic priming x 5 min L 2 SPM > 50 throughout with cues  NMR  PWR! Seated step for hip flexion, abd and add strength 2 x 10 ea  and PWR! Up for post chain postural strengthening.  PWR! Step modified to standing with support x 10 to ea side, cues for proper reaching form   TE LAQ resisted 2 x 12 with 3# AW   HS curl resisted 2 x 12 with RTB   STS x 10 reps without UE assist, cues for forward weight shift and posture at end ramve   PATIENT EDUCATION: Education details: Educated on instruction for tests and measures, outcome measures, and POC. Person educated: Patient Education method: Psychiatrist  comprehension: verbalized understanding  HOME EXERCISE PROGRAM: Access Code: [facility] [email or web address]  Date: 07/17/2022 Prepared by: Grier Rocher Exercises - Standing Single Leg  Stance with Counter Support  - 1 x daily - 7 x weekly - 3 sets - 10 reps - 2 hold -  Seated Hip Abduction with Resistance  - 1 x daily - 7 x weekly - 3 sets - 10 reps -  Seated Long Arc Quad  - 1 x daily - 7 x weekly - 3 sets - 10 reps - 3 hold    GOALS: Goals reviewed with patient? No  SHORT TERM GOALS: Target date: 08/12/2022    Patient will be independent in home exercise program to improve strength/mobility for better functional independence with ADLs. Baseline: No HEP currently  Goal status: INITIAL  LONG TERM GOALS: Target date: 10/07/2022   1.  Patient (> 60 years old) will complete five times sit to stand test in < 15 seconds  indicating an increased LE strength and improved balance. Baseline: 23.99 sec Goal status: INITIAL  2.  Patient will increase FOTO score to equal to or greater than  59  to demonstrate statistically significant improvement in mobility and quality of life.  Baseline: 53 Goal status: INITIAL   3.  Patient will increase Berg Balance score by > 6 points to demonstrate decreased fall risk during functional activities. Baseline: Assessed 2nd session: 40 Goal status: INITIAL   4.   Patient will reduce timed up and go to <11 seconds to reduce fall risk and demonstrate improved transfer/gait ability. Baseline: 14.38 Goal status: INITIAL  5.   Patient will increase 10 meter walk test to >1.57m/s as to improve gait speed for better community ambulation and to reduce fall risk. Baseline: 0.88 m/s Goal status: INITIAL  6.   Patient will increase six minute walk test distance to >1000 for progression to community ambulator and improve gait ability Baseline: 937 feet Goal status: INITIAL     ASSESSMENT:  CLINICAL IMPRESSION:  Patient appeared motivated and ready for treatment on this day. Pt challenged with specific strengthening interventions this date in addition to PD specific PWR! Moves. PWR! Moves target bradykinesia, rigidity, and dyskinesia through targeted functional movements that address four core movement difficulties for people with Parkinson's disease. Pt had fatigue with standing activities requiring seated rest breaks to prevent excessive fatigue. Pt will continue to benefit from skilled physical therapy intervention to address impairments, improve QOL, and attain therapy goals.   OBJECTIVE IMPAIRMENTS: decreased balance, decreased endurance, decreased strength, and Abnormal Gait; R side drift .   ACTIVITY LIMITATIONS: bending, standing, and locomotion level  PARTICIPATION LIMITATIONS: meal prep, cleaning, and community activity  PERSONAL FACTORS: Age, Fitness, and  Past/current experiences are also affecting patient's functional outcome.   REHAB POTENTIAL: Good  CLINICAL DECISION MAKING: Stable/uncomplicated  EVALUATION COMPLEXITY: Low  PLAN:  PT FREQUENCY: 1-2x/week  PT DURATION: 12 weeks  PLANNED INTERVENTIONS: Therapeutic exercises, Therapeutic activity, Neuromuscular re-education, Balance training, Gait training, Patient/Family education, Self Care, Joint mobilization, Vestibular training, Cryotherapy, Moist heat, and Re-evaluation  PLAN FOR NEXT SESSION:    Norman Herrlich, PT 07/23/2022, 1:41 PM    1:41 PM 07/23/22

## 2022-07-29 ENCOUNTER — Ambulatory Visit: Payer: Medicare Other | Admitting: Physical Therapy

## 2022-07-29 DIAGNOSIS — G20A1 Parkinson's disease without dyskinesia, without mention of fluctuations: Secondary | ICD-10-CM

## 2022-07-29 DIAGNOSIS — R2689 Other abnormalities of gait and mobility: Secondary | ICD-10-CM

## 2022-07-29 DIAGNOSIS — M6281 Muscle weakness (generalized): Secondary | ICD-10-CM

## 2022-07-29 DIAGNOSIS — R2681 Unsteadiness on feet: Secondary | ICD-10-CM

## 2022-07-29 NOTE — Therapy (Signed)
OUTPATIENT PHYSICAL THERAPY NEURO TREATMENT NOTE   Patient Name: Katelyn Crawford MRN: [id number] DOB:1940-05-16, 82 y.o., female Today's Date: 07/29/2022   PCP:   Marguarite Arbour, MD   REFERRING PROVIDER: Lonell Face, MD   END OF SESSION:  PT End of Session - 07/29/22 0933     Visit Number 4    Number of Visits 24    Date for PT Re-Evaluation 10/07/22    Progress Note Due on Visit 10    PT Start Time 0933    PT Stop Time 1015    PT Time Calculation (min) 42 min    Equipment Utilized During Treatment Gait belt    Activity Tolerance Patient tolerated treatment well    Behavior During Therapy WFL for tasks assessed/performed                Past Medical History:  Diagnosis Date   Anemia    Aortic atherosclerosis (HCC)    Arthritis    Cardiomegaly    Cardiomyopathy (HCC)    Cholelithiasis    Chronic kidney disease, stage 2 (mild)    Chronic sinus bradycardia    Coronary artery disease 03/10/2012   a.) LHC 03/10/2012: 40% mLAD, 50% pRI, 50% pRCA, 90% mRCA, 50% dRCA, 50% RPDA - unable to cannulate RCA - med mgmt; b.) LHC/PCI 06/25/2013: 30% pLAD, 40% mLAD, 20% mLCx, 30% RI, 30% pRCA, 70% mRCA (2.5 mm Promus DES), 40% dRCA-1, 90% dRCA-2 (2.5 mm Promus DES)   DDD (degenerative disc disease), thoracolumbar    Diverticulosis    Dizziness    Environmental allergies    Full dentures    GERD (gastroesophageal reflux disease) 06/25/2013   H/O bilateral cataract extraction 2019   History of diverticulosis    History of kidney stones    HLD (hyperlipidemia)    Hyperlipidemia, unspecified    Hypertension    Nephrolithiasis    Osteoporosis, post-menopausal    Parkinson disease    Pre-diabetes    PVC's (premature ventricular contractions)    Scoliosis    Thoracic kyphosis    Trifascicular bundle branch block    a.) 1st degree AVB + RBBB + LAFB   Vascular disease    Past Surgical History:  Procedure Laterality Date   APPENDECTOMY     BREAST EXCISIONAL BIOPSY Left  1984   neg   BREAST EXCISIONAL BIOPSY Right 1997   lipoma   CATARACT EXTRACTION W/PHACO Left 09/02/2017   Procedure: CATARACT EXTRACTION PHACO AND INTRAOCULAR LENS PLACEMENT (IOC)  LEFT;  Surgeon: Lockie Mola, MD;  Location[facility];  Service: Ophthalmology;  Laterality: Left;   CATARACT EXTRACTION W/PHACO Right 10/01/2017   Procedure: CATARACT EXTRACTION PHACO AND INTRAOCULAR LENS PLACEMENT (IOC) RIGHT;  Surgeon: Lockie Mola, MD;  Location: [facility];  Service: Ophthalmology;  Laterality: Right;   COLONOSCOPY WITH PROPOFOL N/A 11/27/2016   Procedure: COLONOSCOPY WITH PROPOFOL;  Surgeon: Toledo, Boykin Nearing, MD;  Location: [facility];  Service: Gastroenterology;  Laterality: N/A;   CORONARY ANGIOPLASTY WITH STENT PLACEMENT Left 06/25/2013   Procedure: CORONARY ANGIOPLASTY WITH STENT PLACEMENT; Location: [facility]; Surgeon: Rolly Salter, MD   KNEE ARTHROPLASTY Left 01/22/2017   Procedure: COMPUTER ASSISTED TOTAL KNEE ARTHROPLASTY;  Surgeon: Donato Heinz, MD;  Location: [facility];  Service: Orthopedics;  Laterality: Left;   KNEE ARTHROSCOPY Left 02/01/2015   Procedure: ARTHROSCOPY KNEE, partial medial & lateral menisectomy,,medial and patelofemoral chondroplasty;  Surgeon: Donato Heinz, MD;  Location: [facility];  Service: Orthopedics;  Laterality:  Left;   LEFT HEART CATH AND CORONARY ANGIOGRAPHY Left 03/10/2012   Procedure: LEFT HEART CATH AND CORONARY ANGIOGRAPHY; Location: [facility]; Surgeon: Marcina Millard, MD   LITHOTRIPSY Right 1995   RIGHT AXILLARY LIPOMA REMOVAL     TUBAL LIGATION     VARICOSE VEIN SURGERY     Patient Active Problem List   Diagnosis Date Noted   Primary osteoarthritis of hip 02/09/2017   Primary osteoarthritis of knee 02/09/2017   Chronic sinus bradycardia 01/22/2017   Degenerative disc disease, lumbar 01/22/2017   Dizziness 01/22/2017   Nephrolithiasis 01/22/2017   Osteoporosis, 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/22/2017   PVC's (premature  ventricular contractions) 01/22/2017   S/P total knee arthroplasty 01/22/2017   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   CKD (chronic kidney disease), 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   GERD (gastroesophageal reflux disease) 06/25/2013   Coronary artery disease involving native coronary artery of native heart 06/24/2013   Essential hypertension 06/21/2013   Mixed hyperlipidemia 06/21/2013    ONSET DATE: March 2019  REFERRING DIAG: Parkinson's Disease  THERAPY DIAG:  Parkinson's disease, unspecified whether dyskinesia present, unspecified whether manifestations fluctuate  Unsteadiness on feet  Muscle weakness (generalized)  Other abnormalities of gait and mobility  Rationale for Evaluation and Treatment: Rehabilitation  SUBJECTIVE:                                                                                                                                                                                             SUBJECTIVE STATEMENT: Pt reports doing well since last session, notes no falls/stumbles; no significant changes either.  Pt accompanied by: self  PERTINENT HISTORY: Patient reporting to physical therapy for general unsteadiness and weakness as it relates to Parkinson's disease. Has been seen in the past for PT in 2019. PMH includes: Parkinson's disease, Vascular disease, Dizziness, DDD, GERD, arthritis, CKD, CAD. Reports no falls in the past 6 months. Is currently medicated for Parkinson's; taking medication 4x a day. She notices her biggest struggle is doing activities for longer durations of time. She also has concerns of feeling off balance when reacting quickly to environment.   PAIN:  Are you having pain? No  PRECAUTIONS: None  WEIGHT BEARING RESTRICTIONS: No  FALLS: Has patient fallen in last 6 months? No  LIVING ENVIRONMENT: Lives with: lives alone and daughter lives next door. Lives in: House/apartment Stairs: Yes: External: 4 steps; on right going up and on  left going up Has following equipment at home:  None, but has use cane in the past. Has shower rails, shower chair; doesn't use  PLOF: Independent with basic ADLs, Independent with household mobility without device, Independent with  gait, and Independent with transfers  PATIENT GOALS: Get stronger, be able to stand long enough to cook lunch/ meals  OBJECTIVE:   DIAGNOSTIC FINDINGS:   MRI THORACIC SPINE WITHOUT CONTRAST: IMPRESSION: 1. Scoliosis of the thoracic spine is convex to the left at T3. 2. Exaggerated thoracic kyphosis is stable 3. Mild right foraminal narrowing at T2-3 and T3-4. 4. Mild disc bulging and facet hypertrophy at T6-7, T7-8, T10-11, and T11-12 without significant stenosis. 5. Chronic fatty endplate marrow changes across the endplates T4-5 through T11-12 with the exception of T6-7 and T7-8 which changes are more edematous.  CHEST - 2 VIEW :IMPRESSION: No active cardiopulmonary disease.  COGNITION: Overall cognitive status: Within functional limits for tasks assessed    POSTURE: rounded shoulders, forward head, and increased thoracic kyphosis    LOWER EXTREMITY MMT:    MMT Right Eval Left Eval  Hip flexion 4- 4  Hip extension    Hip abduction 4 4  Hip adduction 4- 4-  Hip internal rotation    Hip external rotation    Knee flexion 4- 4-  Knee extension 4- 4  Ankle dorsiflexion 4- 4-  Ankle plantarflexion    Ankle inversion    Ankle eversion    (Blank rows = not tested)   GAIT: Gait pattern:  Pt ability to walk in straight line diminished as progressed, primarily drifting to R side,  and decreased stride length Distance walked: 937 ft Assistive device utilized: None Level of assistance: CGA Comments: Pt walking at slow pace, though able to continuously ambulate throughout the 6 mins.  FUNCTIONAL TESTS:  5 times sit to stand: 23.99 sec Timed up and go (TUG): 14.38 sec; 2nd attempt, needed additional cue for correct completion of the  test. 6 minute walk test: 937 ft, see additional comment per Gait assessment above. 10 meter walk test: 11.32 secs; 0.88 m/s Berg Balance Scale: 40, see details below, assessed 2nd session     PATIENT SURVEYS:  FOTO 53  TODAY'S TREATMENT:                                                                                                                              DATE: 07/29/22   TE:  Gait training no resistance x 348ft. Gait with 3# ankle weights x 49ft. Supervision assist-CGA with cues to improved posture and arm swing intermittently.   Reciprocal foot tap on 6inch step x 15 bil  Lateral foot tap on 6 inch step x 12 bil  Seated LAQ resisted 2 x 12 with 3# AW  Seated HS curl resisted 2 x 12 with RTB  Seated hip flexion x 12 bil  Seated hip abduction x 12 bil  Seated isometric hip adduction with 3 sec hold to squeeze   STS x 8 with ball raise overhead. CGA from PT for adequate anterior weight shift.  Sitting Forward reach to touch feet then open Bil arms in sitting x 10  PATIENT EDUCATION: Education details: Educated on instruction for tests and measures, outcome measures, and POC. Person educated: Patient Education method: Medical illustrator Education comprehension: verbalized understanding  HOME EXERCISE PROGRAM: Access Code: [facility] [email or web address]  Date: 07/17/2022 Prepared by: Grier Rocher Exercises - Standing Single Leg  Stance with Counter Support  - 1 x daily - 7 x weekly - 3 sets - 10 reps - 2 hold -  Seated Hip Abduction with Resistance  - 1 x daily - 7 x weekly - 3 sets - 10 reps -  Seated Long Arc Quad  - 1 x daily - 7 x weekly - 3 sets - 10 reps - 3 hold    GOALS: Goals reviewed with patient? No  SHORT TERM GOALS: Target date: 08/12/2022    Patient will be independent in home exercise program to improve strength/mobility for better functional independence with ADLs. Baseline: No HEP currently  Goal status:  INITIAL  LONG TERM GOALS: Target date: 10/07/2022   1.  Patient (> 54 years old) will complete five times sit to stand test in < 15 seconds indicating an increased LE strength and improved balance. Baseline: 23.99 sec Goal status: INITIAL  2.  Patient will increase FOTO score to equal to or greater than  59  to demonstrate statistically significant improvement in mobility and quality of life.  Baseline: 53 Goal status: INITIAL   3.  Patient will increase Berg Balance score by > 6 points to demonstrate decreased fall risk during functional activities. Baseline: Assessed 2nd session: 40 Goal status: INITIAL   4.   Patient will reduce timed up and go to <11 seconds to reduce fall risk and demonstrate improved transfer/gait ability. Baseline: 14.38 Goal status: INITIAL  5.   Patient will increase 10 meter walk test to >1.46m/s as to improve gait speed for better community ambulation and to reduce fall risk. Baseline: 0.88 m/s Goal status: INITIAL  6.   Patient will increase six minute walk test distance to >1000 for progression to community ambulator and improve gait ability Baseline: 937 feet Goal status: INITIAL     ASSESSMENT:  CLINICAL IMPRESSION:  Patient appeared motivated and ready for treatment on this day. Pt challenged with specific strengthening for BLE weakness, as well as functional gait training with and without resistance to for improved magnitude of movement. Encouraged arm swing and improved erect posture as well as large amplitude movement. Pt will continue to benefit from skilled physical therapy intervention to address impairments, improve QOL, and attain therapy goals.   OBJECTIVE IMPAIRMENTS: decreased balance, decreased endurance, decreased strength, and Abnormal Gait; R side drift .   ACTIVITY LIMITATIONS: bending, standing, and locomotion level  PARTICIPATION LIMITATIONS: meal prep, cleaning, and community activity  PERSONAL FACTORS: Age, Fitness, and  Past/current experiences are also affecting patient's functional outcome.   REHAB POTENTIAL: Good  CLINICAL DECISION MAKING: Stable/uncomplicated  EVALUATION COMPLEXITY: Low  PLAN:  PT FREQUENCY: 1-2x/week  PT DURATION: 12 weeks  PLANNED INTERVENTIONS: Therapeutic exercises, Therapeutic activity, Neuromuscular re-education, Balance training, Gait training, Patient/Family education, Self Care, Joint mobilization, Vestibular training, Cryotherapy, Moist heat, and Re-evaluation  PLAN FOR NEXT SESSION:   Weighted gait. BLE strengthening. Endurance.    Golden Pop, PT 07/29/2022, 1:54 PM    1:54 PM 07/29/22

## 2022-08-01 ENCOUNTER — Ambulatory Visit: Payer: Medicare Other | Admitting: Physical Therapy

## 2022-08-01 DIAGNOSIS — G20A1 Parkinson's disease without dyskinesia, without mention of fluctuations: Secondary | ICD-10-CM

## 2022-08-01 DIAGNOSIS — M6281 Muscle weakness (generalized): Secondary | ICD-10-CM

## 2022-08-01 DIAGNOSIS — R2681 Unsteadiness on feet: Secondary | ICD-10-CM

## 2022-08-01 DIAGNOSIS — R471 Dysarthria and anarthria: Secondary | ICD-10-CM

## 2022-08-01 DIAGNOSIS — R2689 Other abnormalities of gait and mobility: Secondary | ICD-10-CM

## 2022-08-01 NOTE — Therapy (Signed)
OUTPATIENT PHYSICAL THERAPY NEURO TREATMENT NOTE   Patient Name: Katelyn Crawford MRN: [id number] DOB:[birth year], 82 y.o., female Today's Date: 08/01/2022   PCP:   Marguarite Arbour, MD   REFERRING PROVIDER: Lonell Face, MD   END OF SESSION:  PT End of Session - 08/01/22 0754     Visit Number 5    Number of Visits 24    Date for PT Re-Evaluation 10/07/22    Progress Note Due on Visit 10    PT Start Time 0800    PT Stop Time 0844    PT Time Calculation (min) 44 min    Equipment Utilized During Treatment Gait belt    Activity Tolerance Patient tolerated treatment well    Behavior During Therapy WFL for tasks assessed/performed                Past Medical History:  Diagnosis Date   Anemia    Aortic atherosclerosis (HCC)    Arthritis    Cardiomegaly    Cardiomyopathy (HCC)    Cholelithiasis    Chronic kidney disease, stage 2 (mild)    Chronic sinus bradycardia    Coronary artery disease 03/10/2012   a.) LHC 03/10/2012: 40% mLAD, 50% pRI, 50% pRCA, 90% mRCA, 50% dRCA, 50% RPDA - unable to cannulate RCA - med mgmt; b.) LHC/PCI 06/25/2013: 30% pLAD, 40% mLAD, 20% mLCx, 30% RI, 30% pRCA, 70% mRCA (2.5 mm Promus DES), 40% dRCA-1, 90% dRCA-2 (2.5 mm Promus DES)   DDD (degenerative disc disease), thoracolumbar    Diverticulosis    Dizziness    Environmental allergies    Full dentures    GERD (gastroesophageal reflux disease) 06/25/2013   H/O bilateral cataract extraction 2019   History of diverticulosis    History of kidney stones    HLD (hyperlipidemia)    Hyperlipidemia, unspecified    Hypertension    Nephrolithiasis    Osteoporosis, post-menopausal    Parkinson disease    Pre-diabetes    PVC's (premature ventricular contractions)    Scoliosis    Thoracic kyphosis    Trifascicular bundle branch block    a.) 1st degree AVB + RBBB + LAFB   Vascular disease    Past Surgical History:  Procedure Laterality Date   APPENDECTOMY     BREAST EXCISIONAL BIOPSY  Left 1984   neg   BREAST EXCISIONAL BIOPSY Right 1997   lipoma   CATARACT EXTRACTION W/PHACO Left 09/02/2017   Procedure: CATARACT EXTRACTION PHACO AND INTRAOCULAR LENS PLACEMENT (IOC)  LEFT;  Surgeon: Lockie Mola, MD;  Location: [facility];  Service: Ophthalmology;  Laterality: Left;   CATARACT EXTRACTION W/PHACO Right 10/01/2017   Procedure: CATARACT EXTRACTION PHACO AND INTRAOCULAR LENS PLACEMENT (IOC) RIGHT;  Surgeon: Lockie Mola, MD;  Location: [facility];  Service: Ophthalmology;  Laterality: Right;   COLONOSCOPY WITH PROPOFOL N/A 11/27/2016   Procedure: COLONOSCOPY WITH PROPOFOL;  Surgeon: Toledo, Boykin Nearing, MD;  Location: [facility];  Service: Gastroenterology;  Laterality: N/A;   CORONARY ANGIOPLASTY WITH STENT PLACEMENT Left 06/25/2013   Procedure: CORONARY ANGIOPLASTY WITH STENT PLACEMENT; Location: [facility]; Surgeon: Rolly Salter, MD   KNEE ARTHROPLASTY Left 01/22/2017   Procedure: COMPUTER ASSISTED TOTAL KNEE ARTHROPLASTY;  Surgeon: Donato Heinz, MD;  Location: [facility];  Service: Orthopedics;  Laterality: Left;   KNEE ARTHROSCOPY Left 02/01/2015   Procedure: ARTHROSCOPY KNEE, partial medial & lateral menisectomy,,medial and patelofemoral chondroplasty;  Surgeon: Donato Heinz, MD;  Location: [facility];  Service: Orthopedics;  Laterality:  Left;   LEFT HEART CATH AND CORONARY ANGIOGRAPHY Left 03/10/2012   Procedure: LEFT HEART CATH AND CORONARY ANGIOGRAPHY; Location: [facility]; Surgeon: Marcina Millard, MD   LITHOTRIPSY Right 1995   RIGHT AXILLARY LIPOMA REMOVAL     TUBAL LIGATION     VARICOSE VEIN SURGERY     Patient Active Problem List   Diagnosis Date Noted   Primary osteoarthritis of hip 02/09/2017   Primary osteoarthritis of knee 02/09/2017   Chronic sinus bradycardia 01/22/2017   Degenerative disc disease, lumbar 01/22/2017   Dizziness 01/22/2017   Nephrolithiasis 01/22/2017   Osteoporosis, 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/22/2017   PVC's  (premature ventricular contractions) 01/22/2017   S/P total knee arthroplasty 01/22/2017   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   CKD (chronic kidney disease), 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   GERD (gastroesophageal reflux disease) 06/25/2013   Coronary artery disease involving native coronary artery of native heart 06/24/2013   Essential hypertension 06/21/2013   Mixed hyperlipidemia 06/21/2013    ONSET DATE: March 2019  REFERRING DIAG: Parkinson's Disease  THERAPY DIAG:  Parkinson's disease, unspecified whether dyskinesia present, unspecified whether manifestations fluctuate  Unsteadiness on feet  Muscle weakness (generalized)  Other abnormalities of gait and mobility  Dysarthria and anarthria  Rationale for Evaluation and Treatment: Rehabilitation  SUBJECTIVE:                                                                                                                                                                                             SUBJECTIVE STATEMENT: Pt reports doing well since last session. No medical updates on this day.  PT provided education for [facility] PD exercise class, pt will think about it for a few weeks, as the class does not start until August.  Pt accompanied by: self  PERTINENT HISTORY: Patient reporting to physical therapy for general unsteadiness and weakness as it relates to Parkinson's disease. Has been seen in the past for PT in 2019. PMH includes: Parkinson's disease, Vascular disease, Dizziness, DDD, GERD, arthritis, CKD, CAD. Reports no falls in the past 6 months. Is currently medicated for Parkinson's; taking medication 4x a day. She notices her biggest struggle is doing activities for longer durations of time. She also has concerns of feeling off balance when reacting quickly to environment.   PAIN:  Are you having pain? No  PRECAUTIONS: None  WEIGHT BEARING RESTRICTIONS: No  FALLS: Has patient fallen in last 6 months? No  LIVING  ENVIRONMENT: Lives with: lives alone and daughter lives next door. Lives in: House/apartment Stairs: Yes: External: 4 steps; on right going up and on left going up Has following equipment at home:  None, but has use cane in the past. Has shower rails, shower chair; doesn't use  PLOF: Independent with basic ADLs, Independent with household mobility without device, Independent with gait, and Independent with transfers  PATIENT GOALS: Get stronger, be able to stand long enough to cook lunch/ meals  OBJECTIVE:   DIAGNOSTIC FINDINGS:   MRI THORACIC SPINE WITHOUT CONTRAST: IMPRESSION: 1. Scoliosis of the thoracic spine is convex to the left at T3. 2. Exaggerated thoracic kyphosis is stable 3. Mild right foraminal narrowing at T2-3 and T3-4. 4. Mild disc bulging and facet hypertrophy at T6-7, T7-8, T10-11, and T11-12 without significant stenosis. 5. Chronic fatty endplate marrow changes across the endplates T4-5 through T11-12 with the exception of T6-7 and T7-8 which changes are more edematous.  CHEST - 2 VIEW :IMPRESSION: No active cardiopulmonary disease.  COGNITION: Overall cognitive status: Within functional limits for tasks assessed    POSTURE: rounded shoulders, forward head, and increased thoracic kyphosis    LOWER EXTREMITY MMT:    MMT Right Eval Left Eval  Hip flexion 4- 4  Hip extension    Hip abduction 4 4  Hip adduction 4- 4-  Hip internal rotation    Hip external rotation    Knee flexion 4- 4-  Knee extension 4- 4  Ankle dorsiflexion 4- 4-  Ankle plantarflexion    Ankle inversion    Ankle eversion    (Blank rows = not tested)   GAIT: Gait pattern:  Pt ability to walk in straight line diminished as progressed, primarily drifting to R side,  and decreased stride length Distance walked: 937 ft Assistive device utilized: None Level of assistance: CGA Comments: Pt walking at slow pace, though able to continuously ambulate throughout the 6  mins.  FUNCTIONAL TESTS:  5 times sit to stand: 23.99 sec Timed up and go (TUG): 14.38 sec; 2nd attempt, needed additional cue for correct completion of the test. 6 minute walk test: 937 ft, see additional comment per Gait assessment above. 10 meter walk test: 11.32 secs; 0.88 m/s Berg Balance Scale: 40, see details below, assessed 2nd session     PATIENT SURVEYS:  FOTO 53  TODAY'S TREATMENT:                                                                                                                              DATE: 08/01/22   TE:  NUstep reciprocal movement training with emphasis on full ROM level 1 x 1 min  level 3 x 3 min  Level 4 x 1 min  Level 1 x 1 min   Stepping over half bolster forward x 10 bil  UE support on parallel bars  Side stepping over half bolster x 10 bil UE support on parallel bars  Forward stepping over cane on floor x 10 bil with no UE support  Side stepping over cane x 10 bil with no UE support   Standing Hip abduction 3#ankle weight  Standing Hip extension 3# ankle weights  Side stepping R and L with 3 # ankle weights 23ft x 2  Standing hip flexion x 15 bil with cues for increased speed and ROM.   Weighted gait training with 3# ankle weights 164ft x 2 + 65ft with supervision assist and cues for increased step length and heel contact to prevent foot drag  PT provided supervision-CGA throughout session for improved safety and weight shift over the RLE to allow increased step length on the LLE.    PATIENT EDUCATION: Education details: Educated on instruction for tests and measures, outcome measures, and POC. Person educated: Patient Education method: Medical illustrator Education comprehension: verbalized understanding  HOME EXERCISE PROGRAM: Access Code: [facility] [email or web address]  Date: 07/17/2022 Prepared by: Grier Rocher Exercises - Standing Single Leg  Stance with Counter Support  - 1 x daily - 7 x weekly  - 3 sets - 10 reps - 2 hold -  Seated Hip Abduction with Resistance  - 1 x daily - 7 x weekly - 3 sets - 10 reps -  Seated Long Arc Quad  - 1 x daily - 7 x weekly - 3 sets - 10 reps - 3 hold    GOALS: Goals reviewed with patient? No  SHORT TERM GOALS: Target date: 08/12/2022    Patient will be independent in home exercise program to improve strength/mobility for better functional independence with ADLs. Baseline: No HEP currently  Goal status: INITIAL  LONG TERM GOALS: Target date: 10/07/2022   1.  Patient (> 15 years old) will complete five times sit to stand test in < 15 seconds indicating an increased LE strength and improved balance. Baseline: 23.99 sec Goal status: INITIAL  2.  Patient will increase FOTO score to equal to or greater than  59  to demonstrate statistically significant improvement in mobility and quality of life.  Baseline: 53 Goal status: INITIAL   3.  Patient will increase Berg Balance score by > 6 points to demonstrate decreased fall risk during functional activities. Baseline: Assessed 2nd session: 40 Goal status: INITIAL   4.   Patient will reduce timed up and go to <11 seconds to reduce fall risk and demonstrate improved transfer/gait ability. Baseline: 14.38 Goal status: INITIAL  5.   Patient will increase 10 meter walk test to >1.37m/s as to improve gait speed for better community ambulation and to reduce fall risk. Baseline: 0.88 m/s Goal status: INITIAL  6.   Patient will increase six minute walk test distance to >1000 for progression to community ambulator and improve gait ability Baseline: 937 feet Goal status: INITIAL     ASSESSMENT:  CLINICAL IMPRESSION:  Patient appeared motivated and ready for treatment on this day. PT treatment focused on increased step length and amplitude of movement with reduced use of UE support as tolerated with dynamic and weighted movements. Pt rates all activity and moderate difficulty throughout session.  Pt  will continue to benefit from skilled physical therapy intervention to address impairments, improve QOL, and attain therapy goals.   OBJECTIVE IMPAIRMENTS: decreased balance, decreased endurance, decreased strength, and Abnormal Gait; R side drift .   ACTIVITY LIMITATIONS: bending, standing, and locomotion level  PARTICIPATION LIMITATIONS: meal prep, cleaning, and community activity  PERSONAL FACTORS: Age, Fitness, and Past/current experiences are also affecting patient's functional outcome.   REHAB POTENTIAL: Good  CLINICAL DECISION MAKING: Stable/uncomplicated  EVALUATION COMPLEXITY: Low  PLAN:  PT FREQUENCY: 1-2x/week  PT DURATION: 12 weeks  PLANNED INTERVENTIONS: Therapeutic exercises,  Therapeutic activity, Neuromuscular re-education, Balance training, Gait training, Patient/Family education, Self Care, Joint mobilization, Vestibular training, Cryotherapy, Moist heat, and Re-evaluation  PLAN FOR NEXT SESSION:    BLE strengthening. Endurance. Dynamic balance training    Golden Pop, PT 08/01/2022, 9:52 AM    9:52 AM 08/01/22

## 2022-08-05 ENCOUNTER — Ambulatory Visit: Payer: Medicare Other | Admitting: Physical Therapy

## 2022-08-05 DIAGNOSIS — R2689 Other abnormalities of gait and mobility: Secondary | ICD-10-CM

## 2022-08-05 DIAGNOSIS — G20A1 Parkinson's disease without dyskinesia, without mention of fluctuations: Secondary | ICD-10-CM

## 2022-08-05 DIAGNOSIS — R2681 Unsteadiness on feet: Secondary | ICD-10-CM

## 2022-08-05 DIAGNOSIS — M6281 Muscle weakness (generalized): Secondary | ICD-10-CM

## 2022-08-05 NOTE — Therapy (Signed)
OUTPATIENT PHYSICAL THERAPY NEURO TREATMENT NOTE   Patient Name: Katelyn Crawford MRN: [id number] DOB:[birth year], 82 y.o., female Today's Date: 08/05/2022   PCP:   Marguarite Arbour, MD   REFERRING PROVIDER: Lonell Face, MD   END OF SESSION:  PT End of Session - 08/05/22 0948     Visit Number 6    Number of Visits 24    Date for PT Re-Evaluation 10/07/22    Progress Note Due on Visit 10    PT Start Time 0934    PT Stop Time 1015    PT Time Calculation (min) 41 min    Equipment Utilized During Treatment Gait belt    Activity Tolerance Patient tolerated treatment well    Behavior During Therapy WFL for tasks assessed/performed                 Past Medical History:  Diagnosis Date   Anemia    Aortic atherosclerosis (HCC)    Arthritis    Cardiomegaly    Cardiomyopathy (HCC)    Cholelithiasis    Chronic kidney disease, stage 2 (mild)    Chronic sinus bradycardia    Coronary artery disease 03/10/2012   a.) LHC 03/10/2012: 40% mLAD, 50% pRI, 50% pRCA, 90% mRCA, 50% dRCA, 50% RPDA - unable to cannulate RCA - med mgmt; b.) LHC/PCI 06/25/2013: 30% pLAD, 40% mLAD, 20% mLCx, 30% RI, 30% pRCA, 70% mRCA (2.5 mm Promus DES), 40% dRCA-1, 90% dRCA-2 (2.5 mm Promus DES)   DDD (degenerative disc disease), thoracolumbar    Diverticulosis    Dizziness    Environmental allergies    Full dentures    GERD (gastroesophageal reflux disease) 06/25/2013   H/O bilateral cataract extraction 2019   History of diverticulosis    History of kidney stones    HLD (hyperlipidemia)    Hyperlipidemia, unspecified    Hypertension    Nephrolithiasis    Osteoporosis, post-menopausal    Parkinson disease    Pre-diabetes    PVC's (premature ventricular contractions)    Scoliosis    Thoracic kyphosis    Trifascicular bundle branch block    a.) 1st degree AVB + RBBB + LAFB   Vascular disease    Past Surgical History:  Procedure Laterality Date   APPENDECTOMY     BREAST EXCISIONAL BIOPSY  Left 1984   neg   BREAST EXCISIONAL BIOPSY Right 1997   lipoma   CATARACT EXTRACTION W/PHACO Left 09/02/2017   Procedure: CATARACT EXTRACTION PHACO AND INTRAOCULAR LENS PLACEMENT (IOC)  LEFT;  Surgeon: Lockie Mola, MD;  Location: [facility];  Service: Ophthalmology;  Laterality: Left;   CATARACT EXTRACTION W/PHACO Right 10/01/2017   Procedure: CATARACT EXTRACTION PHACO AND INTRAOCULAR LENS PLACEMENT (IOC) RIGHT;  Surgeon: Lockie Mola, MD;  Location: [facility];  Service: Ophthalmology;  Laterality: Right;   COLONOSCOPY WITH PROPOFOL N/A 11/27/2016   Procedure: COLONOSCOPY WITH PROPOFOL;  Surgeon: Toledo, Boykin Nearing, MD;  Location: [facility];  Service: Gastroenterology;  Laterality: N/A;   CORONARY ANGIOPLASTY WITH STENT PLACEMENT Left 06/25/2013   Procedure: CORONARY ANGIOPLASTY WITH STENT PLACEMENT; Location: [facility]; Surgeon: Rolly Salter, MD   KNEE ARTHROPLASTY Left 01/22/2017   Procedure: COMPUTER ASSISTED TOTAL KNEE ARTHROPLASTY;  Surgeon: Donato Heinz, MD;  Location: [facility];  Service: Orthopedics;  Laterality: Left;   KNEE ARTHROSCOPY Left 02/01/2015   Procedure: ARTHROSCOPY KNEE, partial medial & lateral menisectomy,,medial and patelofemoral chondroplasty;  Surgeon: Donato Heinz, MD;  Location: [facility];  Service: Orthopedics;  Laterality: Left;   LEFT HEART CATH AND CORONARY ANGIOGRAPHY Left 03/10/2012   Procedure: LEFT HEART CATH AND CORONARY ANGIOGRAPHY; Location: [facility]; Surgeon: Marcina Millard, MD   LITHOTRIPSY Right 1995   RIGHT AXILLARY LIPOMA REMOVAL     TUBAL LIGATION     VARICOSE VEIN SURGERY     Patient Active Problem List   Diagnosis Date Noted   Primary osteoarthritis of hip 02/09/2017   Primary osteoarthritis of knee 02/09/2017   Chronic sinus bradycardia 01/22/2017   Degenerative disc disease, lumbar 01/22/2017   Dizziness 01/22/2017   Nephrolithiasis 01/22/2017   Osteoporosis, 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/22/2017   PVC's  (premature ventricular contractions) 01/22/2017   S/P total knee arthroplasty 01/22/2017   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   CKD (chronic kidney disease), 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   GERD (gastroesophageal reflux disease) 06/25/2013   Coronary artery disease involving native coronary artery of native heart 06/24/2013   Essential hypertension 06/21/2013   Mixed hyperlipidemia 06/21/2013    ONSET DATE: March 2019  REFERRING DIAG: Parkinson's Disease  THERAPY DIAG:  Parkinson's disease, unspecified whether dyskinesia present, unspecified whether manifestations fluctuate  Unsteadiness on feet  Muscle weakness (generalized)  Other abnormalities of gait and mobility  Rationale for Evaluation and Treatment: Rehabilitation  SUBJECTIVE:                                                                                                                                                                                             SUBJECTIVE STATEMENT: Pt reports that she is hoving a good morning. No new updates since last PT session  Pt accompanied by: self  PERTINENT HISTORY: Patient reporting to physical therapy for general unsteadiness and weakness as it relates to Parkinson's disease. Has been seen in the past for PT in 2019. PMH includes: Parkinson's disease, Vascular disease, Dizziness, DDD, GERD, arthritis, CKD, CAD. Reports no falls in the past 6 months. Is currently medicated for Parkinson's; taking medication 4x a day. She notices her biggest struggle is doing activities for longer durations of time. She also has concerns of feeling off balance when reacting quickly to environment.   PAIN:  Are you having pain? No  PRECAUTIONS: None  WEIGHT BEARING RESTRICTIONS: No  FALLS: Has patient fallen in last 6 months? No  LIVING ENVIRONMENT: Lives with: lives alone and daughter lives next door. Lives in: House/apartment Stairs: Yes: External: 4 steps; on right going up and on left  going up Has following equipment at home:  None, but has use cane in the past. Has shower rails, shower chair; doesn't use  PLOF: Independent with basic ADLs, Independent with household mobility without  device, Independent with gait, and Independent with transfers  PATIENT GOALS: Get stronger, be able to stand long enough to cook lunch/ meals  OBJECTIVE:   DIAGNOSTIC FINDINGS:   MRI THORACIC SPINE WITHOUT CONTRAST: IMPRESSION: 1. Scoliosis of the thoracic spine is convex to the left at T3. 2. Exaggerated thoracic kyphosis is stable 3. Mild right foraminal narrowing at T2-3 and T3-4. 4. Mild disc bulging and facet hypertrophy at T6-7, T7-8, T10-11, and T11-12 without significant stenosis. 5. Chronic fatty endplate marrow changes across the endplates T4-5 through T11-12 with the exception of T6-7 and T7-8 which changes are more edematous.  CHEST - 2 VIEW :IMPRESSION: No active cardiopulmonary disease.  COGNITION: Overall cognitive status: Within functional limits for tasks assessed    POSTURE: rounded shoulders, forward head, and increased thoracic kyphosis    LOWER EXTREMITY MMT:    MMT Right Eval Left Eval  Hip flexion 4- 4  Hip extension    Hip abduction 4 4  Hip adduction 4- 4-  Hip internal rotation    Hip external rotation    Knee flexion 4- 4-  Knee extension 4- 4  Ankle dorsiflexion 4- 4-  Ankle plantarflexion    Ankle inversion    Ankle eversion    (Blank rows = not tested)   GAIT: Gait pattern:  Pt ability to walk in straight line diminished as progressed, primarily drifting to R side,  and decreased stride length Distance walked: 937 ft Assistive device utilized: None Level of assistance: CGA Comments: Pt walking at slow pace, though able to continuously ambulate throughout the 6 mins.  FUNCTIONAL TESTS:  5 times sit to stand: 23.99 sec Timed up and go (TUG): 14.38 sec; 2nd attempt, needed additional cue for correct completion of the test. 6  minute walk test: 937 ft, see additional comment per Gait assessment above. 10 meter walk test: 11.32 secs; 0.88 m/s Berg Balance Scale: 40, see details below, assessed 2nd session     PATIENT SURVEYS:  FOTO 53  TODAY'S TREATMENT:                                                                                                                              DATE: 08/05/22   TE:  NUstep reciprocal movement training with emphasis on full ROM level 2 x 2 min  level 3 x 2 min  Level 2 x 1 min   Dynamic gait in parallel bars with RTB: UE support throughout  Side stepping R and L x 3 each  Forward/reverse x 3 each   Standing hip abduction RTB x 10 bil  Hip extension x 10 bil RTB  Sit<>stand without UE support 3 x 5reps, cues for increased speed,  anterior weight shift, and full hip extension in transfers  Obstacle course navigation to step up/down on 3 airex pads, up/down 4inch step, over 2 canes and over 1 bolster. Performed 4 bout, 2 CW and 2CCW. CGA from PT with  cues for step height on the RLE due to toe drag when stepping up with LLE first.   Standing on wedge UE support x 30 sec, no UE support x 30 sec. Calf raise x 12 with UE supported on rails.   Throughout session, PT provided supervision assist-CGA for safety with min cues for step width and height as well as awareness of lateral R lean when fatigued  PATIENT EDUCATION: Education details: Educated on instruction for tests and measures, outcome measures, and POC. Pt educated throughout session about proper posture and technique with exercises. Improved exercise technique, movement at target joints, use of target muscles after min to mod verbal, visual, tactile cues.  Person educated: Patient Education method: Medical illustrator Education comprehension: verbalized understanding  HOME EXERCISE PROGRAM: Access Code: [facility] [email or web address]  Date: 07/17/2022 Prepared by: Grier Rocher  Exercises - Standing Single Leg  Stance with Counter Support  - 1 x daily - 7 x weekly - 3 sets - 10 reps - 2 hold -  Seated Hip Abduction with Resistance  - 1 x daily - 7 x weekly - 3 sets - 10 reps -  Seated Long Arc Quad  - 1 x daily - 7 x weekly - 3 sets - 10 reps - 3 hold    GOALS: Goals reviewed with patient? No  SHORT TERM GOALS: Target date: 08/12/2022    Patient will be independent in home exercise program to improve strength/mobility for better functional independence with ADLs. Baseline: No HEP currently  Goal status: INITIAL  LONG TERM GOALS: Target date: 10/07/2022   1.  Patient (> 26 years old) will complete five times sit to stand test in < 15 seconds indicating an increased LE strength and improved balance. Baseline: 23.99 sec Goal status: INITIAL  2.  Patient will increase FOTO score to equal to or greater than  59  to demonstrate statistically significant improvement in mobility and quality of life.  Baseline: 53 Goal status: INITIAL   3.  Patient will increase Berg Balance score by > 6 points to demonstrate decreased fall risk during functional activities. Baseline: Assessed 2nd session: 40 Goal status: INITIAL   4.   Patient will reduce timed up and go to <11 seconds to reduce fall risk and demonstrate improved transfer/gait ability. Baseline: 14.38 Goal status: INITIAL  5.   Patient will increase 10 meter walk test to >1.23m/s as to improve gait speed for better community ambulation and to reduce fall risk. Baseline: 0.88 m/s Goal status: INITIAL  6.   Patient will increase six minute walk test distance to >1000 for progression to community ambulator and improve gait ability Baseline: 937 feet Goal status: INITIAL     ASSESSMENT:  CLINICAL IMPRESSION:  Patient appeared motivated and ready for treatment on this day. PT treatment focused on dynamic balance and functional gait/strengthening. Pt demonstrating mild R bias with fatigue with increased R  hand tremor noted while completing novel balance tasks. Instruction from PT for full ROm to improved activation with target muscles in standing therex: Pt will continue to benefit from skilled physical therapy intervention to address impairments, improve QOL, and attain therapy goals.   OBJECTIVE IMPAIRMENTS: decreased balance, decreased endurance, decreased strength, and Abnormal Gait; R side drift .   ACTIVITY LIMITATIONS: bending, standing, and locomotion level  PARTICIPATION LIMITATIONS: meal prep, cleaning, and community activity  PERSONAL FACTORS: Age, Fitness, and Past/current experiences are also affecting patient's functional outcome.   REHAB POTENTIAL: Good  CLINICAL DECISION MAKING: Stable/uncomplicated  EVALUATION COMPLEXITY: Low  PLAN:  PT FREQUENCY: 1-2x/week  PT DURATION: 12 weeks  PLANNED INTERVENTIONS: Therapeutic exercises, Therapeutic activity, Neuromuscular re-education, Balance training, Gait training, Patient/Family education, Self Care, Joint mobilization, Vestibular training, Cryotherapy, Moist heat, and Re-evaluation  PLAN FOR NEXT SESSION:    BLE strengthening. Endurance. Dynamic balance training  Re-assess HEP   Golden Pop, PT 08/05/2022, 11:23 AM    11:23 AM 08/05/22

## 2022-08-06 ENCOUNTER — Encounter: Payer: Medicare Other | Admitting: Speech Pathology

## 2022-08-06 ENCOUNTER — Ambulatory Visit: Payer: Medicare Other | Admitting: Physical Therapy

## 2022-08-07 ENCOUNTER — Ambulatory Visit: Payer: Medicare Other | Admitting: Physical Therapy

## 2022-08-07 DIAGNOSIS — R471 Dysarthria and anarthria: Secondary | ICD-10-CM

## 2022-08-07 DIAGNOSIS — R2689 Other abnormalities of gait and mobility: Secondary | ICD-10-CM

## 2022-08-07 DIAGNOSIS — G20A1 Parkinson's disease without dyskinesia, without mention of fluctuations: Secondary | ICD-10-CM

## 2022-08-07 DIAGNOSIS — R2681 Unsteadiness on feet: Secondary | ICD-10-CM

## 2022-08-07 DIAGNOSIS — M6281 Muscle weakness (generalized): Secondary | ICD-10-CM

## 2022-08-07 NOTE — Therapy (Signed)
OUTPATIENT PHYSICAL THERAPY NEURO TREATMENT NOTE   Patient Name: Katelyn Crawford MRN: [id number] DOB:04/08/1940, 82 y.o., female Today's Date: 08/07/2022   PCP:   Marguarite Arbour, MD   REFERRING PROVIDER: Lonell Face, MD   END OF SESSION:  PT End of Session - 08/07/22 1014     Visit Number 7    Number of Visits 24    Date for PT Re-Evaluation 10/07/22    Progress Note Due on Visit 10    PT Start Time 1015    PT Stop Time 1055    PT Time Calculation (min) 40 min    Equipment Utilized During Treatment Gait belt    Activity Tolerance Patient tolerated treatment well    Behavior During Therapy WFL for tasks assessed/performed                 Past Medical History:  Diagnosis Date   Anemia    Aortic atherosclerosis (HCC)    Arthritis    Cardiomegaly    Cardiomyopathy (HCC)    Cholelithiasis    Chronic kidney disease, stage 2 (mild)    Chronic sinus bradycardia    Coronary artery disease 03/10/2012   a.) LHC 03/10/2012: 40% mLAD, 50% pRI, 50% pRCA, 90% mRCA, 50% dRCA, 50% RPDA - unable to cannulate RCA - med mgmt; b.) LHC/PCI 06/25/2013: 30% pLAD, 40% mLAD, 20% mLCx, 30% RI, 30% pRCA, 70% mRCA (2.5 mm Promus DES), 40% dRCA-1, 90% dRCA-2 (2.5 mm Promus DES)   DDD (degenerative disc disease), thoracolumbar    Diverticulosis    Dizziness    Environmental allergies    Full dentures    GERD (gastroesophageal reflux disease) 06/25/2013   H/O bilateral cataract extraction 2019   History of diverticulosis    History of kidney stones    HLD (hyperlipidemia)    Hyperlipidemia, unspecified    Hypertension    Nephrolithiasis    Osteoporosis, post-menopausal    Parkinson disease    Pre-diabetes    PVC's (premature ventricular contractions)    Scoliosis    Thoracic kyphosis    Trifascicular bundle branch block    a.) 1st degree AVB + RBBB + LAFB   Vascular disease    Past Surgical History:  Procedure Laterality Date   APPENDECTOMY     BREAST EXCISIONAL BIOPSY  Left 1984   neg   BREAST EXCISIONAL BIOPSY Right 1997   lipoma   CATARACT EXTRACTION W/PHACO Left 09/02/2017   Procedure: CATARACT EXTRACTION PHACO AND INTRAOCULAR LENS PLACEMENT (IOC)  LEFT;  Surgeon: Lockie Mola, MD;  Location: [facility];  Service: Ophthalmology;  Laterality: Left;   CATARACT EXTRACTION W/PHACO Right 10/01/2017   Procedure: CATARACT EXTRACTION PHACO AND INTRAOCULAR LENS PLACEMENT (IOC) RIGHT;  Surgeon: Lockie Mola, MD;  Location: [facility];  Service: Ophthalmology;  Laterality: Right;   COLONOSCOPY WITH PROPOFOL N/A 11/27/2016   Procedure: COLONOSCOPY WITH PROPOFOL;  Surgeon: Toledo, Boykin Nearing, MD;  Location: [facility];  Service: Gastroenterology;  Laterality: N/A;   CORONARY ANGIOPLASTY WITH STENT PLACEMENT Left 06/25/2013   Procedure: CORONARY ANGIOPLASTY WITH STENT PLACEMENT; Location: [facility]; Surgeon: Rolly Salter, MD   KNEE ARTHROPLASTY Left 01/22/2017   Procedure: COMPUTER ASSISTED TOTAL KNEE ARTHROPLASTY;  Surgeon: Donato Heinz, MD;  Location: [facility];  Service: Orthopedics;  Laterality: Left;   KNEE ARTHROSCOPY Left 02/01/2015   Procedure: ARTHROSCOPY KNEE, partial medial & lateral menisectomy,,medial and patelofemoral chondroplasty;  Surgeon: Donato Heinz, MD;  Location: [facility];  Service: Orthopedics;  Laterality: Left;   LEFT HEART CATH AND CORONARY ANGIOGRAPHY Left 03/10/2012   Procedure: LEFT HEART CATH AND CORONARY ANGIOGRAPHY; Location: [facility]; Surgeon: Marcina Millard, MD   LITHOTRIPSY Right 1995   RIGHT AXILLARY LIPOMA REMOVAL     TUBAL LIGATION     VARICOSE VEIN SURGERY     Patient Active Problem List   Diagnosis Date Noted   Primary osteoarthritis of hip 02/09/2017   Primary osteoarthritis of knee 02/09/2017   Chronic sinus bradycardia 01/22/2017   Degenerative disc disease, lumbar 01/22/2017   Dizziness 01/22/2017   Nephrolithiasis 01/22/2017   Osteoporosis, 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/22/2017   PVC's  (premature ventricular contractions) 01/22/2017   S/P total knee arthroplasty 01/22/2017   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   CKD (chronic kidney disease), 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   GERD (gastroesophageal reflux disease) 06/25/2013   Coronary artery disease involving native coronary artery of native heart 06/24/2013   Essential hypertension 06/21/2013   Mixed hyperlipidemia 06/21/2013    ONSET DATE: March 2019  REFERRING DIAG: Parkinson's Disease  THERAPY DIAG:  Parkinson's disease, unspecified whether dyskinesia present, unspecified whether manifestations fluctuate  Other abnormalities of gait and mobility  Unsteadiness on feet  Dysarthria and anarthria  Muscle weakness (generalized)  Rationale for Evaluation and Treatment: Rehabilitation  SUBJECTIVE:                                                                                                                                                                                             SUBJECTIVE STATEMENT: Pt reports that she is hoving a good morning. No new updates since last PT session  Pt accompanied by: self  PERTINENT HISTORY: Patient reporting to physical therapy for general unsteadiness and weakness as it relates to Parkinson's disease. Has been seen in the past for PT in 2019. PMH includes: Parkinson's disease, Vascular disease, Dizziness, DDD, GERD, arthritis, CKD, CAD. Reports no falls in the past 6 months. Is currently medicated for Parkinson's; taking medication 4x a day. She notices her biggest struggle is doing activities for longer durations of time. She also has concerns of feeling off balance when reacting quickly to environment.   PAIN:  Are you having pain? No  PRECAUTIONS: None  WEIGHT BEARING RESTRICTIONS: No  FALLS: Has patient fallen in last 6 months? No  LIVING ENVIRONMENT: Lives with: lives alone and daughter lives next door. Lives in: House/apartment Stairs: Yes: External: 4 steps;  on right going up and on left going up Has following equipment at home:  None, but has use cane in the past. Has shower rails, shower chair; doesn't use  PLOF: Independent with basic ADLs, Independent  with household mobility without device, Independent with gait, and Independent with transfers  PATIENT GOALS: Get stronger, be able to stand long enough to cook lunch/ meals  OBJECTIVE:   DIAGNOSTIC FINDINGS:   MRI THORACIC SPINE WITHOUT CONTRAST: IMPRESSION: 1. Scoliosis of the thoracic spine is convex to the left at T3. 2. Exaggerated thoracic kyphosis is stable 3. Mild right foraminal narrowing at T2-3 and T3-4. 4. Mild disc bulging and facet hypertrophy at T6-7, T7-8, T10-11, and T11-12 without significant stenosis. 5. Chronic fatty endplate marrow changes across the endplates T4-5 through T11-12 with the exception of T6-7 and T7-8 which changes are more edematous.  CHEST - 2 VIEW :IMPRESSION: No active cardiopulmonary disease.  COGNITION: Overall cognitive status: Within functional limits for tasks assessed    POSTURE: rounded shoulders, forward head, and increased thoracic kyphosis    LOWER EXTREMITY MMT:    MMT Right Eval Left Eval  Hip flexion 4- 4  Hip extension    Hip abduction 4 4  Hip adduction 4- 4-  Hip internal rotation    Hip external rotation    Knee flexion 4- 4-  Knee extension 4- 4  Ankle dorsiflexion 4- 4-  Ankle plantarflexion    Ankle inversion    Ankle eversion    (Blank rows = not tested)   GAIT: Gait pattern:  Pt ability to walk in straight line diminished as progressed, primarily drifting to R side,  and decreased stride length Distance walked: 937 ft Assistive device utilized: None Level of assistance: CGA Comments: Pt walking at slow pace, though able to continuously ambulate throughout the 6 mins.  FUNCTIONAL TESTS:  5 times sit to stand: 23.99 sec Timed up and go (TUG): 14.38 sec; 2nd attempt, needed additional cue for  correct completion of the test. 6 minute walk test: 937 ft, see additional comment per Gait assessment above. 10 meter walk test: 11.32 secs; 0.88 m/s Berg Balance Scale: 40, see details below, assessed 2nd session     PATIENT SURVEYS:  FOTO 53  TODAY'S TREATMENT:                                                                                                                              DATE: 08/07/22   TE:  NUstep reciprocal movement training with emphasis on full ROM level 2 x 1 min  level 3 x 4 min  Level 2 x 1 min   Pt performed BLE therex:  Partial squat 2 x 12  Step up on 6 inch step 2 x 8 bil  Hip abduction 3# 2 x 10  Hip extension 3# 2 x 10  Seated LAQ 2 x 8, 3#. Standing ankle PF/DF x 12 bil  Gait without resistance x 381ft with cues for increased step length and arm swing.   Sit<>stand x 8 with UE pushing form arm rest.   Throughout session PT provided supervision assist and cues for proper technique as listed below.  PATIENT EDUCATION: Education details: Educated on instruction for tests and measures, outcome measures, and POC. Pt educated throughout session about proper posture and technique with exercises. Improved exercise technique, movement at target joints, use of target muscles after min to mod verbal, visual, tactile cues.  Person educated: Patient Education method: Medical illustrator Education comprehension: verbalized understanding  HOME EXERCISE PROGRAM: Access Code: [facility] [email or web address]  Date: 07/17/2022 Prepared by: Grier Rocher Exercises - Standing Single Leg  Stance with Counter Support  - 1 x daily - 7 x weekly - 3 sets - 10 reps - 2 hold -  Seated Hip Abduction with Resistance  - 1 x daily - 7 x weekly - 3 sets - 10 reps -  Seated Long Arc Quad  - 1 x daily - 7 x weekly - 3 sets - 10 reps - 3 hold    GOALS: Goals reviewed with patient? No  SHORT TERM GOALS: Target date: 08/12/2022    Patient will  be independent in home exercise program to improve strength/mobility for better functional independence with ADLs. Baseline: No HEP currently  Goal status: INITIAL  LONG TERM GOALS: Target date: 10/07/2022   1.  Patient (> 23 years old) will complete five times sit to stand test in < 15 seconds indicating an increased LE strength and improved balance. Baseline: 23.99 sec Goal status: INITIAL  2.  Patient will increase FOTO score to equal to or greater than  59  to demonstrate statistically significant improvement in mobility and quality of life.  Baseline: 53 Goal status: INITIAL   3.  Patient will increase Berg Balance score by > 6 points to demonstrate decreased fall risk during functional activities. Baseline: Assessed 2nd session: 40 Goal status: INITIAL   4.   Patient will reduce timed up and go to <11 seconds to reduce fall risk and demonstrate improved transfer/gait ability. Baseline: 14.38 Goal status: INITIAL  5.   Patient will increase 10 meter walk test to >1.59m/s as to improve gait speed for better community ambulation and to reduce fall risk. Baseline: 0.88 m/s Goal status: INITIAL  6.   Patient will increase six minute walk test distance to >1000 for progression to community ambulator and improve gait ability Baseline: 937 feet Goal status: INITIAL     ASSESSMENT:  CLINICAL IMPRESSION:  Patient appeared motivated and ready for treatment on this day. PT treatment focused on BLE strengthening through circuit style training. Pt demonstrates increased fatigue throughout session with RPE 8/10 folowoing endurance training on nustep and 6/10 following each circuit. Pt will continue to benefit from BLE strengthening as well as improved large amplitude movement.  Pt will continue to benefit from skilled physical therapy intervention to address impairments, improve QOL, and attain therapy goals.   OBJECTIVE IMPAIRMENTS: decreased balance, decreased endurance, decreased  strength, and Abnormal Gait; R side drift .   ACTIVITY LIMITATIONS: bending, standing, and locomotion level  PARTICIPATION LIMITATIONS: meal prep, cleaning, and community activity  PERSONAL FACTORS: Age, Fitness, and Past/current experiences are also affecting patient's functional outcome.   REHAB POTENTIAL: Good  CLINICAL DECISION MAKING: Stable/uncomplicated  EVALUATION COMPLEXITY: Low  PLAN:  PT FREQUENCY: 1-2x/week  PT DURATION: 12 weeks  PLANNED INTERVENTIONS: Therapeutic exercises, Therapeutic activity, Neuromuscular re-education, Balance training, Gait training, Patient/Family education, Self Care, Joint mobilization, Vestibular training, Cryotherapy, Moist heat, and Re-evaluation  PLAN FOR NEXT SESSION:    Dynamic balance training  Re-assess HEP   Golden Pop, PT 08/07/2022, 11:02 AM    11:02  AM 08/07/22

## 2022-08-12 ENCOUNTER — Encounter: Payer: Medicare Other | Admitting: Speech Pathology

## 2022-08-13 ENCOUNTER — Ambulatory Visit: Payer: Medicare Other | Admitting: Physical Therapy

## 2022-08-15 ENCOUNTER — Encounter: Payer: Medicare Other | Admitting: Speech Pathology

## 2022-08-15 ENCOUNTER — Ambulatory Visit: Payer: Medicare Other | Admitting: Physical Therapy

## 2022-08-19 ENCOUNTER — Encounter: Payer: Medicare Other | Admitting: Speech Pathology

## 2022-08-19 ENCOUNTER — Ambulatory Visit: Payer: Medicare Other | Admitting: Physical Therapy

## 2022-08-19 DIAGNOSIS — G20A1 Parkinson's disease without dyskinesia, without mention of fluctuations: Secondary | ICD-10-CM

## 2022-08-19 DIAGNOSIS — R2681 Unsteadiness on feet: Secondary | ICD-10-CM

## 2022-08-19 DIAGNOSIS — M6281 Muscle weakness (generalized): Secondary | ICD-10-CM

## 2022-08-19 DIAGNOSIS — R2689 Other abnormalities of gait and mobility: Secondary | ICD-10-CM

## 2022-08-19 NOTE — Therapy (Signed)
OUTPATIENT PHYSICAL THERAPY NEURO TREATMENT NOTE   Patient Name: Katelyn Crawford MRN: [id number] DOB:04-13-1940, 82 y.o., female Today's Date: 08/19/2022   PCP:   Marguarite Arbour, MD   REFERRING PROVIDER: Lonell Face, MD   END OF SESSION:  PT End of Session - 08/19/22 0756     Visit Number 8    Number of Visits 24    Date for PT Re-Evaluation 10/07/22    Progress Note Due on Visit 10    PT Start Time 0801    PT Stop Time 0841    PT Time Calculation (min) 40 min    Equipment Utilized During Treatment Gait belt    Activity Tolerance Patient tolerated treatment well    Behavior During Therapy WFL for tasks assessed/performed                 Past Medical History:  Diagnosis Date   Anemia    Aortic atherosclerosis (HCC)    Arthritis    Cardiomegaly    Cardiomyopathy (HCC)    Cholelithiasis    Chronic kidney disease, stage 2 (mild)    Chronic sinus bradycardia    Coronary artery disease 03/10/2012   a.) LHC 03/10/2012: 40% mLAD, 50% pRI, 50% pRCA, 90% mRCA, 50% dRCA, 50% RPDA - unable to cannulate RCA - med mgmt; b.) LHC/PCI 06/25/2013: 30% pLAD, 40% mLAD, 20% mLCx, 30% RI, 30% pRCA, 70% mRCA (2.5 mm Promus DES), 40% dRCA-1, 90% dRCA-2 (2.5 mm Promus DES)   DDD (degenerative disc disease), thoracolumbar    Diverticulosis    Dizziness    Environmental allergies    Full dentures    GERD (gastroesophageal reflux disease) 06/25/2013   H/O bilateral cataract extraction 2019   History of diverticulosis    History of kidney stones    HLD (hyperlipidemia)    Hyperlipidemia, unspecified    Hypertension    Nephrolithiasis    Osteoporosis, post-menopausal    Parkinson disease    Pre-diabetes    PVC's (premature ventricular contractions)    Scoliosis    Thoracic kyphosis    Trifascicular bundle branch block    a.) 1st degree AVB + RBBB + LAFB   Vascular disease    Past Surgical History:  Procedure Laterality Date   APPENDECTOMY     BREAST EXCISIONAL BIOPSY  Left 1984   neg   BREAST EXCISIONAL BIOPSY Right 1997   lipoma   CATARACT EXTRACTION W/PHACO Left 09/02/2017   Procedure: CATARACT EXTRACTION PHACO AND INTRAOCULAR LENS PLACEMENT (IOC)  LEFT;  Surgeon: Lockie Mola, MD;  Location: [facility];  Service: Ophthalmology;  Laterality: Left;   CATARACT EXTRACTION W/PHACO Right 10/01/2017   Procedure: CATARACT EXTRACTION PHACO AND INTRAOCULAR LENS PLACEMENT (IOC) RIGHT;  Surgeon: Lockie Mola, MD;  Location: [facility];  Service: Ophthalmology;  Laterality: Right;   COLONOSCOPY WITH PROPOFOL N/A 11/27/2016   Procedure: COLONOSCOPY WITH PROPOFOL;  Surgeon: Toledo, Boykin Nearing, MD;  Location: [facility];  Service: Gastroenterology;  Laterality: N/A;   CORONARY ANGIOPLASTY WITH STENT PLACEMENT Left 06/25/2013   Procedure: CORONARY ANGIOPLASTY WITH STENT PLACEMENT; Location: [facility]; Surgeon: Rolly Salter, MD   KNEE ARTHROPLASTY Left 01/22/2017   Procedure: COMPUTER ASSISTED TOTAL KNEE ARTHROPLASTY;  Surgeon: Donato Heinz, MD;  Location: [facility];  Service: Orthopedics;  Laterality: Left;   KNEE ARTHROSCOPY Left 02/01/2015   Procedure: ARTHROSCOPY KNEE, partial medial & lateral menisectomy,,medial and patelofemoral chondroplasty;  Surgeon: Donato Heinz, MD;  Location: [facility];  Service: Orthopedics;  Laterality: Left;   LEFT HEART CATH AND CORONARY ANGIOGRAPHY Left 03/10/2012   Procedure: LEFT HEART CATH AND CORONARY ANGIOGRAPHY; Location: [facility]; Surgeon: Marcina Millard, MD   LITHOTRIPSY Right 1995   RIGHT AXILLARY LIPOMA REMOVAL     TUBAL LIGATION     VARICOSE VEIN SURGERY     Patient Active Problem List   Diagnosis Date Noted   Primary osteoarthritis of hip 02/09/2017   Primary osteoarthritis of knee 02/09/2017   Chronic sinus bradycardia 01/22/2017   Degenerative disc disease, lumbar 01/22/2017   Dizziness 01/22/2017   Nephrolithiasis 01/22/2017   Osteoporosis, 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/22/2017   PVC's  (premature ventricular contractions) 01/22/2017   S/P total knee arthroplasty 01/22/2017   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   CKD (chronic kidney disease), 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   GERD (gastroesophageal reflux disease) 06/25/2013   Coronary artery disease involving native coronary artery of native heart 06/24/2013   Essential hypertension 06/21/2013   Mixed hyperlipidemia 06/21/2013    ONSET DATE: March 2019  REFERRING DIAG: Parkinson's Disease  THERAPY DIAG:  Parkinson's disease, unspecified whether dyskinesia present, unspecified whether manifestations fluctuate  Other abnormalities of gait and mobility  Unsteadiness on feet  Muscle weakness (generalized)  Rationale for Evaluation and Treatment: Rehabilitation  SUBJECTIVE:                                                                                                                                                                                             SUBJECTIVE STATEMENT: Pt reports that she is hoving a good morning. No new updates since last PT session  Pt accompanied by: self  PERTINENT HISTORY: Patient reporting to physical therapy for general unsteadiness and weakness as it relates to Parkinson's disease. Has been seen in the past for PT in 2019. PMH includes: Parkinson's disease, Vascular disease, Dizziness, DDD, GERD, arthritis, CKD, CAD. Reports no falls in the past 6 months. Is currently medicated for Parkinson's; taking medication 4x a day. She notices her biggest struggle is doing activities for longer durations of time. She also has concerns of feeling off balance when reacting quickly to environment.   PAIN:  Are you having pain? No  PRECAUTIONS: None  WEIGHT BEARING RESTRICTIONS: No  FALLS: Has patient fallen in last 6 months? No  LIVING ENVIRONMENT: Lives with: lives alone and daughter lives next door. Lives in: House/apartment Stairs: Yes: External: 4 steps; on right going up and on left  going up Has following equipment at home:  None, but has use cane in the past. Has shower rails, shower chair; doesn't use  PLOF: Independent with basic ADLs, Independent with household mobility without  device, Independent with gait, and Independent with transfers  PATIENT GOALS: Get stronger, be able to stand long enough to cook lunch/ meals  OBJECTIVE:   DIAGNOSTIC FINDINGS:   MRI THORACIC SPINE WITHOUT CONTRAST: IMPRESSION: 1. Scoliosis of the thoracic spine is convex to the left at T3. 2. Exaggerated thoracic kyphosis is stable 3. Mild right foraminal narrowing at T2-3 and T3-4. 4. Mild disc bulging and facet hypertrophy at T6-7, T7-8, T10-11, and T11-12 without significant stenosis. 5. Chronic fatty endplate marrow changes across the endplates T4-5 through T11-12 with the exception of T6-7 and T7-8 which changes are more edematous.  CHEST - 2 VIEW :IMPRESSION: No active cardiopulmonary disease.  COGNITION: Overall cognitive status: Within functional limits for tasks assessed    POSTURE: rounded shoulders, forward head, and increased thoracic kyphosis    LOWER EXTREMITY MMT:    MMT Right Eval Left Eval  Hip flexion 4- 4  Hip extension    Hip abduction 4 4  Hip adduction 4- 4-  Hip internal rotation    Hip external rotation    Knee flexion 4- 4-  Knee extension 4- 4  Ankle dorsiflexion 4- 4-  Ankle plantarflexion    Ankle inversion    Ankle eversion    (Blank rows = not tested)   GAIT: Gait pattern:  Pt ability to walk in straight line diminished as progressed, primarily drifting to R side,  and decreased stride length Distance walked: 937 ft Assistive device utilized: None Level of assistance: CGA Comments: Pt walking at slow pace, though able to continuously ambulate throughout the 6 mins.  FUNCTIONAL TESTS:  5 times sit to stand: 23.99 sec Timed up and go (TUG): 14.38 sec; 2nd attempt, needed additional cue for correct completion of the test. 6  minute walk test: 937 ft, see additional comment per Gait assessment above. 10 meter walk test: 11.32 secs; 0.88 m/s Berg Balance Scale: 40, see details below, assessed 2nd session     PATIENT SURVEYS:  FOTO 53  TODAY'S TREATMENT:                                                                                                                              DATE: 08/19/22   NMR:   NUstep reciprocal movement training with emphasis on full ROM  level 1 x 2 min  level 3 x 2 min  Level 1 x 1 min  RPE 5/10 upon completion  Weighted gait training with 2.5# ankle weights on wrists 2x 129ft. Instruction for improved BUE reciprocal arm swing throughout.   Seated PWR! UP to touch shoes then perform shoulder abduction in erect sitting x 10  Sit<>stand PWR UP with hands on knees to stand then shoulder abduction with ER 2x 6   Lateral step over cane with toe out performed bilateral  x 5 with UE support, x without UE support then performed x 6 without UE support  HEP sit<>stand with UE support x  8; side stepping at rail   Pt required multiple seated rest breaks due to fatigue and pt rated difficulty of each activity as moderate.   PATIENT EDUCATION: Education details: Educated on instruction for tests and measures, outcome measures, and POC. Pt educated throughout session about proper posture and technique with exercises. Improved exercise technique, movement at target joints, use of target muscles after min to mod verbal, visual, tactile cues.  Person educated: Patient Education method: Medical illustrator Education comprehension: verbalized understanding  HOME EXERCISE PROGRAM: Access Code: [facility] [email or web address]  Date: 07/17/2022 Prepared by: Grier Rocher Exercises - Standing Single Leg  Stance with Counter Support  - 1 x daily - 7 x weekly - 3 sets - 10 reps - 2 hold -  Seated Hip Abduction with Resistance  - 1 x daily - 7 x weekly - 3 sets - 10 reps  -  Seated Long Arc Quad  - 1 x daily - 7 x weekly - 3 sets - 10 reps - 3 hold   Access Code: [email or web address] URL: [email or web address] Date: 08/19/2022 Prepared by: Grier Rocher  Exercises - Sit to Stand with Armchair  - 1 x daily - 7 x weekly - 3 sets - 8 reps - Side Stepping with Counter Support  - 1 x daily - 7 x weekly - 3 sets - 8 reps  GOALS: Goals reviewed with patient? No  SHORT TERM GOALS: Target date: 08/12/2022    Patient will be independent in home exercise program to improve strength/mobility for better functional independence with ADLs. Baseline: No HEP currently  Goal status: INITIAL  LONG TERM GOALS: Target date: 10/07/2022   1.  Patient (> 64 years old) will complete five times sit to stand test in < 15 seconds indicating an increased LE strength and improved balance. Baseline: 23.99 sec Goal status: INITIAL  2.  Patient will increase FOTO score to equal to or greater than  59  to demonstrate statistically significant improvement in mobility and quality of life.  Baseline: 53 Goal status: INITIAL   3.  Patient will increase Berg Balance score by > 6 points to demonstrate decreased fall risk during functional activities. Baseline: Assessed 2nd session: 40 Goal status: INITIAL   4.   Patient will reduce timed up and go to <11 seconds to reduce fall risk and demonstrate improved transfer/gait ability. Baseline: 14.38 Goal status: INITIAL  5.   Patient will increase 10 meter walk test to >1.37m/s as to improve gait speed for better community ambulation and to reduce fall risk. Baseline: 0.88 m/s Goal status: INITIAL  6.   Patient will increase six minute walk test distance to >1000 for progression to community ambulator and improve gait ability Baseline: 937 feet Goal status: INITIAL     ASSESSMENT:  CLINICAL IMPRESSION:  Patient appeared motivated and ready for treatment on this day. PT treatment focused on large amplitude movement to  address brady kinesia and improve muscle recruitments. Pt demonstrated improved ROM and step length with emphasis on UE swing and increased repetition. HEP modified as listed above.  Pt will continue to benefit from skilled physical therapy intervention to address impairments, improve QOL, and attain therapy goals.   OBJECTIVE IMPAIRMENTS: decreased balance, decreased endurance, decreased strength, and Abnormal Gait; R side drift .   ACTIVITY LIMITATIONS: bending, standing, and locomotion level  PARTICIPATION LIMITATIONS: meal prep, cleaning, and community activity  PERSONAL FACTORS: Age, Fitness, and Past/current experiences are also affecting patient's functional outcome.   REHAB POTENTIAL: Good  CLINICAL DECISION MAKING: Stable/uncomplicated  EVALUATION COMPLEXITY: Low  PLAN:  PT FREQUENCY: 1-2x/week  PT DURATION: 12 weeks  PLANNED INTERVENTIONS: Therapeutic exercises, Therapeutic activity, Neuromuscular re-education, Balance training, Gait training, Patient/Family education, Self Care, Joint mobilization, Vestibular training, Cryotherapy, Moist heat, and Re-evaluation  PLAN FOR NEXT SESSION:    Dynamic balance/gait training    Golden Pop, PT 08/19/2022, 9:05 AM    9:05 AM 08/19/22

## 2022-08-21 ENCOUNTER — Encounter: Payer: Medicare Other | Admitting: Speech Pathology

## 2022-08-22 ENCOUNTER — Ambulatory Visit: Payer: Medicare Other | Attending: Neurology | Admitting: Physical Therapy

## 2022-08-22 DIAGNOSIS — R2681 Unsteadiness on feet: Secondary | ICD-10-CM | POA: Insufficient documentation

## 2022-08-22 DIAGNOSIS — M6281 Muscle weakness (generalized): Secondary | ICD-10-CM | POA: Diagnosis present

## 2022-08-22 DIAGNOSIS — G20A1 Parkinson's disease without dyskinesia, without mention of fluctuations: Secondary | ICD-10-CM | POA: Insufficient documentation

## 2022-08-22 DIAGNOSIS — R471 Dysarthria and anarthria: Secondary | ICD-10-CM | POA: Insufficient documentation

## 2022-08-22 DIAGNOSIS — R2689 Other abnormalities of gait and mobility: Secondary | ICD-10-CM | POA: Insufficient documentation

## 2022-08-22 NOTE — Therapy (Signed)
OUTPATIENT PHYSICAL THERAPY NEURO TREATMENT NOTE   Patient Name: Katelyn Crawford MRN: [id number] DOB:02/14/1940, 82 y.o., female Today's Date: 08/22/2022   PCP:   Marguarite Arbour, MD   REFERRING PROVIDER: Lonell Face, MD   END OF SESSION:  PT End of Session - 08/22/22 0802     Visit Number 9    Number of Visits 24    Date for PT Re-Evaluation 10/07/22    Progress Note Due on Visit 10    PT Start Time 0804    PT Stop Time 0845    PT Time Calculation (min) 41 min    Equipment Utilized During Treatment Gait belt    Activity Tolerance Patient tolerated treatment well    Behavior During Therapy WFL for tasks assessed/performed                 Past Medical History:  Diagnosis Date   Anemia    Aortic atherosclerosis (HCC)    Arthritis    Cardiomegaly    Cardiomyopathy (HCC)    Cholelithiasis    Chronic kidney disease, stage 2 (mild)    Chronic sinus bradycardia    Coronary artery disease 03/10/2012   a.) LHC 03/10/2012: 40% mLAD, 50% pRI, 50% pRCA, 90% mRCA, 50% dRCA, 50% RPDA - unable to cannulate RCA - med mgmt; b.) LHC/PCI 06/25/2013: 30% pLAD, 40% mLAD, 20% mLCx, 30% RI, 30% pRCA, 70% mRCA (2.5 mm Promus DES), 40% dRCA-1, 90% dRCA-2 (2.5 mm Promus DES)   DDD (degenerative disc disease), thoracolumbar    Diverticulosis    Dizziness    Environmental allergies    Full dentures    GERD (gastroesophageal reflux disease) 06/25/2013   H/O bilateral cataract extraction 2019   History of diverticulosis    History of kidney stones    HLD (hyperlipidemia)    Hyperlipidemia, unspecified    Hypertension    Nephrolithiasis    Osteoporosis, post-menopausal    Parkinson disease    Pre-diabetes    PVC's (premature ventricular contractions)    Scoliosis    Thoracic kyphosis    Trifascicular bundle branch block    a.) 1st degree AVB + RBBB + LAFB   Vascular disease    Past Surgical History:  Procedure Laterality Date   APPENDECTOMY     BREAST EXCISIONAL BIOPSY  Left 1984   neg   BREAST EXCISIONAL BIOPSY Right 1997   lipoma   CATARACT EXTRACTION W/PHACO Left 09/02/2017   Procedure: CATARACT EXTRACTION PHACO AND INTRAOCULAR LENS PLACEMENT (IOC)  LEFT;  Surgeon: Lockie Mola, MD;  Location: [facility];  Service: Ophthalmology;  Laterality: Left;   CATARACT EXTRACTION W/PHACO Right 10/01/2017   Procedure: CATARACT EXTRACTION PHACO AND INTRAOCULAR LENS PLACEMENT (IOC) RIGHT;  Surgeon: Lockie Mola, MD;  Location: [facility];  Service: Ophthalmology;  Laterality: Right;   COLONOSCOPY WITH PROPOFOL N/A 11/27/2016   Procedure: COLONOSCOPY WITH PROPOFOL;  Surgeon: Toledo, Boykin Nearing, MD;  Location: [facility];  Service: Gastroenterology;  Laterality: N/A;   CORONARY ANGIOPLASTY WITH STENT PLACEMENT Left 06/25/2013   Procedure: CORONARY ANGIOPLASTY WITH STENT PLACEMENT; Location: [facility]; Surgeon: Rolly Salter, MD   KNEE ARTHROPLASTY Left 01/22/2017   Procedure: COMPUTER ASSISTED TOTAL KNEE ARTHROPLASTY;  Surgeon: Donato Heinz, MD;  Location: [facility];  Service: Orthopedics;  Laterality: Left;   KNEE ARTHROSCOPY Left 02/01/2015   Procedure: ARTHROSCOPY KNEE, partial medial & lateral menisectomy,,medial and patelofemoral chondroplasty;  Surgeon: Donato Heinz, MD;  Location: [facility];  Service: Orthopedics;  Laterality: Left;   LEFT HEART CATH AND CORONARY ANGIOGRAPHY Left 03/10/2012   Procedure: LEFT HEART CATH AND CORONARY ANGIOGRAPHY; Location: [facility]; Surgeon: Marcina Millard, MD   LITHOTRIPSY Right 1995   RIGHT AXILLARY LIPOMA REMOVAL     TUBAL LIGATION     VARICOSE VEIN SURGERY     Patient Active Problem List   Diagnosis Date Noted   Primary osteoarthritis of hip 02/09/2017   Primary osteoarthritis of knee 02/09/2017   Chronic sinus bradycardia 01/22/2017   Degenerative disc disease, lumbar 01/22/2017   Dizziness 01/22/2017   Nephrolithiasis 01/22/2017   Osteoporosis, 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/22/2017   PVC's  (premature ventricular contractions) 01/22/2017   S/P total knee arthroplasty 01/22/2017   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   CKD (chronic kidney disease), 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   GERD (gastroesophageal reflux disease) 06/25/2013   Coronary artery disease involving native coronary artery of native heart 06/24/2013   Essential hypertension 06/21/2013   Mixed hyperlipidemia 06/21/2013    ONSET DATE: March 2019  REFERRING DIAG: Parkinson's Disease  THERAPY DIAG:  Parkinson's disease, unspecified whether dyskinesia present, unspecified whether manifestations fluctuate  Other abnormalities of gait and mobility  Unsteadiness on feet  Muscle weakness (generalized)  Rationale for Evaluation and Treatment: Rehabilitation  SUBJECTIVE:                                                                                                                                                                                             SUBJECTIVE STATEMENT: Pt reports that she is doing well. Reports that after last session she was able to get off the toilet without pulling on rail, but also reports that yesterday was a rough day where she felt nauseous and slight HA that passed after a good night's sleep.   Pt accompanied by: self  PERTINENT HISTORY: Patient reporting to physical therapy for general unsteadiness and weakness as it relates to Parkinson's disease. Has been seen in the past for PT in 2019. PMH includes: Parkinson's disease, Vascular disease, Dizziness, DDD, GERD, arthritis, CKD, CAD. Reports no falls in the past 6 months. Is currently medicated for Parkinson's; taking medication 4x a day. She notices her biggest struggle is doing activities for longer durations of time. She also has concerns of feeling off balance when reacting quickly to environment.   PAIN:  Are you having pain? No  PRECAUTIONS: None  WEIGHT BEARING RESTRICTIONS: No  FALLS: Has patient fallen in last 6  months? No  LIVING ENVIRONMENT: Lives with: lives alone and daughter lives next door. Lives in: House/apartment Stairs: Yes: External: 4 steps; on right going up and on left going up  Has following equipment at home:  None, but has use cane in the past. Has shower rails, shower chair; doesn't use  PLOF: Independent with basic ADLs, Independent with household mobility without device, Independent with gait, and Independent with transfers  PATIENT GOALS: Get stronger, be able to stand long enough to cook lunch/ meals  OBJECTIVE:   DIAGNOSTIC FINDINGS:   MRI THORACIC SPINE WITHOUT CONTRAST: IMPRESSION: 1. Scoliosis of the thoracic spine is convex to the left at T3. 2. Exaggerated thoracic kyphosis is stable 3. Mild right foraminal narrowing at T2-3 and T3-4. 4. Mild disc bulging and facet hypertrophy at T6-7, T7-8, T10-11, and T11-12 without significant stenosis. 5. Chronic fatty endplate marrow changes across the endplates T4-5 through T11-12 with the exception of T6-7 and T7-8 which changes are more edematous.  CHEST - 2 VIEW :IMPRESSION: No active cardiopulmonary disease.  COGNITION: Overall cognitive status: Within functional limits for tasks assessed    POSTURE: rounded shoulders, forward head, and increased thoracic kyphosis    LOWER EXTREMITY MMT:    MMT Right Eval Left Eval  Hip flexion 4- 4  Hip extension    Hip abduction 4 4  Hip adduction 4- 4-  Hip internal rotation    Hip external rotation    Knee flexion 4- 4-  Knee extension 4- 4  Ankle dorsiflexion 4- 4-  Ankle plantarflexion    Ankle inversion    Ankle eversion    (Blank rows = not tested)   GAIT: Gait pattern:  Pt ability to walk in straight line diminished as progressed, primarily drifting to R side,  and decreased stride length Distance walked: 937 ft Assistive device utilized: None Level of assistance: CGA Comments: Pt walking at slow pace, though able to continuously ambulate  throughout the 6 mins.  FUNCTIONAL TESTS:  5 times sit to stand: 23.99 sec Timed up and go (TUG): 14.38 sec; 2nd attempt, needed additional cue for correct completion of the test. 6 minute walk test: 937 ft, see additional comment per Gait assessment above. 10 meter walk test: 11.32 secs; 0.88 m/s Berg Balance Scale: 40, see details below, assessed 2nd session     PATIENT SURVEYS:  FOTO 53  TODAY'S TREATMENT:                                                                                                                              DATE: 08/22/22   NMR:   NUstep reciprocal movement training with emphasis on full ROM  level 1 x 2 min  level 3 x 2 min  Level 1 x 1 min  RPE 6/10 upon completion  Seated PWR! UP to touch shoes then perform shoulder abduction in erect sitting 2x 10  Seated rock R and L with shoulders at 90 deg abduction 2 x 12   Sit<>stand PWR UP with hands on knees to stand then shoulder abduction with ER 2x 6  Forward step with shoulder opening to 90  deg abduction 2 x 6 bil  Lateral step with hip ER and ipsilateral UE opening to 90 deg abduction, UE support on rail. 2 x 10 bil   Gait without resistance x 115ft with min cues for increased candence and Improved reciprocal UE movement on the L side.   Pt required multiple seated rest breaks due to fatigue and pt rated difficulty of each activity as moderate.   PATIENT EDUCATION: Education details: Educated on instruction for tests and measures, outcome measures, and POC. Pt educated throughout session about proper posture and technique with exercises. Improved exercise technique, movement at target joints, use of target muscles after min to mod verbal, visual, tactile cues.  Person educated: Patient Education method: Medical illustrator Education comprehension: verbalized understanding  HOME EXERCISE PROGRAM: Access Code: [facility] [email or web address]  Date: 07/17/2022 Prepared by:  Grier Rocher Exercises - Standing Single Leg  Stance with Counter Support  - 1 x daily - 7 x weekly - 3 sets - 10 reps - 2 hold -  Seated Hip Abduction with Resistance  - 1 x daily - 7 x weekly - 3 sets - 10 reps -  Seated Long Arc Quad  - 1 x daily - 7 x weekly - 3 sets - 10 reps - 3 hold   Access Code: [email or web address] URL: [email or web address] Date: 08/19/2022 Prepared by: Grier Rocher  Exercises - Sit to Stand with Armchair  - 1 x daily - 7 x weekly - 3 sets - 8 reps - Side Stepping with Counter Support  - 1 x daily - 7 x weekly - 3 sets - 8 reps  GOALS: Goals reviewed with patient? No  SHORT TERM GOALS: Target date: 08/12/2022    Patient will be independent in home exercise program to improve strength/mobility for better functional independence with ADLs. Baseline: No HEP currently  Goal status: INITIAL  LONG TERM GOALS: Target date: 10/07/2022   1.  Patient (> 59 years old) will complete five times sit to stand test in < 15 seconds indicating an increased LE strength and improved balance. Baseline: 23.99 sec Goal status: INITIAL  2.  Patient will increase FOTO score to equal to or greater than  59  to demonstrate statistically significant improvement in mobility and quality of life.  Baseline: 53 Goal status: INITIAL   3.  Patient will increase Berg Balance score by > 6 points to demonstrate decreased fall risk during functional activities. Baseline: Assessed 2nd session: 40 Goal status: INITIAL   4.   Patient will reduce timed up and go to <11 seconds to reduce fall risk and demonstrate improved transfer/gait ability. Baseline: 14.38 Goal status: INITIAL  5.   Patient will increase 10 meter walk test to >1.25m/s as to improve gait speed for better community ambulation and to reduce fall risk. Baseline: 0.88 m/s Goal status: INITIAL  6.   Patient will increase six minute walk test distance to >1000 for progression to community ambulator and improve gait  ability Baseline: 937 feet Goal status: INITIAL     ASSESSMENT:  CLINICAL IMPRESSION:  Patient appeared motivated and ready for treatment on this day. PT treatment focused on large amplitude movement to address brady kinesia and improve muscle recruitments. Noted to have increased fatigue on this day, requiring increased rest break between bouts.  Pt will continue to benefit from skilled physical therapy intervention to address impairments, improve QOL, and attain therapy goals.   OBJECTIVE IMPAIRMENTS: decreased balance, decreased endurance, decreased strength, and Abnormal Gait; R side  drift .   ACTIVITY LIMITATIONS: bending, standing, and locomotion level  PARTICIPATION LIMITATIONS: meal prep, cleaning, and community activity  PERSONAL FACTORS: Age, Fitness, and Past/current experiences are also affecting patient's functional outcome.   REHAB POTENTIAL: Good  CLINICAL DECISION MAKING: Stable/uncomplicated  EVALUATION COMPLEXITY: Low  PLAN:  PT FREQUENCY: 1-2x/week  PT DURATION: 12 weeks  PLANNED INTERVENTIONS: Therapeutic exercises, Therapeutic activity, Neuromuscular re-education, Balance training, Gait training, Patient/Family education, Self Care, Joint mobilization, Vestibular training, Cryotherapy, Moist heat, and Re-evaluation  PLAN FOR NEXT SESSION:   Goal assessment for progress note.    Golden Pop, PT 08/22/2022, 8:48 AM    8:48 AM 08/22/22

## 2022-08-27 ENCOUNTER — Ambulatory Visit: Payer: Medicare Other | Admitting: Physical Therapy

## 2022-08-27 DIAGNOSIS — R2689 Other abnormalities of gait and mobility: Secondary | ICD-10-CM

## 2022-08-27 DIAGNOSIS — M6281 Muscle weakness (generalized): Secondary | ICD-10-CM

## 2022-08-27 DIAGNOSIS — G20A1 Parkinson's disease without dyskinesia, without mention of fluctuations: Secondary | ICD-10-CM

## 2022-08-27 DIAGNOSIS — R2681 Unsteadiness on feet: Secondary | ICD-10-CM

## 2022-08-27 NOTE — Therapy (Signed)
OUTPATIENT PHYSICAL THERAPY NEURO TREATMENT NOTE/ PHYSICAL THERAPY PROGRESS NOTE   Dates of reporting period  07/15/2022   to   08/27/2022     Patient Name: Katelyn Crawford MRN: [id number] DOB:Jul 31, 1940, 82 y.o., female Today's Date: 08/27/2022   PCP:   Marguarite Arbour, MD   REFERRING PROVIDER: Lonell Face, MD   END OF SESSION:  PT End of Session - 08/27/22 1101     Visit Number 10    Number of Visits 24    Date for PT Re-Evaluation 10/07/22    Progress Note Due on Visit 10    PT Start Time 1019    PT Stop Time 1105    PT Time Calculation (min) 46 min    Equipment Utilized During Treatment Gait belt    Activity Tolerance Patient tolerated treatment well    Behavior During Therapy WFL for tasks assessed/performed                  Past Medical History:  Diagnosis Date   Anemia    Aortic atherosclerosis (HCC)    Arthritis    Cardiomegaly    Cardiomyopathy (HCC)    Cholelithiasis    Chronic kidney disease, stage 2 (mild)    Chronic sinus bradycardia    Coronary artery disease 03/10/2012   a.) LHC 03/10/2012: 40% mLAD, 50% pRI, 50% pRCA, 90% mRCA, 50% dRCA, 50% RPDA - unable to cannulate RCA - med mgmt; b.) LHC/PCI 06/25/2013: 30% pLAD, 40% mLAD, 20% mLCx, 30% RI, 30% pRCA, 70% mRCA (2.5 mm Promus DES), 40% dRCA-1, 90% dRCA-2 (2.5 mm Promus DES)   DDD (degenerative disc disease), thoracolumbar    Diverticulosis    Dizziness    Environmental allergies    Full dentures    GERD (gastroesophageal reflux disease) 06/25/2013   H/O bilateral cataract extraction 2019   History of diverticulosis    History of kidney stones    HLD (hyperlipidemia)    Hyperlipidemia, unspecified    Hypertension    Nephrolithiasis    Osteoporosis, post-menopausal    Parkinson disease    Pre-diabetes    PVC's (premature ventricular contractions)    Scoliosis    Thoracic kyphosis    Trifascicular bundle branch block    a.) 1st degree AVB + RBBB + LAFB   Vascular disease     Past Surgical History:  Procedure Laterality Date   APPENDECTOMY     BREAST EXCISIONAL BIOPSY Left 1984   neg   BREAST EXCISIONAL BIOPSY Right 1997   lipoma   CATARACT EXTRACTION W/PHACO Left 09/02/2017   Procedure: CATARACT EXTRACTION PHACO AND INTRAOCULAR LENS PLACEMENT (IOC)  LEFT;  Surgeon: Lockie Mola, MD;  Location: [facility];  Service: Ophthalmology;  Laterality: Left;   CATARACT EXTRACTION W/PHACO Right 10/01/2017   Procedure: CATARACT EXTRACTION PHACO AND INTRAOCULAR LENS PLACEMENT (IOC) RIGHT;  Surgeon: Lockie Mola, MD;  Location: [facility];  Service: Ophthalmology;  Laterality: Right;   COLONOSCOPY WITH PROPOFOL N/A 11/27/2016   Procedure: COLONOSCOPY WITH PROPOFOL;  Surgeon: Toledo, Boykin Nearing, MD;  Location: [facility];  Service: Gastroenterology;  Laterality: N/A;   CORONARY ANGIOPLASTY WITH STENT PLACEMENT Left 06/25/2013   Procedure: CORONARY ANGIOPLASTY WITH STENT PLACEMENT; Location: [facility]; Surgeon: Rolly Salter, MD   KNEE ARTHROPLASTY Left 01/22/2017   Procedure: COMPUTER ASSISTED TOTAL KNEE ARTHROPLASTY;  Surgeon: Donato Heinz, MD;  Location: [facility];  Service: Orthopedics;  Laterality: Left;   KNEE ARTHROSCOPY Left 02/01/2015   Procedure: ARTHROSCOPY KNEE, partial  medial & lateral menisectomy,,medial and patelofemoral chondroplasty;  Surgeon: Donato Heinz, MD;  Location: [facility];  Service: Orthopedics;  Laterality: Left;   LEFT HEART CATH AND CORONARY ANGIOGRAPHY Left 03/10/2012   Procedure: LEFT HEART CATH AND CORONARY ANGIOGRAPHY; Location: [facility]; Surgeon: Marcina Millard, MD   LITHOTRIPSY Right 1995   RIGHT AXILLARY LIPOMA REMOVAL     TUBAL LIGATION     VARICOSE VEIN SURGERY     Patient Active Problem List   Diagnosis Date Noted   Primary osteoarthritis of hip 02/09/2017   Primary osteoarthritis of knee 02/09/2017   Chronic sinus bradycardia 01/22/2017   Degenerative disc disease, lumbar 01/22/2017   Dizziness  01/22/2017   Nephrolithiasis 01/22/2017   Osteoporosis, 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/22/2017   PVC's (premature ventricular contractions) 01/22/2017   S/P total knee arthroplasty 01/22/2017   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   CKD (chronic kidney disease), 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   GERD (gastroesophageal reflux disease) 06/25/2013   Coronary artery disease involving native coronary artery of native heart 06/24/2013   Essential hypertension 06/21/2013   Mixed hyperlipidemia 06/21/2013    ONSET DATE: March 2019  REFERRING DIAG: Parkinson's Disease  THERAPY DIAG:  Parkinson's disease, unspecified whether dyskinesia present, unspecified whether manifestations fluctuate  Other abnormalities of gait and mobility  Unsteadiness on feet  Muscle weakness (generalized)  Rationale for Evaluation and Treatment: Rehabilitation  SUBJECTIVE:                                                                                                                                                                                             SUBJECTIVE STATEMENT: Pt reports that she is doing well. States that she feels better than last week and was able to sleep well over the weekend.   Pt accompanied by: self  PERTINENT HISTORY: Patient reporting to physical therapy for general unsteadiness and weakness as it relates to Parkinson's disease. Has been seen in the past for PT in 2019. PMH includes: Parkinson's disease, Vascular disease, Dizziness, DDD, GERD, arthritis, CKD, CAD. Reports no falls in the past 6 months. Is currently medicated for Parkinson's; taking medication 4x a day. She notices her biggest struggle is doing activities for longer durations of time. She also has concerns of feeling off balance when reacting quickly to environment.   PAIN:  Are you having pain? No  PRECAUTIONS: None  WEIGHT BEARING RESTRICTIONS: No  FALLS: Has patient fallen in last 6 months? No  LIVING ENVIRONMENT: Lives  with: lives alone and daughter lives next door. Lives in: House/apartment Stairs: Yes: External: 4 steps; on right going up and on left going up Has following  equipment at home:  None, but has use cane in the past. Has shower rails, shower chair; doesn't use  PLOF: Independent with basic ADLs, Independent with household mobility without device, Independent with gait, and Independent with transfers  PATIENT GOALS: Get stronger, be able to stand long enough to cook lunch/ meals  OBJECTIVE:   DIAGNOSTIC FINDINGS:   MRI THORACIC SPINE WITHOUT CONTRAST: IMPRESSION: 1. Scoliosis of the thoracic spine is convex to the left at T3. 2. Exaggerated thoracic kyphosis is stable 3. Mild right foraminal narrowing at T2-3 and T3-4. 4. Mild disc bulging and facet hypertrophy at T6-7, T7-8, T10-11, and T11-12 without significant stenosis. 5. Chronic fatty endplate marrow changes across the endplates T4-5 through T11-12 with the exception of T6-7 and T7-8 which changes are more edematous.  CHEST - 2 VIEW :IMPRESSION: No active cardiopulmonary disease.  COGNITION: Overall cognitive status: Within functional limits for tasks assessed    POSTURE: rounded shoulders, forward head, and increased thoracic kyphosis    LOWER EXTREMITY MMT:    MMT Right Eval Left Eval  Hip flexion 4- 4  Hip extension    Hip abduction 4 4  Hip adduction 4- 4-  Hip internal rotation    Hip external rotation    Knee flexion 4- 4-  Knee extension 4- 4  Ankle dorsiflexion 4- 4-  Ankle plantarflexion    Ankle inversion    Ankle eversion    (Blank rows = not tested)   GAIT: Gait pattern:  Pt ability to walk in straight line diminished as progressed, primarily drifting to R side,  and decreased stride length Distance walked: 937 ft Assistive device utilized: None Level of assistance: CGA Comments: Pt walking at slow pace, though able to continuously ambulate throughout the 6 mins.  FUNCTIONAL TESTS:  5  times sit to stand: 23.99 sec Timed up and go (TUG): 14.38 sec; 2nd attempt, needed additional cue for correct completion of the test. 6 minute walk test: 937 ft, see additional comment per Gait assessment above. 10 meter walk test: 11.32 secs; 0.88 m/s Berg Balance Scale: 40, see details below, assessed 2nd session     PATIENT SURVEYS:  FOTO 53  TODAY'S TREATMENT:                                                                                                                              DATE: 08/27/22   NMR:   NUstep reciprocal movement training with emphasis on full ROM  Level 2 x 5 min   Dynamic movement in parallel bars.   Sit<>stand no UE support 3x5  Resisted gait on MATRIX  Pt performed 5 time sit<>stand (5xSTS): 14.81 sec (>15 sec indicates increased fall risk)  PT instructed pt in TUG: 11.97 sec (average of 3 trials; >13.5 sec indicates increased fall risk)  10 Meter Walk Test: Patient instructed to walk 10 meters (32.8 ft) as quickly and as safely as possible at their normal speed  x2 and at a fast speed x2. Time measured from 2 meter mark to 8 meter mark to accommodate ramp-up and ramp-down.  Normal speed 1: 9.34sc  Normal speed 2: 9.89 sec Average Normal speed:  (1.17m/s) m/s Cut off scores: <0.4 m/s = household Ambulator, 0.4-0.8 m/s = limited community Ambulator, >0.8 m/s = community Ambulator, >1.2 m/s = crossing a street, <1.0 = increased fall risk MCID 0.05 m/s (small), 0.13 m/s (moderate), 0.06 m/s (significant)  (ANPTA Core Set of Outcome Measures for Adults with Neurologic Conditions, 2018)    Pt required multiple seated rest breaks due to fatigue and pt rated difficulty of each activity as moderate.   PATIENT EDUCATION: Education details: Educated on instruction for tests and measures, outcome measures, and POC. Pt educated throughout session about proper posture and technique with exercises. Improved exercise technique, movement at target joints, use of  target muscles after min to mod verbal, visual, tactile cues.  Person educated: Patient Education method: Medical illustrator Education comprehension: verbalized understanding  HOME EXERCISE PROGRAM: Access Code: [facility] [email or web address]  Date: 07/17/2022 Prepared by: Grier Rocher Exercises - Standing Single Leg  Stance with Counter Support  - 1 x daily - 7 x weekly - 3 sets - 10 reps - 2 hold -  Seated Hip Abduction with Resistance  - 1 x daily - 7 x weekly - 3 sets - 10 reps -  Seated Long Arc Quad  - 1 x daily - 7 x weekly - 3 sets - 10 reps - 3 hold   Access Code: [email or web address] URL: [email or web address] Date: 08/19/2022 Prepared by: Grier Rocher  Exercises - Sit to Stand with Armchair  - 1 x daily - 7 x weekly - 3 sets - 8 reps - Side Stepping with Counter Support  - 1 x daily - 7 x weekly - 3 sets - 8 reps  GOALS: Goals reviewed with patient? No  SHORT TERM GOALS: Target date: 08/12/2022    Patient will be independent in home exercise program to improve strength/mobility for better functional independence with ADLs. Baseline: provided  Goal status:  Progressing   LONG TERM GOALS: Target date: 10/07/2022   1.  Patient (> 2 years old) will complete five times sit to stand test in < 15 seconds indicating an increased LE strength and improved balance. Baseline: 23.99 sec 8/6 Goal status: 14.81 MET  2.  Patient will increase FOTO score to equal to or greater than  59  to demonstrate statistically significant improvement in mobility and quality of life.  Baseline: 53 8/6 59 Goal status:MET 3.  Patient will increase Berg Balance score by > 6 points to demonstrate decreased fall risk during functional activities. Baseline: Assessed 2nd session: 40 Goal status: INITIAL   4.   Patient will reduce timed up and go to <11 seconds to reduce fall risk and demonstrate improved transfer/gait ability. Baseline: 14.38 8/6: 11.97 Goal  status:progressing   5.   Patient will increase 10 meter walk test to >1.41m/s as to improve gait speed for better community ambulation and to reduce fall risk. Baseline: 0.88 m/s 8/6: 9.34, 9.89 (1.42m/s) Goal status: Met  6.   Patient will increase six minute walk test distance to >1000 for progression to community ambulator and improve gait ability Baseline: 937 feet Goal status: INITIAL     ASSESSMENT:  CLINICAL IMPRESSION:  Patient Arrived to PT motivated to participate. PT treatment focused on dynamic balance and strengthening as well as assessment of LTG to measure progress  from PT servicies. Pt noted to have improved score on FOTO and time on 5xSTS, and TUG, demonstrating reduced fall risk and access to community; meeting 3 of 6 goals. Pt will need to complete Berg and 6 min walk test at next session. Patient's condition has the potential to improve in response to therapy. Maximum improvement is yet to be obtained. The anticipated improvement is attainable and reasonable in a generally predictable time.  Pt will continue to benefit from skilled physical therapy intervention to address impairments, improve QOL, and attain therapy goals.   OBJECTIVE IMPAIRMENTS: decreased balance, decreased endurance, decreased strength, and Abnormal Gait; R side drift .   ACTIVITY LIMITATIONS: bending, standing, and locomotion level  PARTICIPATION LIMITATIONS: meal prep, cleaning, and community activity  PERSONAL FACTORS: Age, Fitness, and Past/current experiences are also affecting patient's functional outcome.   REHAB POTENTIAL: Good  CLINICAL DECISION MAKING: Stable/uncomplicated  EVALUATION COMPLEXITY: Low  PLAN:  PT FREQUENCY: 1-2x/week  PT DURATION: 12 weeks  PLANNED INTERVENTIONS: Therapeutic exercises, Therapeutic activity, Neuromuscular re-education, Balance training, Gait training, Patient/Family education, Self Care, Joint mobilization, Vestibular training,  Cryotherapy, Moist heat, and Re-evaluation  PLAN FOR NEXT SESSION:   Complete Berg and walk test. Advance HEP.    Golden Pop, PT 08/27/2022, 12:08 PM    12:08 PM 08/27/22

## 2022-08-29 ENCOUNTER — Ambulatory Visit: Payer: Medicare Other

## 2022-09-04 ENCOUNTER — Ambulatory Visit: Payer: Medicare Other

## 2022-09-04 DIAGNOSIS — G20A1 Parkinson's disease without dyskinesia, without mention of fluctuations: Secondary | ICD-10-CM

## 2022-09-04 DIAGNOSIS — M6281 Muscle weakness (generalized): Secondary | ICD-10-CM

## 2022-09-04 DIAGNOSIS — R471 Dysarthria and anarthria: Secondary | ICD-10-CM

## 2022-09-04 DIAGNOSIS — R2681 Unsteadiness on feet: Secondary | ICD-10-CM

## 2022-09-04 DIAGNOSIS — R2689 Other abnormalities of gait and mobility: Secondary | ICD-10-CM

## 2022-09-04 NOTE — Therapy (Signed)
OUTPATIENT PHYSICAL THERAPY NEURO TREATMENT NOTE    Patient Name: Katelyn Crawford MRN: [id number] DOB:12-07-1940, 82 y.o., female Today's Date: 09/04/2022   PCP:   Marguarite Arbour, MD   REFERRING PROVIDER: Lonell Face, MD   END OF SESSION:  PT End of Session - 09/04/22 1402     Visit Number 11    Number of Visits 24    Date for PT Re-Evaluation 10/07/22    Progress Note Due on Visit 10    PT Start Time 1402    Equipment Utilized During Treatment Gait belt    Activity Tolerance Patient tolerated treatment well    Behavior During Therapy WFL for tasks assessed/performed                  Past Medical History:  Diagnosis Date   Anemia    Aortic atherosclerosis (HCC)    Arthritis    Cardiomegaly    Cardiomyopathy (HCC)    Cholelithiasis    Chronic kidney disease, stage 2 (mild)    Chronic sinus bradycardia    Coronary artery disease 03/10/2012   a.) LHC 03/10/2012: 40% mLAD, 50% pRI, 50% pRCA, 90% mRCA, 50% dRCA, 50% RPDA - unable to cannulate RCA - med mgmt; b.) LHC/PCI 06/25/2013: 30% pLAD, 40% mLAD, 20% mLCx, 30% RI, 30% pRCA, 70% mRCA (2.5 mm Promus DES), 40% dRCA-1, 90% dRCA-2 (2.5 mm Promus DES)   DDD (degenerative disc disease), thoracolumbar    Diverticulosis    Dizziness    Environmental allergies    Full dentures    GERD (gastroesophageal reflux disease) 06/25/2013   H/O bilateral cataract extraction 2019   History of diverticulosis    History of kidney stones    HLD (hyperlipidemia)    Hyperlipidemia, unspecified    Hypertension    Nephrolithiasis    Osteoporosis, post-menopausal    Parkinson disease    Pre-diabetes    PVC's (premature ventricular contractions)    Scoliosis    Thoracic kyphosis    Trifascicular bundle branch block    a.) 1st degree AVB + RBBB + LAFB   Vascular disease    Past Surgical History:  Procedure Laterality Date   APPENDECTOMY     BREAST EXCISIONAL BIOPSY Left 1984   neg   BREAST EXCISIONAL BIOPSY Right 1997    lipoma   CATARACT EXTRACTION W/PHACO Left 09/02/2017   Procedure: CATARACT EXTRACTION PHACO AND INTRAOCULAR LENS PLACEMENT (IOC)  LEFT;  Surgeon: Lockie Mola, MD;  Location: [facility];  Service: Ophthalmology;  Laterality: Left;   CATARACT EXTRACTION W/PHACO Right 10/01/2017   Procedure: CATARACT EXTRACTION PHACO AND INTRAOCULAR LENS PLACEMENT (IOC) RIGHT;  Surgeon: Lockie Mola, MD;  Location: [facility];  Service: Ophthalmology;  Laterality: Right;   COLONOSCOPY WITH PROPOFOL N/A 11/27/2016   Procedure: COLONOSCOPY WITH PROPOFOL;  Surgeon: Toledo, Boykin Nearing, MD;  Location: [facility];  Service: Gastroenterology;  Laterality: N/A;   CORONARY ANGIOPLASTY WITH STENT PLACEMENT Left 06/25/2013   Procedure: CORONARY ANGIOPLASTY WITH STENT PLACEMENT; Location: [facility]; Surgeon: Rolly Salter, MD   KNEE ARTHROPLASTY Left 01/22/2017   Procedure: COMPUTER ASSISTED TOTAL KNEE ARTHROPLASTY;  Surgeon: Donato Heinz, MD;  Location: [facility];  Service: Orthopedics;  Laterality: Left;   KNEE ARTHROSCOPY Left 02/01/2015   Procedure: ARTHROSCOPY KNEE, partial medial & lateral menisectomy,,medial and patelofemoral chondroplasty;  Surgeon: Donato Heinz, MD;  Location: [facility];  Service: Orthopedics;  Laterality: Left;   LEFT HEART CATH AND CORONARY ANGIOGRAPHY Left 03/10/2012  Procedure: LEFT HEART CATH AND CORONARY ANGIOGRAPHY; Location: [facility]; Surgeon: Marcina Millard, MD   LITHOTRIPSY Right 1995   RIGHT AXILLARY LIPOMA REMOVAL     TUBAL LIGATION     VARICOSE VEIN SURGERY     Patient Active Problem List   Diagnosis Date Noted   Primary osteoarthritis of hip 02/09/2017   Primary osteoarthritis of knee 02/09/2017   Chronic sinus bradycardia 01/22/2017   Degenerative disc disease, lumbar 01/22/2017   Dizziness 01/22/2017   Nephrolithiasis 01/22/2017   Osteoporosis, 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/22/2017   PVC's (premature ventricular contractions) 01/22/2017   S/P total  knee arthroplasty 01/22/2017   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   CKD (chronic kidney disease), 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   GERD (gastroesophageal reflux disease) 06/25/2013   Coronary artery disease involving native coronary artery of native heart 06/24/2013   Essential hypertension 06/21/2013   Mixed hyperlipidemia 06/21/2013    ONSET DATE: March 2019  REFERRING DIAG: Parkinson's Disease  THERAPY DIAG:  Parkinson's disease, unspecified whether dyskinesia present, unspecified whether manifestations fluctuate  Other abnormalities of gait and mobility  Unsteadiness on feet  Muscle weakness (generalized)  Dysarthria and anarthria  Rationale for Evaluation and Treatment: Rehabilitation  SUBJECTIVE:                                                                                                                                                                                             SUBJECTIVE STATEMENT: Pt reports doing well without any new complaints. Pt accompanied by: self  PERTINENT HISTORY: Patient reporting to physical therapy for general unsteadiness and weakness as it relates to Parkinson's disease. Has been seen in the past for PT in 2019. PMH includes: Parkinson's disease, Vascular disease, Dizziness, DDD, GERD, arthritis, CKD, CAD. Reports no falls in the past 6 months. Is currently medicated for Parkinson's; taking medication 4x a day. She notices her biggest struggle is doing activities for longer durations of time. She also has concerns of feeling off balance when reacting quickly to environment.   PAIN:  Are you having pain? No  PRECAUTIONS: None  WEIGHT BEARING RESTRICTIONS: No  FALLS: Has patient fallen in last 6 months? No  LIVING ENVIRONMENT: Lives with: lives alone and daughter lives next door. Lives in: House/apartment Stairs: Yes: External: 4 steps; on right going up and on left going up Has following equipment at home:  None, but has use cane  in the past. Has shower rails, shower chair; doesn't use  PLOF: Independent with basic ADLs, Independent with household mobility without device, Independent with gait, and Independent with transfers  PATIENT GOALS: Get stronger, be able to stand long enough  to cook lunch/ meals  OBJECTIVE:   DIAGNOSTIC FINDINGS:   MRI THORACIC SPINE WITHOUT CONTRAST: IMPRESSION: 1. Scoliosis of the thoracic spine is convex to the left at T3. 2. Exaggerated thoracic kyphosis is stable 3. Mild right foraminal narrowing at T2-3 and T3-4. 4. Mild disc bulging and facet hypertrophy at T6-7, T7-8, T10-11, and T11-12 without significant stenosis. 5. Chronic fatty endplate marrow changes across the endplates T4-5 through T11-12 with the exception of T6-7 and T7-8 which changes are more edematous.  CHEST - 2 VIEW :IMPRESSION: No active cardiopulmonary disease.  COGNITION: Overall cognitive status: Within functional limits for tasks assessed    POSTURE: rounded shoulders, forward head, and increased thoracic kyphosis    LOWER EXTREMITY MMT:    MMT Right Eval Left Eval  Hip flexion 4- 4  Hip extension    Hip abduction 4 4  Hip adduction 4- 4-  Hip internal rotation    Hip external rotation    Knee flexion 4- 4-  Knee extension 4- 4  Ankle dorsiflexion 4- 4-  Ankle plantarflexion    Ankle inversion    Ankle eversion    (Blank rows = not tested)   GAIT: Gait pattern:  Pt ability to walk in straight line diminished as progressed, primarily drifting to R side,  and decreased stride length Distance walked: 937 ft Assistive device utilized: None Level of assistance: CGA Comments: Pt walking at slow pace, though able to continuously ambulate throughout the 6 mins.  FUNCTIONAL TESTS:  5 times sit to stand: 23.99 sec Timed up and go (TUG): 14.38 sec; 2nd attempt, needed additional cue for correct completion of the test. 6 minute walk test: 937 ft, see additional comment per Gait  assessment above. 10 meter walk test: 11.32 secs; 0.88 m/s Berg Balance Scale: 40, see details below, assessed 2nd session     PATIENT SURVEYS:  FOTO 53  TODAY'S TREATMENT:                                                                                                                              DATE: 09/04/22   THEREX:   Seated thoracic rotation (open book- closed book to left then to right) x 15 reps Posterior shoulder rolls 2 sets x 10 reps  Sit to stand with Overhead shoulder diagonal raises- x 10 reps  Standing posture stretch at wall x 60 sec x 2.    NMR:  Seated running man (alt UE and opp LE) x 20 reps x 2 sets (very difficult to coordinate movement   Lateral step with hip ER and ipsilateral UE opening to 90 deg abduction, UE support on rail. 2 x 10 bil  Lateral step with hip ER and ipsilateral UE opening to 90 deg abduction, UE support on rail. 2 x 10 bil   Gait without resistance x 330 feet with reciprocal UE/LE movement and only VC to "look ahead" rather than her normal of looking down.  Pt required multiple seated rest breaks due to fatigue and pt rated difficulty of each activity as moderate.   PATIENT EDUCATION: Education details: Educated on instruction for tests and measures, outcome measures, and POC. Pt educated throughout session about proper posture and technique with exercises. Improved exercise technique, movement at target joints, use of target muscles after min to mod verbal, visual, tactile cues.  Person educated: Patient Education method: Medical illustrator Education comprehension: verbalized understanding  HOME EXERCISE PROGRAM: Access Code: [facility] [email or web address]  Date: 07/17/2022 Prepared by: Grier Rocher Exercises - Standing Single Leg  Stance with Counter Support  - 1 x daily - 7 x weekly - 3 sets - 10 reps - 2 hold -  Seated Hip Abduction with Resistance  - 1 x daily - 7 x weekly - 3 sets - 10 reps -   Seated Long Arc Quad  - 1 x daily - 7 x weekly - 3 sets - 10 reps - 3 hold   Access Code: [email or web address] URL: [email or web address] Date: 08/19/2022 Prepared by: Grier Rocher  Exercises - Sit to Stand with Armchair  - 1 x daily - 7 x weekly - 3 sets - 8 reps - Side Stepping with Counter Support  - 1 x daily - 7 x weekly - 3 sets - 8 reps  GOALS: Goals reviewed with patient? No  SHORT TERM GOALS: Target date: 08/12/2022    Patient will be independent in home exercise program to improve strength/mobility for better functional independence with ADLs. Baseline: provided  Goal status:  Progressing   LONG TERM GOALS: Target date: 10/07/2022   1.  Patient (> 81 years old) will complete five times sit to stand test in < 15 seconds indicating an increased LE strength and improved balance. Baseline: 23.99 sec 8/6 Goal status: 14.81 MET  2.  Patient will increase FOTO score to equal to or greater than  59  to demonstrate statistically significant improvement in mobility and quality of life.  Baseline: 53 8/6 59 Goal status:MET 3.  Patient will increase Berg Balance score by > 6 points to demonstrate decreased fall risk during functional activities. Baseline: Assessed 2nd session: 40 Goal status: INITIAL   4.   Patient will reduce timed up and go to <11 seconds to reduce fall risk and demonstrate improved transfer/gait ability. Baseline: 14.38 8/6: 11.97 Goal status:progressing   5.   Patient will increase 10 meter walk test to >1.37m/s as to improve gait speed for better community ambulation and to reduce fall risk. Baseline: 0.88 m/s 8/6: 9.34, 9.89 (1.40m/s) Goal status: Met  6.   Patient will increase six minute walk test distance to >1000 for progression to community ambulator and improve gait ability Baseline: 937 feet Goal status: INITIAL     ASSESSMENT:  CLINICAL IMPRESSION:  Pt will continue to benefit from skilled physical therapy intervention to  address impairments, improve QOL, and attain therapy goals.   OBJECTIVE IMPAIRMENTS: decreased balance, decreased endurance, decreased strength, and Abnormal Gait; R side drift .   ACTIVITY LIMITATIONS: bending, standing, and locomotion level  PARTICIPATION LIMITATIONS: meal prep, cleaning, and community activity  PERSONAL FACTORS: Age, Fitness, and Past/current experiences are also affecting patient's functional outcome.   REHAB POTENTIAL: Good  CLINICAL DECISION MAKING: Stable/uncomplicated  EVALUATION COMPLEXITY: Low  PLAN:  PT FREQUENCY: 1-2x/week  PT DURATION: 12 weeks  PLANNED INTERVENTIONS: Therapeutic exercises, Therapeutic activity, Neuromuscular re-education, Balance training, Gait training, Patient/Family education, Self Care, Joint mobilization, Vestibular training, Cryotherapy, Moist heat, and Re-evaluation  PLAN FOR NEXT SESSION:   Complete Berg and walk test. Advance HEP.    Lenda Kelp, PT 09/04/2022, 2:02 PM    2:02 PM 09/04/22

## 2022-09-09 ENCOUNTER — Ambulatory Visit: Payer: Medicare Other | Admitting: Physical Therapy

## 2022-09-09 DIAGNOSIS — G20A1 Parkinson's disease without dyskinesia, without mention of fluctuations: Secondary | ICD-10-CM

## 2022-09-09 DIAGNOSIS — R2689 Other abnormalities of gait and mobility: Secondary | ICD-10-CM

## 2022-09-09 DIAGNOSIS — M6281 Muscle weakness (generalized): Secondary | ICD-10-CM

## 2022-09-09 DIAGNOSIS — R2681 Unsteadiness on feet: Secondary | ICD-10-CM

## 2022-09-09 NOTE — Therapy (Signed)
OUTPATIENT PHYSICAL THERAPY NEURO TREATMENT NOTE    Patient Name: Katelyn Crawford MRN: [id number] DOB:1940/07/16, 82 y.o., female Today's Date: 09/09/2022   PCP:   Marguarite Arbour, MD   REFERRING PROVIDER: Lonell Face, MD   END OF SESSION:  PT End of Session - 09/09/22 0904     Visit Number 12    Number of Visits 24    Date for PT Re-Evaluation 10/07/22    Progress Note Due on Visit 10    PT Start Time 0851    PT Stop Time 0930    PT Time Calculation (min) 39 min    Equipment Utilized During Treatment Gait belt    Activity Tolerance Patient tolerated treatment well    Behavior During Therapy WFL for tasks assessed/performed                   Past Medical History:  Diagnosis Date   Anemia    Aortic atherosclerosis (HCC)    Arthritis    Cardiomegaly    Cardiomyopathy (HCC)    Cholelithiasis    Chronic kidney disease, stage 2 (mild)    Chronic sinus bradycardia    Coronary artery disease 03/10/2012   a.) LHC 03/10/2012: 40% mLAD, 50% pRI, 50% pRCA, 90% mRCA, 50% dRCA, 50% RPDA - unable to cannulate RCA - med mgmt; b.) LHC/PCI 06/25/2013: 30% pLAD, 40% mLAD, 20% mLCx, 30% RI, 30% pRCA, 70% mRCA (2.5 mm Promus DES), 40% dRCA-1, 90% dRCA-2 (2.5 mm Promus DES)   DDD (degenerative disc disease), thoracolumbar    Diverticulosis    Dizziness    Environmental allergies    Full dentures    GERD (gastroesophageal reflux disease) 06/25/2013   H/O bilateral cataract extraction 2019   History of diverticulosis    History of kidney stones    HLD (hyperlipidemia)    Hyperlipidemia, unspecified    Hypertension    Nephrolithiasis    Osteoporosis, post-menopausal    Parkinson disease    Pre-diabetes    PVC's (premature ventricular contractions)    Scoliosis    Thoracic kyphosis    Trifascicular bundle branch block    a.) 1st degree AVB + RBBB + LAFB   Vascular disease    Past Surgical History:  Procedure Laterality Date   APPENDECTOMY     BREAST EXCISIONAL  BIOPSY Left 1984   neg   BREAST EXCISIONAL BIOPSY Right 1997   lipoma   CATARACT EXTRACTION W/PHACO Left 09/02/2017   Procedure: CATARACT EXTRACTION PHACO AND INTRAOCULAR LENS PLACEMENT (IOC)  LEFT;  Surgeon: Lockie Mola, MD;  Location: [facility];  Service: Ophthalmology;  Laterality: Left;   CATARACT EXTRACTION W/PHACO Right 10/01/2017   Procedure: CATARACT EXTRACTION PHACO AND INTRAOCULAR LENS PLACEMENT (IOC) RIGHT;  Surgeon: Lockie Mola, MD;  Location: [facility];  Service: Ophthalmology;  Laterality: Right;   COLONOSCOPY WITH PROPOFOL N/A 11/27/2016   Procedure: COLONOSCOPY WITH PROPOFOL;  Surgeon: Toledo, Boykin Nearing, MD;  Location: [facility];  Service: Gastroenterology;  Laterality: N/A;   CORONARY ANGIOPLASTY WITH STENT PLACEMENT Left 06/25/2013   Procedure: CORONARY ANGIOPLASTY WITH STENT PLACEMENT; Location: [facility]; Surgeon: Rolly Salter, MD   KNEE ARTHROPLASTY Left 01/22/2017   Procedure: COMPUTER ASSISTED TOTAL KNEE ARTHROPLASTY;  Surgeon: Donato Heinz, MD;  Location: [facility];  Service: Orthopedics;  Laterality: Left;   KNEE ARTHROSCOPY Left 02/01/2015   Procedure: ARTHROSCOPY KNEE, partial medial & lateral menisectomy,,medial and patelofemoral chondroplasty;  Surgeon: Donato Heinz, MD;  Location: [facility];  Service: Orthopedics;  Laterality: Left;   LEFT HEART CATH AND CORONARY ANGIOGRAPHY Left 03/10/2012   Procedure: LEFT HEART CATH AND CORONARY ANGIOGRAPHY; Location: [facility]; Surgeon: Marcina Millard, MD   LITHOTRIPSY Right 1995   RIGHT AXILLARY LIPOMA REMOVAL     TUBAL LIGATION     VARICOSE VEIN SURGERY     Patient Active Problem List   Diagnosis Date Noted   Primary osteoarthritis of hip 02/09/2017   Primary osteoarthritis of knee 02/09/2017   Chronic sinus bradycardia 01/22/2017   Degenerative disc disease, lumbar 01/22/2017   Dizziness 01/22/2017   Nephrolithiasis 01/22/2017   Osteoporosis, 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/22/2017   PVC's  (premature ventricular contractions) 01/22/2017   S/P total knee arthroplasty 01/22/2017   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   CKD (chronic kidney disease), 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   GERD (gastroesophageal reflux disease) 06/25/2013   Coronary artery disease involving native coronary artery of native heart 06/24/2013   Essential hypertension 06/21/2013   Mixed hyperlipidemia 06/21/2013    ONSET DATE: March 2019  REFERRING DIAG: Parkinson's Disease  THERAPY DIAG:  Parkinson's disease, unspecified whether dyskinesia present, unspecified whether manifestations fluctuate  Muscle weakness (generalized)  Other abnormalities of gait and mobility  Unsteadiness on feet  Rationale for Evaluation and Treatment: Rehabilitation  SUBJECTIVE:                                                                                                                                                                                             SUBJECTIVE STATEMENT: Pt reports doing well without any new complaints. Pt accompanied by: self  PERTINENT HISTORY: Patient reporting to physical therapy for general unsteadiness and weakness as it relates to Parkinson's disease. Has been seen in the past for PT in 2019. PMH includes: Parkinson's disease, Vascular disease, Dizziness, DDD, GERD, arthritis, CKD, CAD. Reports no falls in the past 6 months. Is currently medicated for Parkinson's; taking medication 4x a day. She notices her biggest struggle is doing activities for longer durations of time. She also has concerns of feeling off balance when reacting quickly to environment.   PAIN:  Are you having pain? No  PRECAUTIONS: None  WEIGHT BEARING RESTRICTIONS: No  FALLS: Has patient fallen in last 6 months? No  LIVING ENVIRONMENT: Lives with: lives alone and daughter lives next door. Lives in: House/apartment Stairs: Yes: External: 4 steps; on right going up and on left going up Has following equipment  at home:  None, but has use cane in the past. Has shower rails, shower chair; doesn't use  PLOF: Independent with basic ADLs, Independent with household mobility without device, Independent with gait, and Independent  with transfers  PATIENT GOALS: Get stronger, be able to stand long enough to cook lunch/ meals  OBJECTIVE:   DIAGNOSTIC FINDINGS:   MRI THORACIC SPINE WITHOUT CONTRAST: IMPRESSION: 1. Scoliosis of the thoracic spine is convex to the left at T3. 2. Exaggerated thoracic kyphosis is stable 3. Mild right foraminal narrowing at T2-3 and T3-4. 4. Mild disc bulging and facet hypertrophy at T6-7, T7-8, T10-11, and T11-12 without significant stenosis. 5. Chronic fatty endplate marrow changes across the endplates T4-5 through T11-12 with the exception of T6-7 and T7-8 which changes are more edematous.  CHEST - 2 VIEW :IMPRESSION: No active cardiopulmonary disease.  COGNITION: Overall cognitive status: Within functional limits for tasks assessed    POSTURE: rounded shoulders, forward head, and increased thoracic kyphosis    LOWER EXTREMITY MMT:    MMT Right Eval Left Eval  Hip flexion 4- 4  Hip extension    Hip abduction 4 4  Hip adduction 4- 4-  Hip internal rotation    Hip external rotation    Knee flexion 4- 4-  Knee extension 4- 4  Ankle dorsiflexion 4- 4-  Ankle plantarflexion    Ankle inversion    Ankle eversion    (Blank rows = not tested)   GAIT: Gait pattern:  Pt ability to walk in straight line diminished as progressed, primarily drifting to R side,  and decreased stride length Distance walked: 937 ft Assistive device utilized: None Level of assistance: CGA Comments: Pt walking at slow pace, though able to continuously ambulate throughout the 6 mins.  FUNCTIONAL TESTS:  5 times sit to stand: 23.99 sec Timed up and go (TUG): 14.38 sec; 2nd attempt, needed additional cue for correct completion of the test. 6 minute walk test: 937 ft, see  additional comment per Gait assessment above. 10 meter walk test: 11.32 secs; 0.88 m/s Berg Balance Scale: 40, see details below, assessed 2nd session     PATIENT SURVEYS:  FOTO 53  TODAY'S TREATMENT:                                                                                                                              DATE: 09/09/22     NMR:  Seated thoracic rotation (open book- closed book to left then to right) x 12 reps Sit<>stand without UE support x 5 with emphasis in increased speed Sit to stand with ball toss off wall over white board 2x 6  Overhead shoulder diagonal raises- x 10 reps  Squat with shoulder abduction x 10 with min assist from PT to expand ROM.   Gait without resistance 2 x 134ft. Gait with 3# ankle weights 3 x 119ft. Throughout gait training PT provided cues for change in gait speed to force increased step length and large amplituded movement in functional context.   Throughout session PT provided supervision assist-CGA with dynamic movement and cues for improved posture and forward gaze with gait.   Pt required  multiple seated rest breaks due to fatigue and pt rated difficulty of each activity as moderate.   PATIENT EDUCATION: Education details: Educated on instruction for tests and measures, outcome measures, and POC. Pt educated throughout session about proper posture and technique with exercises. Improved exercise technique, movement at target joints, use of target muscles after min to mod verbal, visual, tactile cues.  Person educated: Patient Education method: Medical illustrator Education comprehension: verbalized understanding  HOME EXERCISE PROGRAM: Access Code: [facility] [email or web address]  Date: 07/17/2022 Prepared by: Grier Rocher Exercises - Standing Single Leg  Stance with Counter Support  - 1 x daily - 7 x weekly - 3 sets - 10 reps - 2 hold -  Seated Hip Abduction with Resistance  - 1 x daily - 7 x weekly  - 3 sets - 10 reps -  Seated Long Arc Quad  - 1 x daily - 7 x weekly - 3 sets - 10 reps - 3 hold   Access Code: [email or web address] URL: [email or web address] Date: 08/19/2022 Prepared by: Grier Rocher  Exercises - Sit to Stand with Armchair  - 1 x daily - 7 x weekly - 3 sets - 8 reps - Side Stepping with Counter Support  - 1 x daily - 7 x weekly - 3 sets - 8 reps  GOALS: Goals reviewed with patient? No  SHORT TERM GOALS: Target date: 08/12/2022    Patient will be independent in home exercise program to improve strength/mobility for better functional independence with ADLs. Baseline: provided  Goal status:  Progressing   LONG TERM GOALS: Target date: 10/07/2022   1.  Patient (> 50 years old) will complete five times sit to stand test in < 15 seconds indicating an increased LE strength and improved balance. Baseline: 23.99 sec 8/6 Goal status: 14.81 MET  2.  Patient will increase FOTO score to equal to or greater than  59  to demonstrate statistically significant improvement in mobility and quality of life.  Baseline: 53 8/6 59 Goal status:MET 3.  Patient will increase Berg Balance score by > 6 points to demonstrate decreased fall risk during functional activities. Baseline: Assessed 2nd session: 40 Goal status: INITIAL   4.   Patient will reduce timed up and go to <11 seconds to reduce fall risk and demonstrate improved transfer/gait ability. Baseline: 14.38 8/6: 11.97 Goal status:progressing   5.   Patient will increase 10 meter walk test to >1.44m/s as to improve gait speed for better community ambulation and to reduce fall risk. Baseline: 0.88 m/s 8/6: 9.34, 9.89 (1.44m/s) Goal status: Met  6.   Patient will increase six minute walk test distance to >1000 for progression to community ambulator and improve gait ability Baseline: 937 feet Goal status: INITIAL     ASSESSMENT:  CLINICAL IMPRESSION:  Patient presents with good motivation for today's  session. She performed well. PT increased weight of resisted gait and encouraged increased amplitude of movement to allow improved step length, gait speed and ROm in BUE as needed. Able to demonstrate increased ROM and speed of movement with increased repetitions.  Pt will continue to benefit from skilled physical therapy intervention to address impairments, improve QOL, and attain therapy goals.   OBJECTIVE IMPAIRMENTS: decreased balance, decreased endurance, decreased strength, and Abnormal Gait; R side drift .   ACTIVITY LIMITATIONS: bending, standing, and locomotion level  PARTICIPATION LIMITATIONS: meal prep, cleaning, and community activity  PERSONAL FACTORS: Age, Fitness, and Past/current experiences are also affecting patient's functional outcome.   REHAB POTENTIAL:  Good  CLINICAL DECISION MAKING: Stable/uncomplicated  EVALUATION COMPLEXITY: Low  PLAN:  PT FREQUENCY: 1-2x/week  PT DURATION: 12 weeks  PLANNED INTERVENTIONS: Therapeutic exercises, Therapeutic activity, Neuromuscular re-education, Balance training, Gait training, Patient/Family education, Self Care, Joint mobilization, Vestibular training, Cryotherapy, Moist heat, and Re-evaluation  PLAN FOR NEXT SESSION:   Revise HEP and continue large amplitude movement and balance training.    Golden Pop, PT 09/09/2022, 9:05 AM    9:05 AM 09/09/22

## 2022-09-10 ENCOUNTER — Other Ambulatory Visit: Payer: Self-pay | Admitting: Internal Medicine

## 2022-09-10 DIAGNOSIS — Z1231 Encounter for screening mammogram for malignant neoplasm of breast: Secondary | ICD-10-CM

## 2022-09-11 ENCOUNTER — Ambulatory Visit: Payer: Medicare Other

## 2022-09-11 ENCOUNTER — Ambulatory Visit: Payer: Medicare Other | Admitting: Physical Therapy

## 2022-09-16 ENCOUNTER — Ambulatory Visit: Payer: Medicare Other | Admitting: Physical Therapy

## 2022-09-18 ENCOUNTER — Ambulatory Visit: Payer: Medicare Other | Admitting: Physical Therapy

## 2022-09-24 ENCOUNTER — Ambulatory Visit: Payer: Medicare Other | Attending: Neurology | Admitting: Physical Therapy

## 2022-09-24 DIAGNOSIS — R2681 Unsteadiness on feet: Secondary | ICD-10-CM | POA: Diagnosis present

## 2022-09-24 DIAGNOSIS — R2689 Other abnormalities of gait and mobility: Secondary | ICD-10-CM | POA: Insufficient documentation

## 2022-09-24 DIAGNOSIS — R471 Dysarthria and anarthria: Secondary | ICD-10-CM | POA: Insufficient documentation

## 2022-09-24 DIAGNOSIS — M6281 Muscle weakness (generalized): Secondary | ICD-10-CM | POA: Insufficient documentation

## 2022-09-24 DIAGNOSIS — G20A1 Parkinson's disease without dyskinesia, without mention of fluctuations: Secondary | ICD-10-CM | POA: Diagnosis present

## 2022-09-24 NOTE — Therapy (Signed)
OUTPATIENT PHYSICAL THERAPY NEURO TREATMENT NOTE    Patient Name: Katelyn Crawford MRN: [id number] DOB:1940-02-02, 82 y.o., female Today's Date: 09/24/2022   PCP:   Marguarite Arbour, MD   REFERRING PROVIDER: Lonell Face, MD   END OF SESSION:  PT End of Session - 09/24/22 1101     Visit Number 13    Number of Visits 24    Date for PT Re-Evaluation 10/07/22    Progress Note Due on Visit 20    PT Start Time 1103    PT Stop Time 1145    PT Time Calculation (min) 42 min    Equipment Utilized During Treatment Gait belt    Activity Tolerance Patient tolerated treatment well    Behavior During Therapy WFL for tasks assessed/performed                   Past Medical History:  Diagnosis Date   Anemia    Aortic atherosclerosis (HCC)    Arthritis    Cardiomegaly    Cardiomyopathy (HCC)    Cholelithiasis    Chronic kidney disease, stage 2 (mild)    Chronic sinus bradycardia    Coronary artery disease 03/10/2012   a.) LHC 03/10/2012: 40% mLAD, 50% pRI, 50% pRCA, 90% mRCA, 50% dRCA, 50% RPDA - unable to cannulate RCA - med mgmt; b.) LHC/PCI 06/25/2013: 30% pLAD, 40% mLAD, 20% mLCx, 30% RI, 30% pRCA, 70% mRCA (2.5 mm Promus DES), 40% dRCA-1, 90% dRCA-2 (2.5 mm Promus DES)   DDD (degenerative disc disease), thoracolumbar    Diverticulosis    Dizziness    Environmental allergies    Full dentures    GERD (gastroesophageal reflux disease) 06/25/2013   H/O bilateral cataract extraction 2019   History of diverticulosis    History of kidney stones    HLD (hyperlipidemia)    Hyperlipidemia, unspecified    Hypertension    Nephrolithiasis    Osteoporosis, post-menopausal    Parkinson disease    Pre-diabetes    PVC's (premature ventricular contractions)    Scoliosis    Thoracic kyphosis    Trifascicular bundle branch block    a.) 1st degree AVB + RBBB + LAFB   Vascular disease    Past Surgical History:  Procedure Laterality Date   APPENDECTOMY     BREAST EXCISIONAL  BIOPSY Left 1984   neg   BREAST EXCISIONAL BIOPSY Right 1997   lipoma   CATARACT EXTRACTION W/PHACO Left 09/02/2017   Procedure: CATARACT EXTRACTION PHACO AND INTRAOCULAR LENS PLACEMENT (IOC)  LEFT;  Surgeon: Lockie Mola, MD;  Location: [facility];  Service: Ophthalmology;  Laterality: Left;   CATARACT EXTRACTION W/PHACO Right 10/01/2017   Procedure: CATARACT EXTRACTION PHACO AND INTRAOCULAR LENS PLACEMENT (IOC) RIGHT;  Surgeon: Lockie Mola, MD;  Location: [facility];  Service: Ophthalmology;  Laterality: Right;   COLONOSCOPY WITH PROPOFOL N/A 11/27/2016   Procedure: COLONOSCOPY WITH PROPOFOL;  Surgeon: Toledo, Boykin Nearing, MD;  Location: [facility];  Service: Gastroenterology;  Laterality: N/A;   CORONARY ANGIOPLASTY WITH STENT PLACEMENT Left 06/25/2013   Procedure: CORONARY ANGIOPLASTY WITH STENT PLACEMENT; Location: [facility]; Surgeon: Rolly Salter, MD   KNEE ARTHROPLASTY Left 01/22/2017   Procedure: COMPUTER ASSISTED TOTAL KNEE ARTHROPLASTY;  Surgeon: Donato Heinz, MD;  Location: [facility];  Service: Orthopedics;  Laterality: Left;   KNEE ARTHROSCOPY Left 02/01/2015   Procedure: ARTHROSCOPY KNEE, partial medial & lateral menisectomy,,medial and patelofemoral chondroplasty;  Surgeon: Donato Heinz, MD;  Location: [facility];  Service: Orthopedics;  Laterality: Left;   LEFT HEART CATH AND CORONARY ANGIOGRAPHY Left 03/10/2012   Procedure: LEFT HEART CATH AND CORONARY ANGIOGRAPHY; Location: [facility]; Surgeon: Marcina Millard, MD   LITHOTRIPSY Right 1995   RIGHT AXILLARY LIPOMA REMOVAL     TUBAL LIGATION     VARICOSE VEIN SURGERY     Patient Active Problem List   Diagnosis Date Noted   Primary osteoarthritis of hip 02/09/2017   Primary osteoarthritis of knee 02/09/2017   Chronic sinus bradycardia 01/22/2017   Degenerative disc disease, lumbar 01/22/2017   Dizziness 01/22/2017   Nephrolithiasis 01/22/2017   Osteoporosis, 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/22/2017   PVC's  (premature ventricular contractions) 01/22/2017   S/P total knee arthroplasty 01/22/2017   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   CKD (chronic kidney disease), 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   GERD (gastroesophageal reflux disease) 06/25/2013   Coronary artery disease involving native coronary artery of native heart 06/24/2013   Essential hypertension 06/21/2013   Mixed hyperlipidemia 06/21/2013    ONSET DATE: March 2019  REFERRING DIAG: Parkinson's Disease  THERAPY DIAG:  Parkinson's disease, unspecified whether dyskinesia present, unspecified whether manifestations fluctuate  Muscle weakness (generalized)  Other abnormalities of gait and mobility  Unsteadiness on feet  Rationale for Evaluation and Treatment: Rehabilitation  SUBJECTIVE:                                                                                                                                                                                             SUBJECTIVE STATEMENT: Pt reports doing okay. Missed PT sessions last week with elevated BP and "not feeling well." States that she was seen by cardiology and PCP last week. Slight adjustment to HR and BP medications. No other issues   Pt accompanied by: self  PERTINENT HISTORY: Patient reporting to physical therapy for general unsteadiness and weakness as it relates to Parkinson's disease. Has been seen in the past for PT in 2019. PMH includes: Parkinson's disease, Vascular disease, Dizziness, DDD, GERD, arthritis, CKD, CAD. Reports no falls in the past 6 months. Is currently medicated for Parkinson's; taking medication 4x a day. She notices her biggest struggle is doing activities for longer durations of time. She also has concerns of feeling off balance when reacting quickly to environment.   PAIN:  Are you having pain? No  PRECAUTIONS: None  WEIGHT BEARING RESTRICTIONS: No  FALLS: Has patient fallen in last 6 months? No  LIVING ENVIRONMENT: Lives with:  lives alone and daughter lives next door. Lives in: House/apartment Stairs: Yes: External: 4 steps; on right going up and on left going up Has following equipment at home:  None,  but has use cane in the past. Has shower rails, shower chair; doesn't use  PLOF: Independent with basic ADLs, Independent with household mobility without device, Independent with gait, and Independent with transfers  PATIENT GOALS: Get stronger, be able to stand long enough to cook lunch/ meals  OBJECTIVE:   DIAGNOSTIC FINDINGS:   MRI THORACIC SPINE WITHOUT CONTRAST: IMPRESSION: 1. Scoliosis of the thoracic spine is convex to the left at T3. 2. Exaggerated thoracic kyphosis is stable 3. Mild right foraminal narrowing at T2-3 and T3-4. 4. Mild disc bulging and facet hypertrophy at T6-7, T7-8, T10-11, and T11-12 without significant stenosis. 5. Chronic fatty endplate marrow changes across the endplates T4-5 through T11-12 with the exception of T6-7 and T7-8 which changes are more edematous.  CHEST - 2 VIEW :IMPRESSION: No active cardiopulmonary disease.  COGNITION: Overall cognitive status: Within functional limits for tasks assessed    POSTURE: rounded shoulders, forward head, and increased thoracic kyphosis    LOWER EXTREMITY MMT:    MMT Right Eval Left Eval  Hip flexion 4- 4  Hip extension    Hip abduction 4 4  Hip adduction 4- 4-  Hip internal rotation    Hip external rotation    Knee flexion 4- 4-  Knee extension 4- 4  Ankle dorsiflexion 4- 4-  Ankle plantarflexion    Ankle inversion    Ankle eversion    (Blank rows = not tested)   GAIT: Gait pattern:  Pt ability to walk in straight line diminished as progressed, primarily drifting to R side,  and decreased stride length Distance walked: 937 ft Assistive device utilized: None Level of assistance: CGA Comments: Pt walking at slow pace, though able to continuously ambulate throughout the 6 mins.  FUNCTIONAL TESTS:  5 times  sit to stand: 23.99 sec Timed up and go (TUG): 14.38 sec; 2nd attempt, needed additional cue for correct completion of the test. 6 minute walk test: 937 ft, see additional comment per Gait assessment above. 10 meter walk test: 11.32 secs; 0.88 m/s Berg Balance Scale: 40, see details below, assessed 2nd session     PATIENT SURVEYS:  FOTO 53  TODAY'S TREATMENT:                                                                                                                              DATE: 09/24/22  Nustep BLE/BLE reciprocal movement and endurance training x 5 min , level 0-3 with last min at 0 resistance for cool down.  VS assessment  Following nustep 147/65 HR 70 With 3 min rest break 137/66 HR 66  -Sit<>stand with shoulder abduction/scapular retraction x 10  -Forward step with ipsilateral UE swing forward and contralateral UE back swing x 8 bil  -lateral step to target with ipsilateral UE shoulder abduction   Weighted gait training with 3# ankle weights 2 x 151ft with cues for improved UE swing    Standing at rail with 3# ankle  weight  Reciprocal march x 12 bil  Hip abduction x 12 Bil  HS curl x 10 bil   Throughout session PT provided supervision assist-CGA with dynamic movement and cues for improved posture and forward gaze with gait.   Pt required multiple seated rest breaks due to fatigue and pt rated difficulty of each activity as moderate.   PATIENT EDUCATION: Education details: Educated on instruction for tests and measures, outcome measures, and POC. Pt educated throughout session about proper posture and technique with exercises. Improved exercise technique, movement at target joints, use of target muscles after min to mod verbal, visual, tactile cues.  Person educated: Patient Education method: Medical illustrator Education comprehension: verbalized understanding  HOME EXERCISE PROGRAM: Access Code: [facility] [email or web address]   Date: 07/17/2022 Prepared by: Grier Rocher Exercises - Standing Single Leg  Stance with Counter Support  - 1 x daily - 7 x weekly - 3 sets - 10 reps - 2 hold -  Seated Hip Abduction with Resistance  - 1 x daily - 7 x weekly - 3 sets - 10 reps -  Seated Long Arc Quad  - 1 x daily - 7 x weekly - 3 sets - 10 reps - 3 hold   Access Code: [email or web address] URL: [email or web address] Date: 08/19/2022 Prepared by: Grier Rocher  Exercises - Sit to Stand with Armchair  - 1 x daily - 7 x weekly - 3 sets - 8 reps - Side Stepping with Counter Support  - 1 x daily - 7 x weekly - 3 sets - 8 reps  GOALS: Goals reviewed with patient? No  SHORT TERM GOALS: Target date: 08/12/2022    Patient Crawford be independent in home exercise program to improve strength/mobility for better functional independence with ADLs. Baseline: provided  Goal status:  Progressing   LONG TERM GOALS: Target date: 10/07/2022   1.  Patient (> 37 years old) Crawford complete five times sit to stand test in < 15 seconds indicating an increased LE strength and improved balance. Baseline: 23.99 sec 8/6 Goal status: 14.81 MET  2.  Patient Crawford increase FOTO score to equal to or greater than  59  to demonstrate statistically significant improvement in mobility and quality of life.  Baseline: 53 8/6 59 Goal status:MET 3.  Patient Crawford increase Berg Balance score by > 6 points to demonstrate decreased fall risk during functional activities. Baseline: Assessed 2nd session: 40 Goal status: INITIAL   4.   Patient Crawford reduce timed up and go to <11 seconds to reduce fall risk and demonstrate improved transfer/gait ability. Baseline: 14.38 8/6: 11.97 Goal status:progressing   5.   Patient Crawford increase 10 meter walk test to >1.88m/s as to improve gait speed for better community ambulation and to reduce fall risk. Baseline: 0.88 m/s 8/6: 9.34, 9.89 (1.73m/s) Goal status: Met  6.   Patient Crawford increase six minute walk  test distance to >1000 for progression to community ambulator and improve gait ability Baseline: 937 feet Goal status: INITIAL     ASSESSMENT:  CLINICAL IMPRESSION:  Patient presents with good motivation for today's session. Reports that she is feeling better and BP has reduced. Continues to demonstrate mild brady kinesia and decreased arm swing. Noted to have improved step length throughout session. Pt Crawford continue to benefit from skilled physical therapy intervention to address impairments, improve QOL, and attain therapy goals.   OBJECTIVE IMPAIRMENTS: decreased balance, decreased endurance, decreased strength, and Abnormal Gait; R side drift .   ACTIVITY LIMITATIONS: bending,  standing, and locomotion level  PARTICIPATION LIMITATIONS: meal prep, cleaning, and community activity  PERSONAL FACTORS: Age, Fitness, and Past/current experiences are also affecting patient's functional outcome.   REHAB POTENTIAL: Good  CLINICAL DECISION MAKING: Stable/uncomplicated  EVALUATION COMPLEXITY: Low  PLAN:  PT FREQUENCY: 1-2x/week  PT DURATION: 12 weeks  PLANNED INTERVENTIONS: Therapeutic exercises, Therapeutic activity, Neuromuscular re-education, Balance training, Gait training, Patient/Family education, Self Care, Joint mobilization, Vestibular training, Cryotherapy, Moist heat, and Re-evaluation  PLAN FOR NEXT SESSION:   Revise HEP and continue large amplitude movement and balance training.    Golden Pop, PT 09/24/2022, 11:02 AM    11:02 AM 09/24/22

## 2022-09-26 ENCOUNTER — Ambulatory Visit: Payer: Medicare Other | Admitting: Physical Therapy

## 2022-09-26 DIAGNOSIS — M6281 Muscle weakness (generalized): Secondary | ICD-10-CM

## 2022-09-26 DIAGNOSIS — R2689 Other abnormalities of gait and mobility: Secondary | ICD-10-CM

## 2022-09-26 DIAGNOSIS — R2681 Unsteadiness on feet: Secondary | ICD-10-CM

## 2022-09-26 DIAGNOSIS — G20A1 Parkinson's disease without dyskinesia, without mention of fluctuations: Secondary | ICD-10-CM | POA: Diagnosis not present

## 2022-09-26 NOTE — Therapy (Signed)
OUTPATIENT PHYSICAL THERAPY NEURO TREATMENT NOTE    Patient Name: Katelyn Crawford MRN: [id number] DOB:[birth year], 82 y.o., female Today's Date: 09/26/2022   PCP:   Marguarite Arbour, MD   REFERRING PROVIDER: Lonell Face, MD   END OF SESSION:  PT End of Session - 09/26/22 1021     Visit Number 14    Number of Visits 24    Date for PT Re-Evaluation 10/07/22    Progress Note Due on Visit 20    PT Start Time 1020    PT Stop Time 1100    PT Time Calculation (min) 40 min    Equipment Utilized During Treatment Gait belt    Activity Tolerance Patient tolerated treatment well    Behavior During Therapy WFL for tasks assessed/performed                   Past Medical History:  Diagnosis Date   Anemia    Aortic atherosclerosis (HCC)    Arthritis    Cardiomegaly    Cardiomyopathy (HCC)    Cholelithiasis    Chronic kidney disease, stage 2 (mild)    Chronic sinus bradycardia    Coronary artery disease 03/10/2012   a.) LHC 03/10/2012: 40% mLAD, 50% pRI, 50% pRCA, 90% mRCA, 50% dRCA, 50% RPDA - unable to cannulate RCA - med mgmt; b.) LHC/PCI 06/25/2013: 30% pLAD, 40% mLAD, 20% mLCx, 30% RI, 30% pRCA, 70% mRCA (2.5 mm Promus DES), 40% dRCA-1, 90% dRCA-2 (2.5 mm Promus DES)   DDD (degenerative disc disease), thoracolumbar    Diverticulosis    Dizziness    Environmental allergies    Full dentures    GERD (gastroesophageal reflux disease) 06/25/2013   H/O bilateral cataract extraction 2019   History of diverticulosis    History of kidney stones    HLD (hyperlipidemia)    Hyperlipidemia, unspecified    Hypertension    Nephrolithiasis    Osteoporosis, post-menopausal    Parkinson disease    Pre-diabetes    PVC's (premature ventricular contractions)    Scoliosis    Thoracic kyphosis    Trifascicular bundle branch block    a.) 1st degree AVB + RBBB + LAFB   Vascular disease    Past Surgical History:  Procedure Laterality Date   APPENDECTOMY     BREAST EXCISIONAL  BIOPSY Left 1984   neg   BREAST EXCISIONAL BIOPSY Right 1997   lipoma   CATARACT EXTRACTION W/PHACO Left 09/02/2017   Procedure: CATARACT EXTRACTION PHACO AND INTRAOCULAR LENS PLACEMENT (IOC)  LEFT;  Surgeon: Lockie Mola, MD;  Location: [facility];  Service: Ophthalmology;  Laterality: Left;   CATARACT EXTRACTION W/PHACO Right 10/01/2017   Procedure: CATARACT EXTRACTION PHACO AND INTRAOCULAR LENS PLACEMENT (IOC) RIGHT;  Surgeon: Lockie Mola, MD;  Location: [facility];  Service: Ophthalmology;  Laterality: Right;   COLONOSCOPY WITH PROPOFOL N/A 11/27/2016   Procedure: COLONOSCOPY WITH PROPOFOL;  Surgeon: Toledo, Boykin Nearing, MD;  Location: [facility];  Service: Gastroenterology;  Laterality: N/A;   CORONARY ANGIOPLASTY WITH STENT PLACEMENT Left 06/25/2013   Procedure: CORONARY ANGIOPLASTY WITH STENT PLACEMENT; Location: [facility]; Surgeon: Rolly Salter, MD   KNEE ARTHROPLASTY Left 01/22/2017   Procedure: COMPUTER ASSISTED TOTAL KNEE ARTHROPLASTY;  Surgeon: Donato Heinz, MD;  Location: [facility];  Service: Orthopedics;  Laterality: Left;   KNEE ARTHROSCOPY Left 02/01/2015   Procedure: ARTHROSCOPY KNEE, partial medial & lateral menisectomy,,medial and patelofemoral chondroplasty;  Surgeon: Donato Heinz, MD;  Location: [facility];  Service: Orthopedics;  Laterality: Left;   LEFT HEART CATH AND CORONARY ANGIOGRAPHY Left 03/10/2012   Procedure: LEFT HEART CATH AND CORONARY ANGIOGRAPHY; Location: [facility]; Surgeon: Marcina Millard, MD   LITHOTRIPSY Right 1995   RIGHT AXILLARY LIPOMA REMOVAL     TUBAL LIGATION     VARICOSE VEIN SURGERY     Patient Active Problem List   Diagnosis Date Noted   Primary osteoarthritis of hip 02/09/2017   Primary osteoarthritis of knee 02/09/2017   Chronic sinus bradycardia 01/22/2017   Degenerative disc disease, lumbar 01/22/2017   Dizziness 01/22/2017   Nephrolithiasis 01/22/2017   Osteoporosis, 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/22/2017   PVC's  (premature ventricular contractions) 01/22/2017   S/P total knee arthroplasty 01/22/2017   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   CKD (chronic kidney disease), 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   GERD (gastroesophageal reflux disease) 06/25/2013   Coronary artery disease involving native coronary artery of native heart 06/24/2013   Essential hypertension 06/21/2013   Mixed hyperlipidemia 06/21/2013    ONSET DATE: March 2019  REFERRING DIAG: Parkinson's Disease  THERAPY DIAG:  Parkinson's disease, unspecified whether dyskinesia present, unspecified whether manifestations fluctuate  Muscle weakness (generalized)  Other abnormalities of gait and mobility  Unsteadiness on feet  Rationale for Evaluation and Treatment: Rehabilitation  SUBJECTIVE:                                                                                                                                                                                             SUBJECTIVE STATEMENT: Pt reports doing okay. States that she is feeling weak this morning with burning feeling in the mid/upper back. States that back pain limited her from going to church yesterday, but went for a walk and felt a little better. rat  Pt accompanied by: self  PERTINENT HISTORY: Patient reporting to physical therapy for general unsteadiness and weakness as it relates to Parkinson's disease. Has been seen in the past for PT in 2019. PMH includes: Parkinson's disease, Vascular disease, Dizziness, DDD, GERD, arthritis, CKD, CAD. Reports no falls in the past 6 months. Is currently medicated for Parkinson's; taking medication 4x a day. She notices her biggest struggle is doing activities for longer durations of time. She also has concerns of feeling off balance when reacting quickly to environment.   PAIN:  Are you having pain? No and Yes: NPRS scale: 5/10 Pain location: back Pain description: burn Aggravating factors:   Relieving factors:     PRECAUTIONS: None  WEIGHT BEARING RESTRICTIONS: No  FALLS: Has patient fallen in last 6 months? No  LIVING ENVIRONMENT: Lives with: lives alone and daughter lives next door. Lives in:  House/apartment Stairs: Yes: External: 4 steps; on right going up and on left going up Has following equipment at home:  None, but has use cane in the past. Has shower rails, shower chair; doesn't use  PLOF: Independent with basic ADLs, Independent with household mobility without device, Independent with gait, and Independent with transfers  PATIENT GOALS: Get stronger, be able to stand long enough to cook lunch/ meals  OBJECTIVE:   DIAGNOSTIC FINDINGS:   MRI THORACIC SPINE WITHOUT CONTRAST: IMPRESSION: 1. Scoliosis of the thoracic spine is convex to the left at T3. 2. Exaggerated thoracic kyphosis is stable 3. Mild right foraminal narrowing at T2-3 and T3-4. 4. Mild disc bulging and facet hypertrophy at T6-7, T7-8, T10-11, and T11-12 without significant stenosis. 5. Chronic fatty endplate marrow changes across the endplates T4-5 through T11-12 with the exception of T6-7 and T7-8 which changes are more edematous.  CHEST - 2 VIEW :IMPRESSION: No active cardiopulmonary disease.  COGNITION: Overall cognitive status: Within functional limits for tasks assessed    POSTURE: rounded shoulders, forward head, and increased thoracic kyphosis    LOWER EXTREMITY MMT:    MMT Right Eval Left Eval  Hip flexion 4- 4  Hip extension    Hip abduction 4 4  Hip adduction 4- 4-  Hip internal rotation    Hip external rotation    Knee flexion 4- 4-  Knee extension 4- 4  Ankle dorsiflexion 4- 4-  Ankle plantarflexion    Ankle inversion    Ankle eversion    (Blank rows = not tested)   GAIT: Gait pattern:  Pt ability to walk in straight line diminished as progressed, primarily drifting to R side,  and decreased stride length Distance walked: 937 ft Assistive device utilized: None Level of  assistance: CGA Comments: Pt walking at slow pace, though able to continuously ambulate throughout the 6 mins.  FUNCTIONAL TESTS:  5 times sit to stand: 23.99 sec Timed up and go (TUG): 14.38 sec; 2nd attempt, needed additional cue for correct completion of the test. 6 minute walk test: 937 ft, see additional comment per Gait assessment above. 10 meter walk test: 11.32 secs; 0.88 m/s Berg Balance Scale: 40, see details below, assessed 2nd session     PATIENT SURVEYS:  FOTO 53  TODAY'S TREATMENT:                                                                                                                              DATE: 09/26/22  BP taken at start of treatment session.  Sitting:  139/67 HR 65 Standing: 130/70: HR 75  Gait through rehab gym without AD to restroom. Pt able void without assist from PT.  Gait training through simulated community environment through hall of hospital, cafeteria and over cemented sidwalk and ramp to and from healing garden outside cancer center.   Sitting low row with emphasis on scapular retraction  PWR up from sitting 2 x 8 with  3 sec hold  Sitting trunk extension 2 x 10    Throughout session PT provided supervision assist-CGA with dynamic movement and cues for improved posture and BUE swing.   Pt required multiple seated rest breaks due to fatigue and pt rated difficulty of each activity as moderate.   Reduced pain in mid/upper back at end of session to 3/10   PATIENT EDUCATION: Education details: Educated on instruction for tests and measures, outcome measures, and POC. Pt educated throughout session about proper posture and technique with exercises. Improved exercise technique, movement at target joints, use of target muscles after min to mod verbal, visual, tactile cues.  Person educated: Patient Education method: Medical illustrator Education comprehension: verbalized understanding  HOME EXERCISE PROGRAM: Access Code:  [facility] [email or web address]  Date: 07/17/2022 Prepared by: Grier Rocher Exercises - Standing Single Leg  Stance with Counter Support  - 1 x daily - 7 x weekly - 3 sets - 10 reps - 2 hold -  Seated Hip Abduction with Resistance  - 1 x daily - 7 x weekly - 3 sets - 10 reps -  Seated Long Arc Quad  - 1 x daily - 7 x weekly - 3 sets - 10 reps - 3 hold   Access Code: [email or web address] URL: [email or web address] Date: 08/19/2022 Prepared by: Grier Rocher  Exercises - Sit to Stand with Armchair  - 1 x daily - 7 x weekly - 3 sets - 8 reps - Side Stepping with Counter Support  - 1 x daily - 7 x weekly - 3 sets - 8 reps  GOALS: Goals reviewed with patient? No  SHORT TERM GOALS: Target date: 08/12/2022    Patient will be independent in home exercise program to improve strength/mobility for better functional independence with ADLs. Baseline: provided  Goal status:  Progressing   LONG TERM GOALS: Target date: 10/07/2022   1.  Patient (> 81 years old) will complete five times sit to stand test in < 15 seconds indicating an increased LE strength and improved balance. Baseline: 23.99 sec 8/6 Goal status: 14.81 MET  2.  Patient will increase FOTO score to equal to or greater than  59  to demonstrate statistically significant improvement in mobility and quality of life.  Baseline: 53 8/6 59 Goal status:MET 3.  Patient will increase Berg Balance score by > 6 points to demonstrate decreased fall risk during functional activities. Baseline: Assessed 2nd session: 40 Goal status: INITIAL   4.   Patient will reduce timed up and go to <11 seconds to reduce fall risk and demonstrate improved transfer/gait ability. Baseline: 14.38 8/6: 11.97 Goal status:progressing   5.   Patient will increase 10 meter walk test to >1.87m/s as to improve gait speed for better community ambulation and to reduce fall risk. Baseline: 0.88 m/s 8/6: 9.34, 9.89 (1.33m/s) Goal status:  Met  6.   Patient will increase six minute walk test distance to >1000 for progression to community ambulator and improve gait ability Baseline: 937 feet Goal status: INITIAL     ASSESSMENT:  CLINICAL IMPRESSION:  Patient presents with mild pain in the mid/upper back. PT treatment focused on posture and gait mechanics to increase parascapular movement and increased trnuk extension. At end of session, pt reports decreased pain in the mid back and was noted to have mild improvement erect posture. Pt will continue to benefit from skilled physical therapy intervention to address impairments, improve QOL, and attain therapy goals.   OBJECTIVE IMPAIRMENTS: decreased balance, decreased endurance, decreased  strength, and Abnormal Gait; R side drift .   ACTIVITY LIMITATIONS: bending, standing, and locomotion level  PARTICIPATION LIMITATIONS: meal prep, cleaning, and community activity  PERSONAL FACTORS: Age, Fitness, and Past/current experiences are also affecting patient's functional outcome.   REHAB POTENTIAL: Good  CLINICAL DECISION MAKING: Stable/uncomplicated  EVALUATION COMPLEXITY: Low  PLAN:  PT FREQUENCY: 1-2x/week  PT DURATION: 12 weeks  PLANNED INTERVENTIONS: Therapeutic exercises, Therapeutic activity, Neuromuscular re-education, Balance training, Gait training, Patient/Family education, Self Care, Joint mobilization, Vestibular training, Cryotherapy, Moist heat, and Re-evaluation  PLAN FOR NEXT SESSION:   Revise HEP with seated and standing PWR movement.   Golden Pop, PT 09/26/2022, 10:22 AM    10:22 AM 09/26/22

## 2022-10-01 ENCOUNTER — Ambulatory Visit: Payer: Medicare Other | Admitting: Physical Therapy

## 2022-10-01 DIAGNOSIS — R2689 Other abnormalities of gait and mobility: Secondary | ICD-10-CM

## 2022-10-01 DIAGNOSIS — G20A1 Parkinson's disease without dyskinesia, without mention of fluctuations: Secondary | ICD-10-CM

## 2022-10-01 DIAGNOSIS — R2681 Unsteadiness on feet: Secondary | ICD-10-CM

## 2022-10-01 DIAGNOSIS — R471 Dysarthria and anarthria: Secondary | ICD-10-CM

## 2022-10-01 DIAGNOSIS — M6281 Muscle weakness (generalized): Secondary | ICD-10-CM

## 2022-10-01 NOTE — Therapy (Signed)
OUTPATIENT PHYSICAL THERAPY NEURO TREATMENT NOTE    Patient Name: Katelyn Crawford MRN: [id number] DOB:1940-05-22, 82 y.o., female Today's Date: 10/01/2022   PCP:   Marguarite Arbour, MD   REFERRING PROVIDER: Lonell Face, MD   END OF SESSION:  PT End of Session - 10/01/22 0917     Visit Number 15    Number of Visits 24    Date for PT Re-Evaluation 10/07/22    Progress Note Due on Visit 20    PT Start Time 0925    PT Stop Time 1008    PT Time Calculation (min) 43 min    Equipment Utilized During Treatment Gait belt    Activity Tolerance Patient tolerated treatment well    Behavior During Therapy WFL for tasks assessed/performed                   Past Medical History:  Diagnosis Date   Anemia    Aortic atherosclerosis (HCC)    Arthritis    Cardiomegaly    Cardiomyopathy (HCC)    Cholelithiasis    Chronic kidney disease, stage 2 (mild)    Chronic sinus bradycardia    Coronary artery disease 03/10/2012   a.) LHC 03/10/2012: 40% mLAD, 50% pRI, 50% pRCA, 90% mRCA, 50% dRCA, 50% RPDA - unable to cannulate RCA - med mgmt; b.) LHC/PCI 06/25/2013: 30% pLAD, 40% mLAD, 20% mLCx, 30% RI, 30% pRCA, 70% mRCA (2.5 mm Promus DES), 40% dRCA-1, 90% dRCA-2 (2.5 mm Promus DES)   DDD (degenerative disc disease), thoracolumbar    Diverticulosis    Dizziness    Environmental allergies    Full dentures    GERD (gastroesophageal reflux disease) 06/25/2013   H/O bilateral cataract extraction 2019   History of diverticulosis    History of kidney stones    HLD (hyperlipidemia)    Hyperlipidemia, unspecified    Hypertension    Nephrolithiasis    Osteoporosis, post-menopausal    Parkinson disease    Pre-diabetes    PVC's (premature ventricular contractions)    Scoliosis    Thoracic kyphosis    Trifascicular bundle branch block    a.) 1st degree AVB + RBBB + LAFB   Vascular disease    Past Surgical History:  Procedure Laterality Date   APPENDECTOMY     BREAST EXCISIONAL  BIOPSY Left 1984   neg   BREAST EXCISIONAL BIOPSY Right 1997   lipoma   CATARACT EXTRACTION W/PHACO Left 09/02/2017   Procedure: CATARACT EXTRACTION PHACO AND INTRAOCULAR LENS PLACEMENT (IOC)  LEFT;  Surgeon: Lockie Mola, MD;  Location: [facility];  Service: Ophthalmology;  Laterality: Left;   CATARACT EXTRACTION W/PHACO Right 10/01/2017   Procedure: CATARACT EXTRACTION PHACO AND INTRAOCULAR LENS PLACEMENT (IOC) RIGHT;  Surgeon: Lockie Mola, MD;  Location: [facility];  Service: Ophthalmology;  Laterality: Right;   COLONOSCOPY WITH PROPOFOL N/A 11/27/2016   Procedure: COLONOSCOPY WITH PROPOFOL;  Surgeon: Toledo, Boykin Nearing, MD;  Location: [facility];  Service: Gastroenterology;  Laterality: N/A;   CORONARY ANGIOPLASTY WITH STENT PLACEMENT Left 06/25/2013   Procedure: CORONARY ANGIOPLASTY WITH STENT PLACEMENT; Location: [facility]; Surgeon: Rolly Salter, MD   KNEE ARTHROPLASTY Left 01/22/2017   Procedure: COMPUTER ASSISTED TOTAL KNEE ARTHROPLASTY;  Surgeon: Donato Heinz, MD;  Location: [facility];  Service: Orthopedics;  Laterality: Left;   KNEE ARTHROSCOPY Left 02/01/2015   Procedure: ARTHROSCOPY KNEE, partial medial & lateral menisectomy,,medial and patelofemoral chondroplasty;  Surgeon: Donato Heinz, MD;  Location: [facility];  Service: Orthopedics;  Laterality: Left;   LEFT HEART CATH AND CORONARY ANGIOGRAPHY Left 03/10/2012   Procedure: LEFT HEART CATH AND CORONARY ANGIOGRAPHY; Location: [facility]; Surgeon: Marcina Millard, MD   LITHOTRIPSY Right 1995   RIGHT AXILLARY LIPOMA REMOVAL     TUBAL LIGATION     VARICOSE VEIN SURGERY     Patient Active Problem List   Diagnosis Date Noted   Primary osteoarthritis of hip 02/09/2017   Primary osteoarthritis of knee 02/09/2017   Chronic sinus bradycardia 01/22/2017   Degenerative disc disease, lumbar 01/22/2017   Dizziness 01/22/2017   Nephrolithiasis 01/22/2017   Osteoporosis, 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/22/2017   PVC's  (premature ventricular contractions) 01/22/2017   S/P total knee arthroplasty 01/22/2017   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   CKD (chronic kidney disease), 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   GERD (gastroesophageal reflux disease) 06/25/2013   Coronary artery disease involving native coronary artery of native heart 06/24/2013   Essential hypertension 06/21/2013   Mixed hyperlipidemia 06/21/2013    ONSET DATE: March 2019  REFERRING DIAG: Parkinson's Disease  THERAPY DIAG:  Parkinson's disease, unspecified whether dyskinesia present, unspecified whether manifestations fluctuate  Muscle weakness (generalized)  Other abnormalities of gait and mobility  Unsteadiness on feet  Dysarthria and anarthria  Rationale for Evaluation and Treatment: Rehabilitation  SUBJECTIVE:                                                                                                                                                                                             SUBJECTIVE STATEMENT: Pt reports doing okay. States that she had some pain/soreness in middle of back this morning when she woke up, but has resolved prior to PT treatment. Also states that her BP was elevated when assessed at home with systolic reading in the 170s. Pt reports that this was taken premedicated and while back pain present.   Pt accompanied by: self  PERTINENT HISTORY:   Patient reporting to physical therapy for general unsteadiness and weakness as it relates to Parkinson's disease. Has been seen in the past for PT in 2019. PMH includes: Parkinson's disease, Vascular disease, Dizziness, DDD, GERD, arthritis, CKD, CAD. Reports no falls in the past 6 months. Is currently medicated for Parkinson's; taking medication 4x a day. She notices her biggest struggle is doing activities for longer durations of time. She also has concerns of feeling off balance when reacting quickly to environment.   PAIN:  Are you having pain? No and  Yes: NPRS scale: 5/10 Pain location: back Pain description: burn Aggravating factors:   Relieving factors:    PRECAUTIONS: None  WEIGHT BEARING RESTRICTIONS: No  FALLS:  Has patient fallen in last 6 months? No  LIVING ENVIRONMENT: Lives with: lives alone and daughter lives next door. Lives in: House/apartment Stairs: Yes: External: 4 steps; on right going up and on left going up Has following equipment at home:  None, but has use cane in the past. Has shower rails, shower chair; doesn't use  PLOF: Independent with basic ADLs, Independent with household mobility without device, Independent with gait, and Independent with transfers  PATIENT GOALS: Get stronger, be able to stand long enough to cook lunch/ meals  OBJECTIVE:   DIAGNOSTIC FINDINGS:   MRI THORACIC SPINE WITHOUT CONTRAST: IMPRESSION: 1. Scoliosis of the thoracic spine is convex to the left at T3. 2. Exaggerated thoracic kyphosis is stable 3. Mild right foraminal narrowing at T2-3 and T3-4. 4. Mild disc bulging and facet hypertrophy at T6-7, T7-8, T10-11, and T11-12 without significant stenosis. 5. Chronic fatty endplate marrow changes across the endplates T4-5 through T11-12 with the exception of T6-7 and T7-8 which changes are more edematous.  CHEST - 2 VIEW :IMPRESSION: No active cardiopulmonary disease.  COGNITION: Overall cognitive status: Within functional limits for tasks assessed    POSTURE: rounded shoulders, forward head, and increased thoracic kyphosis    LOWER EXTREMITY MMT:    MMT Right Eval Left Eval  Hip flexion 4- 4  Hip extension    Hip abduction 4 4  Hip adduction 4- 4-  Hip internal rotation    Hip external rotation    Knee flexion 4- 4-  Knee extension 4- 4  Ankle dorsiflexion 4- 4-  Ankle plantarflexion    Ankle inversion    Ankle eversion    (Blank rows = not tested)   GAIT: Gait pattern:  Pt ability to walk in straight line diminished as progressed, primarily  drifting to R side,  and decreased stride length Distance walked: 937 ft Assistive device utilized: None Level of assistance: CGA Comments: Pt walking at slow pace, though able to continuously ambulate throughout the 6 mins.  FUNCTIONAL TESTS:  5 times sit to stand: 23.99 sec Timed up and go (TUG): 14.38 sec; 2nd attempt, needed additional cue for correct completion of the test. 6 minute walk test: 937 ft, see additional comment per Gait assessment above. 10 meter walk test: 11.32 secs; 0.88 m/s Berg Balance Scale: 40, see details below, assessed 2nd session     PATIENT SURVEYS:  FOTO 53  TODAY'S TREATMENT:                                                                                                                              DATE: 10/01/22  BP taken at start of treatment session.  151/72 HR 75  Nustep level 1-3 x 5 min with cues for full ROM to allow improved shoulder extension   Gait through rehab gym x 331ft with cues from PT for increased gait speed on second 174ft noted to have increased UE swing on this day  compared to prior sessions with this PT. Tremor in the RUE more pronounced with fatigue.   PT instructed pt in seated PWR! PD exercise to improve postural control and emphasize large amplitude sustained erect posture positions.  PWR UP x 12 PWR rock x 12 PWR twist x 10 PWR step. X 10   Standing PWR!  PWR UP x 10  PWR Rock x 10  PWR step. X 10   Min assist for standing PWR step to improve coordination of Bil UE movement and achieve full ROM.  Visual and verbal feedback throughout all movements to improve ROM and technique.   PATIENT EDUCATION: Education details: Educated on instruction for tests and measures, outcome measures, and POC. Pt educated throughout session about proper posture and technique with exercises. Improved exercise technique, movement at target joints, use of target muscles after min to mod verbal, visual, tactile cues.  Person educated:  Patient Education method: Medical illustrator Education comprehension: verbalized understanding  HOME EXERCISE PROGRAM: Access Code: [facility] [email or web address]  Date: 07/17/2022 Prepared by: Grier Rocher Exercises - Standing Single Leg  Stance with Counter Support  - 1 x daily - 7 x weekly - 3 sets - 10 reps - 2 hold -  Seated Hip Abduction with Resistance  - 1 x daily - 7 x weekly - 3 sets - 10 reps -  Seated Long Arc Quad  - 1 x daily - 7 x weekly - 3 sets - 10 reps - 3 hold   Access Code: [email or web address] URL: [email or web address] Date: 08/19/2022 Prepared by: Grier Rocher  Exercises - Sit to Stand with Armchair  - 1 x daily - 7 x weekly - 3 sets - 8 reps - Side Stepping with Counter Support  - 1 x daily - 7 x weekly - 3 sets - 8 reps  GOALS: Goals reviewed with patient? No  SHORT TERM GOALS: Target date: 08/12/2022    Patient will be independent in home exercise program to improve strength/mobility for better functional independence with ADLs. Baseline: provided  Goal status:  Progressing   LONG TERM GOALS: Target date: 10/07/2022   1.  Patient (> 40 years old) will complete five times sit to stand test in < 15 seconds indicating an increased LE strength and improved balance. Baseline: 23.99 sec 8/6 Goal status: 14.81 MET  2.  Patient will increase FOTO score to equal to or greater than  59  to demonstrate statistically significant improvement in mobility and quality of life.  Baseline: 53 8/6 59 Goal status:MET 3.  Patient will increase Berg Balance score by > 6 points to demonstrate decreased fall risk during functional activities. Baseline: Assessed 2nd session: 40 Goal status: INITIAL   4.   Patient will reduce timed up and go to <11 seconds to reduce fall risk and demonstrate improved transfer/gait ability. Baseline: 14.38 8/6: 11.97 Goal status:progressing   5.   Patient will increase 10 meter walk test to >1.28m/s  as to improve gait speed for better community ambulation and to reduce fall risk. Baseline: 0.88 m/s 8/6: 9.34, 9.89 (1.35m/s) Goal status: Met  6.   Patient will increase six minute walk test distance to >1000 for progression to community ambulator and improve gait ability Baseline: 937 feet Goal status: INITIAL     ASSESSMENT:  CLINICAL IMPRESSION:  Patient presents to PT with no pain on this day. High BP at home, but [facility] while at PT clinic. PT treatment focused on PWR! Movements in sitting and standing with hand  out provided to perform at home. Verbal and visual feedback required for proper ROM and technique as well as sequencing of BUE movements on this day. Noted to have improved UE swing with movement gait on this day as well . Pt will continue to benefit from skilled physical therapy intervention to address impairments, improve QOL, and attain therapy goals.   OBJECTIVE IMPAIRMENTS: decreased balance, decreased endurance, decreased strength, and Abnormal Gait; R side drift .   ACTIVITY LIMITATIONS: bending, standing, and locomotion level  PARTICIPATION LIMITATIONS: meal prep, cleaning, and community activity  PERSONAL FACTORS: Age, Fitness, and Past/current experiences are also affecting patient's functional outcome.   REHAB POTENTIAL: Good  CLINICAL DECISION MAKING: Stable/uncomplicated  EVALUATION COMPLEXITY: Low  PLAN:  PT FREQUENCY: 1-2x/week  PT DURATION: 12 weeks  PLANNED INTERVENTIONS: Therapeutic exercises, Therapeutic activity, Neuromuscular re-education, Balance training, Gait training, Patient/Family education, Self Care, Joint mobilization, Vestibular training, Cryotherapy, Moist heat, and Re-evaluation  PLAN FOR NEXT SESSION:   Review HEP.  Dynamic balance trianing.   Golden Pop, PT 10/01/2022, 9:19 AM    9:19 AM 10/01/22

## 2022-10-03 ENCOUNTER — Other Ambulatory Visit: Payer: Self-pay | Admitting: Student

## 2022-10-03 DIAGNOSIS — G44221 Chronic tension-type headache, intractable: Secondary | ICD-10-CM

## 2022-10-04 ENCOUNTER — Ambulatory Visit: Payer: Medicare Other | Admitting: Physical Therapy

## 2022-10-08 ENCOUNTER — Ambulatory Visit
Admission: RE | Admit: 2022-10-08 | Discharge: 2022-10-08 | Disposition: A | Discharge status: Home / Self Care | Payer: Medicare Other | Source: Ambulatory Visit | Attending: Student | Admitting: Student

## 2022-10-08 ENCOUNTER — Ambulatory Visit: Payer: Medicare Other | Admitting: Physical Therapy

## 2022-10-08 DIAGNOSIS — G20A1 Parkinson's disease without dyskinesia, without mention of fluctuations: Secondary | ICD-10-CM | POA: Diagnosis not present

## 2022-10-08 DIAGNOSIS — G44221 Chronic tension-type headache, intractable: Secondary | ICD-10-CM | POA: Insufficient documentation

## 2022-10-08 DIAGNOSIS — R2689 Other abnormalities of gait and mobility: Secondary | ICD-10-CM

## 2022-10-08 DIAGNOSIS — R2681 Unsteadiness on feet: Secondary | ICD-10-CM

## 2022-10-08 DIAGNOSIS — R471 Dysarthria and anarthria: Secondary | ICD-10-CM

## 2022-10-08 DIAGNOSIS — M6281 Muscle weakness (generalized): Secondary | ICD-10-CM

## 2022-10-08 NOTE — Therapy (Signed)
OUTPATIENT PHYSICAL THERAPY NEURO TREATMENT NOTE/ Re-certification    Patient Name: Katelyn Crawford MRN: [id number] DOB:1940/08/02, 82 y.o., female Today's Date: 10/08/2022   PCP:   Marguarite Arbour, MD   REFERRING PROVIDER: Lonell Face, MD   END OF SESSION:  PT End of Session - 10/08/22 0839     Visit Number 16    Number of Visits 24    Date for PT Re-Evaluation 10/29/22    Progress Note Due on Visit 20    PT Start Time 0842    PT Stop Time 0925    PT Time Calculation (min) 43 min    Equipment Utilized During Treatment Gait belt    Activity Tolerance Patient tolerated treatment well    Behavior During Therapy WFL for tasks assessed/performed                   Past Medical History:  Diagnosis Date   Anemia    Aortic atherosclerosis (HCC)    Arthritis    Cardiomegaly    Cardiomyopathy (HCC)    Cholelithiasis    Chronic kidney disease, stage 2 (mild)    Chronic sinus bradycardia    Coronary artery disease 03/10/2012   a.) LHC 03/10/2012: 40% mLAD, 50% pRI, 50% pRCA, 90% mRCA, 50% dRCA, 50% RPDA - unable to cannulate RCA - med mgmt; b.) LHC/PCI 06/25/2013: 30% pLAD, 40% mLAD, 20% mLCx, 30% RI, 30% pRCA, 70% mRCA (2.5 mm Promus DES), 40% dRCA-1, 90% dRCA-2 (2.5 mm Promus DES)   DDD (degenerative disc disease), thoracolumbar    Diverticulosis    Dizziness    Environmental allergies    Full dentures    GERD (gastroesophageal reflux disease) 06/25/2013   H/O bilateral cataract extraction 2019   History of diverticulosis    History of kidney stones    HLD (hyperlipidemia)    Hyperlipidemia, unspecified    Hypertension    Nephrolithiasis    Osteoporosis, post-menopausal    Parkinson disease    Pre-diabetes    PVC's (premature ventricular contractions)    Scoliosis    Thoracic kyphosis    Trifascicular bundle branch block    a.) 1st degree AVB + RBBB + LAFB   Vascular disease    Past Surgical History:  Procedure Laterality Date   APPENDECTOMY      BREAST EXCISIONAL BIOPSY Left 1984   neg   BREAST EXCISIONAL BIOPSY Right 1997   lipoma   CATARACT EXTRACTION W/PHACO Left 09/02/2017   Procedure: CATARACT EXTRACTION PHACO AND INTRAOCULAR LENS PLACEMENT (IOC)  LEFT;  Surgeon: Lockie Mola, MD;  Location: [facility];  Service: Ophthalmology;  Laterality: Left;   CATARACT EXTRACTION W/PHACO Right 10/01/2017   Procedure: CATARACT EXTRACTION PHACO AND INTRAOCULAR LENS PLACEMENT (IOC) RIGHT;  Surgeon: Lockie Mola, MD;  Location: [facility];  Service: Ophthalmology;  Laterality: Right;   COLONOSCOPY WITH PROPOFOL N/A 11/27/2016   Procedure: COLONOSCOPY WITH PROPOFOL;  Surgeon: Toledo, Boykin Nearing, MD;  Location: [facility];  Service: Gastroenterology;  Laterality: N/A;   CORONARY ANGIOPLASTY WITH STENT PLACEMENT Left 06/25/2013   Procedure: CORONARY ANGIOPLASTY WITH STENT PLACEMENT; Location: [facility]; Surgeon: Rolly Salter, MD   KNEE ARTHROPLASTY Left 01/22/2017   Procedure: COMPUTER ASSISTED TOTAL KNEE ARTHROPLASTY;  Surgeon: Donato Heinz, MD;  Location: [facility];  Service: Orthopedics;  Laterality: Left;   KNEE ARTHROSCOPY Left 02/01/2015   Procedure: ARTHROSCOPY KNEE, partial medial & lateral menisectomy,,medial and patelofemoral chondroplasty;  Surgeon: Donato Heinz, MD;  Location: [facility];  Service: Orthopedics;  Laterality: Left;   LEFT HEART CATH AND CORONARY ANGIOGRAPHY Left 03/10/2012   Procedure: LEFT HEART CATH AND CORONARY ANGIOGRAPHY; Location: [facility]; Surgeon: Marcina Millard, MD   LITHOTRIPSY Right 1995   RIGHT AXILLARY LIPOMA REMOVAL     TUBAL LIGATION     VARICOSE VEIN SURGERY     Patient Active Problem List   Diagnosis Date Noted   Primary osteoarthritis of hip 02/09/2017   Primary osteoarthritis of knee 02/09/2017   Chronic sinus bradycardia 01/22/2017   Degenerative disc disease, lumbar 01/22/2017   Dizziness 01/22/2017   Nephrolithiasis 01/22/2017   Osteoporosis, post-menopausal  01/22/2017   PVC's (premature ventricular contractions) 01/22/2017   S/P total knee arthroplasty 01/22/2017   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   CKD (chronic kidney disease), 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   GERD (gastroesophageal reflux disease) 06/25/2013   Coronary artery disease involving native coronary artery of native heart 06/24/2013   Essential hypertension 06/21/2013   Mixed hyperlipidemia 06/21/2013    ONSET DATE: March 2019  REFERRING DIAG: Parkinson's Disease  THERAPY DIAG:  Parkinson's disease, unspecified whether dyskinesia present, unspecified whether manifestations fluctuate  Muscle weakness (generalized)  Other abnormalities of gait and mobility  Unsteadiness on feet  Dysarthria and anarthria  Rationale for Evaluation and Treatment: Rehabilitation  SUBJECTIVE:                                                                                                                                                                                             SUBJECTIVE STATEMENT: Pt reports doing okay. Comes to PT on this day following an MRI. States that this was taken to track progress of PD. No pain to report on this day. Wad able to go on trip over the weekend no issues with mobility reported, was even able to play miniature golf. Small incident yesterday with low Bllood pressure with mild s/s, but s/s have resolved.   Pt accompanied by: self  PERTINENT HISTORY:   Patient reporting to physical therapy for general unsteadiness and weakness as it relates to Parkinson's disease. Has been seen in the past for PT in 2019. PMH includes: Parkinson's disease, Vascular disease, Dizziness, DDD, GERD, arthritis, CKD, CAD. Reports no falls in the past 6 months. Is currently medicated for Parkinson's; taking medication 4x a day. She notices her biggest struggle is doing activities for longer durations of time. She also has concerns of feeling off balance when reacting quickly to  environment.   PAIN:  Are you having pain? No and Yes: NPRS scale: 5/10 Pain location: back Pain description: burn Aggravating factors:   Relieving factors:  PRECAUTIONS: None  WEIGHT BEARING RESTRICTIONS: No  FALLS: Has patient fallen in last 6 months? No  LIVING ENVIRONMENT: Lives with: lives alone and daughter lives next door. Lives in: House/apartment Stairs: Yes: External: 4 steps; on right going up and on left going up Has following equipment at home:  None, but has use cane in the past. Has shower rails, shower chair; doesn't use  PLOF: Independent with basic ADLs, Independent with household mobility without device, Independent with gait, and Independent with transfers  PATIENT GOALS: Get stronger, be able to stand long enough to cook lunch/ meals  OBJECTIVE:   DIAGNOSTIC FINDINGS:   MRI THORACIC SPINE WITHOUT CONTRAST: IMPRESSION: 1. Scoliosis of the thoracic spine is convex to the left at T3. 2. Exaggerated thoracic kyphosis is stable 3. Mild right foraminal narrowing at T2-3 and T3-4. 4. Mild disc bulging and facet hypertrophy at T6-7, T7-8, T10-11, and T11-12 without significant stenosis. 5. Chronic fatty endplate marrow changes across the endplates T4-5 through T11-12 with the exception of T6-7 and T7-8 which changes are more edematous.  CHEST - 2 VIEW :IMPRESSION: No active cardiopulmonary disease.  COGNITION: Overall cognitive status: Within functional limits for tasks assessed    POSTURE: rounded shoulders, forward head, and increased thoracic kyphosis    LOWER EXTREMITY MMT:    MMT Right Eval Left Eval  Hip flexion 4- 4  Hip extension    Hip abduction 4 4  Hip adduction 4- 4-  Hip internal rotation    Hip external rotation    Knee flexion 4- 4-  Knee extension 4- 4  Ankle dorsiflexion 4- 4-  Ankle plantarflexion    Ankle inversion    Ankle eversion    (Blank rows = not tested)   GAIT: Gait pattern:  Pt ability to walk in  straight line diminished as progressed, primarily drifting to R side,  and decreased stride length Distance walked: 937 ft Assistive device utilized: None Level of assistance: CGA Comments: Pt walking at slow pace, though able to continuously ambulate throughout the 6 mins.  FUNCTIONAL TESTS:  See Goal assessment below.   [facility] PT Assessment - 10/08/22 0001       Berg Balance Test   Sit to Stand Able to stand without using hands and stabilize independently    Standing Unsupported Able to stand safely 2 minutes    Sitting with Back Unsupported but Feet Supported on Floor or Stool Able to sit safely and securely 2 minutes    Stand to Sit Sits safely with minimal use of hands    Transfers Able to transfer safely, minor use of hands    Standing Unsupported with Eyes Closed Able to stand 10 seconds safely    Standing Unsupported with Feet Together Able to place feet together independently and stand for 1 minute with supervision    From Standing, Reach Forward with Outstretched Arm Can reach confidently >25 cm (10")    From Standing Position, Pick up Object from Floor Able to pick up shoe, needs supervision    From Standing Position, Turn to Look Behind Over each Shoulder Turn sideways only but maintains balance    Turn 360 Degrees Able to turn 360 degrees safely but slowly    Standing Unsupported, Alternately Place Feet on Step/Stool Able to stand independently and complete 8 steps >20 seconds    Standing Unsupported, One Foot in Front Able to plae foot ahead of the other independently and hold 30 seconds    Standing on One  Leg Tries to lift leg/unable to hold 3 seconds but remains standing independently    Total Score 45               PATIENT SURVEYS:  FOTO 53; 9/17 53  TODAY'S TREATMENT:                                                                                                                              DATE: 10/08/22  BP taken at start of treatment session.  133/68  HR 68  Pt performed 5 time sit<>stand (5xSTS): 13.35 sec (>15 sec indicates increased fall risk)   Patient demonstrates increased fall risk as noted by score of   45/56 on Berg Balance Scale.  (<36= high risk for falls, close to 100%; 37-45 significant >80%; 46-51 moderate >50%; 52-55 lower >25%)  6 Min Walk Test:  Instructed patient to ambulate as quickly and as safely as possible for 6 minutes using LRAD. Patient was allowed to take standing rest breaks without stopping the test, but if the patient required a sitting rest break the clock would be stopped and the test would be over.  Results: 1200 feet (365 meters) using no AD with Supervision assist. Results indicate that the patient has reduced endurance with ambulation compared to age matched norms.  Age Matched Norms: 39-69 yo M: 13 F: 56, 1-79 yo M: 38 F: 471, 75-89 yo M: 417 F: 392 MDC: 58.21 meters (190.98 feet) or 50 meters (ANPTA Core Set of Outcome Measures for Adults with Neurologic Conditions, 2018)  PT instructed pt in TUG: 11.88 sec (average of 3 trials; >13.5 sec indicates increased fall risk)    PATIENT EDUCATION: Education details: Educated on instruction for tests and measures, outcome measures, and POC. Pt educated throughout session about proper posture and technique with exercises. Improved exercise technique, movement at target joints, use of target muscles after min to mod verbal, visual, tactile cues.  Person educated: Patient Education method: Medical illustrator Education comprehension: verbalized understanding  HOME EXERCISE PROGRAM: Access Code: [facility] [email or web address]  Date: 07/17/2022 Prepared by: Grier Rocher Exercises - Standing Single Leg  Stance with Counter Support  - 1 x daily - 7 x weekly - 3 sets - 10 reps - 2 hold -  Seated Hip Abduction with Resistance  - 1 x daily - 7 x weekly - 3 sets - 10 reps -  Seated Long Arc Quad  - 1 x daily - 7 x weekly - 3 sets -  10 reps - 3 hold   Access Code: [email or web address] URL: [email or web address] Date: 08/19/2022 Prepared by: Grier Rocher  Exercises - Sit to Stand with Armchair  - 1 x daily - 7 x weekly - 3 sets - 8 reps - Side Stepping with Counter Support  - 1 x daily - 7 x weekly - 3 sets - 8 reps  GOALS: Goals reviewed with patient? No  SHORT TERM GOALS: Target date: 08/12/2022  Patient will be independent in home exercise program to improve strength/mobility for better functional independence with ADLs. Baseline: provided  Goal status:  Progressing   LONG TERM GOALS: Target date: 10/07/2022   1.  Patient (> 71 years old) will complete five times sit to stand test in < 15 seconds indicating an increased LE strength and improved balance. Baseline: 23.99 sec 8/6: 14.81 9/17: 13.35sec  Goal status: MET   2.  Patient will increase FOTO score to equal to or greater than  59  to demonstrate statistically significant improvement in mobility and quality of life.  Baseline: 53 8/6 59 Goal status: in progress   3.  Patient will increase Berg Balance score by > 6 points to demonstrate decreased fall risk during functional activities. Baseline: Assessed 2nd session: 40 9/17: 45 Goal status:  in progress   4.   Patient will reduce timed up and go to <11 seconds to reduce fall risk and demonstrate improved transfer/gait ability. Baseline: 14.38 8/6: 11.97 9/17 11.88 sec  Goal status:  in progress  5.   Patient will increase 10 meter walk test to >1.52m/s as to improve gait speed for better community ambulation and to reduce fall risk. Baseline: 0.88 m/s 8/6: 9.34, 9.89 (1.86m/s) 9/17: 9.69sec Goal status: Met  6.   Patient will increase six minute walk test distance to >1000 for progression to community ambulator and improve gait ability Baseline: 937 feet 9/17: 1282ft Goal status: MET     ASSESSMENT:  CLINICAL IMPRESSION:  Patient presents to PT with no pain on this day.  States that BP was slightly low yesterday. WFL with PT at start of PT treatment, no s/s. PT Treatment focused on goal assessment and revision for PT re-certification. Pt has me t3 of 6 goals. Pt demonstrates improvement in endurance, and balance with improved walk test to 1241ft, Berg increased to 45 , and 5x STS to 13sec. No substantial improvement in TUG or FOTO. Pt continues to demonstrate increased fall risk with Berg of only 45 and tug of 11.88sec. and reduced distance in 6 min walk test compared to age matched normative values.  Patient's condition has the potential to improve in response to therapy. Maximum improvement is yet to be obtained. The anticipated improvement is attainable and reasonable in a generally predictable time.  Pt will continue to benefit from skilled physical therapy intervention to address impairments, improve QOL, and attain therapy goals.   OBJECTIVE IMPAIRMENTS: decreased balance, decreased endurance, decreased strength, and Abnormal Gait; R side drift .   ACTIVITY LIMITATIONS: bending, standing, and locomotion level  PARTICIPATION LIMITATIONS: meal prep, cleaning, and community activity  PERSONAL FACTORS: Age, Fitness, and Past/current experiences are also affecting patient's functional outcome.   REHAB POTENTIAL: Good  CLINICAL DECISION MAKING: Stable/uncomplicated  EVALUATION COMPLEXITY: Low  PLAN:  PT FREQUENCY: 1-2x/week  PT DURATION: 3 weeks  PLANNED INTERVENTIONS: Therapeutic exercises, Therapeutic activity, Neuromuscular re-education, Balance training, Gait training, Patient/Family education, Self Care, Joint mobilization, Vestibular training, Cryotherapy, Moist heat, and Re-evaluation  PLAN FOR NEXT SESSION:   Continue to Review HEP.  Endurance and safety in turns.   Golden Pop, PT 10/08/2022, 10:57 AM    10:57 AM 10/08/22

## 2022-10-11 ENCOUNTER — Ambulatory Visit: Payer: Medicare Other | Admitting: Physical Therapy

## 2022-10-11 DIAGNOSIS — R2689 Other abnormalities of gait and mobility: Secondary | ICD-10-CM

## 2022-10-11 DIAGNOSIS — R2681 Unsteadiness on feet: Secondary | ICD-10-CM

## 2022-10-11 DIAGNOSIS — G20A1 Parkinson's disease without dyskinesia, without mention of fluctuations: Secondary | ICD-10-CM

## 2022-10-11 DIAGNOSIS — M6281 Muscle weakness (generalized): Secondary | ICD-10-CM

## 2022-10-15 ENCOUNTER — Ambulatory Visit: Payer: Medicare Other | Admitting: Physical Therapy

## 2022-10-15 DIAGNOSIS — M6281 Muscle weakness (generalized): Secondary | ICD-10-CM

## 2022-10-15 DIAGNOSIS — G20A1 Parkinson's disease without dyskinesia, without mention of fluctuations: Secondary | ICD-10-CM

## 2022-10-15 DIAGNOSIS — R2681 Unsteadiness on feet: Secondary | ICD-10-CM

## 2022-10-15 DIAGNOSIS — R2689 Other abnormalities of gait and mobility: Secondary | ICD-10-CM

## 2022-10-15 NOTE — Therapy (Signed)
OUTPATIENT PHYSICAL THERAPY NEURO TREATMENT NOTE/ Re-certification    Patient Name: Katelyn Crawford MRN: [id number] DOB:01/05/1941, 82 y.o., female Today's Date: 10/15/2022   PCP:   Marguarite Arbour, MD   REFERRING PROVIDER: Lonell Face, MD   END OF SESSION:  PT End of Session - 10/15/22 0942     Visit Number 17    Number of Visits 24    Date for PT Re-Evaluation 10/29/22    Progress Note Due on Visit 20    PT Start Time 0935    PT Stop Time 1015    PT Time Calculation (min) 40 min    Equipment Utilized During Treatment Gait belt    Activity Tolerance Patient tolerated treatment well    Behavior During Therapy WFL for tasks assessed/performed                    Past Medical History:  Diagnosis Date   Anemia    Aortic atherosclerosis (HCC)    Arthritis    Cardiomegaly    Cardiomyopathy (HCC)    Cholelithiasis    Chronic kidney disease, stage 2 (mild)    Chronic sinus bradycardia    Coronary artery disease 03/10/2012   a.) LHC 03/10/2012: 40% mLAD, 50% pRI, 50% pRCA, 90% mRCA, 50% dRCA, 50% RPDA - unable to cannulate RCA - med mgmt; b.) LHC/PCI 06/25/2013: 30% pLAD, 40% mLAD, 20% mLCx, 30% RI, 30% pRCA, 70% mRCA (2.5 mm Promus DES), 40% dRCA-1, 90% dRCA-2 (2.5 mm Promus DES)   DDD (degenerative disc disease), thoracolumbar    Diverticulosis    Dizziness    Environmental allergies    Full dentures    GERD (gastroesophageal reflux disease) 06/25/2013   H/O bilateral cataract extraction 2019   History of diverticulosis    History of kidney stones    HLD (hyperlipidemia)    Hyperlipidemia, unspecified    Hypertension    Nephrolithiasis    Osteoporosis, post-menopausal    Parkinson disease    Pre-diabetes    PVC's (premature ventricular contractions)    Scoliosis    Thoracic kyphosis    Trifascicular bundle branch block    a.) 1st degree AVB + RBBB + LAFB   Vascular disease    Past Surgical History:  Procedure Laterality Date   APPENDECTOMY      BREAST EXCISIONAL BIOPSY Left 1984   neg   BREAST EXCISIONAL BIOPSY Right 1997   lipoma   CATARACT EXTRACTION W/PHACO Left 09/02/2017   Procedure: CATARACT EXTRACTION PHACO AND INTRAOCULAR LENS PLACEMENT (IOC)  LEFT;  Surgeon: Lockie Mola, MD;  Location: [facility];  Service: Ophthalmology;  Laterality: Left;   CATARACT EXTRACTION W/PHACO Right 10/01/2017   Procedure: CATARACT EXTRACTION PHACO AND INTRAOCULAR LENS PLACEMENT (IOC) RIGHT;  Surgeon: Lockie Mola, MD;  Location: [facility];  Service: Ophthalmology;  Laterality: Right;   COLONOSCOPY WITH PROPOFOL N/A 11/27/2016   Procedure: COLONOSCOPY WITH PROPOFOL;  Surgeon: Toledo, Boykin Nearing, MD;  Location: [facility];  Service: Gastroenterology;  Laterality: N/A;   CORONARY ANGIOPLASTY WITH STENT PLACEMENT Left 06/25/2013   Procedure: CORONARY ANGIOPLASTY WITH STENT PLACEMENT; Location: [facility]; Surgeon: Rolly Salter, MD   KNEE ARTHROPLASTY Left 01/22/2017   Procedure: COMPUTER ASSISTED TOTAL KNEE ARTHROPLASTY;  Surgeon: Donato Heinz, MD;  Location: [facility];  Service: Orthopedics;  Laterality: Left;   KNEE ARTHROSCOPY Left 02/01/2015   Procedure: ARTHROSCOPY KNEE, partial medial & lateral menisectomy,,medial and patelofemoral chondroplasty;  Surgeon: Donato Heinz, MD;  Location: [facility];  Service: Orthopedics;  Laterality: Left;   LEFT HEART CATH AND CORONARY ANGIOGRAPHY Left 03/10/2012   Procedure: LEFT HEART CATH AND CORONARY ANGIOGRAPHY; Location: [facility]; Surgeon: Marcina Millard, MD   LITHOTRIPSY Right 1995   RIGHT AXILLARY LIPOMA REMOVAL     TUBAL LIGATION     VARICOSE VEIN SURGERY     Patient Active Problem List   Diagnosis Date Noted   Primary osteoarthritis of hip 02/09/2017   Primary osteoarthritis of knee 02/09/2017   Chronic sinus bradycardia 01/22/2017   Degenerative disc disease, lumbar 01/22/2017   Dizziness 01/22/2017   Nephrolithiasis 01/22/2017   Osteoporosis, post-menopausal  01/22/2017   PVC's (premature ventricular contractions) 01/22/2017   S/P total knee arthroplasty 01/22/2017   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   CKD (chronic kidney disease), 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   GERD (gastroesophageal reflux disease) 06/25/2013   Coronary artery disease involving native coronary artery of native heart 06/24/2013   Essential hypertension 06/21/2013   Mixed hyperlipidemia 06/21/2013    ONSET DATE: March 2019  REFERRING DIAG: Parkinson's Disease  THERAPY DIAG:  Parkinson's disease, unspecified whether dyskinesia present, unspecified whether manifestations fluctuate  Muscle weakness (generalized)  Other abnormalities of gait and mobility  Unsteadiness on feet  Rationale for Evaluation and Treatment: Rehabilitation  SUBJECTIVE:                                                                                                                                                                                             SUBJECTIVE STATEMENT:     Pt reports that she is doing well. No pain or medical update on this day.   Pt accompanied by: self  PERTINENT HISTORY:   Patient reporting to physical therapy for general unsteadiness and weakness as it relates to Parkinson's disease. Has been seen in the past for PT in 2019. PMH includes: Parkinson's disease, Vascular disease, Dizziness, DDD, GERD, arthritis, CKD, CAD. Reports no falls in the past 6 months. Is currently medicated for Parkinson's; taking medication 4x a day. She notices her biggest struggle is doing activities for longer durations of time. She also has concerns of feeling off balance when reacting quickly to environment.   PAIN:  Are you having pain? No and Yes: NPRS scale: 5/10 Pain location: back Pain description: burn Aggravating factors:   Relieving factors:    PRECAUTIONS: None  WEIGHT BEARING RESTRICTIONS: No  FALLS: Has patient fallen in last 6 months? No  LIVING  ENVIRONMENT: Lives with: lives alone and daughter lives next door. Lives in: House/apartment Stairs: Yes: External: 4 steps; on right going up and on left going up Has following  equipment at home:  None, but has use cane in the past. Has shower rails, shower chair; doesn't use  PLOF: Independent with basic ADLs, Independent with household mobility without device, Independent with gait, and Independent with transfers  PATIENT GOALS: Get stronger, be able to stand long enough to cook lunch/ meals  OBJECTIVE:   DIAGNOSTIC FINDINGS:   MRI THORACIC SPINE WITHOUT CONTRAST: IMPRESSION: 1. Scoliosis of the thoracic spine is convex to the left at T3. 2. Exaggerated thoracic kyphosis is stable 3. Mild right foraminal narrowing at T2-3 and T3-4. 4. Mild disc bulging and facet hypertrophy at T6-7, T7-8, T10-11, and T11-12 without significant stenosis. 5. Chronic fatty endplate marrow changes across the endplates T4-5 through T11-12 with the exception of T6-7 and T7-8 which changes are more edematous.  CHEST - 2 VIEW :IMPRESSION: No active cardiopulmonary disease.  COGNITION: Overall cognitive status: Within functional limits for tasks assessed    POSTURE: rounded shoulders, forward head, and increased thoracic kyphosis    LOWER EXTREMITY MMT:    MMT Right Eval Left Eval  Hip flexion 4- 4  Hip extension    Hip abduction 4 4  Hip adduction 4- 4-  Hip internal rotation    Hip external rotation    Knee flexion 4- 4-  Knee extension 4- 4  Ankle dorsiflexion 4- 4-  Ankle plantarflexion    Ankle inversion    Ankle eversion    (Blank rows = not tested)   GAIT: Gait pattern:  Pt ability to walk in straight line diminished as progressed, primarily drifting to R side,  and decreased stride length Distance walked: 937 ft Assistive device utilized: None Level of assistance: CGA Comments: Pt walking at slow pace, though able to continuously ambulate throughout the 6  mins.  FUNCTIONAL TESTS:  See Goal assessment below.       PATIENT SURVEYS:  FOTO 53; 9/17 53  TODAY'S TREATMENT:                                                                                                                              DATE: 10/15/22   BP taken at start of treatment session.  136/68 HR 69  Nustep reciprocal endurance training level 1-3 x 3 min +2.5 min following therapeutic rest break.   Sit<>stand 2 x 8 with cues for increased speed.  Standing step over airex beam x 10 bil  Standing on airex beam 2 x 30 sec  Lateral stepping on airex beam2x 10 bil  Lateral step over beam 2x 8 bil   Pt performed seated PWR up x 10 seated PWR twist x 10 bil   Cues for full ROM and hold in open position x 3 sec for each rep.   PATIENT EDUCATION: Education details: Educated on instruction for tests and measures, outcome measures, and POC. Pt educated throughout session about proper posture and technique with exercises. Improved exercise technique, movement at target joints, use of target  muscles after min to mod verbal, visual, tactile cues.  Person educated: Patient Education method: Medical illustrator Education comprehension: verbalized understanding  HOME EXERCISE PROGRAM: Access Code: [facility] [email or web address]  Date: 07/17/2022 Prepared by: Grier Rocher Exercises - Standing Single Leg  Stance with Counter Support  - 1 x daily - 7 x weekly - 3 sets - 10 reps - 2 hold -  Seated Hip Abduction with Resistance  - 1 x daily - 7 x weekly - 3 sets - 10 reps -  Seated Long Arc Quad  - 1 x daily - 7 x weekly - 3 sets - 10 reps - 3 hold   Access Code: [email or web address] URL: [email or web address] Date: 08/19/2022 Prepared by: Grier Rocher  Exercises - Sit to Stand with Armchair  - 1 x daily - 7 x weekly - 3 sets - 8 reps - Side Stepping with Counter Support  - 1 x daily - 7 x weekly - 3 sets - 8 reps  GOALS: Goals reviewed with  patient? No  SHORT TERM GOALS: Target date: 08/12/2022    Patient will be independent in home exercise program to improve strength/mobility for better functional independence with ADLs. Baseline: provided  Goal status:  Progressing   LONG TERM GOALS: Target date: 10/07/2022   1.  Patient (> 29 years old) will complete five times sit to stand test in < 15 seconds indicating an increased LE strength and improved balance. Baseline: 23.99 sec 8/6: 14.81 9/17: 13.35sec  Goal status: MET   2.  Patient will increase FOTO score to equal to or greater than  59  to demonstrate statistically significant improvement in mobility and quality of life.  Baseline: 53 8/6 59 Goal status: in progress   3.  Patient will increase Berg Balance score by > 6 points to demonstrate decreased fall risk during functional activities. Baseline: Assessed 2nd session: 40 9/17: 45 Goal status:  in progress   4.   Patient will reduce timed up and go to <11 seconds to reduce fall risk and demonstrate improved transfer/gait ability. Baseline: 14.38 8/6: 11.97 9/17 11.88 sec  Goal status:  in progress  5.   Patient will increase 10 meter walk test to >1.20m/s as to improve gait speed for better community ambulation and to reduce fall risk. Baseline: 0.88 m/s 8/6: 9.34, 9.89 (1.62m/s) 9/17: 9.69sec Goal status: Met  6.   Patient will increase six minute walk test distance to >1000 for progression to community ambulator and improve gait ability Baseline: 937 feet 9/17: 1261ft Goal status: MET     ASSESSMENT:  CLINICAL IMPRESSION:   Patient presents to PT with no pain on this day. Pt performed standing and seated large amplitude movements to address balance deficits and bradykinesia from PD. Pt required CGA and cues for step width intermittently as well as full shoulder ROM into extension/abduction.  Pt will continue to benefit from skilled physical therapy intervention to address impairments, improve QOL,  and attain therapy goals.   OBJECTIVE IMPAIRMENTS: decreased balance, decreased endurance, decreased strength, and Abnormal Gait; R side drift .   ACTIVITY LIMITATIONS: bending, standing, and locomotion level  PARTICIPATION LIMITATIONS: meal prep, cleaning, and community activity  PERSONAL FACTORS: Age, Fitness, and Past/current experiences are also affecting patient's functional outcome.   REHAB POTENTIAL: Good  CLINICAL DECISION MAKING: Stable/uncomplicated  EVALUATION COMPLEXITY: Low  PLAN:  PT FREQUENCY: 1-2x/week  PT DURATION: 3 weeks  PLANNED INTERVENTIONS: Therapeutic exercises, Therapeutic activity, Neuromuscular re-education, Balance training, Gait training, Patient/Family education,  Self Care, Joint mobilization, Vestibular training, Cryotherapy, Moist heat, and Re-evaluation  PLAN FOR NEXT SESSION:   Continue to Review HEP.  Dymamic balance and weighted gait.    Golden Pop, PT, DPT 10/15/2022, 11:38 AM    11:38 AM 10/15/22

## 2022-10-22 ENCOUNTER — Ambulatory Visit: Payer: Medicare Other | Attending: Neurology | Admitting: Physical Therapy

## 2022-10-22 DIAGNOSIS — G20A1 Parkinson's disease without dyskinesia, without mention of fluctuations: Secondary | ICD-10-CM | POA: Diagnosis present

## 2022-10-22 DIAGNOSIS — R2689 Other abnormalities of gait and mobility: Secondary | ICD-10-CM | POA: Insufficient documentation

## 2022-10-22 DIAGNOSIS — R2681 Unsteadiness on feet: Secondary | ICD-10-CM | POA: Diagnosis present

## 2022-10-22 DIAGNOSIS — M6281 Muscle weakness (generalized): Secondary | ICD-10-CM | POA: Insufficient documentation

## 2022-10-22 NOTE — Therapy (Signed)
OUTPATIENT PHYSICAL THERAPY NEURO TREATMENT NOTE/ Re-certification    Patient Name: Katelyn Crawford MRN: [id number] DOB:1941-01-06, 82 y.o., female Today's Date: 10/22/2022   PCP:   Marguarite Arbour, MD   REFERRING PROVIDER: Lonell Face, MD   END OF SESSION:  PT End of Session - 10/22/22 0932     Visit Number 18    Number of Visits 24    Date for PT Re-Evaluation 10/29/22    Progress Note Due on Visit 20    PT Start Time 0930    PT Stop Time 1011    PT Time Calculation (min) 41 min    Equipment Utilized During Treatment Gait belt    Activity Tolerance Patient tolerated treatment well    Behavior During Therapy WFL for tasks assessed/performed                     Past Medical History:  Diagnosis Date   Anemia    Aortic atherosclerosis (HCC)    Arthritis    Cardiomegaly    Cardiomyopathy (HCC)    Cholelithiasis    Chronic kidney disease, stage 2 (mild)    Chronic sinus bradycardia    Coronary artery disease 03/10/2012   a.) LHC 03/10/2012: 40% mLAD, 50% pRI, 50% pRCA, 90% mRCA, 50% dRCA, 50% RPDA - unable to cannulate RCA - med mgmt; b.) LHC/PCI 06/25/2013: 30% pLAD, 40% mLAD, 20% mLCx, 30% RI, 30% pRCA, 70% mRCA (2.5 mm Promus DES), 40% dRCA-1, 90% dRCA-2 (2.5 mm Promus DES)   DDD (degenerative disc disease), thoracolumbar    Diverticulosis    Dizziness    Environmental allergies    Full dentures    GERD (gastroesophageal reflux disease) 06/25/2013   H/O bilateral cataract extraction 2019   History of diverticulosis    History of kidney stones    HLD (hyperlipidemia)    Hyperlipidemia, unspecified    Hypertension    Nephrolithiasis    Osteoporosis, post-menopausal    Parkinson disease    Pre-diabetes    PVC's (premature ventricular contractions)    Scoliosis    Thoracic kyphosis    Trifascicular bundle branch block    a.) 1st degree AVB + RBBB + LAFB   Vascular disease    Past Surgical History:  Procedure Laterality Date   APPENDECTOMY      BREAST EXCISIONAL BIOPSY Left 1984   neg   BREAST EXCISIONAL BIOPSY Right 1997   lipoma   CATARACT EXTRACTION W/PHACO Left 09/02/2017   Procedure: CATARACT EXTRACTION PHACO AND INTRAOCULAR LENS PLACEMENT (IOC)  LEFT;  Surgeon: Lockie Mola, MD;  Location: [facility];  Service: Ophthalmology;  Laterality: Left;   CATARACT EXTRACTION W/PHACO Right 10/01/2017   Procedure: CATARACT EXTRACTION PHACO AND INTRAOCULAR LENS PLACEMENT (IOC) RIGHT;  Surgeon: Lockie Mola, MD;  Location: [facility];  Service: Ophthalmology;  Laterality: Right;   COLONOSCOPY WITH PROPOFOL N/A 11/27/2016   Procedure: COLONOSCOPY WITH PROPOFOL;  Surgeon: Toledo, Boykin Nearing, MD;  Location: [facility];  Service: Gastroenterology;  Laterality: N/A;   CORONARY ANGIOPLASTY WITH STENT PLACEMENT Left 06/25/2013   Procedure: CORONARY ANGIOPLASTY WITH STENT PLACEMENT; Location: [facility]; Surgeon: Rolly Salter, MD   KNEE ARTHROPLASTY Left 01/22/2017   Procedure: COMPUTER ASSISTED TOTAL KNEE ARTHROPLASTY;  Surgeon: Donato Heinz, MD;  Location: [facility];  Service: Orthopedics;  Laterality: Left;   KNEE ARTHROSCOPY Left 02/01/2015   Procedure: ARTHROSCOPY KNEE, partial medial & lateral menisectomy,,medial and patelofemoral chondroplasty;  Surgeon: Donato Heinz, MD;  Location:  [facility];  Service: Orthopedics;  Laterality: Left;   LEFT HEART CATH AND CORONARY ANGIOGRAPHY Left 03/10/2012   Procedure: LEFT HEART CATH AND CORONARY ANGIOGRAPHY; Location: [facility]; Surgeon: Marcina Millard, MD   LITHOTRIPSY Right 1995   RIGHT AXILLARY LIPOMA REMOVAL     TUBAL LIGATION     VARICOSE VEIN SURGERY     Patient Active Problem List   Diagnosis Date Noted   Primary osteoarthritis of hip 02/09/2017   Primary osteoarthritis of knee 02/09/2017   Chronic sinus bradycardia 01/22/2017   Degenerative disc disease, lumbar 01/22/2017   Dizziness 01/22/2017   Nephrolithiasis 01/22/2017   Osteoporosis,  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/22/2017   PVC's (premature ventricular contractions) 01/22/2017   S/P total knee arthroplasty 01/22/2017   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   CKD (chronic kidney disease), 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   GERD (gastroesophageal reflux disease) 06/25/2013   Coronary artery disease involving native coronary artery of native heart 06/24/2013   Essential hypertension 06/21/2013   Mixed hyperlipidemia 06/21/2013    ONSET DATE: March 2019  REFERRING DIAG: Parkinson's Disease  THERAPY DIAG:  Parkinson's disease, unspecified whether dyskinesia present, unspecified whether manifestations fluctuate (HCC)  Muscle weakness (generalized)  Other abnormalities of gait and mobility  Unsteadiness on feet  Rationale for Evaluation and Treatment: Rehabilitation  SUBJECTIVE:                                                                                                                                                                                             SUBJECTIVE STATEMENT:     Pt reports that she is doing well today. Had a good weekend, but is feeling a little out of it with mild elevated BP prior to session at home with systolic BP ~140. No pain or medical update on this day.   Pt accompanied by: self  PERTINENT HISTORY:   Patient reporting to physical therapy for general unsteadiness and weakness as it relates to Parkinson's disease. Has been seen in the past for PT in 2019. PMH includes: Parkinson's disease, Vascular disease, Dizziness, DDD, GERD, arthritis, CKD, CAD. Reports no falls in the past 6 months. Is currently medicated for Parkinson's; taking medication 4x a day. She notices her biggest struggle is doing activities for longer durations of time. She also has concerns of feeling off balance when reacting quickly to environment.   PAIN:  Are you having pain? No and Yes: NPRS scale: 5/10 Pain location: back Pain description: burn Aggravating factors:    Relieving factors:    PRECAUTIONS: None  WEIGHT BEARING RESTRICTIONS: No  FALLS: Has patient fallen in last 6 months? No  LIVING ENVIRONMENT:  Lives with: lives alone and daughter lives next door. Lives in: House/apartment Stairs: Yes: External: 4 steps; on right going up and on left going up Has following equipment at home:  None, but has use cane in the past. Has shower rails, shower chair; doesn't use  PLOF: Independent with basic ADLs, Independent with household mobility without device, Independent with gait, and Independent with transfers  PATIENT GOALS: Get stronger, be able to stand long enough to cook lunch/ meals  OBJECTIVE:   DIAGNOSTIC FINDINGS:   MRI THORACIC SPINE WITHOUT CONTRAST: IMPRESSION: 1. Scoliosis of the thoracic spine is convex to the left at T3. 2. Exaggerated thoracic kyphosis is stable 3. Mild right foraminal narrowing at T2-3 and T3-4. 4. Mild disc bulging and facet hypertrophy at T6-7, T7-8, T10-11, and T11-12 without significant stenosis. 5. Chronic fatty endplate marrow changes across the endplates T4-5 through T11-12 with the exception of T6-7 and T7-8 which changes are more edematous.  CHEST - 2 VIEW :IMPRESSION: No active cardiopulmonary disease.  COGNITION: Overall cognitive status: Within functional limits for tasks assessed    POSTURE: rounded shoulders, forward head, and increased thoracic kyphosis    LOWER EXTREMITY MMT:    MMT Right Eval Left Eval  Hip flexion 4- 4  Hip extension    Hip abduction 4 4  Hip adduction 4- 4-  Hip internal rotation    Hip external rotation    Knee flexion 4- 4-  Knee extension 4- 4  Ankle dorsiflexion 4- 4-  Ankle plantarflexion    Ankle inversion    Ankle eversion    (Blank rows = not tested)   GAIT: Gait pattern:  Pt ability to walk in straight line diminished as progressed, primarily drifting to R side,  and decreased stride length Distance walked: 937 ft Assistive device  utilized: None Level of assistance: CGA Comments: Pt walking at slow pace, though able to continuously ambulate throughout the 6 mins.  FUNCTIONAL TESTS:  See Goal assessment below.       PATIENT SURVEYS:  FOTO 53; 9/17 53  TODAY'S TREATMENT:                                                                                                                              DATE: 10/22/22   BP taken at start of treatment session.  136/68 HR 69  Nustep reciprocal endurance training level 2-3 x 5 min no rest break required on this day.   Sit<>stand 2 x 8 with cues for increased speed.  Standing step over airex beam x 10 bil  Standing on airex beam 2 x 30 sec  Lateral stepping on airex beam2x 10 bil  Lateral step over beam 2x 8 bil   HEP review:  Pt performed seated PWR up x 10  seated PWR twist x 10 bil  Seated PWR rock x 10 bil  Standing PWR! Up x 8  Standing PWR Step x 8  bil  Weighted gait with 2.5# ankle weights x 432ft. Cues for reciprocal arm swing and improved posture with fatigue.  Up and go 4ft each direction x 3 with emphasis on increased speed and step length.   Side stepping with 2.5 # ankle weights 57ft x 2 bil    Cues for full ROM and hold in open position x 3 sec for each rep.   PATIENT EDUCATION: Education details: Educated on instruction for tests and measures, outcome measures, and POC. Pt educated throughout session about proper posture and technique with exercises. Improved exercise technique, movement at target joints, use of target muscles after min to mod verbal, visual, tactile cues.  Person educated: Patient Education method: Medical illustrator Education comprehension: verbalized understanding  HOME EXERCISE PROGRAM: Access Code: [email or web address]  Date: 07/17/2022 Prepared by: Grier Rocher Exercises - Standing Single Leg  Stance with Counter Support  - 1 x daily - 7 x weekly - 3 sets - 10 reps - 2 hold -   Seated Hip Abduction with Resistance  - 1 x daily - 7 x weekly - 3 sets - 10 reps -  Seated Long Arc Quad  - 1 x daily - 7 x weekly - 3 sets - 10 reps - 3 hold   Access Code: [email or web address] URL: [email or web address] Date: 08/19/2022 Prepared by: Grier Rocher  Exercises - Sit to Stand with Armchair  - 1 x daily - 7 x weekly - 3 sets - 8 reps - Side Stepping with Counter Support  - 1 x daily - 7 x weekly - 3 sets - 8 reps  GOALS: Goals reviewed with patient? No  SHORT TERM GOALS: Target date: 08/12/2022    Patient will be independent in home exercise program to improve strength/mobility for better functional independence with ADLs. Baseline: provided  Goal status:  Progressing   LONG TERM GOALS: Target date: 10/07/2022   1.  Patient (> 67 years old) will complete five times sit to stand test in < 15 seconds indicating an increased LE strength and improved balance. Baseline: 23.99 sec 8/6: 14.81 9/17: 13.35sec  Goal status: MET   2.  Patient will increase FOTO score to equal to or greater than  59  to demonstrate statistically significant improvement in mobility and quality of life.  Baseline: 53 8/6 59 Goal status: in progress   3.  Patient will increase Berg Balance score by > 6 points to demonstrate decreased fall risk during functional activities. Baseline: Assessed 2nd session: 40 9/17: 45 Goal status:  in progress   4.   Patient will reduce timed up and go to <11 seconds to reduce fall risk and demonstrate improved transfer/gait ability. Baseline: 14.38 8/6: 11.97 9/17 11.88 sec  Goal status:  in progress  5.   Patient will increase 10 meter walk test to >1.42m/s as to improve gait speed for better community ambulation and to reduce fall risk. Baseline: 0.88 m/s 8/6: 9.34, 9.89 (1.39m/s) 9/17: 9.69sec Goal status: Met  6.   Patient will increase six minute walk test distance to >1000 for progression to community ambulator and improve gait  ability Baseline: 937 feet 9/17: 1263ft Goal status: MET     ASSESSMENT:  CLINICAL IMPRESSION:   Patient presents to PT with no pain on this day. Pt continued to be educated in standing and seated large amplitude movements to address balance deficits and bradykinesia from PD. Decreased assist with no LOB noted on this day, but continues to require max visual cues for proper  technique with PWR! HEP.  Pt will continue to benefit from skilled physical therapy intervention to address impairments, improve QOL, and attain therapy goals.   OBJECTIVE IMPAIRMENTS: decreased balance, decreased endurance, decreased strength, and Abnormal Gait; R side drift .   ACTIVITY LIMITATIONS: bending, standing, and locomotion level  PARTICIPATION LIMITATIONS: meal prep, cleaning, and community activity  PERSONAL FACTORS: Age, Fitness, and Past/current experiences are also affecting patient's functional outcome.   REHAB POTENTIAL: Good  CLINICAL DECISION MAKING: Stable/uncomplicated  EVALUATION COMPLEXITY: Low  PLAN:  PT FREQUENCY: 1-2x/week  PT DURATION: 3 weeks  PLANNED INTERVENTIONS: Therapeutic exercises, Therapeutic activity, Neuromuscular re-education, Balance training, Gait training, Patient/Family education, Self Care, Joint mobilization, Vestibular training, Cryotherapy, Moist heat, and Re-evaluation  PLAN FOR NEXT SESSION:   Continue to Review HEP.  Dymamic balance and weighted gait.   Education for d/c in near future  Golden Pop, PT, DPT 10/22/2022, 9:33 AM    9:33 AM 10/22/22

## 2022-10-24 ENCOUNTER — Ambulatory Visit: Payer: Medicare Other | Admitting: Physical Therapy

## 2022-10-24 DIAGNOSIS — R2681 Unsteadiness on feet: Secondary | ICD-10-CM

## 2022-10-24 DIAGNOSIS — G20A1 Parkinson's disease without dyskinesia, without mention of fluctuations: Secondary | ICD-10-CM | POA: Diagnosis not present

## 2022-10-24 DIAGNOSIS — M6281 Muscle weakness (generalized): Secondary | ICD-10-CM

## 2022-10-24 DIAGNOSIS — R2689 Other abnormalities of gait and mobility: Secondary | ICD-10-CM

## 2022-10-24 NOTE — Therapy (Signed)
OUTPATIENT PHYSICAL THERAPY NEURO TREATMENT NOTE/ Re-certification    Patient Name: Katelyn Crawford MRN: [id number] DOB:Jun 11, 1940, 82 y.o., female Today's Date: 10/24/2022   PCP:   Marguarite Arbour, MD   REFERRING PROVIDER: Lonell Face, MD   END OF SESSION:  PT End of Session - 10/24/22 1015     Visit Number 19    Number of Visits 24    Date for PT Re-Evaluation 10/29/22    Progress Note Due on Visit 20    PT Start Time 1016    PT Stop Time 1056    PT Time Calculation (min) 40 min    Equipment Utilized During Treatment Gait belt    Activity Tolerance Patient tolerated treatment well    Behavior During Therapy WFL for tasks assessed/performed                     Past Medical History:  Diagnosis Date   Anemia    Aortic atherosclerosis (HCC)    Arthritis    Cardiomegaly    Cardiomyopathy (HCC)    Cholelithiasis    Chronic kidney disease, stage 2 (mild)    Chronic sinus bradycardia    Coronary artery disease 03/10/2012   a.) LHC 03/10/2012: 40% mLAD, 50% pRI, 50% pRCA, 90% mRCA, 50% dRCA, 50% RPDA - unable to cannulate RCA - med mgmt; b.) LHC/PCI 06/25/2013: 30% pLAD, 40% mLAD, 20% mLCx, 30% RI, 30% pRCA, 70% mRCA (2.5 mm Promus DES), 40% dRCA-1, 90% dRCA-2 (2.5 mm Promus DES)   DDD (degenerative disc disease), thoracolumbar    Diverticulosis    Dizziness    Environmental allergies    Full dentures    GERD (gastroesophageal reflux disease) 06/25/2013   H/O bilateral cataract extraction 2019   History of diverticulosis    History of kidney stones    HLD (hyperlipidemia)    Hyperlipidemia, unspecified    Hypertension    Nephrolithiasis    Osteoporosis, post-menopausal    Parkinson disease    Pre-diabetes    PVC's (premature ventricular contractions)    Scoliosis    Thoracic kyphosis    Trifascicular bundle branch block    a.) 1st degree AVB + RBBB + LAFB   Vascular disease    Past Surgical History:  Procedure Laterality Date   APPENDECTOMY      BREAST EXCISIONAL BIOPSY Left 1984   neg   BREAST EXCISIONAL BIOPSY Right 1997   lipoma   CATARACT EXTRACTION W/PHACO Left 09/02/2017   Procedure: CATARACT EXTRACTION PHACO AND INTRAOCULAR LENS PLACEMENT (IOC)  LEFT;  Surgeon: Lockie Mola, MD;  Location: [facility];  Service: Ophthalmology;  Laterality: Left;   CATARACT EXTRACTION W/PHACO Right 10/01/2017   Procedure: CATARACT EXTRACTION PHACO AND INTRAOCULAR LENS PLACEMENT (IOC) RIGHT;  Surgeon: Lockie Mola, MD;  Location: [facility];  Service: Ophthalmology;  Laterality: Right;   COLONOSCOPY WITH PROPOFOL N/A 11/27/2016   Procedure: COLONOSCOPY WITH PROPOFOL;  Surgeon: Toledo, Boykin Nearing, MD;  Location: [facility];  Service: Gastroenterology;  Laterality: N/A;   CORONARY ANGIOPLASTY WITH STENT PLACEMENT Left 06/25/2013   Procedure: CORONARY ANGIOPLASTY WITH STENT PLACEMENT; Location: [facility]; Surgeon: Rolly Salter, MD   KNEE ARTHROPLASTY Left 01/22/2017   Procedure: COMPUTER ASSISTED TOTAL KNEE ARTHROPLASTY;  Surgeon: Donato Heinz, MD;  Location: [facility];  Service: Orthopedics;  Laterality: Left;   KNEE ARTHROSCOPY Left 02/01/2015   Procedure: ARTHROSCOPY KNEE, partial medial & lateral menisectomy,,medial and patelofemoral chondroplasty;  Surgeon: Donato Heinz, MD;  Location:  [facility];  Service: Orthopedics;  Laterality: Left;   LEFT HEART CATH AND CORONARY ANGIOGRAPHY Left 03/10/2012   Procedure: LEFT HEART CATH AND CORONARY ANGIOGRAPHY; Location: [facility]; Surgeon: Marcina Millard, MD   LITHOTRIPSY Right 1995   RIGHT AXILLARY LIPOMA REMOVAL     TUBAL LIGATION     VARICOSE VEIN SURGERY     Patient Active Problem List   Diagnosis Date Noted   Primary osteoarthritis of hip 02/09/2017   Primary osteoarthritis of knee 02/09/2017   Chronic sinus bradycardia 01/22/2017   Degenerative disc disease, lumbar 01/22/2017   Dizziness 01/22/2017   Nephrolithiasis 01/22/2017   Osteoporosis,  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/22/2017   PVC's (premature ventricular contractions) 01/22/2017   S/P total knee arthroplasty 01/22/2017   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   CKD (chronic kidney disease), 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   GERD (gastroesophageal reflux disease) 06/25/2013   Coronary artery disease involving native coronary artery of native heart 06/24/2013   Essential hypertension 06/21/2013   Mixed hyperlipidemia 06/21/2013    ONSET DATE: March 2019  REFERRING DIAG: Parkinson's Disease  THERAPY DIAG:  Parkinson's disease, unspecified whether dyskinesia present, unspecified whether manifestations fluctuate (HCC)  Muscle weakness (generalized)  Other abnormalities of gait and mobility  Unsteadiness on feet  Rationale for Evaluation and Treatment: Rehabilitation  SUBJECTIVE:                                                                                                                                                                                             SUBJECTIVE STATEMENT:     Pt reports that she had a good breakfast, but feels a little "Shakey" this morning. BP assessed and WNL. No other complaints.   Pt accompanied by: self  PERTINENT HISTORY:   Patient reporting to physical therapy for general unsteadiness and weakness as it relates to Parkinson's disease. Has been seen in the past for PT in 2019. PMH includes: Parkinson's disease, Vascular disease, Dizziness, DDD, GERD, arthritis, CKD, CAD. Reports no falls in the past 6 months. Is currently medicated for Parkinson's; taking medication 4x a day. She notices her biggest struggle is doing activities for longer durations of time. She also has concerns of feeling off balance when reacting quickly to environment.   PAIN:  Are you having pain? No and Yes: NPRS scale: 5/10 Pain location: back Pain description: burn Aggravating factors:   Relieving factors:    PRECAUTIONS: None  WEIGHT BEARING RESTRICTIONS:  No  FALLS: Has patient fallen in last 6 months? No  LIVING ENVIRONMENT: Lives with: lives alone and daughter lives next door. Lives in: House/apartment Stairs: Yes: External: 4 steps; on right  going up and on left going up Has following equipment at home:  None, but has use cane in the past. Has shower rails, shower chair; doesn't use  PLOF: Independent with basic ADLs, Independent with household mobility without device, Independent with gait, and Independent with transfers  PATIENT GOALS: Get stronger, be able to stand long enough to cook lunch/ meals  OBJECTIVE:   DIAGNOSTIC FINDINGS:   MRI THORACIC SPINE WITHOUT CONTRAST: IMPRESSION: 1. Scoliosis of the thoracic spine is convex to the left at T3. 2. Exaggerated thoracic kyphosis is stable 3. Mild right foraminal narrowing at T2-3 and T3-4. 4. Mild disc bulging and facet hypertrophy at T6-7, T7-8, T10-11, and T11-12 without significant stenosis. 5. Chronic fatty endplate marrow changes across the endplates T4-5 through T11-12 with the exception of T6-7 and T7-8 which changes are more edematous.  CHEST - 2 VIEW :IMPRESSION: No active cardiopulmonary disease.  COGNITION: Overall cognitive status: Within functional limits for tasks assessed    POSTURE: rounded shoulders, forward head, and increased thoracic kyphosis    LOWER EXTREMITY MMT:    MMT Right Eval Left Eval  Hip flexion 4- 4  Hip extension    Hip abduction 4 4  Hip adduction 4- 4-  Hip internal rotation    Hip external rotation    Knee flexion 4- 4-  Knee extension 4- 4  Ankle dorsiflexion 4- 4-  Ankle plantarflexion    Ankle inversion    Ankle eversion    (Blank rows = not tested)   GAIT: Gait pattern:  Pt ability to walk in straight line diminished as progressed, primarily drifting to R side,  and decreased stride length Distance walked: 937 ft Assistive device utilized: None Level of assistance: CGA Comments: Pt walking at slow pace,  though able to continuously ambulate throughout the 6 mins.  FUNCTIONAL TESTS:  See Goal assessment below.       PATIENT SURVEYS:  FOTO 53; 9/17 53  TODAY'S TREATMENT:                                                                                                                              DATE: 10/24/22    Pt request BP assessment at start of session due to "shakey" feeling Sitting BP 126/66 HR 69 Standing: 127/66 HR 72  Nustep reciprocal endurance training level 2-3 x 7 min no rest break required on this day.   HEP review:  Pt performed seated PWR up x 10  seated PWR twist x 10 bil  Seated PWR rock x 10 bil  Standing PWR! Up x 8  Standing PWR Step x 8  bil   Improved independence with HEP on this date.   Dynamic gait training with no resistance:  Forward reverse 75ft x 4  Side stepping R and L 2  Semitandem stance 4 x 10 sec bil without UE support.   Throughout session PT provided only supervision assist and min cues for  posture and ROM through shoulders, was noted to have significantly improved step length in all directions    PATIENT EDUCATION: Education details: Educated on instruction for tests and measures, outcome measures, and POC. Pt educated throughout session about proper posture and technique with exercises. Improved exercise technique, movement at target joints, use of target muscles after min to mod verbal, visual, tactile cues.  Person educated: Patient Education method: Medical illustrator Education comprehension: verbalized understanding  HOME EXERCISE PROGRAM: Access Code: [facility]:[email or web address]  Date: 07/17/2022 Prepared by: Grier Rocher Exercises - Standing Single Leg  Stance with Counter Support  - 1 x daily - 7 x weekly - 3 sets - 10 reps - 2 hold -  Seated Hip Abduction with Resistance  - 1 x daily - 7 x weekly - 3 sets - 10 reps -  Seated Long Arc Quad  - 1 x daily - 7 x weekly - 3 sets - 10 reps - 3  hold   Access Code: [email or web address] URL: [email or web address] Date: 08/19/2022 Prepared by: Grier Rocher  Exercises - Sit to Stand with Armchair  - 1 x daily - 7 x weekly - 3 sets - 8 reps - Side Stepping with Counter Support  - 1 x daily - 7 x weekly - 3 sets - 8 reps  GOALS: Goals reviewed with patient? No  SHORT TERM GOALS: Target date: 08/12/2022    Patient will be independent in home exercise program to improve strength/mobility for better functional independence with ADLs. Baseline: provided  Goal status:  Progressing   LONG TERM GOALS: Target date: 10/07/2022   1.  Patient (> 54 years old) will complete five times sit to stand test in < 15 seconds indicating an increased LE strength and improved balance. Baseline: 23.99 sec 8/6: 14.81 9/17: 13.35sec  Goal status: MET   2.  Patient will increase FOTO score to equal to or greater than  59  to demonstrate statistically significant improvement in mobility and quality of life.  Baseline: 53 8/6 59 Goal status: in progress   3.  Patient will increase Berg Balance score by > 6 points to demonstrate decreased fall risk during functional activities. Baseline: Assessed 2nd session: 40 9/17: 45 Goal status:  in progress   4.   Patient will reduce timed up and go to <11 seconds to reduce fall risk and demonstrate improved transfer/gait ability. Baseline: 14.38 8/6: 11.97 9/17 11.88 sec  Goal status:  in progress  5.   Patient will increase 10 meter walk test to >1.31m/s as to improve gait speed for better community ambulation and to reduce fall risk. Baseline: 0.88 m/s 8/6: 9.34, 9.89 (1.18m/s) 9/17: 9.69sec Goal status: Met  6.   Patient will increase six minute walk test distance to >1000 for progression to community ambulator and improve gait ability Baseline: 937 feet 9/17: 1241ft Goal status: MET     ASSESSMENT:  CLINICAL IMPRESSION:   Patient presents to PT with no pain on this day. Pt continued  to be educated in standing and seated large amplitude movements to address balance deficits and bradykinesia from PD. Significantly improved consistency and technique with HEP as well as increased step length and balance in all planes requiring only supervision assist for safety throughout session. Pt will continue to benefit from skilled physical therapy intervention to address impairments, improve QOL, and attain therapy goals.   OBJECTIVE IMPAIRMENTS: decreased balance, decreased endurance, decreased strength, and Abnormal Gait; R side drift .   ACTIVITY LIMITATIONS: bending,  standing, and locomotion level  PARTICIPATION LIMITATIONS: meal prep, cleaning, and community activity  PERSONAL FACTORS: Age, Fitness, and Past/current experiences are also affecting patient's functional outcome.   REHAB POTENTIAL: Good  CLINICAL DECISION MAKING: Stable/uncomplicated  EVALUATION COMPLEXITY: Low  PLAN:  PT FREQUENCY: 1-2x/week  PT DURATION: 3 weeks  PLANNED INTERVENTIONS: Therapeutic exercises, Therapeutic activity, Neuromuscular re-education, Balance training, Gait training, Patient/Family education, Self Care, Joint mobilization, Vestibular training, Cryotherapy, Moist heat, and Re-evaluation  PLAN FOR NEXT SESSION:   Goal assessment for progress note.   Golden Pop, PT, DPT 10/24/2022, 10:16 AM    10:16 AM 10/24/22

## 2022-10-29 ENCOUNTER — Ambulatory Visit: Payer: Medicare Other | Admitting: Physical Therapy

## 2022-10-29 DIAGNOSIS — R2681 Unsteadiness on feet: Secondary | ICD-10-CM

## 2022-10-29 DIAGNOSIS — M6281 Muscle weakness (generalized): Secondary | ICD-10-CM

## 2022-10-29 DIAGNOSIS — G20A1 Parkinson's disease without dyskinesia, without mention of fluctuations: Secondary | ICD-10-CM

## 2022-10-29 DIAGNOSIS — R2689 Other abnormalities of gait and mobility: Secondary | ICD-10-CM

## 2022-10-29 NOTE — Therapy (Signed)
OUTPATIENT PHYSICAL THERAPY NEURO TREATMENT NOTE/ PHYSICAL THERAPY PROGRESS NOTE   Dates of reporting period  08/27/2022   to   10/29/2022     Patient Name: Katelyn Crawford MRN: [id number] DOB:1941/01/19, 82 y.o., female Today's Date: 10/29/2022   PCP:   Marguarite Arbour, MD   REFERRING PROVIDER: Lonell Face, MD   END OF SESSION:  PT End of Session - 10/29/22 0845     Visit Number 20    Number of Visits 24    Date for PT Re-Evaluation 10/29/22    Progress Note Due on Visit 20    PT Start Time 0846    PT Stop Time 0930    PT Time Calculation (min) 44 min    Equipment Utilized During Treatment Gait belt    Activity Tolerance Patient tolerated treatment well    Behavior During Therapy WFL for tasks assessed/performed                     Past Medical History:  Diagnosis Date   Anemia    Aortic atherosclerosis (HCC)    Arthritis    Cardiomegaly    Cardiomyopathy (HCC)    Cholelithiasis    Chronic kidney disease, stage 2 (mild)    Chronic sinus bradycardia    Coronary artery disease 03/10/2012   a.) LHC 03/10/2012: 40% mLAD, 50% pRI, 50% pRCA, 90% mRCA, 50% dRCA, 50% RPDA - unable to cannulate RCA - med mgmt; b.) LHC/PCI 06/25/2013: 30% pLAD, 40% mLAD, 20% mLCx, 30% RI, 30% pRCA, 70% mRCA (2.5 mm Promus DES), 40% dRCA-1, 90% dRCA-2 (2.5 mm Promus DES)   DDD (degenerative disc disease), thoracolumbar    Diverticulosis    Dizziness    Environmental allergies    Full dentures    GERD (gastroesophageal reflux disease) 06/25/2013   H/O bilateral cataract extraction 2019   History of diverticulosis    History of kidney stones    HLD (hyperlipidemia)    Hyperlipidemia, unspecified    Hypertension    Nephrolithiasis    Osteoporosis, post-menopausal    Parkinson disease    Pre-diabetes    PVC's (premature ventricular contractions)    Scoliosis    Thoracic kyphosis    Trifascicular bundle branch block    a.) 1st degree AVB + RBBB + LAFB   Vascular  disease    Past Surgical History:  Procedure Laterality Date   APPENDECTOMY     BREAST EXCISIONAL BIOPSY Left 1984   neg   BREAST EXCISIONAL BIOPSY Right 1997   lipoma   CATARACT EXTRACTION W/PHACO Left 09/02/2017   Procedure: CATARACT EXTRACTION PHACO AND INTRAOCULAR LENS PLACEMENT (IOC)  LEFT;  Surgeon: Lockie Mola, MD;  Location: [facility];  Service: Ophthalmology;  Laterality: Left;   CATARACT EXTRACTION W/PHACO Right 10/01/2017   Procedure: CATARACT EXTRACTION PHACO AND INTRAOCULAR LENS PLACEMENT (IOC) RIGHT;  Surgeon: Lockie Mola, MD;  Location: [facility];  Service: Ophthalmology;  Laterality: Right;   COLONOSCOPY WITH PROPOFOL N/A 11/27/2016   Procedure: COLONOSCOPY WITH PROPOFOL;  Surgeon: Toledo, Boykin Nearing, MD;  Location: [facility];  Service: Gastroenterology;  Laterality: N/A;   CORONARY ANGIOPLASTY WITH STENT PLACEMENT Left 06/25/2013   Procedure: CORONARY ANGIOPLASTY WITH STENT PLACEMENT; Location: [facility]; Surgeon: Rolly Salter, MD   KNEE ARTHROPLASTY Left 01/22/2017   Procedure: COMPUTER ASSISTED TOTAL KNEE ARTHROPLASTY;  Surgeon: Donato Heinz, MD;  Location: [facility];  Service: Orthopedics;  Laterality: Left;   KNEE ARTHROSCOPY Left 02/01/2015   Procedure:  ARTHROSCOPY KNEE, partial medial & lateral menisectomy,,medial and patelofemoral chondroplasty;  Surgeon: Donato Heinz, MD;  Location: [facility];  Service: Orthopedics;  Laterality: Left;   LEFT HEART CATH AND CORONARY ANGIOGRAPHY Left 03/10/2012   Procedure: LEFT HEART CATH AND CORONARY ANGIOGRAPHY; Location: [facility]; Surgeon: Marcina Millard, MD   LITHOTRIPSY Right 1995   RIGHT AXILLARY LIPOMA REMOVAL     TUBAL LIGATION     VARICOSE VEIN SURGERY     Patient Active Problem List   Diagnosis Date Noted   Primary osteoarthritis of hip 02/09/2017   Primary osteoarthritis of knee 02/09/2017   Chronic sinus bradycardia 01/22/2017   Degenerative disc disease, lumbar 01/22/2017    Dizziness 01/22/2017   Nephrolithiasis 01/22/2017   Osteoporosis, 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/22/2017   PVC's (premature ventricular contractions) 01/22/2017   S/P total knee arthroplasty 01/22/2017   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   CKD (chronic kidney disease), 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   GERD (gastroesophageal reflux disease) 06/25/2013   Coronary artery disease involving native coronary artery of native heart 06/24/2013   Essential hypertension 06/21/2013   Mixed hyperlipidemia 06/21/2013    ONSET DATE: March 2019  REFERRING DIAG: Parkinson's Disease  THERAPY DIAG:  Parkinson's disease, unspecified whether dyskinesia present, unspecified whether manifestations fluctuate (HCC)  Muscle weakness (generalized)  Other abnormalities of gait and mobility  Unsteadiness on feet  Rationale for Evaluation and Treatment: Rehabilitation  SUBJECTIVE:                                                                                                                                                                                             SUBJECTIVE STATEMENT:     Pt reports that she is feeling good this morning. Looking forward to PT assessment to see how she is progressing.   Pt accompanied by: self  PERTINENT HISTORY:   Patient reporting to physical therapy for general unsteadiness and weakness as it relates to Parkinson's disease. Has been seen in the past for PT in 2019. PMH includes: Parkinson's disease, Vascular disease, Dizziness, DDD, GERD, arthritis, CKD, CAD. Reports no falls in the past 6 months. Is currently medicated for Parkinson's; taking medication 4x a day. She notices her biggest struggle is doing activities for longer durations of time. She also has concerns of feeling off balance when reacting quickly to environment.   PAIN:  Are you having pain? No and Yes: NPRS scale: 5/10 Pain location: back Pain description: burn Aggravating factors:   Relieving factors:     PRECAUTIONS: None  WEIGHT BEARING RESTRICTIONS: No  FALLS: Has patient fallen in last 6 months? No  LIVING ENVIRONMENT: Lives with: lives  alone and daughter lives next door. Lives in: House/apartment Stairs: Yes: External: 4 steps; on right going up and on left going up Has following equipment at home:  None, but has use cane in the past. Has shower rails, shower chair; doesn't use  PLOF: Independent with basic ADLs, Independent with household mobility without device, Independent with gait, and Independent with transfers  PATIENT GOALS: Get stronger, be able to stand long enough to cook lunch/ meals  OBJECTIVE:   DIAGNOSTIC FINDINGS:   MRI THORACIC SPINE WITHOUT CONTRAST: IMPRESSION: 1. Scoliosis of the thoracic spine is convex to the left at T3. 2. Exaggerated thoracic kyphosis is stable 3. Mild right foraminal narrowing at T2-3 and T3-4. 4. Mild disc bulging and facet hypertrophy at T6-7, T7-8, T10-11, and T11-12 without significant stenosis. 5. Chronic fatty endplate marrow changes across the endplates T4-5 through T11-12 with the exception of T6-7 and T7-8 which changes are more edematous.  CHEST - 2 VIEW :IMPRESSION: No active cardiopulmonary disease.  COGNITION: Overall cognitive status: Within functional limits for tasks assessed    POSTURE: rounded shoulders, forward head, and increased thoracic kyphosis    LOWER EXTREMITY MMT:    MMT Right Eval Left Eval  Hip flexion 4- 4  Hip extension    Hip abduction 4 4  Hip adduction 4 4  Hip internal rotation    Hip external rotation    Knee flexion 4- 4-  Knee extension 4 4  Ankle dorsiflexion 4- 4-  Ankle plantarflexion    Ankle inversion    Ankle eversion    (Blank rows = not tested)   GAIT: Gait pattern:  Pt ability to walk in straight line diminished as progressed, primarily drifting to R side,  and decreased stride length Distance walked: 937 ft Assistive device utilized: None Level of  assistance: CGA Comments: Pt walking at slow pace, though able to continuously ambulate throughout the 6 mins.  FUNCTIONAL TESTS:  See Goal assessment below.       PATIENT SURVEYS:  FOTO 53; 9/17 53  TODAY'S TREATMENT:                                                                                                                              DATE: 10/29/22   Pt instructed in Progress note assessment:   Pt performed 5 time sit<>stand (5xSTS): 11.3, sec (11.3, 11.51, 11.10; >15 sec indicates increased fall risk)   6 Min Walk Test:  Instructed patient to ambulate as quickly and as safely as possible for 6 minutes using LRAD. Patient was allowed to take standing rest breaks without stopping the test, but if the patient required a sitting rest break the clock would be stopped and the test would be over.  Results: 1285 feet using no AD  with out assist. Results indicate that the patient has reduced endurance with ambulation compared to age matched norms.  Age Matched Norms: 10-69 yo M: 70 F: 538, 82-79  yo M: 27 F: 471, 58-89 yo M: 417 F: 392 MDC: 58.21 meters (190.98 feet) or 50 meters (ANPTA Core Set of Outcome Measures for Adults with Neurologic Conditions, 2018)  10 Meter Walk Test: Patient instructed to walk 10 meters (32.8 ft) as quickly and as safely as possible at their normal speed x2 and at a fast speed x2. Time measured from 2 meter mark to 8 meter mark to accommodate ramp-up and ramp-down.  Average Fast speed: 1.09 m/s Cut off scores: <0.4 m/s = household Ambulator, 0.4-0.8 m/s = limited community Ambulator, >0.8 m/s = community Ambulator, >1.2 m/s = crossing a street, <1.0 = increased fall risk MCID 0.05 m/s (small), 0.13 m/s (moderate), 0.06 m/s (significant)  (ANPTA Core Set of Outcome Measures for Adults with Neurologic Conditions, 2018)  Patient demonstrates increased fall risk as noted by score of   49/56 on Berg Balance Scale.  (<36= high risk for falls, close to 100%;  37-45 significant >80%; 46-51 moderate >50%; 52-55 lower >25%)  PT instructed pt in TUG: 10.4 sec (average of 3 trials; >13.5 sec indicates increased fall risk) Throughout session PT provided only supervision assist and min cues for posture, but improved UE swing with gait in 6 min walk test.    PATIENT EDUCATION: Education details: Educated on instruction for tests and measures, outcome measures, and  d/c recommendations .  Person educated: Patient Education method: Medical illustrator Education comprehension: verbalized understanding  HOME EXERCISE PROGRAM: Access Code: [facility] [email or web address]  Date: 07/17/2022 Prepared by: Grier Rocher Exercises - Standing Single Leg  Stance with Counter Support  - 1 x daily - 7 x weekly - 3 sets - 10 reps - 2 hold -  Seated Hip Abduction with Resistance  - 1 x daily - 7 x weekly - 3 sets - 10 reps -  Seated Long Arc Quad  - 1 x daily - 7 x weekly - 3 sets - 10 reps - 3 hold   Access Code: [email or web address] URL: [email or web address] Date: 08/19/2022 Prepared by: Grier Rocher  Exercises - Sit to Stand with Armchair  - 1 x daily - 7 x weekly - 3 sets - 8 reps - Side Stepping with Counter Support  - 1 x daily - 7 x weekly - 3 sets - 8 reps  GOALS: Goals reviewed with patient? No  SHORT TERM GOALS: Target date: 08/12/2022    Patient will be independent in home exercise program to improve strength/mobility for better functional independence with ADLs. Baseline: provided  Goal status:  MET   LONG TERM GOALS: Target date: 10/07/2022   1.  Patient (> 19 years old) will complete five times sit to stand test in < 15 seconds indicating an increased LE strength and improved balance. Baseline: 23.99 sec 8/6: 14.81 9/17: 13.35sec  10/8: 11.3 Goal status: MET   2.  Patient will increase FOTO score to equal to or greater than  59  to demonstrate statistically significant improvement in mobility and quality  of life.  Baseline: 53 8/6 59 10/8: 69  Goal status: MET   3.  Patient will increase Berg Balance score by > 6 points to demonstrate decreased fall risk during functional activities. Baseline: Assessed 2nd session: 40 9/17: 45  10/8: 49 Goal status:  MET   4.   Patient will reduce timed up and go to <11 seconds to reduce fall risk and demonstrate improved transfer/gait ability. Baseline: 14.38 8/6: 11.97 9/17 11.88 sec  10/8 10.4 sec  Goal status:  MET  5.   Patient will increase 10 meter walk test to >1.50m/s as to improve gait speed for better community ambulation and to reduce fall risk. Baseline: 0.88 m/s 8/6: 9.34, 9.89 (1.81m/s) 9/17: 9.69sec 10/8 1.60m/s (9.16 sec) Goal status: Met  6.   Patient will increase six minute walk test distance to >1000 for progression to community ambulator and improve gait ability Baseline: 937 feet 9/17: 1253ft 10/8: 1239ft  Goal status: MET     ASSESSMENT:  CLINICAL IMPRESSION:  PT treatment consisted of completion of standardized outcome measures for progress note assessment. Pt has met 6 of 6 LTGs demonstrating improved function and safety with ADLs and community mobility. Due to improved balance strength, endurance, and safety, pt will no longer required skilled PT at this time. Educated on importance of continued adherence to HEP and community mobility to maintain current functional status. Pt verbalized understanding.   OBJECTIVE IMPAIRMENTS: decreased balance, decreased endurance, decreased strength, and Abnormal Gait; R side drift .   ACTIVITY LIMITATIONS: bending, standing, and locomotion level  PARTICIPATION LIMITATIONS: meal prep, cleaning, and community activity  PERSONAL FACTORS: Age, Fitness, and Past/current experiences are also affecting patient's functional outcome.   REHAB POTENTIAL: Good  CLINICAL DECISION MAKING: Stable/uncomplicated  EVALUATION COMPLEXITY: Low  PLAN:  PT FREQUENCY: 1-2x/week  PT  DURATION: 3 weeks  PLANNED INTERVENTIONS: Therapeutic exercises, Therapeutic activity, Neuromuscular re-education, Balance training, Gait training, Patient/Family education, Self Care, Joint mobilization, Vestibular training, Cryotherapy, Moist heat, and Re-evaluation  PLAN FOR NEXT SESSION:   Pt D/c'ed from PT services.   Golden Pop, PT, DPT 10/29/2022, 8:46 AM    8:46 AM 10/29/22

## 2022-10-31 ENCOUNTER — Ambulatory Visit: Payer: Medicare Other | Admitting: Physical Therapy

## 2022-11-04 ENCOUNTER — Ambulatory Visit
Admission: RE | Admit: 2022-11-04 | Discharge: 2022-11-04 | Disposition: A | Discharge status: Home / Self Care | Payer: Medicare Other | Source: Ambulatory Visit | Attending: Internal Medicine | Admitting: Internal Medicine

## 2022-11-04 DIAGNOSIS — Z1231 Encounter for screening mammogram for malignant neoplasm of breast: Secondary | ICD-10-CM | POA: Diagnosis present

## 2022-11-05 ENCOUNTER — Ambulatory Visit: Payer: Medicare Other | Admitting: Physical Therapy

## 2022-11-07 ENCOUNTER — Ambulatory Visit: Payer: Medicare Other | Admitting: Physical Therapy

## 2022-11-11 ENCOUNTER — Ambulatory Visit: Payer: Medicare Other | Admitting: Physical Therapy

## 2022-12-11 ENCOUNTER — Ambulatory Visit: Payer: Medicare Other | Admitting: Physical Therapy

## 2022-12-17 ENCOUNTER — Ambulatory Visit: Payer: Medicare Other | Admitting: Physical Therapy

## 2023-01-08 ENCOUNTER — Ambulatory Visit
Admission: RE | Admit: 2023-01-08 | Discharge: 2023-01-08 | Disposition: A | Discharge status: Home / Self Care | Payer: Medicare Other | Source: Ambulatory Visit | Attending: Internal Medicine | Admitting: Internal Medicine

## 2023-01-08 ENCOUNTER — Other Ambulatory Visit: Payer: Self-pay | Admitting: Internal Medicine

## 2023-01-08 DIAGNOSIS — M79661 Pain in right lower leg: Secondary | ICD-10-CM | POA: Diagnosis present

## 2023-01-08 DIAGNOSIS — M7989 Other specified soft tissue disorders: Secondary | ICD-10-CM | POA: Diagnosis present

## 2023-02-17 ENCOUNTER — Encounter: Payer: Self-pay | Admitting: Physical Therapy

## 2023-02-17 ENCOUNTER — Ambulatory Visit: Payer: Medicare Other | Attending: Family Medicine | Admitting: Physical Therapy

## 2023-02-17 ENCOUNTER — Other Ambulatory Visit: Payer: Self-pay

## 2023-02-17 DIAGNOSIS — R2689 Other abnormalities of gait and mobility: Secondary | ICD-10-CM | POA: Diagnosis present

## 2023-02-17 DIAGNOSIS — G20A1 Parkinson's disease without dyskinesia, without mention of fluctuations: Secondary | ICD-10-CM | POA: Insufficient documentation

## 2023-02-17 DIAGNOSIS — M6281 Muscle weakness (generalized): Secondary | ICD-10-CM | POA: Diagnosis present

## 2023-02-17 DIAGNOSIS — R2681 Unsteadiness on feet: Secondary | ICD-10-CM | POA: Insufficient documentation

## 2023-02-17 DIAGNOSIS — M546 Pain in thoracic spine: Secondary | ICD-10-CM | POA: Diagnosis present

## 2023-02-17 DIAGNOSIS — G8929 Other chronic pain: Secondary | ICD-10-CM | POA: Diagnosis present

## 2023-02-17 NOTE — Therapy (Unsigned)
OUTPATIENT PHYSICAL THERAPY THORACOLUMBAR EVALUATION   Patient Name: NATARSHA HURWITZ MRN: [id number] DOB:1940-07-13, 83 y.o., female Today's Date: 02/17/2023  END OF SESSION:  PT End of Session - 02/17/23 1152     Visit Number 1    Number of Visits 24    Date for PT Re-Evaluation 05/12/23    PT Start Time 1150    PT Stop Time 1230    PT Time Calculation (min) 40 min    Activity Tolerance Patient tolerated treatment well    Behavior During Therapy WFL for tasks assessed/performed             Past Medical History:  Diagnosis Date   Anemia    Aortic atherosclerosis (HCC)    Arthritis    Cardiomegaly    Cardiomyopathy (HCC)    Cholelithiasis    Chronic kidney disease, stage 2 (mild)    Chronic sinus bradycardia    Coronary artery disease 03/10/2012   a.) LHC 03/10/2012: 40% mLAD, 50% pRI, 50% pRCA, 90% mRCA, 50% dRCA, 50% RPDA - unable to cannulate RCA - med mgmt; b.) LHC/PCI 06/25/2013: 30% pLAD, 40% mLAD, 20% mLCx, 30% RI, 30% pRCA, 70% mRCA (2.5 mm Promus DES), 40% dRCA-1, 90% dRCA-2 (2.5 mm Promus DES)   DDD (degenerative disc disease), thoracolumbar    Diverticulosis    Dizziness    Environmental allergies    Full dentures    GERD (gastroesophageal reflux disease) 06/25/2013   H/O bilateral cataract extraction 2019   History of diverticulosis    History of kidney stones    HLD (hyperlipidemia)    Hyperlipidemia, unspecified    Hypertension    Nephrolithiasis    Osteoporosis, post-menopausal    Parkinson disease (HCC)    Pre-diabetes    PVC's (premature ventricular contractions)    Scoliosis    Thoracic kyphosis    Trifascicular bundle branch block    a.) 1st degree AVB + RBBB + LAFB   Vascular disease    Past Surgical History:  Procedure Laterality Date   APPENDECTOMY     BREAST EXCISIONAL BIOPSY Left 1984   neg   BREAST EXCISIONAL BIOPSY Right 1997   lipoma   CATARACT EXTRACTION W/PHACO Left 09/02/2017   Procedure: CATARACT EXTRACTION PHACO AND  INTRAOCULAR LENS PLACEMENT (IOC)  LEFT;  Surgeon: Lockie Mola, MD;  Location: [facility];  Service: Ophthalmology;  Laterality: Left;   CATARACT EXTRACTION W/PHACO Right 10/01/2017   Procedure: CATARACT EXTRACTION PHACO AND INTRAOCULAR LENS PLACEMENT (IOC) RIGHT;  Surgeon: Lockie Mola, MD;  Location: [facility];  Service: Ophthalmology;  Laterality: Right;   COLONOSCOPY WITH PROPOFOL N/A 11/27/2016   Procedure: COLONOSCOPY WITH PROPOFOL;  Surgeon: Toledo, Boykin Nearing, MD;  Location: [facility];  Service: Gastroenterology;  Laterality: N/A;   CORONARY ANGIOPLASTY WITH STENT PLACEMENT Left 06/25/2013   Procedure: CORONARY ANGIOPLASTY WITH STENT PLACEMENT; Location: [facility]; Surgeon: Rolly Salter, MD   KNEE ARTHROPLASTY Left 01/22/2017   Procedure: COMPUTER ASSISTED TOTAL KNEE ARTHROPLASTY;  Surgeon: Donato Heinz, MD;  Location: [facility];  Service: Orthopedics;  Laterality: Left;   KNEE ARTHROSCOPY Left 02/01/2015   Procedure: ARTHROSCOPY KNEE, partial medial & lateral menisectomy,,medial and patelofemoral chondroplasty;  Surgeon: Donato Heinz, MD;  Location: [facility];  Service: Orthopedics;  Laterality: Left;   LEFT HEART CATH AND CORONARY ANGIOGRAPHY Left 03/10/2012   Procedure: LEFT HEART CATH AND CORONARY ANGIOGRAPHY; Location: [facility]; Surgeon: Marcina Millard, MD   LITHOTRIPSY Right 1995   RIGHT AXILLARY LIPOMA REMOVAL  TUBAL LIGATION     VARICOSE VEIN SURGERY     Patient Active Problem List   Diagnosis Date Noted   Primary osteoarthritis of hip 02/09/2017   Primary osteoarthritis of knee 02/09/2017   Chronic sinus bradycardia 01/22/2017   Degenerative disc disease, lumbar 01/22/2017   Dizziness 01/22/2017   Nephrolithiasis 01/22/2017   Osteoporosis, 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/22/2017   PVC's (premature ventricular contractions) 01/22/2017   S/P total knee arthroplasty 01/22/2017   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   CKD (chronic kidney  disease), 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   GERD (gastroesophageal reflux disease) 06/25/2013   Coronary artery disease involving native coronary artery of native heart 06/24/2013   Essential hypertension 06/21/2013   Mixed hyperlipidemia 06/21/2013    PCP: Marguarite Arbour, MD   REFERRING PROVIDER: Meeler, Jodelle Gross, FNP  REFERRING DIAG:  Diagnosis  M48.062 (ICD-10-CM) - Lumbar stenosis with neurogenic claudication  M54.14 (ICD-10-CM) - Thoracic radiculitis    Rationale for Evaluation and Treatment: Rehabilitation  THERAPY DIAG:  No diagnosis found.  ONSET DATE: >3 months   SUBJECTIVE:                                                                                                                                                                                           SUBJECTIVE STATEMENT: Reports that she feels like she has not been doing as well since d/c from PT. Also states that she her middle back has been really hurting with radiation in to legs and and arms. States that radiating pain is bilateral with symmetric referral pattern.   PERTINENT HISTORY:  Hx of PD and familiar to this clinic for PD management.  From recent MD appointment:  "The patient is a pleasant 83 year old female who presents today for acute on chronic bilateral low back pain without radiation to the lower extremities. She describes pain that began June 2020 without injury. She also states that she has had some form of back pain for many years. She describes her pain as mild to moderate that is sharp and intermittent. Bending and lifting increases her pain. Rest can help alleviate her pain. She participated in physical therapy at [facility] clinic in early 2020 with little relief. She continues to stay active in her home and notices that household activities increase her pain such as vacuuming and mopping. Medications include Tylenol (mild to moderate relief).  She was last evaluated on 10/09/2022 at which time she was  doing well. She was to continue with heat and home cycling along with physical therapy exercises. She was to continue with tramadol nightly as needed.  At today's visit she reports that she is  experience of increasing pain in the morning in her mid back about the bra line. She would like to return to physical therapy and she states that this was very helpful in managing her pain. We discussed if her pain was to continue consideration would be made for another epidural. She states that she is taking tramadol at night and doing well with this. Denies any injury. Upon palpation today she is without tenderness to the thoracic paraspinal musculature. "  PAIN:  Are you having pain? Yes: NPRS scale: 3/10 Pain location: middle back Pain description: ache Aggravating factors: sleeping Relieving factors: tramadol   PRECAUTIONS: Fall  RED FLAGS: None   WEIGHT BEARING RESTRICTIONS: No  FALLS:  Has patient fallen in last 6 months? No  LIVING ENVIRONMENT: Lives with: lives alone and daughter lives next door. Lives in: House/apartment Stairs: Yes: External: 4 steps; on right going up and on left going up Has following equipment at home:  None, but has use cane in the past. Has shower rails, shower chair; doesn't use  OCCUPATION: retired   PLOF: Independent, Independent with basic ADLs, and Independent with gait  PATIENT GOALS: stronger. Improve mobility   NEXT MD VISIT: unsure   OBJECTIVE:  Note: Objective measures were completed at Evaluation unless otherwise noted.  DIAGNOSTIC FINDINGS:  No recent imaging   PATIENT SURVEYS:  Modified Oswestry 28%   COGNITION: Overall cognitive status: Within functional limits for tasks assessed     SENSATION: WFL  MUSCLE LENGTH: WFL  POSTURE: rounded shoulders, forward head, and decreased lumbar lordosis  PALPATION: Noted tightness in upper thoracic paraspinal. No pain to palpation   LUMBAR ROM:   AROM eval  Flexion WFL  Extension 20   Right lateral flexion   Left lateral flexion   Right rotation minimal  Left rotation ~10 deg   Cervical  AROM eval  Flexion 45  Extension 18  Right lateral flexion 20  Left lateral flexion 12  Right rotation 50  Left rotation  40  Thoracic  AROM eval  Flexion WFL  Extension 10  Right lateral flexion 25  Left lateral flexion 20  Right rotation minimal  Left rotation ~10 deg      (Blank rows = not tested)  LOWER EXTREMITY ROM:     Grossly WFL  LOWER EXTREMITY MMT:    MMT Right eval Left eval  Hip flexion 4- 4-  Hip extension    Hip abduction 4- 4-  Hip adduction 4 4  Hip internal rotation    Hip external rotation    Knee flexion 4 4  Knee extension 4+ 4+  Ankle dorsiflexion 4 4  Ankle plantarflexion    Ankle inversion    Ankle eversion     (Blank rows = not tested)    FUNCTIONAL TESTS:  5 times sit to stand: 16  Timed up and go (TUG): 12.3 sce 6 minute walk test: TBD 10 meter walk test: 0.52m/s  GAIT: Distance walked: 70 Assistive device utilized: None Level of assistance: Complete Independence Comments: flexed posture.   TREATMENT DATE: 02/17/2023  Eval Only   PATIENT EDUCATION:  Education details: POC Person educated: Patient Education method: Explanation Education comprehension: verbalized understanding  HOME EXERCISE PROGRAM: To be given next session   ASSESSMENT:  CLINICAL IMPRESSION: Patient is a 83 y.o. female  who was seen today for physical therapy evaluation and treatment for mid back pain and balance decifits due to PD. Pt reports severe back pain at night that wakes her  up. Noted to have significantly reduced ROM in the L side with rotation and lateral flexion. Noted to have mildly worse scores on 5xSTS and and TUG compared to prior PT treatments at this clinic. Pt will benefit from PT to address back pain improve strength, and overall fucntional mobility   OBJECTIVE IMPAIRMENTS: Abnormal gait, cardiopulmonary status  limiting activity, decreased activity tolerance, decreased balance, decreased endurance, decreased mobility, difficulty walking, decreased ROM, decreased strength, hypomobility, increased fascial restrictions, impaired perceived functional ability, impaired flexibility, impaired UE functional use, improper body mechanics, and postural dysfunction.   ACTIVITY LIMITATIONS: carrying, squatting, sleeping, and locomotion level  PARTICIPATION LIMITATIONS: cleaning, laundry, shopping, and community activity  PERSONAL FACTORS: Age, Fitness, and 1-2 comorbidities: lumbar stenosis   are also affecting patient's functional outcome.   REHAB POTENTIAL: Good  CLINICAL DECISION MAKING: Stable/uncomplicated  EVALUATION COMPLEXITY: Low   GOALS: Goals reviewed with patient? Yes   SHORT TERM GOALS: Target date: 03/18/2023    Patient will be independent in home exercise program to improve strength/mobility for better functional independence with ADLs. Baseline: to be given at session 2.  Goal status: INITIAL   LONG TERM GOALS: Target date: 05/12/2023    Patient will increase Mod ODI score by 12.8 points  to demonstrate statistically significant improvement in mobility and quality of life.  Baseline: 28% Goal status: INITIAL  2.  Patient (> 70 years old) will complete five times sit to stand test in < 15 seconds indicating an increased LE strength and improved balance. Baseline: 16 sec  Goal status: INITIAL  3.  Patient will increase 6 min walk test score by >150 to demonstrated improved access to community and improve safety.   Baseline: To be tested next session  Goal status: INITIAL  4.  Patient will increase 10 meter walk test to >1.50m/s as to improve gait speed for better community ambulation and to reduce fall risk. Baseline: 0.39m/s  Goal status: INITIAL  5.  Patient will reduce timed up and go to <11 seconds to reduce fall risk and demonstrate improved transfer/gait  ability. Baseline: 13sec  Goal status: INITIAL  6.  Patient will reduced back pain to 3/10 at worst to indicate improved function with hold tasks.  Baseline: 9/10 at night.  Goal status: INITIAL  PLAN:  PT FREQUENCY: 1-2x/week  PT DURATION: 12 weeks  PLANNED INTERVENTIONS: 97110-Therapeutic exercises, 97530- Therapeutic activity, [id number]- Neuromuscular re-education, 97535- Self Care, [id number]- Manual therapy, [phone]- Gait training, [id number]- Electrical stimulation (unattended), [phone]- Electrical stimulation (manual), Balance training, Stair training, Taping, Dry Needling, Joint mobilization, Joint manipulation, Spinal manipulation, Spinal mobilization, Scar mobilization, DME instructions, Cryotherapy, and Moist heat.  PLAN FOR NEXT SESSION:  Complete 6 min walk test.  PD and middle back HEP.    Golden Pop, PT 02/17/2023, 11:58 AM

## 2023-02-20 ENCOUNTER — Ambulatory Visit: Payer: Medicare Other | Admitting: Physical Therapy

## 2023-02-20 DIAGNOSIS — M6281 Muscle weakness (generalized): Secondary | ICD-10-CM

## 2023-02-20 DIAGNOSIS — R2681 Unsteadiness on feet: Secondary | ICD-10-CM

## 2023-02-20 DIAGNOSIS — G20A1 Parkinson's disease without dyskinesia, without mention of fluctuations: Secondary | ICD-10-CM

## 2023-02-20 DIAGNOSIS — G8929 Other chronic pain: Secondary | ICD-10-CM

## 2023-02-20 DIAGNOSIS — R2689 Other abnormalities of gait and mobility: Secondary | ICD-10-CM

## 2023-02-20 NOTE — Therapy (Signed)
OUTPATIENT PHYSICAL THERAPY THORACOLUMBAR Treatment    Patient Name: Katelyn Crawford MRN: [id number] DOB:[birth year], 83 y.o., female Today's Date: 02/20/2023  END OF SESSION:  PT End of Session - 02/20/23 0923     Visit Number 2    Number of Visits 24    Date for PT Re-Evaluation 05/12/23    PT Start Time 0930    PT Stop Time 1010    PT Time Calculation (min) 40 min    Activity Tolerance Patient tolerated treatment well    Behavior During Therapy WFL for tasks assessed/performed             Past Medical History:  Diagnosis Date   Anemia    Aortic atherosclerosis (HCC)    Arthritis    Cardiomegaly    Cardiomyopathy (HCC)    Cholelithiasis    Chronic kidney disease, stage 2 (mild)    Chronic sinus bradycardia    Coronary artery disease 03/10/2012   a.) LHC 03/10/2012: 40% mLAD, 50% pRI, 50% pRCA, 90% mRCA, 50% dRCA, 50% RPDA - unable to cannulate RCA - med mgmt; b.) LHC/PCI 06/25/2013: 30% pLAD, 40% mLAD, 20% mLCx, 30% RI, 30% pRCA, 70% mRCA (2.5 mm Promus DES), 40% dRCA-1, 90% dRCA-2 (2.5 mm Promus DES)   DDD (degenerative disc disease), thoracolumbar    Diverticulosis    Dizziness    Environmental allergies    Full dentures    GERD (gastroesophageal reflux disease) 06/25/2013   H/O bilateral cataract extraction 2019   History of diverticulosis    History of kidney stones    HLD (hyperlipidemia)    Hyperlipidemia, unspecified    Hypertension    Nephrolithiasis    Osteoporosis, post-menopausal    Parkinson disease (HCC)    Pre-diabetes    PVC's (premature ventricular contractions)    Scoliosis    Thoracic kyphosis    Trifascicular bundle branch block    a.) 1st degree AVB + RBBB + LAFB   Vascular disease    Past Surgical History:  Procedure Laterality Date   APPENDECTOMY     BREAST EXCISIONAL BIOPSY Left 1984   neg   BREAST EXCISIONAL BIOPSY Right 1997   lipoma   CATARACT EXTRACTION W/PHACO Left 09/02/2017   Procedure: CATARACT EXTRACTION PHACO AND  INTRAOCULAR LENS PLACEMENT (IOC)  LEFT;  Surgeon: Lockie Mola, MD;  Location: [facility];  Service: Ophthalmology;  Laterality: Left;   CATARACT EXTRACTION W/PHACO Right 10/01/2017   Procedure: CATARACT EXTRACTION PHACO AND INTRAOCULAR LENS PLACEMENT (IOC) RIGHT;  Surgeon: Lockie Mola, MD;  Location: [facility];  Service: Ophthalmology;  Laterality: Right;   COLONOSCOPY WITH PROPOFOL N/A 11/27/2016   Procedure: COLONOSCOPY WITH PROPOFOL;  Surgeon: Toledo, Boykin Nearing, MD;  Location: [facility];  Service: Gastroenterology;  Laterality: N/A;   CORONARY ANGIOPLASTY WITH STENT PLACEMENT Left 06/25/2013   Procedure: CORONARY ANGIOPLASTY WITH STENT PLACEMENT; Location: [facility]; Surgeon: Rolly Salter, MD   KNEE ARTHROPLASTY Left 01/22/2017   Procedure: COMPUTER ASSISTED TOTAL KNEE ARTHROPLASTY;  Surgeon: Donato Heinz, MD;  Location: [facility];  Service: Orthopedics;  Laterality: Left;   KNEE ARTHROSCOPY Left 02/01/2015   Procedure: ARTHROSCOPY KNEE, partial medial & lateral menisectomy,,medial and patelofemoral chondroplasty;  Surgeon: Donato Heinz, MD;  Location: [facility];  Service: Orthopedics;  Laterality: Left;   LEFT HEART CATH AND CORONARY ANGIOGRAPHY Left 03/10/2012   Procedure: LEFT HEART CATH AND CORONARY ANGIOGRAPHY; Location: [facility]; Surgeon: Marcina Millard, MD   LITHOTRIPSY Right 1995   RIGHT AXILLARY LIPOMA REMOVAL  TUBAL LIGATION     VARICOSE VEIN SURGERY     Patient Active Problem List   Diagnosis Date Noted   Primary osteoarthritis of hip 02/09/2017   Primary osteoarthritis of knee 02/09/2017   Chronic sinus bradycardia 01/22/2017   Degenerative disc disease, lumbar 01/22/2017   Dizziness 01/22/2017   Nephrolithiasis 01/22/2017   Osteoporosis, 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/22/2017   PVC's (premature ventricular contractions) 01/22/2017   S/P total knee arthroplasty 01/22/2017   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   CKD (chronic kidney  disease), 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   GERD (gastroesophageal reflux disease) 06/25/2013   Coronary artery disease involving native coronary artery of native heart 06/24/2013   Essential hypertension 06/21/2013   Mixed hyperlipidemia 06/21/2013    PCP: Marguarite Arbour, MD   REFERRING PROVIDER: Meeler, Jodelle Gross, FNP  REFERRING DIAG:  Diagnosis  [phone] (ICD-10-CM) - Lumbar stenosis with neurogenic claudication  M54.14 (ICD-10-CM) - Thoracic radiculitis    Rationale for Evaluation and Treatment: Rehabilitation  THERAPY DIAG:  Parkinson's disease, unspecified whether dyskinesia present, unspecified whether manifestations fluctuate (HCC)  Muscle weakness (generalized)  Other abnormalities of gait and mobility  Unsteadiness on feet  Chronic bilateral thoracic back pain  ONSET DATE: >3 months   SUBJECTIVE:                                                                                                                                                                                           SUBJECTIVE STATEMENT:  Reports that she is feeling pretty good. States that she had a little pain this morning when she woke up, but it improved after getting up for a little bit  States that cleaning yesterday caused a lot pain, reports that seated rest helped reduce pain. Vacuuming elevated pain to 8/10.     PERTINENT HISTORY:  Hx of PD and familiar to this clinic for PD management.  From recent MD appointment:  "The patient is a pleasant 83 year old female who presents today for acute on chronic bilateral low back pain without radiation to the lower extremities. She describes pain that began June 2020 without injury. She also states that she has had some form of back pain for many years. She describes her pain as mild to moderate that is sharp and intermittent. Bending and lifting increases her pain. Rest can help alleviate her pain. She participated in physical therapy at [facility] in  early 2020 with little relief. She continues to stay active in her home and notices that household activities increase her pain such as vacuuming and mopping. Medications include Tylenol (mild to moderate relief).  She was last evaluated on 10/09/2022 at  which time she was doing well. She was to continue with heat and home cycling along with physical therapy exercises. She was to continue with tramadol nightly as needed.  At today's visit she reports that she is experience of increasing pain in the morning in her mid back about the bra line. She would like to return to physical therapy and she states that this was very helpful in managing her pain. We discussed if her pain was to continue consideration would be made for another epidural. She states that she is taking tramadol at night and doing well with this. Denies any injury. Upon palpation today she is without tenderness to the thoracic paraspinal musculature. "  PAIN:  Are you having pain? Yes: NPRS scale: 3/10 Pain location: middle back Pain description: ache Aggravating factors: sleeping Relieving factors: tramadol   PRECAUTIONS: Fall  RED FLAGS: None   WEIGHT BEARING RESTRICTIONS: No  FALLS:  Has patient fallen in last 6 months? No  LIVING ENVIRONMENT: Lives with: lives alone and daughter lives next door. Lives in: House/apartment Stairs: Yes: External: 4 steps; on right going up and on left going up Has following equipment at home:  None, but has use cane in the past. Has shower rails, shower chair; doesn't use  OCCUPATION: retired   PLOF: Independent, Independent with basic ADLs, and Independent with gait  PATIENT GOALS: stronger. Improve mobility   NEXT MD VISIT: unsure   OBJECTIVE:  Note: Objective measures were completed at Evaluation unless otherwise noted.  DIAGNOSTIC FINDINGS:  No recent imaging   PATIENT SURVEYS:  Modified Oswestry 28%   COGNITION: Overall cognitive status: Within functional limits for  tasks assessed     SENSATION: WFL  MUSCLE LENGTH: WFL  POSTURE: rounded shoulders, forward head, and decreased lumbar lordosis  PALPATION: Noted tightness in upper thoracic paraspinal. No pain to palpation   LUMBAR ROM:   AROM eval  Flexion WFL  Extension 20  Right lateral flexion   Left lateral flexion   Right rotation minimal  Left rotation ~10 deg   Cervical  AROM eval  Flexion 45  Extension 18  Right lateral flexion 20  Left lateral flexion 12  Right rotation 50  Left rotation  40  Thoracic  AROM eval  Flexion WFL  Extension 10  Right lateral flexion 25  Left lateral flexion 20  Right rotation minimal  Left rotation ~10 deg      (Blank rows = not tested)  LOWER EXTREMITY ROM:     Grossly WFL  LOWER EXTREMITY MMT:    MMT Right eval Left eval  Hip flexion 4- 4-  Hip extension    Hip abduction 4- 4-  Hip adduction 4 4  Hip internal rotation    Hip external rotation    Knee flexion 4 4  Knee extension 4+ 4+  Ankle dorsiflexion 4 4  Ankle plantarflexion    Ankle inversion    Ankle eversion     (Blank rows = not tested)    FUNCTIONAL TESTS:  5 times sit to stand: 16  Timed up and go (TUG): 12.3 sce 6 minute walk test: 115ft 10 meter walk test: 0.16m/s  GAIT: Distance walked: 70 Assistive device utilized: None Level of assistance: Complete Independence Comments: flexed posture.   TREATMENT DATE: 02/17/2023  Seated NMR:  BLE step with pelvic rotation x 10 bil  Trunk rotation to tap contralateral hand with scapular retraction following each bout x 10  Sit<>stand with UE swing into extension for  90deg flexion x 10    6 Min Walk Test:  Instructed patient to ambulate as quickly and as safely as possible for 6 minutes using LRAD. Patient was allowed to take standing rest breaks without stopping the test, but if the patient required a sitting rest break the clock would be stopped and the test would be over.  Results: 1128ft  using  no AD  with supervision assist. Results indicate that the patient has reduced endurance with ambulation compared to age matched norms.  Age Matched Norms: 48-69 yo M: 44 F: 40, 65-79 yo M: 25 F: 471, 42-89 yo M: 417 F: 392 MDC: 58.21 meters (190.98 feet) or 50 meters (ANPTA Core Set of Outcome Measures for Adults with Neurologic Conditions, 2018)  Standing therex:  Low row x 10 RTB  Mid row x 10 RTB   Seated therex:  Lumbar extension over half bolster x 10  Thoracic trunk extension over half bolster x 10  Shoulder flexion AROM palms forward x 10  Contralateral reach with 1 UE x 8 bil   PATIENT EDUCATION:  Education details: POC; HEP initiated.  Person educated: Patient Education method: Explanation Education comprehension: verbalized understanding  HOME EXERCISE PROGRAM: Access Code: [email or web address] URL: [email or web address] Date: 02/20/2023 Prepared by: Grier Rocher  Exercises - Sit to Stand with Arm Swing  - 1 x daily - 7 x weekly - 2 sets - 10 reps - 2 hold - Seated Reach Forward, Up, and To Sides  - 1 x daily - 7 x weekly - 3 sets - 10 reps - Seated Reaching to Side and Across Body  - 1 x daily - 7 x weekly - 3 sets - 10 reps - Standing Row with Anchored Resistance  - 1 x daily - 7 x weekly - 3 sets - 10 reps  ASSESSMENT:  CLINICAL IMPRESSION: Patient is a 83 y.o. female  who was seen today for physical  treatment for mid back pain and balance decifits due to PD. 6 min walk completed, indicating reduced community mobility and reduced distance from prior assessment with this PT. HEP initiated for improved posture and PD deficits to improve trunk rotation and extension. Tolerated all interventions well, with no reported increase in pain  Pt will benefit from PT to address back pain improve strength, and overall fucntional mobility   OBJECTIVE IMPAIRMENTS: Abnormal gait, cardiopulmonary status limiting activity, decreased activity tolerance, decreased balance, decreased  endurance, decreased mobility, difficulty walking, decreased ROM, decreased strength, hypomobility, increased fascial restrictions, impaired perceived functional ability, impaired flexibility, impaired UE functional use, improper body mechanics, and postural dysfunction.   ACTIVITY LIMITATIONS: carrying, squatting, sleeping, and locomotion level  PARTICIPATION LIMITATIONS: cleaning, laundry, shopping, and community activity  PERSONAL FACTORS: Age, Fitness, and 1-2 comorbidities: lumbar stenosis   are also affecting patient's functional outcome.   REHAB POTENTIAL: Good  CLINICAL DECISION MAKING: Stable/uncomplicated  EVALUATION COMPLEXITY: Low   GOALS: Goals reviewed with patient? Yes   SHORT TERM GOALS: Target date: 03/18/2023    Patient will be independent in home exercise program to improve strength/mobility for better functional independence with ADLs. Baseline: to be given at session 2.  Goal status: INITIAL   LONG TERM GOALS: Target date: 05/12/2023    Patient will increase Mod ODI score by 12.8 points  to demonstrate statistically significant improvement in mobility and quality of life.  Baseline: 28% Goal status: INITIAL  2.  Patient (> 53 years old) will complete five times sit to stand test in < 15  seconds indicating an increased LE strength and improved balance. Baseline: 16 sec  Goal status: INITIAL  3.  Patient will increase 6 min walk test score by >150 to demonstrated improved access to community and improve safety.   Baseline: 1124ft Goal status: INITIAL  4.  Patient will increase 10 meter walk test to >1.39m/s as to improve gait speed for better community ambulation and to reduce fall risk. Baseline: 0.60m/s  Goal status: INITIAL  5.  Patient will reduce timed up and go to <11 seconds to reduce fall risk and demonstrate improved transfer/gait ability. Baseline: 13sec  Goal status: INITIAL  6.  Patient will reduced back pain to 3/10 at worst to indicate  improved function with hold tasks.  Baseline: 9/10 at night.  Goal status: INITIAL  PLAN:  PT FREQUENCY: 1-2x/week  PT DURATION: 12 weeks  PLANNED INTERVENTIONS: 97110-Therapeutic exercises, 97530- Therapeutic activity, [id number]- Neuromuscular re-education, 97535- Self Care, [id number]- Manual therapy, [id number]- Gait training, [id number]- Electrical stimulation (unattended), [phone]- Electrical stimulation (manual), Balance training, Stair training, Taping, Dry Needling, Joint mobilization, Joint manipulation, Spinal manipulation, Spinal mobilization, Scar mobilization, DME instructions, Cryotherapy, and Moist heat.  PLAN FOR NEXT SESSION:   Continue to address trunk mobility deficits PD treatment.    Golden Pop, PT 02/20/2023, 9:24 AM

## 2023-02-24 ENCOUNTER — Ambulatory Visit: Payer: Medicare Other | Attending: Family Medicine

## 2023-02-24 DIAGNOSIS — M546 Pain in thoracic spine: Secondary | ICD-10-CM | POA: Diagnosis present

## 2023-02-24 DIAGNOSIS — M6281 Muscle weakness (generalized): Secondary | ICD-10-CM | POA: Insufficient documentation

## 2023-02-24 DIAGNOSIS — R278 Other lack of coordination: Secondary | ICD-10-CM | POA: Diagnosis present

## 2023-02-24 DIAGNOSIS — G8929 Other chronic pain: Secondary | ICD-10-CM | POA: Insufficient documentation

## 2023-02-24 DIAGNOSIS — R262 Difficulty in walking, not elsewhere classified: Secondary | ICD-10-CM | POA: Diagnosis present

## 2023-02-24 DIAGNOSIS — R2689 Other abnormalities of gait and mobility: Secondary | ICD-10-CM | POA: Diagnosis present

## 2023-02-24 DIAGNOSIS — R2681 Unsteadiness on feet: Secondary | ICD-10-CM | POA: Insufficient documentation

## 2023-02-24 DIAGNOSIS — G20A1 Parkinson's disease without dyskinesia, without mention of fluctuations: Secondary | ICD-10-CM | POA: Diagnosis present

## 2023-02-24 NOTE — Therapy (Signed)
OUTPATIENT PHYSICAL THERAPY THORACOLUMBAR Treatment    Patient Name: Katelyn Crawford MRN: [id number] DOB:Jul 23, 1940, 83 y.o., female Today's Date: 02/24/2023  END OF SESSION:  PT End of Session - 02/24/23 1045     Visit Number 3    Number of Visits 24    Date for PT Re-Evaluation 05/12/23    Progress Note Due on Visit 10    PT Start Time 1045    PT Stop Time 1130    PT Time Calculation (min) 45 min    Equipment Utilized During Treatment Gait belt    Activity Tolerance Patient tolerated treatment well    Behavior During Therapy WFL for tasks assessed/performed              Past Medical History:  Diagnosis Date   Anemia    Aortic atherosclerosis (HCC)    Arthritis    Cardiomegaly    Cardiomyopathy (HCC)    Cholelithiasis    Chronic kidney disease, stage 2 (mild)    Chronic sinus bradycardia    Coronary artery disease 03/10/2012   a.) LHC 03/10/2012: 40% mLAD, 50% pRI, 50% pRCA, 90% mRCA, 50% dRCA, 50% RPDA - unable to cannulate RCA - med mgmt; b.) LHC/PCI 06/25/2013: 30% pLAD, 40% mLAD, 20% mLCx, 30% RI, 30% pRCA, 70% mRCA (2.5 mm Promus DES), 40% dRCA-1, 90% dRCA-2 (2.5 mm Promus DES)   DDD (degenerative disc disease), thoracolumbar    Diverticulosis    Dizziness    Environmental allergies    Full dentures    GERD (gastroesophageal reflux disease) 06/25/2013   H/O bilateral cataract extraction 2019   History of diverticulosis    History of kidney stones    HLD (hyperlipidemia)    Hyperlipidemia, unspecified    Hypertension    Nephrolithiasis    Osteoporosis, post-menopausal    Parkinson disease (HCC)    Pre-diabetes    PVC's (premature ventricular contractions)    Scoliosis    Thoracic kyphosis    Trifascicular bundle branch block    a.) 1st degree AVB + RBBB + LAFB   Vascular disease    Past Surgical History:  Procedure Laterality Date   APPENDECTOMY     BREAST EXCISIONAL BIOPSY Left 1984   neg   BREAST EXCISIONAL BIOPSY Right 1997   lipoma   CATARACT  EXTRACTION W/PHACO Left 09/02/2017   Procedure: CATARACT EXTRACTION PHACO AND INTRAOCULAR LENS PLACEMENT (IOC)  LEFT;  Surgeon: Lockie Mola, MD;  Location: [facility];  Service: Ophthalmology;  Laterality: Left;   CATARACT EXTRACTION W/PHACO Right 10/01/2017   Procedure: CATARACT EXTRACTION PHACO AND INTRAOCULAR LENS PLACEMENT (IOC) RIGHT;  Surgeon: Lockie Mola, MD;  Location: [facility];  Service: Ophthalmology;  Laterality: Right;   COLONOSCOPY WITH PROPOFOL N/A 11/27/2016   Procedure: COLONOSCOPY WITH PROPOFOL;  Surgeon: Toledo, Boykin Nearing, MD;  Location: [facility];  Service: Gastroenterology;  Laterality: N/A;   CORONARY ANGIOPLASTY WITH STENT PLACEMENT Left 06/25/2013   Procedure: CORONARY ANGIOPLASTY WITH STENT PLACEMENT; Location: [facility]; Surgeon: Rolly Salter, MD   KNEE ARTHROPLASTY Left 01/22/2017   Procedure: COMPUTER ASSISTED TOTAL KNEE ARTHROPLASTY;  Surgeon: Donato Heinz, MD;  Location: [facility];  Service: Orthopedics;  Laterality: Left;   KNEE ARTHROSCOPY Left 02/01/2015   Procedure: ARTHROSCOPY KNEE, partial medial & lateral menisectomy,,medial and patelofemoral chondroplasty;  Surgeon: Donato Heinz, MD;  Location: [facility];  Service: Orthopedics;  Laterality: Left;   LEFT HEART CATH AND CORONARY ANGIOGRAPHY Left 03/10/2012   Procedure: LEFT HEART CATH AND CORONARY  ANGIOGRAPHY; Location: [facility]; Surgeon: Marcina Millard, MD   LITHOTRIPSY Right 1995   RIGHT AXILLARY LIPOMA REMOVAL     TUBAL LIGATION     VARICOSE VEIN SURGERY     Patient Active Problem List   Diagnosis Date Noted   Primary osteoarthritis of hip 02/09/2017   Primary osteoarthritis of knee 02/09/2017   Chronic sinus bradycardia 01/22/2017   Degenerative disc disease, lumbar 01/22/2017   Dizziness 01/22/2017   Nephrolithiasis 01/22/2017   Osteoporosis, 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/22/2017   PVC's (premature ventricular contractions) 01/22/2017   S/P total knee arthroplasty  01/22/2017   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   CKD (chronic kidney disease), 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   GERD (gastroesophageal reflux disease) 06/25/2013   Coronary artery disease involving native coronary artery of native heart 06/24/2013   Essential hypertension 06/21/2013   Mixed hyperlipidemia 06/21/2013    PCP: Marguarite Arbour, MD   REFERRING PROVIDER: Meeler, Jodelle Gross, FNP  REFERRING DIAG:  Diagnosis  [phone] (ICD-10-CM) - Lumbar stenosis with neurogenic claudication  M54.14 (ICD-10-CM) - Thoracic radiculitis    Rationale for Evaluation and Treatment: Rehabilitation  THERAPY DIAG:  Parkinson's disease, unspecified whether dyskinesia present, unspecified whether manifestations fluctuate (HCC)  Muscle weakness (generalized)  Other abnormalities of gait and mobility  Unsteadiness on feet  Chronic bilateral thoracic back pain  ONSET DATE: >3 months   SUBJECTIVE:                                                                                                                                                                                           SUBJECTIVE STATEMENT:  Patient reports her pain comes and goes- some pain across her shoulder blades. States pain in mid back is not quite as bad. States she was able to cook for some of the family.      PERTINENT HISTORY:  Hx of PD and familiar to this clinic for PD management.  From recent MD appointment:  "The patient is a pleasant 83 year old female who presents today for acute on chronic bilateral low back pain without radiation to the lower extremities. She describes pain that began June 2020 without injury. She also states that she has had some form of back pain for many years. She describes her pain as mild to moderate that is sharp and intermittent. Bending and lifting increases her pain. Rest can help alleviate her pain. She participated in physical therapy at [facility] clinic in early 2020 with little relief.  She continues to stay active in her home and notices that household activities increase her pain such as vacuuming and mopping. Medications include Tylenol (mild to moderate relief).  She  was last evaluated on 10/09/2022 at which time she was doing well. She was to continue with heat and home cycling along with physical therapy exercises. She was to continue with tramadol nightly as needed.  At today's visit she reports that she is experience of increasing pain in the morning in her mid back about the bra line. She would like to return to physical therapy and she states that this was very helpful in managing her pain. We discussed if her pain was to continue consideration would be made for another epidural. She states that she is taking tramadol at night and doing well with this. Denies any injury. Upon palpation today she is without tenderness to the thoracic paraspinal musculature. "  PAIN:  Are you having pain? Yes: NPRS scale: 3/10 Pain location: middle back Pain description: ache Aggravating factors: sleeping Relieving factors: tramadol   PRECAUTIONS: Fall  RED FLAGS: None   WEIGHT BEARING RESTRICTIONS: No  FALLS:  Has patient fallen in last 6 months? No  LIVING ENVIRONMENT: Lives with: lives alone and daughter lives next door. Lives in: House/apartment Stairs: Yes: External: 4 steps; on right going up and on left going up Has following equipment at home:  None, but has use cane in the past. Has shower rails, shower chair; doesn't use  OCCUPATION: retired   PLOF: Independent, Independent with basic ADLs, and Independent with gait  PATIENT GOALS: stronger. Improve mobility   NEXT MD VISIT: unsure   OBJECTIVE:  Note: Objective measures were completed at Evaluation unless otherwise noted.  DIAGNOSTIC FINDINGS:  No recent imaging   PATIENT SURVEYS:  Modified Oswestry 28%   COGNITION: Overall cognitive status: Within functional limits for tasks  assessed     SENSATION: WFL  MUSCLE LENGTH: WFL  POSTURE: rounded shoulders, forward head, and decreased lumbar lordosis  PALPATION: Noted tightness in upper thoracic paraspinal. No pain to palpation   LUMBAR ROM:   AROM eval  Flexion WFL  Extension 20  Right lateral flexion   Left lateral flexion   Right rotation minimal  Left rotation ~10 deg   Cervical  AROM eval  Flexion 45  Extension 18  Right lateral flexion 20  Left lateral flexion 12  Right rotation 50  Left rotation  40  Thoracic  AROM eval  Flexion WFL  Extension 10  Right lateral flexion 25  Left lateral flexion 20  Right rotation minimal  Left rotation ~10 deg      (Blank rows = not tested)  LOWER EXTREMITY ROM:     Grossly WFL  LOWER EXTREMITY MMT:    MMT Right eval Left eval  Hip flexion 4- 4-  Hip extension    Hip abduction 4- 4-  Hip adduction 4 4  Hip internal rotation    Hip external rotation    Knee flexion 4 4  Knee extension 4+ 4+  Ankle dorsiflexion 4 4  Ankle plantarflexion    Ankle inversion    Ankle eversion     (Blank rows = not tested)    FUNCTIONAL TESTS:  5 times sit to stand: 16  Timed up and go (TUG): 12.3 sce 6 minute walk test: 1146ft 10 meter walk test: 0.19m/s  GAIT: Distance walked: 70 Assistive device utilized: None Level of assistance: Complete Independence Comments: flexed posture.   TREATMENT DATE: 02/17/2023  NMR: Thoracic ROM- Seated- begin with exaggerated flexion then into correct sitting erect posture x 10 (VC to flex with thoracic spine and then go into extension)  Seated thoracic ext using back of chair - VC to look up for correct posture x 12 reps Trunk rotation to tap contralateral hand with scapular retraction following each bout x 10 (VC to turn head and shoulders to max thoracic rotation for combined movement)  Sit<>stand with UE swing into extension for 90 deg flexion x 10  Active scap retraction with feedback from mirror to  ensure correct posture without shrug 2 sets x 10 reps Posterior shoulder rolls with feeback from mirror to ensure correct posture- 2 x 10 reps Standing at steps with UE on rails and 1 foot on floor  and opp LE on 2nd step- perform thoracic rotation- diagonal reaching across body to tap opp railing x 10 reps x 2 sets - focusing on coordination functional movement pattern reaching cross body and maintaining balance.   Therapeutic Activities: dynamic therapeutic activities designed to achieve improved functional performance  Resistive Scap retraction for improved ROM and strengthening RTB 2 x 10 reps  (VC for slow and controlled technique) to help with pulling activities Active Horizontal shoulder abd - 2 x 10 reps to improve reaching out functional ability Contralateral reach overhead with 1 UE  x 10 alt UEs (For improved Functional reaching and strength)   PATIENT EDUCATION:  Education details: POC; HEP initiated.  Person educated: Patient Education method: Explanation Education comprehension: verbalized understanding  HOME EXERCISE PROGRAM: Access Code: [email or web address] URL: [email or web address] Date: 02/20/2023 Prepared by: Grier Rocher  Exercises - Sit to Stand with Arm Swing  - 1 x daily - 7 x weekly - 2 sets - 10 reps - 2 hold - Seated Reach Forward, Up, and To Sides  - 1 x daily - 7 x weekly - 3 sets - 10 reps - Seated Reaching to Side and Across Body  - 1 x daily - 7 x weekly - 3 sets - 10 reps - Standing Row with Anchored Resistance  - 1 x daily - 7 x weekly - 3 sets - 10 reps  ASSESSMENT:  CLINICAL IMPRESSION: Treatment focused on exercises designed to improve postural awareness and strength today. She performed well with all thoracic mobility and able to follow all VC for correct techniques and no report of any back pain throughout session. She was able to incorporate some improved posture techniques with use of mirror for feedback and VC/visual demonstration for  correct techniques. Pt will benefit from PT to address back pain improve strength, and overall fucntional mobility   OBJECTIVE IMPAIRMENTS: Abnormal gait, cardiopulmonary status limiting activity, decreased activity tolerance, decreased balance, decreased endurance, decreased mobility, difficulty walking, decreased ROM, decreased strength, hypomobility, increased fascial restrictions, impaired perceived functional ability, impaired flexibility, impaired UE functional use, improper body mechanics, and postural dysfunction.   ACTIVITY LIMITATIONS: carrying, squatting, sleeping, and locomotion level  PARTICIPATION LIMITATIONS: cleaning, laundry, shopping, and community activity  PERSONAL FACTORS: Age, Fitness, and 1-2 comorbidities: lumbar stenosis   are also affecting patient's functional outcome.   REHAB POTENTIAL: Good  CLINICAL DECISION MAKING: Stable/uncomplicated  EVALUATION COMPLEXITY: Low   GOALS: Goals reviewed with patient? Yes   SHORT TERM GOALS: Target date: 03/18/2023    Patient will be independent in home exercise program to improve strength/mobility for better functional independence with ADLs. Baseline: to be given at session 2.  Goal status: INITIAL   LONG TERM GOALS: Target date: 05/12/2023    Patient will increase Mod ODI score by 12.8 points  to demonstrate statistically significant improvement in mobility and quality of life.  Baseline: 28%  Goal status: INITIAL  2.  Patient (> 54 years old) will complete five times sit to stand test in < 15 seconds indicating an increased LE strength and improved balance. Baseline: 16 sec  Goal status: INITIAL  3.  Patient will increase 6 min walk test score by >150 to demonstrated improved access to community and improve safety.   Baseline: 1149ft Goal status: INITIAL  4.  Patient will increase 10 meter walk test to >1.13m/s as to improve gait speed for better community ambulation and to reduce fall risk. Baseline:  0.65m/s  Goal status: INITIAL  5.  Patient will reduce timed up and go to <11 seconds to reduce fall risk and demonstrate improved transfer/gait ability. Baseline: 13sec  Goal status: INITIAL  6.  Patient will reduced back pain to 3/10 at worst to indicate improved function with hold tasks.  Baseline: 9/10 at night.  Goal status: INITIAL  PLAN:  PT FREQUENCY: 1-2x/week  PT DURATION: 12 weeks  PLANNED INTERVENTIONS: 97110-Therapeutic exercises, 97530- Therapeutic activity, [id number]- Neuromuscular re-education, 97535- Self Care, 21308- Manual therapy, [phone]- Gait training, 97014- Electrical stimulation (unattended), [phone]- Electrical stimulation (manual), Balance training, Stair training, Taping, Dry Needling, Joint mobilization, Joint manipulation, Spinal manipulation, Spinal mobilization, Scar mobilization, DME instructions, Cryotherapy, and Moist heat.  PLAN FOR NEXT SESSION:   Continue to address trunk mobility deficits PD treatment.    Lenda Kelp, PT 02/24/2023, 12:28 PM

## 2023-02-26 ENCOUNTER — Ambulatory Visit: Payer: Medicare Other | Admitting: Physical Therapy

## 2023-02-26 DIAGNOSIS — R2681 Unsteadiness on feet: Secondary | ICD-10-CM

## 2023-02-26 DIAGNOSIS — G20A1 Parkinson's disease without dyskinesia, without mention of fluctuations: Secondary | ICD-10-CM | POA: Diagnosis not present

## 2023-02-26 DIAGNOSIS — G8929 Other chronic pain: Secondary | ICD-10-CM

## 2023-02-26 DIAGNOSIS — R2689 Other abnormalities of gait and mobility: Secondary | ICD-10-CM

## 2023-02-26 DIAGNOSIS — M6281 Muscle weakness (generalized): Secondary | ICD-10-CM

## 2023-02-26 NOTE — Therapy (Signed)
 OUTPATIENT PHYSICAL THERAPY THORACOLUMBAR Treatment    Patient Name: JADEYN HARGETT MRN: [id number] DOB:[birth year], 83 y.o., female Today's Date: 02/26/2023  END OF SESSION:  PT End of Session - 02/26/23 0935     Visit Number 4    Number of Visits 24    Date for PT Re-Evaluation 05/12/23    Progress Note Due on Visit 10    PT Start Time 0933    PT Stop Time 1015    PT Time Calculation (min) 42 min    Equipment Utilized During Treatment Gait belt    Activity Tolerance Patient tolerated treatment well    Behavior During Therapy WFL for tasks assessed/performed              Past Medical History:  Diagnosis Date   Anemia    Aortic atherosclerosis (HCC)    Arthritis    Cardiomegaly    Cardiomyopathy (HCC)    Cholelithiasis    Chronic kidney disease, stage 2 (mild)    Chronic sinus bradycardia    Coronary artery disease 03/10/2012   a.) LHC 03/10/2012: 40% mLAD, 50% pRI, 50% pRCA, 90% mRCA, 50% dRCA, 50% RPDA - unable to cannulate RCA - med mgmt; b.) LHC/PCI 06/25/2013: 30% pLAD, 40% mLAD, 20% mLCx, 30% RI, 30% pRCA, 70% mRCA (2.5 mm Promus DES), 40% dRCA-1, 90% dRCA-2 (2.5 mm Promus DES)   DDD (degenerative disc disease), thoracolumbar    Diverticulosis    Dizziness    Environmental allergies    Full dentures    GERD (gastroesophageal reflux disease) 06/25/2013   H/O bilateral cataract extraction 2019   History of diverticulosis    History of kidney stones    HLD (hyperlipidemia)    Hyperlipidemia, unspecified    Hypertension    Nephrolithiasis    Osteoporosis, post-menopausal    Parkinson disease (HCC)    Pre-diabetes    PVC's (premature ventricular contractions)    Scoliosis    Thoracic kyphosis    Trifascicular bundle branch block    a.) 1st degree AVB + RBBB + LAFB   Vascular disease    Past Surgical History:  Procedure Laterality Date   APPENDECTOMY     BREAST EXCISIONAL BIOPSY Left 1984   neg   BREAST EXCISIONAL BIOPSY Right 1997   lipoma   CATARACT  EXTRACTION W/PHACO Left 09/02/2017   Procedure: CATARACT EXTRACTION PHACO AND INTRAOCULAR LENS PLACEMENT (IOC)  LEFT;  Surgeon: Mittie Gaskin, MD;  Location: [facility];  Service: Ophthalmology;  Laterality: Left;   CATARACT EXTRACTION W/PHACO Right 10/01/2017   Procedure: CATARACT EXTRACTION PHACO AND INTRAOCULAR LENS PLACEMENT (IOC) RIGHT;  Surgeon: Mittie Gaskin, MD;  Location: [facility];  Service: Ophthalmology;  Laterality: Right;   COLONOSCOPY WITH PROPOFOL  N/A 11/27/2016   Procedure: COLONOSCOPY WITH PROPOFOL ;  Surgeon: Toledo, Ladell MARLA, MD;  Location: [facility];  Service: Gastroenterology;  Laterality: N/A;   CORONARY ANGIOPLASTY WITH STENT PLACEMENT Left 06/25/2013   Procedure: CORONARY ANGIOPLASTY WITH STENT PLACEMENT; Location: [facility]; Surgeon: Dayton Dash, MD   KNEE ARTHROPLASTY Left 01/22/2017   Procedure: COMPUTER ASSISTED TOTAL KNEE ARTHROPLASTY;  Surgeon: Mardee Lynwood SQUIBB, MD;  Location: [facility];  Service: Orthopedics;  Laterality: Left;   KNEE ARTHROSCOPY Left 02/01/2015   Procedure: ARTHROSCOPY KNEE, partial medial & lateral menisectomy,,medial and patelofemoral chondroplasty;  Surgeon: Lynwood SQUIBB Mardee, MD;  Location: [facility];  Service: Orthopedics;  Laterality: Left;   LEFT HEART CATH AND CORONARY ANGIOGRAPHY Left 03/10/2012   Procedure: LEFT HEART CATH AND CORONARY  ANGIOGRAPHY; Location: [facility]; Surgeon: Marsa Dooms, MD   LITHOTRIPSY Right 1995   RIGHT AXILLARY LIPOMA REMOVAL     TUBAL LIGATION     VARICOSE VEIN SURGERY     Patient Active Problem List   Diagnosis Date Noted   Primary osteoarthritis of hip 02/09/2017   Primary osteoarthritis of knee 02/09/2017   Chronic sinus bradycardia 01/22/2017   Degenerative disc disease, lumbar 01/22/2017   Dizziness 01/22/2017   Nephrolithiasis 01/22/2017   Osteoporosis, 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/22/2017   PVC's (premature ventricular contractions) 01/22/2017   S/P total knee arthroplasty  01/22/2017   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   CKD (chronic kidney disease), 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   GERD (gastroesophageal reflux disease) 06/25/2013   Coronary artery disease involving native coronary artery of native heart 06/24/2013   Essential hypertension 06/21/2013   Mixed hyperlipidemia 06/21/2013    PCP: Auston Reyes BIRCH, MD   REFERRING PROVIDER: Meeler, Benton CROME, FNP  REFERRING DIAG:  Diagnosis  [phone] (ICD-10-CM) - Lumbar stenosis with neurogenic claudication  M54.14 (ICD-10-CM) - Thoracic radiculitis    Rationale for Evaluation and Treatment: Rehabilitation  THERAPY DIAG:  Parkinson's disease, unspecified whether dyskinesia present, unspecified whether manifestations fluctuate (HCC)  Muscle weakness (generalized)  Other abnormalities of gait and mobility  Unsteadiness on feet  Chronic bilateral thoracic back pain  ONSET DATE: >3 months   SUBJECTIVE:                                                                                                                                                                                           SUBJECTIVE STATEMENT:  Patient reports that she is doing better today than yesterday. States  that she had a very busy day on Monday causing increased fatigue on Tuesday; went to grocery, PT, visited relative in hospital, and baked a cake on Monday.  Pt's family member that was hospitalized; died on 03/23/2023.  No pain in back at start of PT session, and states better sleep last night.    PERTINENT HISTORY:  Hx of PD and familiar to this clinic for PD management.  From recent MD appointment:  The patient is a pleasant 83 year old female who presents today for acute on chronic bilateral low back pain without radiation to the lower extremities. She describes pain that began June 2020 without injury. She also states that she has had some form of back pain for many years. She describes her pain as mild to moderate that is  sharp and intermittent. Bending and lifting increases her pain. Rest can help alleviate her pain. She participated in physical therapy at [facility] in early 2020 with little relief.  She continues to stay active in her home and notices that household activities increase her pain such as vacuuming and mopping. Medications include Tylenol  (mild to moderate relief).  She was last evaluated on 10/09/2022 at which time she was doing well. She was to continue with heat and home cycling along with physical therapy exercises. She was to continue with tramadol  nightly as needed.  At today's visit she reports that she is experience of increasing pain in the morning in her mid back about the bra line. She would like to return to physical therapy and she states that this was very helpful in managing her pain. We discussed if her pain was to continue consideration would be made for another epidural. She states that she is taking tramadol  at night and doing well with this. Denies any injury. Upon palpation today she is without tenderness to the thoracic paraspinal musculature.   PAIN:  Are you having pain? Yes: NPRS scale: 3/10 Pain location: middle back Pain description: ache Aggravating factors: sleeping Relieving factors: tramadol    PRECAUTIONS: Fall  RED FLAGS: None   WEIGHT BEARING RESTRICTIONS: No  FALLS:  Has patient fallen in last 6 months? No  LIVING ENVIRONMENT: Lives with: lives alone and daughter lives next door. Lives in: House/apartment Stairs: Yes: External: 4 steps; on right going up and on left going up Has following equipment at home:  None, but has use cane in the past. Has shower rails, shower chair; doesn't use  OCCUPATION: retired   PLOF: Independent, Independent with basic ADLs, and Independent with gait  PATIENT GOALS: stronger. Improve mobility   NEXT MD VISIT: unsure   OBJECTIVE:  Note: Objective measures were completed at Evaluation unless otherwise  noted.  DIAGNOSTIC FINDINGS:  No recent imaging   PATIENT SURVEYS:  Modified Oswestry 28%   COGNITION: Overall cognitive status: Within functional limits for tasks assessed     SENSATION: WFL  MUSCLE LENGTH: WFL  POSTURE: rounded shoulders, forward head, and decreased lumbar lordosis  PALPATION: Noted tightness in upper thoracic paraspinal. No pain to palpation   LUMBAR ROM:   AROM eval  Flexion WFL  Extension 20  Right lateral flexion   Left lateral flexion   Right rotation minimal  Left rotation ~10 deg   Cervical  AROM eval  Flexion 45  Extension 18  Right lateral flexion 20  Left lateral flexion 12  Right rotation 50  Left rotation  40  Thoracic  AROM eval  Flexion WFL  Extension 10  Right lateral flexion 25  Left lateral flexion 20  Right rotation minimal  Left rotation ~10 deg      (Blank rows = not tested)  LOWER EXTREMITY ROM:     Grossly WFL  LOWER EXTREMITY MMT:    MMT Right eval Left eval  Hip flexion 4- 4-  Hip extension    Hip abduction 4- 4-  Hip adduction 4 4  Hip internal rotation    Hip external rotation    Knee flexion 4 4  Knee extension 4+ 4+  Ankle dorsiflexion 4 4  Ankle plantarflexion    Ankle inversion    Ankle eversion     (Blank rows = not tested)    FUNCTIONAL TESTS:  5 times sit to stand: 16  Timed up and go (TUG): 12.3 sce 6 minute walk test: 1162ft 10 meter walk test: 0.63m/s  GAIT: Distance walked: 70 Assistive device utilized: None Level of assistance: Complete Independence Comments: flexed posture.   TREATMENT DATE:  02/17/2023  NMR: Nustep BUE/BLE reciprocal movement training x 6 min, level 1-3 with cues for full ROM and increased trunk rotation as tolerated.  BUE Forward reach with neutral tspine into shoulder and trunk extension . X 10  Single UE lateral/crossbody reach with weight shift each direction x 10 bil  Sit to stand with UE swing into extension.  X 10 bil  Weighted gait with 3#  AW 2x 357ft cues for posture and improved step length and reciprocal arm swing  TE Standing shoulder extension with retraction YTB x 15 Standing mid row x 15 YTB.  Stand doorway pec stretch x 5 with 5 sec hold  Standing with foam roll behind back and UE abduction x 10   Cues for improved posture and cervical alignment intermittently throughout session to reduce tension on R UT and improve symmetry of movement.     PATIENT EDUCATION:  Education details: POC; benefits of improved postural strength and ROM.  Person educated: Patient Education method: Explanation Education comprehension: verbalized understanding  HOME EXERCISE PROGRAM: Access Code: [email or web address] URL: [email or web address] Date: 02/20/2023 Prepared by: Massie Dollar  Exercises - Sit to Stand with Arm Swing  - 1 x daily - 7 x weekly - 2 sets - 10 reps - 2 hold - Seated Reach Forward, Up, and To Sides  - 1 x daily - 7 x weekly - 3 sets - 10 reps - Seated Reaching to Side and Across Body  - 1 x daily - 7 x weekly - 3 sets - 10 reps - Standing Row with Anchored Resistance  - 1 x daily - 7 x weekly - 3 sets - 10 reps  ASSESSMENT:  CLINICAL IMPRESSION: Treatment focused on exercises designed to improve postural awareness and strength today. Noted to have improved ROM and thoracic extension with increased repetitions. Min cues for exercise technique with dynamic movements to improve consistency of proper sequencing of movements. No increased lower or mid back pain reported on this day. Pt will benefit from PT to address back pain improve strength, and overall fucntional mobility   OBJECTIVE IMPAIRMENTS: Abnormal gait, cardiopulmonary status limiting activity, decreased activity tolerance, decreased balance, decreased endurance, decreased mobility, difficulty walking, decreased ROM, decreased strength, hypomobility, increased fascial restrictions, impaired perceived functional ability, impaired flexibility, impaired UE  functional use, improper body mechanics, and postural dysfunction.   ACTIVITY LIMITATIONS: carrying, squatting, sleeping, and locomotion level  PARTICIPATION LIMITATIONS: cleaning, laundry, shopping, and community activity  PERSONAL FACTORS: Age, Fitness, and 1-2 comorbidities: lumbar stenosis   are also affecting patient's functional outcome.   REHAB POTENTIAL: Good  CLINICAL DECISION MAKING: Stable/uncomplicated  EVALUATION COMPLEXITY: Low   GOALS: Goals reviewed with patient? Yes   SHORT TERM GOALS: Target date: 03/18/2023    Patient will be independent in home exercise program to improve strength/mobility for better functional independence with ADLs. Baseline: to be given at session 2.  Goal status: INITIAL   LONG TERM GOALS: Target date: 05/12/2023    Patient will increase Mod ODI score by 12.8 points  to demonstrate statistically significant improvement in mobility and quality of life.  Baseline: 28% Goal status: INITIAL  2.  Patient (> 70 years old) will complete five times sit to stand test in < 15 seconds indicating an increased LE strength and improved balance. Baseline: 16 sec  Goal status: INITIAL  3.  Patient will increase 6 min walk test score by >150 to demonstrated improved access to community and improve safety.   Baseline: 1169ft Goal status: INITIAL  4.  Patient will increase 10 meter walk test to >1.78m/s as to improve gait speed for better community ambulation and to reduce fall risk. Baseline: 0.7m/s  Goal status: INITIAL  5.  Patient will reduce timed up and go to <11 seconds to reduce fall risk and demonstrate improved transfer/gait ability. Baseline: 13sec  Goal status: INITIAL  6.  Patient will reduced back pain to 3/10 at worst to indicate improved function with hold tasks.  Baseline: 9/10 at night.  Goal status: INITIAL  PLAN:  PT FREQUENCY: 1-2x/week  PT DURATION: 12 weeks  PLANNED INTERVENTIONS: 97110-Therapeutic exercises,  97530- Therapeutic activity, [id number]- Neuromuscular re-education, 97535- Self Care, [id number]- Manual therapy, [phone]- Gait training, [id number]- Electrical stimulation (unattended), [phone]- Electrical stimulation (manual), Balance training, Stair training, Taping, Dry Needling, Joint mobilization, Joint manipulation, Spinal manipulation, Spinal mobilization, Scar mobilization, DME instructions, Cryotherapy, and Moist heat.  PLAN FOR NEXT SESSION:   Continue to address trunk mobility deficits PD treatment.    Massie FORBES Dollar, PT 02/26/2023, 9:36 AM

## 2023-03-03 ENCOUNTER — Ambulatory Visit: Payer: Medicare Other

## 2023-03-03 DIAGNOSIS — M6281 Muscle weakness (generalized): Secondary | ICD-10-CM

## 2023-03-03 DIAGNOSIS — R262 Difficulty in walking, not elsewhere classified: Secondary | ICD-10-CM

## 2023-03-03 DIAGNOSIS — G20A1 Parkinson's disease without dyskinesia, without mention of fluctuations: Secondary | ICD-10-CM | POA: Diagnosis not present

## 2023-03-03 DIAGNOSIS — R278 Other lack of coordination: Secondary | ICD-10-CM

## 2023-03-03 NOTE — Therapy (Signed)
 OUTPATIENT PHYSICAL THERAPY THORACOLUMBAR Treatment    Patient Name: Katelyn Crawford MRN: [id number] DOB:10/15/1940, 83 y.o., female Today's Date: 03/03/2023  END OF SESSION:     Past Medical History:  Diagnosis Date   Anemia    Aortic atherosclerosis (HCC)    Arthritis    Cardiomegaly    Cardiomyopathy (HCC)    Cholelithiasis    Chronic kidney disease, stage 2 (mild)    Chronic sinus bradycardia    Coronary artery disease 03/10/2012   a.) LHC 03/10/2012: 40% mLAD, 50% pRI, 50% pRCA, 90% mRCA, 50% dRCA, 50% RPDA - unable to cannulate RCA - med mgmt; b.) LHC/PCI 06/25/2013: 30% pLAD, 40% mLAD, 20% mLCx, 30% RI, 30% pRCA, 70% mRCA (2.5 mm Promus DES), 40% dRCA-1, 90% dRCA-2 (2.5 mm Promus DES)   DDD (degenerative disc disease), thoracolumbar    Diverticulosis    Dizziness    Environmental allergies    Full dentures    GERD (gastroesophageal reflux disease) 06/25/2013   H/O bilateral cataract extraction 2019   History of diverticulosis    History of kidney stones    HLD (hyperlipidemia)    Hyperlipidemia, unspecified    Hypertension    Nephrolithiasis    Osteoporosis, post-menopausal    Parkinson disease (HCC)    Pre-diabetes    PVC's (premature ventricular contractions)    Scoliosis    Thoracic kyphosis    Trifascicular bundle branch block    a.) 1st degree AVB + RBBB + LAFB   Vascular disease    Past Surgical History:  Procedure Laterality Date   APPENDECTOMY     BREAST EXCISIONAL BIOPSY Left 1984   neg   BREAST EXCISIONAL BIOPSY Right 1997   lipoma   CATARACT EXTRACTION W/PHACO Left 09/02/2017   Procedure: CATARACT EXTRACTION PHACO AND INTRAOCULAR LENS PLACEMENT (IOC)  LEFT;  Surgeon: Annell Kidney, MD;  Location: [facility];  Service: Ophthalmology;  Laterality: Left;   CATARACT EXTRACTION W/PHACO Right 10/01/2017   Procedure: CATARACT EXTRACTION PHACO AND INTRAOCULAR LENS PLACEMENT (IOC) RIGHT;  Surgeon: Annell Kidney, MD;  Location:  [facility];  Service: Ophthalmology;  Laterality: Right;   COLONOSCOPY WITH PROPOFOL  N/A 11/27/2016   Procedure: COLONOSCOPY WITH PROPOFOL ;  Surgeon: Toledo, Alphonsus Jeans, MD;  Location: [facility];  Service: Gastroenterology;  Laterality: N/A;   CORONARY ANGIOPLASTY WITH STENT PLACEMENT Left 06/25/2013   Procedure: CORONARY ANGIOPLASTY WITH STENT PLACEMENT; Location: [facility]; Surgeon: Arletta Lager, MD   KNEE ARTHROPLASTY Left 01/22/2017   Procedure: COMPUTER ASSISTED TOTAL KNEE ARTHROPLASTY;  Surgeon: Arlyne Lame, MD;  Location: [facility];  Service: Orthopedics;  Laterality: Left;   KNEE ARTHROSCOPY Left 02/01/2015   Procedure: ARTHROSCOPY KNEE, partial medial & lateral menisectomy,,medial and patelofemoral chondroplasty;  Surgeon: Arlyne Lame, MD;  Location: [facility];  Service: Orthopedics;  Laterality: Left;   LEFT HEART CATH AND CORONARY ANGIOGRAPHY Left 03/10/2012   Procedure: LEFT HEART CATH AND CORONARY ANGIOGRAPHY; Location: [facility]; Surgeon: Percival Brace, MD   LITHOTRIPSY Right 1995   RIGHT AXILLARY LIPOMA REMOVAL     TUBAL LIGATION     VARICOSE VEIN SURGERY     Patient Active Problem List   Diagnosis Date Noted   Primary osteoarthritis of hip 02/09/2017   Primary osteoarthritis of knee 02/09/2017   Chronic sinus bradycardia 01/22/2017   Degenerative disc disease, lumbar 01/22/2017   Dizziness 01/22/2017   Nephrolithiasis 01/22/2017   Osteoporosis, 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/22/2017   PVC's (premature ventricular contractions) 01/22/2017   S/P total knee arthroplasty 01/22/2017  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   CKD (chronic kidney disease), 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   GERD (gastroesophageal reflux disease) 06/25/2013   Coronary artery disease involving native coronary artery of native heart 06/24/2013   Essential hypertension 06/21/2013   Mixed hyperlipidemia 06/21/2013    PCP: Yehuda Helms, MD   REFERRING PROVIDER: Meeler, Driscilla George,  FNP  REFERRING DIAG:  Diagnosis  [phone] (ICD-10-CM) - Lumbar stenosis with neurogenic claudication  M54.14 (ICD-10-CM) - Thoracic radiculitis    Rationale for Evaluation and Treatment: Rehabilitation  THERAPY DIAG:  No diagnosis found.  ONSET DATE: >3 months   SUBJECTIVE:                                                                                                                                                                                           SUBJECTIVE STATEMENT:  Pt has been sleeping pretty good.  Pt reports mid back pain bothers her initially in the morning and then improves. She reports no stumbles/falls.   PERTINENT HISTORY:  Hx of PD and familiar to this clinic for PD management.  From recent MD appointment:  "The patient is a pleasant 83 year old female who presents today for acute on chronic bilateral low back pain without radiation to the lower extremities. She describes pain that began June 2020 without injury. She also states that she has had some form of back pain for many years. She describes her pain as mild to moderate that is sharp and intermittent. Bending and lifting increases her pain. Rest can help alleviate her pain. She participated in physical therapy at [facility] clinic in early 2020 with little relief. She continues to stay active in her home and notices that household activities increase her pain such as vacuuming and mopping. Medications include Tylenol  (mild to moderate relief).  She was last evaluated on 10/09/2022 at which time she was doing well. She was to continue with heat and home cycling along with physical therapy exercises. She was to continue with tramadol  nightly as needed.  At today's visit she reports that she is experience of increasing pain in the morning in her mid back about the bra line. She would like to return to physical therapy and she states that this was very helpful in managing her pain. We discussed if her pain was to  continue consideration would be made for another epidural. She states that she is taking tramadol  at night and doing well with this. Denies any injury. Upon palpation today she is without tenderness to the thoracic paraspinal musculature. "  PAIN:  Are you having pain? Yes: NPRS scale: 3/10 Pain location: middle back Pain description:  ache Aggravating factors: sleeping Relieving factors: tramadol    PRECAUTIONS: Fall   RED FLAGS: None   WEIGHT BEARING RESTRICTIONS: No  FALLS:  Has patient fallen in last 6 months? No  LIVING ENVIRONMENT: Lives with: lives alone and daughter lives next door. Lives in: House/apartment Stairs: Yes: External: 4 steps; on right going up and on left going up Has following equipment at home:  None, but has use cane in the past. Has shower rails, shower chair; doesn't use  OCCUPATION: retired   PLOF: Independent, Independent with basic ADLs, and Independent with gait  PATIENT GOALS: stronger. Improve mobility   NEXT MD VISIT: unsure   OBJECTIVE:  Note: Objective measures were completed at Evaluation unless otherwise noted.  DIAGNOSTIC FINDINGS:  No recent imaging   PATIENT SURVEYS:  Modified Oswestry 28%   COGNITION: Overall cognitive status: Within functional limits for tasks assessed     SENSATION: WFL  MUSCLE LENGTH: WFL  POSTURE: rounded shoulders, forward head, and decreased lumbar lordosis  PALPATION: Noted tightness in upper thoracic paraspinal. No pain to palpation   LUMBAR ROM:   AROM eval  Flexion WFL  Extension 20  Right lateral flexion   Left lateral flexion   Right rotation minimal  Left rotation ~10 deg   Cervical  AROM eval  Flexion 45  Extension 18  Right lateral flexion 20  Left lateral flexion 12  Right rotation 50  Left rotation  40  Thoracic  AROM eval  Flexion WFL  Extension 10  Right lateral flexion 25  Left lateral flexion 20  Right rotation minimal  Left rotation ~10 deg      (Blank  rows = not tested)  LOWER EXTREMITY ROM:     Grossly WFL  LOWER EXTREMITY MMT:    MMT Right eval Left eval  Hip flexion 4- 4-  Hip extension    Hip abduction 4- 4-  Hip adduction 4 4  Hip internal rotation    Hip external rotation    Knee flexion 4 4  Knee extension 4+ 4+  Ankle dorsiflexion 4 4  Ankle plantarflexion    Ankle inversion    Ankle eversion     (Blank rows = not tested)    FUNCTIONAL TESTS:  5 times sit to stand: 16  Timed up and go (TUG): 12.3 sce 6 minute walk test: 1146ft 10 meter walk test: 0.61m/s  GAIT: Distance walked: 70 Assistive device utilized: None Level of assistance: Complete Independence Comments: flexed posture.   TREATMENT DATE: 02/17/2023  NMR: Nustep BUE/BLE reciprocal movement training x 6 min, level 1-3 with cues for full ROM and increased trunk rotation as tolerated.  Pt rates medium  BUE Forward reach with neutral tspine into shoulder and trunk extension  X 10   Single UE lateral/crossbody reach with weight shift each direction x 10 bil   Sit to stand with UE swing into extension.  X 10 bil - some compensation with abduction, rates medium  Weighted gait with 3# AW 2x 3108ft cues for posture and improved step length and reciprocal arm swing - consistent in sustaining increased step-length with minimal cuing  TA: Standing shoulder extension with retraction YTB x 15, x6 - additional reps to improve technique with cuing - rates easy Standing mid row x 15 YTB.  X6 - additional reps to improve technique with cuing - rates easy  Stand doorway pec stretch x 5 with 5 sec hold  Seated with foam roll behind back and UE abduction 2x 10 ,  block used under feet for improved support/posture in chair   Finger flicks 20x bilat UE Technique with donning jacket/sleeve 1x  Cues for improved posture and cervical alignment intermittently throughout session     PATIENT EDUCATION:  Education details:exercise technique, large-amplitude  training/purpose, techniques for donning jacket, finger flicks Person educated: Patient Education method: IT trainer, VC Education comprehension: verbalized understanding, returned demo  HOME EXERCISE PROGRAM: Access Code: [email or web address] URL: [email or web address] Date: 02/20/2023 Prepared by: Aurora Lees  Exercises - Sit to Stand with Arm Swing  - 1 x daily - 7 x weekly - 2 sets - 10 reps - 2 hold - Seated Reach Forward, Up, and To Sides  - 1 x daily - 7 x weekly - 3 sets - 10 reps - Seated Reaching to Side and Across Body  - 1 x daily - 7 x weekly - 3 sets - 10 reps - Standing Row with Anchored Resistance  - 1 x daily - 7 x weekly - 3 sets - 10 reps  ASSESSMENT:  CLINICAL IMPRESSION: Author continued plan of care as laid out in recent sessions. Pt tolerated these well without significant fatigue or reports of pain. Provided education and demo on use of finger flicks and technique for ease with donning jacket.  Pt will benefit from PT to address back pain improve strength, and overall fucntional mobility   OBJECTIVE IMPAIRMENTS: Abnormal gait, cardiopulmonary status limiting activity, decreased activity tolerance, decreased balance, decreased endurance, decreased mobility, difficulty walking, decreased ROM, decreased strength, hypomobility, increased fascial restrictions, impaired perceived functional ability, impaired flexibility, impaired UE functional use, improper body mechanics, and postural dysfunction.   ACTIVITY LIMITATIONS: carrying, squatting, sleeping, and locomotion level  PARTICIPATION LIMITATIONS: cleaning, laundry, shopping, and community activity  PERSONAL FACTORS: Age, Fitness, and 1-2 comorbidities: lumbar stenosis   are also affecting patient's functional outcome.   REHAB POTENTIAL: Good  CLINICAL DECISION MAKING: Stable/uncomplicated  EVALUATION COMPLEXITY: Low   GOALS: Goals reviewed with patient? Yes   SHORT TERM GOALS: Target date:  03/18/2023    Patient will be independent in home exercise program to improve strength/mobility for better functional independence with ADLs. Baseline: to be given at session 2.  Goal status: INITIAL   LONG TERM GOALS: Target date: 05/12/2023    Patient will increase Mod ODI score by 12.8 points  to demonstrate statistically significant improvement in mobility and quality of life.  Baseline: 28% Goal status: INITIAL  2.  Patient (> 39 years old) will complete five times sit to stand test in < 15 seconds indicating an increased LE strength and improved balance. Baseline: 16 sec  Goal status: INITIAL  3.  Patient will increase 6 min walk test score by >150 to demonstrated improved access to community and improve safety.   Baseline: 1169ft Goal status: INITIAL  4.  Patient will increase 10 meter walk test to >1.77m/s as to improve gait speed for better community ambulation and to reduce fall risk. Baseline: 0.40m/s  Goal status: INITIAL  5.  Patient will reduce timed up and go to <11 seconds to reduce fall risk and demonstrate improved transfer/gait ability. Baseline: 13sec  Goal status: INITIAL  6.  Patient will reduced back pain to 3/10 at worst to indicate improved function with hold tasks.  Baseline: 9/10 at night.  Goal status: INITIAL  PLAN:  PT FREQUENCY: 1-2x/week  PT DURATION: 12 weeks  PLANNED INTERVENTIONS: 97110-Therapeutic exercises, 97530- Therapeutic activity, [id number]- Neuromuscular re-education, 97535- Self Care, [id number]- Manual therapy, [id number]- Gait training, [phone]- Electrical  stimulation (unattended), [phone]- Electrical stimulation (manual), Balance training, Stair training, Taping, Dry Needling, Joint mobilization, Joint manipulation, Spinal manipulation, Spinal mobilization, Scar mobilization, DME instructions, Cryotherapy, and Moist heat.  PLAN FOR NEXT SESSION:   Continue to address trunk mobility deficits PD treatment.  Continue plan   Samie Crews,  PT 03/03/2023, 11:24 AM

## 2023-03-05 ENCOUNTER — Ambulatory Visit: Payer: Medicare Other

## 2023-03-06 ENCOUNTER — Ambulatory Visit: Payer: Medicare Other

## 2023-03-10 ENCOUNTER — Ambulatory Visit: Payer: Medicare Other

## 2023-03-10 DIAGNOSIS — M6281 Muscle weakness (generalized): Secondary | ICD-10-CM

## 2023-03-10 DIAGNOSIS — G8929 Other chronic pain: Secondary | ICD-10-CM

## 2023-03-10 DIAGNOSIS — R262 Difficulty in walking, not elsewhere classified: Secondary | ICD-10-CM

## 2023-03-10 DIAGNOSIS — R278 Other lack of coordination: Secondary | ICD-10-CM

## 2023-03-10 DIAGNOSIS — G20A1 Parkinson's disease without dyskinesia, without mention of fluctuations: Secondary | ICD-10-CM | POA: Diagnosis not present

## 2023-03-10 NOTE — Therapy (Signed)
OUTPATIENT PHYSICAL THERAPY THORACOLUMBAR Treatment    Patient Name: Katelyn Crawford MRN: [id number] DOB:05-25-1940, 83 y.o., female Today's Date: 03/10/2023  END OF SESSION:  PT End of Session - 03/10/23 1108     Visit Number 6    Number of Visits 24    Date for PT Re-Evaluation 05/12/23    Progress Note Due on Visit 10    PT Start Time 1105    PT Stop Time 1144    PT Time Calculation (min) 39 min    Equipment Utilized During Treatment Gait belt    Activity Tolerance Patient tolerated treatment well    Behavior During Therapy WFL for tasks assessed/performed               Past Medical History:  Diagnosis Date   Anemia    Aortic atherosclerosis (HCC)    Arthritis    Cardiomegaly    Cardiomyopathy (HCC)    Cholelithiasis    Chronic kidney disease, stage 2 (mild)    Chronic sinus bradycardia    Coronary artery disease 03/10/2012   a.) LHC 03/10/2012: 40% mLAD, 50% pRI, 50% pRCA, 90% mRCA, 50% dRCA, 50% RPDA - unable to cannulate RCA - med mgmt; b.) LHC/PCI 06/25/2013: 30% pLAD, 40% mLAD, 20% mLCx, 30% RI, 30% pRCA, 70% mRCA (2.5 mm Promus DES), 40% dRCA-1, 90% dRCA-2 (2.5 mm Promus DES)   DDD (degenerative disc disease), thoracolumbar    Diverticulosis    Dizziness    Environmental allergies    Full dentures    GERD (gastroesophageal reflux disease) 06/25/2013   H/O bilateral cataract extraction 2019   History of diverticulosis    History of kidney stones    HLD (hyperlipidemia)    Hyperlipidemia, unspecified    Hypertension    Nephrolithiasis    Osteoporosis, post-menopausal    Parkinson disease (HCC)    Pre-diabetes    PVC's (premature ventricular contractions)    Scoliosis    Thoracic kyphosis    Trifascicular bundle branch block    a.) 1st degree AVB + RBBB + LAFB   Vascular disease    Past Surgical History:  Procedure Laterality Date   APPENDECTOMY     BREAST EXCISIONAL BIOPSY Left 1984   neg   BREAST EXCISIONAL BIOPSY Right 1997   lipoma    CATARACT EXTRACTION W/PHACO Left 09/02/2017   Procedure: CATARACT EXTRACTION PHACO AND INTRAOCULAR LENS PLACEMENT (IOC)  LEFT;  Surgeon: Lockie Mola, MD;  Location: [facility];  Service: Ophthalmology;  Laterality: Left;   CATARACT EXTRACTION W/PHACO Right 10/01/2017   Procedure: CATARACT EXTRACTION PHACO AND INTRAOCULAR LENS PLACEMENT (IOC) RIGHT;  Surgeon: Lockie Mola, MD;  Location: [facility];  Service: Ophthalmology;  Laterality: Right;   COLONOSCOPY WITH PROPOFOL N/A 11/27/2016   Procedure: COLONOSCOPY WITH PROPOFOL;  Surgeon: Toledo, Boykin Nearing, MD;  Location: [facility];  Service: Gastroenterology;  Laterality: N/A;   CORONARY ANGIOPLASTY WITH STENT PLACEMENT Left 06/25/2013   Procedure: CORONARY ANGIOPLASTY WITH STENT PLACEMENT; Location: [facility]; Surgeon: Rolly Salter, MD   KNEE ARTHROPLASTY Left 01/22/2017   Procedure: COMPUTER ASSISTED TOTAL KNEE ARTHROPLASTY;  Surgeon: Donato Heinz, MD;  Location: [facility];  Service: Orthopedics;  Laterality: Left;   KNEE ARTHROSCOPY Left 02/01/2015   Procedure: ARTHROSCOPY KNEE, partial medial & lateral menisectomy,,medial and patelofemoral chondroplasty;  Surgeon: Donato Heinz, MD;  Location: [facility];  Service: Orthopedics;  Laterality: Left;   LEFT HEART CATH AND CORONARY ANGIOGRAPHY Left 03/10/2012   Procedure: LEFT HEART CATH AND  CORONARY ANGIOGRAPHY; Location: [facility]; Surgeon: Marcina Millard, MD   LITHOTRIPSY Right 1995   RIGHT AXILLARY LIPOMA REMOVAL     TUBAL LIGATION     VARICOSE VEIN SURGERY     Patient Active Problem List   Diagnosis Date Noted   Primary osteoarthritis of hip 02/09/2017   Primary osteoarthritis of knee 02/09/2017   Chronic sinus bradycardia 01/22/2017   Degenerative disc disease, lumbar 01/22/2017   Dizziness 01/22/2017   Nephrolithiasis 01/22/2017   Osteoporosis, 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/22/2017   PVC's (premature ventricular contractions) 01/22/2017   S/P total knee  arthroplasty 01/22/2017   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   CKD (chronic kidney disease), 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   GERD (gastroesophageal reflux disease) 06/25/2013   Coronary artery disease involving native coronary artery of native heart 06/24/2013   Essential hypertension 06/21/2013   Mixed hyperlipidemia 06/21/2013    PCP: Marguarite Arbour, MD   REFERRING PROVIDER: Meeler, Jodelle Gross, FNP  REFERRING DIAG:  Diagnosis  [phone] (ICD-10-CM) - Lumbar stenosis with neurogenic claudication  M54.14 (ICD-10-CM) - Thoracic radiculitis    Rationale for Evaluation and Treatment: Rehabilitation  THERAPY DIAG:  Muscle weakness (generalized)  Other lack of coordination  Difficulty in walking, not elsewhere classified  Chronic bilateral thoracic back pain  ONSET DATE: >3 months   SUBJECTIVE:                                                                                                                                                                                           SUBJECTIVE STATEMENT:  Pt feeling pretty good today, but a little weak.   PERTINENT HISTORY:  Hx of PD and familiar to this clinic for PD management.  From recent MD appointment:  "The patient is a pleasant 83 year old female who presents today for acute on chronic bilateral low back pain without radiation to the lower extremities. She describes pain that began June 2020 without injury. She also states that she has had some form of back pain for many years. She describes her pain as mild to moderate that is sharp and intermittent. Bending and lifting increases her pain. Rest can help alleviate her pain. She participated in physical therapy at [facility] clinic in early 2020 with little relief. She continues to stay active in her home and notices that household activities increase her pain such as vacuuming and mopping. Medications include Tylenol (mild to moderate relief).  She was last evaluated on  10/09/2022 at which time she was doing well. She was to continue with heat and home cycling along with physical therapy exercises. She was to continue with tramadol nightly as needed.  At today's  visit she reports that she is experience of increasing pain in the morning in her mid back about the bra line. She would like to return to physical therapy and she states that this was very helpful in managing her pain. We discussed if her pain was to continue consideration would be made for another epidural. She states that she is taking tramadol at night and doing well with this. Denies any injury. Upon palpation today she is without tenderness to the thoracic paraspinal musculature. "  PAIN:  Are you having pain? Yes: NPRS scale: 3/10 Pain location: middle back Pain description: ache Aggravating factors: sleeping Relieving factors: tramadol   PRECAUTIONS: Fall   RED FLAGS: None   WEIGHT BEARING RESTRICTIONS: No  FALLS:  Has patient fallen in last 6 months? No  LIVING ENVIRONMENT: Lives with: lives alone and daughter lives next door. Lives in: House/apartment Stairs: Yes: External: 4 steps; on right going up and on left going up Has following equipment at home:  None, but has use cane in the past. Has shower rails, shower chair; doesn't use  OCCUPATION: retired   PLOF: Independent, Independent with basic ADLs, and Independent with gait  PATIENT GOALS: stronger. Improve mobility   NEXT MD VISIT: unsure   OBJECTIVE:  Note: Objective measures were completed at Evaluation unless otherwise noted.  DIAGNOSTIC FINDINGS:  No recent imaging   PATIENT SURVEYS:  Modified Oswestry 28%   COGNITION: Overall cognitive status: Within functional limits for tasks assessed     SENSATION: WFL  MUSCLE LENGTH: WFL  POSTURE: rounded shoulders, forward head, and decreased lumbar lordosis  PALPATION: Noted tightness in upper thoracic paraspinal. No pain to palpation   LUMBAR ROM:   AROM  eval  Flexion WFL  Extension 20  Right lateral flexion   Left lateral flexion   Right rotation minimal  Left rotation ~10 deg   Cervical  AROM eval  Flexion 45  Extension 18  Right lateral flexion 20  Left lateral flexion 12  Right rotation 50  Left rotation  40  Thoracic  AROM eval  Flexion WFL  Extension 10  Right lateral flexion 25  Left lateral flexion 20  Right rotation minimal  Left rotation ~10 deg      (Blank rows = not tested)  LOWER EXTREMITY ROM:     Grossly WFL  LOWER EXTREMITY MMT:    MMT Right eval Left eval  Hip flexion 4- 4-  Hip extension    Hip abduction 4- 4-  Hip adduction 4 4  Hip internal rotation    Hip external rotation    Knee flexion 4 4  Knee extension 4+ 4+  Ankle dorsiflexion 4 4  Ankle plantarflexion    Ankle inversion    Ankle eversion     (Blank rows = not tested)    FUNCTIONAL TESTS:  5 times sit to stand: 16  Timed up and go (TUG): 12.3 sce 6 minute walk test: 1147ft 10 meter walk test: 0.56m/s  GAIT: Distance walked: 70 Assistive device utilized: None Level of assistance: Complete Independence Comments: flexed posture.   TREATMENT DATE: 02/17/2023  NMR: Nustep BUE/BLE reciprocal movement training x 6 min, level 1-4 with cues for full ROM and increased trunk rotation as tolerated.  SPM in 60s. Pt rates medium   BUE Forward reach with neutral tspine into shoulder and trunk extension  X 12 - somewhat fatiguing    Single UE lateral/crossbody reach with weight shift each direction 2 x 12  Sit to stand with UE swing into extension.  X 12 bil   Weighted gait with 4# AW 2x150 ft cues for posture and improved step length and reciprocal arm swing -Rates hard   TA: Seated YTB shoulder ER 2x10 - rates medium   Standing shoulder extension with retraction YTB x 15, x8   Standing mid row x 15 YTB.  X8    Stand doorway pec stretch x 5 with 5 sec hold   Seated with foam roll behind back and UE abduction 2x 12 -  rates medium  Technique with donning jacket/sleeve 4x  Cues for improved posture and cervical alignment intermittently throughout session     PATIENT EDUCATION:  Education details:exercise technique, large-amplitude training/purpose, techniques for donning jacket, finger flicks Person educated: Patient Education method: IT trainer, VC Education comprehension: verbalized understanding, returned demo  HOME EXERCISE PROGRAM: Access Code: [email or web address] URL: [email or web address] Date: 02/20/2023 Prepared by: Grier Rocher  Exercises - Sit to Stand with Arm Swing  - 1 x daily - 7 x weekly - 2 sets - 10 reps - 2 hold - Seated Reach Forward, Up, and To Sides  - 1 x daily - 7 x weekly - 3 sets - 10 reps - Seated Reaching to Side and Across Body  - 1 x daily - 7 x weekly - 3 sets - 10 reps - Standing Row with Anchored Resistance  - 1 x daily - 7 x weekly - 3 sets - 10 reps  ASSESSMENT:  CLINICAL IMPRESSION: Pt able to advance level of resistance with gait with only mild-mod fatigue. Pt tolerated all interventions well and continues to report general improvement in back symptoms although these symptoms have not fully resolved. Pt will benefit from PT to address back pain improve strength, and overall fucntional mobility   OBJECTIVE IMPAIRMENTS: Abnormal gait, cardiopulmonary status limiting activity, decreased activity tolerance, decreased balance, decreased endurance, decreased mobility, difficulty walking, decreased ROM, decreased strength, hypomobility, increased fascial restrictions, impaired perceived functional ability, impaired flexibility, impaired UE functional use, improper body mechanics, and postural dysfunction.   ACTIVITY LIMITATIONS: carrying, squatting, sleeping, and locomotion level  PARTICIPATION LIMITATIONS: cleaning, laundry, shopping, and community activity  PERSONAL FACTORS: Age, Fitness, and 1-2 comorbidities: lumbar stenosis   are also affecting  patient's functional outcome.   REHAB POTENTIAL: Good  CLINICAL DECISION MAKING: Stable/uncomplicated  EVALUATION COMPLEXITY: Low   GOALS: Goals reviewed with patient? Yes   SHORT TERM GOALS: Target date: 03/18/2023    Patient will be independent in home exercise program to improve strength/mobility for better functional independence with ADLs. Baseline: to be given at session 2.  Goal status: INITIAL   LONG TERM GOALS: Target date: 05/12/2023    Patient will increase Mod ODI score by 12.8 points  to demonstrate statistically significant improvement in mobility and quality of life.  Baseline: 28% Goal status: INITIAL  2.  Patient (> 11 years old) will complete five times sit to stand test in < 15 seconds indicating an increased LE strength and improved balance. Baseline: 16 sec  Goal status: INITIAL  3.  Patient will increase 6 min walk test score by >150 to demonstrated improved access to community and improve safety.   Baseline: 1181ft Goal status: INITIAL  4.  Patient will increase 10 meter walk test to >1.33m/s as to improve gait speed for better community ambulation and to reduce fall risk. Baseline: 0.36m/s  Goal status: INITIAL  5.  Patient will reduce timed up and go to <11 seconds to  reduce fall risk and demonstrate improved transfer/gait ability. Baseline: 13sec  Goal status: INITIAL  6.  Patient will reduced back pain to 3/10 at worst to indicate improved function with hold tasks.  Baseline: 9/10 at night.  Goal status: INITIAL  PLAN:  PT FREQUENCY: 1-2x/week  PT DURATION: 12 weeks  PLANNED INTERVENTIONS: 97110-Therapeutic exercises, 97530- Therapeutic activity, [id number]- Neuromuscular re-education, 97535- Self Care, [id number]- Manual therapy, [phone]- Gait training, [id number]- Electrical stimulation (unattended), [phone]- Electrical stimulation (manual), Balance training, Stair training, Taping, Dry Needling, Joint mobilization, Joint manipulation, Spinal  manipulation, Spinal mobilization, Scar mobilization, DME instructions, Cryotherapy, and Moist heat.  PLAN FOR NEXT SESSION:   Continue to address trunk mobility deficits PD treatment.  Continue plan   Baird Kay, PT 03/10/2023, 11:50 AM

## 2023-03-12 ENCOUNTER — Ambulatory Visit: Payer: Medicare Other

## 2023-03-12 ENCOUNTER — Ambulatory Visit: Payer: Medicare Other | Admitting: Physical Therapy

## 2023-03-17 ENCOUNTER — Ambulatory Visit: Payer: Medicare Other | Admitting: Physical Therapy

## 2023-03-19 ENCOUNTER — Ambulatory Visit: Payer: Medicare Other | Admitting: Physical Therapy

## 2023-03-19 ENCOUNTER — Ambulatory Visit: Payer: Medicare Other

## 2023-03-19 DIAGNOSIS — R278 Other lack of coordination: Secondary | ICD-10-CM

## 2023-03-19 DIAGNOSIS — G20A1 Parkinson's disease without dyskinesia, without mention of fluctuations: Secondary | ICD-10-CM

## 2023-03-19 DIAGNOSIS — R262 Difficulty in walking, not elsewhere classified: Secondary | ICD-10-CM

## 2023-03-19 DIAGNOSIS — M6281 Muscle weakness (generalized): Secondary | ICD-10-CM

## 2023-03-19 DIAGNOSIS — R2681 Unsteadiness on feet: Secondary | ICD-10-CM

## 2023-03-19 DIAGNOSIS — G8929 Other chronic pain: Secondary | ICD-10-CM

## 2023-03-19 DIAGNOSIS — R2689 Other abnormalities of gait and mobility: Secondary | ICD-10-CM

## 2023-03-19 NOTE — Therapy (Signed)
 OUTPATIENT PHYSICAL THERAPY THORACOLUMBAR Treatment    Patient Name: Katelyn Crawford MRN: [id number] DOB:1940/04/24, 83 y.o., female Today's Date: 03/19/2023  END OF SESSION:  PT End of Session - 03/19/23 1155     Visit Number 7    Number of Visits 24    Date for PT Re-Evaluation 05/12/23    Progress Note Due on Visit 10    PT Start Time 1146    PT Stop Time 1226    PT Time Calculation (min) 40 min    Equipment Utilized During Treatment Gait belt    Activity Tolerance Patient tolerated treatment well    Behavior During Therapy WFL for tasks assessed/performed               Past Medical History:  Diagnosis Date   Anemia    Aortic atherosclerosis (HCC)    Arthritis    Cardiomegaly    Cardiomyopathy (HCC)    Cholelithiasis    Chronic kidney disease, stage 2 (mild)    Chronic sinus bradycardia    Coronary artery disease 03/10/2012   a.) LHC 03/10/2012: 40% mLAD, 50% pRI, 50% pRCA, 90% mRCA, 50% dRCA, 50% RPDA - unable to cannulate RCA - med mgmt; b.) LHC/PCI 06/25/2013: 30% pLAD, 40% mLAD, 20% mLCx, 30% RI, 30% pRCA, 70% mRCA (2.5 mm Promus DES), 40% dRCA-1, 90% dRCA-2 (2.5 mm Promus DES)   DDD (degenerative disc disease), thoracolumbar    Diverticulosis    Dizziness    Environmental allergies    Full dentures    GERD (gastroesophageal reflux disease) 06/25/2013   H/O bilateral cataract extraction 2019   History of diverticulosis    History of kidney stones    HLD (hyperlipidemia)    Hyperlipidemia, unspecified    Hypertension    Nephrolithiasis    Osteoporosis, post-menopausal    Parkinson disease (HCC)    Pre-diabetes    PVC's (premature ventricular contractions)    Scoliosis    Thoracic kyphosis    Trifascicular bundle branch block    a.) 1st degree AVB + RBBB + LAFB   Vascular disease    Past Surgical History:  Procedure Laterality Date   APPENDECTOMY     BREAST EXCISIONAL BIOPSY Left 1984   neg   BREAST EXCISIONAL BIOPSY Right 1997   lipoma    CATARACT EXTRACTION W/PHACO Left 09/02/2017   Procedure: CATARACT EXTRACTION PHACO AND INTRAOCULAR LENS PLACEMENT (IOC)  LEFT;  Surgeon: Lockie Mola, MD;  Location: [facility];  Service: Ophthalmology;  Laterality: Left;   CATARACT EXTRACTION W/PHACO Right 10/01/2017   Procedure: CATARACT EXTRACTION PHACO AND INTRAOCULAR LENS PLACEMENT (IOC) RIGHT;  Surgeon: Lockie Mola, MD;  Location: [facility];  Service: Ophthalmology;  Laterality: Right;   COLONOSCOPY WITH PROPOFOL N/A 11/27/2016   Procedure: COLONOSCOPY WITH PROPOFOL;  Surgeon: Toledo, Boykin Nearing, MD;  Location: [facility];  Service: Gastroenterology;  Laterality: N/A;   CORONARY ANGIOPLASTY WITH STENT PLACEMENT Left 06/25/2013   Procedure: CORONARY ANGIOPLASTY WITH STENT PLACEMENT; Location: [facility]; Surgeon: Rolly Salter, MD   KNEE ARTHROPLASTY Left 01/22/2017   Procedure: COMPUTER ASSISTED TOTAL KNEE ARTHROPLASTY;  Surgeon: Donato Heinz, MD;  Location: [facility];  Service: Orthopedics;  Laterality: Left;   KNEE ARTHROSCOPY Left 02/01/2015   Procedure: ARTHROSCOPY KNEE, partial medial & lateral menisectomy,,medial and patelofemoral chondroplasty;  Surgeon: Donato Heinz, MD;  Location: [facility];  Service: Orthopedics;  Laterality: Left;   LEFT HEART CATH AND CORONARY ANGIOGRAPHY Left 03/10/2012   Procedure: LEFT HEART CATH AND  CORONARY ANGIOGRAPHY; Location: [facility]; Surgeon: Marcina Millard, MD   LITHOTRIPSY Right 1995   RIGHT AXILLARY LIPOMA REMOVAL     TUBAL LIGATION     VARICOSE VEIN SURGERY     Patient Active Problem List   Diagnosis Date Noted   Primary osteoarthritis of hip 02/09/2017   Primary osteoarthritis of knee 02/09/2017   Chronic sinus bradycardia 01/22/2017   Degenerative disc disease, lumbar 01/22/2017   Dizziness 01/22/2017   Nephrolithiasis 01/22/2017   Osteoporosis, 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/22/2017   PVC's (premature ventricular contractions) 01/22/2017   S/P total knee  arthroplasty 01/22/2017   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   CKD (chronic kidney disease), 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   GERD (gastroesophageal reflux disease) 06/25/2013   Coronary artery disease involving native coronary artery of native heart 06/24/2013   Essential hypertension 06/21/2013   Mixed hyperlipidemia 06/21/2013    PCP: Marguarite Arbour, MD   REFERRING PROVIDER: Meeler, Jodelle Gross, FNP  REFERRING DIAG:  Diagnosis  [phone] (ICD-10-CM) - Lumbar stenosis with neurogenic claudication  M54.14 (ICD-10-CM) - Thoracic radiculitis    Rationale for Evaluation and Treatment: Rehabilitation  THERAPY DIAG:   Muscle weakness (generalized)  Other lack of coordination  Difficulty in walking, not elsewhere classified  Chronic bilateral thoracic back pain  Parkinson's disease, unspecified whether dyskinesia present, unspecified whether manifestations fluctuate (HCC)  Other abnormalities of gait and mobility  Unsteadiness on feet  ONSET DATE: >3 months   SUBJECTIVE:                                                                                                                                                                                           SUBJECTIVE STATEMENT:  Pt feeling pretty good today. States that she had a rough day an Friday with tingling through entire body that lasted a couple hours and gradually improved throughout day.  Has felt much better since then.   PERTINENT HISTORY:  Hx of PD and familiar to this clinic for PD management.  From recent MD appointment:  "The patient is a pleasant 83 year old female who presents today for acute on chronic bilateral low back pain without radiation to the lower extremities. She describes pain that began June 2020 without injury. She also states that she has had some form of back pain for many years. She describes her pain as mild to moderate that is sharp and intermittent. Bending and lifting increases her pain.  Rest can help alleviate her pain. She participated in physical therapy at [facility] in early 2020 with little relief. She continues to stay active in her home and notices that household activities increase her pain such  as vacuuming and mopping. Medications include Tylenol (mild to moderate relief).  She was last evaluated on 10/09/2022 at which time she was doing well. She was to continue with heat and home cycling along with physical therapy exercises. She was to continue with tramadol nightly as needed.  At today's visit she reports that she is experience of increasing pain in the morning in her mid back about the bra line. She would like to return to physical therapy and she states that this was very helpful in managing her pain. We discussed if her pain was to continue consideration would be made for another epidural. She states that she is taking tramadol at night and doing well with this. Denies any injury. Upon palpation today she is without tenderness to the thoracic paraspinal musculature. "  PAIN:  Are you having pain? Yes: NPRS scale: 3/10 Pain location: middle back Pain description: ache Aggravating factors: sleeping Relieving factors: tramadol   PRECAUTIONS: Fall   RED FLAGS: None   WEIGHT BEARING RESTRICTIONS: No  FALLS:  Has patient fallen in last 6 months? No  LIVING ENVIRONMENT: Lives with: lives alone and daughter lives next door. Lives in: House/apartment Stairs: Yes: External: 4 steps; on right going up and on left going up Has following equipment at home:  None, but has use cane in the past. Has shower rails, shower chair; doesn't use  OCCUPATION: retired   PLOF: Independent, Independent with basic ADLs, and Independent with gait  PATIENT GOALS: stronger. Improve mobility   NEXT MD VISIT: unsure   OBJECTIVE:  Note: Objective measures were completed at Evaluation unless otherwise noted.  DIAGNOSTIC FINDINGS:  No recent imaging   PATIENT SURVEYS:   Modified Oswestry 28%   COGNITION: Overall cognitive status: Within functional limits for tasks assessed     SENSATION: WFL  MUSCLE LENGTH: WFL  POSTURE: rounded shoulders, forward head, and decreased lumbar lordosis  PALPATION: Noted tightness in upper thoracic paraspinal. No pain to palpation   LUMBAR ROM:   AROM eval  Flexion WFL  Extension 20  Right lateral flexion   Left lateral flexion   Right rotation minimal  Left rotation ~10 deg   Cervical  AROM eval  Flexion 45  Extension 18  Right lateral flexion 20  Left lateral flexion 12  Right rotation 50  Left rotation  40  Thoracic  AROM eval  Flexion WFL  Extension 10  Right lateral flexion 25  Left lateral flexion 20  Right rotation minimal  Left rotation ~10 deg      (Blank rows = not tested)  LOWER EXTREMITY ROM:     Grossly WFL  LOWER EXTREMITY MMT:    MMT Right eval Left eval  Hip flexion 4- 4-  Hip extension    Hip abduction 4- 4-  Hip adduction 4 4  Hip internal rotation    Hip external rotation    Knee flexion 4 4  Knee extension 4+ 4+  Ankle dorsiflexion 4 4  Ankle plantarflexion    Ankle inversion    Ankle eversion     (Blank rows = not tested)    FUNCTIONAL TESTS:  5 times sit to stand: 16  Timed up and go (TUG): 12.3 sce 6 minute walk test: 1170ft 10 meter walk test: 0.59m/s  GAIT: Distance walked: 70 Assistive device utilized: None Level of assistance: Complete Independence Comments: flexed posture.   TREATMENT DATE: 02/17/2023 Gait belt donned throughout session and  CGA for safety and reduced fall risk.  NMR: Nustep BUE/BLE reciprocal movement training x 6 min, level 1-4 with cues for full ROM and increased trunk rotation as tolerated.  SPM in 60s.   1 foot on 4 inch step 2 x 20 sec without UE support.  Foot tap on 6 inch step x 15 bil with out UE supprot  Foot tap on 4 inch step with UE horizontal abduction x 12 bil  Lateral foot tap with contralateral  vertical reach x 12 bil  No UE support   Standing on airex pad.  Normal BOS 2 x 30 sec  Normal BOS eyes open/eyes closed x 4, 10 sec each  Foot tap on 4 inch step x 15  No UE support   Sit to stand with UE swing into extension.  X 12 bil with min assist for sequencing over movement.   Alternating hip flexion/shoulder flexion with contralateral extremities. X 15; attempted for second bout, but only able to complete 8 prior to significant difficulty with sequencing.  Cues for improved posture and cervical alignment intermittently throughout session      PATIENT EDUCATION:  Education details:exercise technique, large-amplitude training/purpose, techniques for donning jacket, finger flicks Person educated: Patient Education method: IT trainer, VC Education comprehension: verbalized understanding, returned demo  HOME EXERCISE PROGRAM: Access Code: [email or web address] URL: [email or web address] Date: 02/20/2023 Prepared by: Grier Rocher  Exercises - Sit to Stand with Arm Swing  - 1 x daily - 7 x weekly - 2 sets - 10 reps - 2 hold - Seated Reach Forward, Up, and To Sides  - 1 x daily - 7 x weekly - 3 sets - 10 reps - Seated Reaching to Side and Across Body  - 1 x daily - 7 x weekly - 3 sets - 10 reps - Standing Row with Anchored Resistance  - 1 x daily - 7 x weekly - 3 sets - 10 reps  ASSESSMENT:  CLINICAL IMPRESSION: Pt arrived motivated to participate. PT treatment focused on improved reciprocal movement in BLE/BUE with increased challenge in standing with use of stand contralateral reaches to improve trunk rotation and reach posture.  Pt will benefit from PT to address back pain improve strength, and overall fucntional mobility   OBJECTIVE IMPAIRMENTS: Abnormal gait, cardiopulmonary status limiting activity, decreased activity tolerance, decreased balance, decreased endurance, decreased mobility, difficulty walking, decreased ROM, decreased strength, hypomobility,  increased fascial restrictions, impaired perceived functional ability, impaired flexibility, impaired UE functional use, improper body mechanics, and postural dysfunction.   ACTIVITY LIMITATIONS: carrying, squatting, sleeping, and locomotion level  PARTICIPATION LIMITATIONS: cleaning, laundry, shopping, and community activity  PERSONAL FACTORS: Age, Fitness, and 1-2 comorbidities: lumbar stenosis   are also affecting patient's functional outcome.   REHAB POTENTIAL: Good  CLINICAL DECISION MAKING: Stable/uncomplicated  EVALUATION COMPLEXITY: Low   GOALS: Goals reviewed with patient? Yes   SHORT TERM GOALS: Target date: 03/18/2023    Patient will be independent in home exercise program to improve strength/mobility for better functional independence with ADLs. Baseline: to be given at session 2.  Goal status: INITIAL   LONG TERM GOALS: Target date: 05/12/2023    Patient will increase Mod ODI score by 12.8 points  to demonstrate statistically significant improvement in mobility and quality of life.  Baseline: 28% Goal status: INITIAL  2.  Patient (> 43 years old) will complete five times sit to stand test in < 15 seconds indicating an increased LE strength and improved balance. Baseline: 16 sec  Goal status: INITIAL  3.  Patient will increase 6  min walk test score by >150 to demonstrated improved access to community and improve safety.   Baseline: 1177ft Goal status: INITIAL  4.  Patient will increase 10 meter walk test to >1.42m/s as to improve gait speed for better community ambulation and to reduce fall risk. Baseline: 0.6m/s  Goal status: INITIAL  5.  Patient will reduce timed up and go to <11 seconds to reduce fall risk and demonstrate improved transfer/gait ability. Baseline: 13sec  Goal status: INITIAL  6.  Patient will reduced back pain to 3/10 at worst to indicate improved function with hold tasks.  Baseline: 9/10 at night.  Goal status: INITIAL  PLAN:  PT  FREQUENCY: 1-2x/week  PT DURATION: 12 weeks  PLANNED INTERVENTIONS: 97110-Therapeutic exercises, 97530- Therapeutic activity, [id number]- Neuromuscular re-education, 97535- Self Care, [id number]- Manual therapy, [phone]- Gait training, [id number]- Electrical stimulation (unattended), [phone]- Electrical stimulation (manual), Balance training, Stair training, Taping, Dry Needling, Joint mobilization, Joint manipulation, Spinal manipulation, Spinal mobilization, Scar mobilization, DME instructions, Cryotherapy, and Moist heat.  PLAN FOR NEXT SESSION:   Continue to address trunk mobility deficits PD treatment.  Continue plan   Golden Pop, PT 03/19/2023, 11:56 AM

## 2023-03-26 ENCOUNTER — Ambulatory Visit: Payer: Medicare Other | Admitting: Physical Therapy

## 2023-03-26 ENCOUNTER — Ambulatory Visit: Payer: Medicare Other

## 2023-03-27 NOTE — Therapy (Signed)
 OUTPATIENT PHYSICAL THERAPY THORACOLUMBAR Treatment    Patient Name: SHAKENYA STONEBERG MRN: [id number] DOB:May 04, 1940, 83 y.o., female Today's Date: 03/28/2023  END OF SESSION:  PT End of Session - 03/28/23 0838     Visit Number 8    Number of Visits 24    Date for PT Re-Evaluation 05/12/23    Progress Note Due on Visit 10    PT Start Time 0840    PT Stop Time 0930    PT Time Calculation (min) 50 min    Equipment Utilized During Treatment Gait belt    Activity Tolerance Patient tolerated treatment well    Behavior During Therapy WFL for tasks assessed/performed                Past Medical History:  Diagnosis Date   Anemia    Aortic atherosclerosis (HCC)    Arthritis    Cardiomegaly    Cardiomyopathy (HCC)    Cholelithiasis    Chronic kidney disease, stage 2 (mild)    Chronic sinus bradycardia    Coronary artery disease 03/10/2012   a.) LHC 03/10/2012: 40% mLAD, 50% pRI, 50% pRCA, 90% mRCA, 50% dRCA, 50% RPDA - unable to cannulate RCA - med mgmt; b.) LHC/PCI 06/25/2013: 30% pLAD, 40% mLAD, 20% mLCx, 30% RI, 30% pRCA, 70% mRCA (2.5 mm Promus DES), 40% dRCA-1, 90% dRCA-2 (2.5 mm Promus DES)   DDD (degenerative disc disease), thoracolumbar    Diverticulosis    Dizziness    Environmental allergies    Full dentures    GERD (gastroesophageal reflux disease) 06/25/2013   H/O bilateral cataract extraction 2019   History of diverticulosis    History of kidney stones    HLD (hyperlipidemia)    Hyperlipidemia, unspecified    Hypertension    Nephrolithiasis    Osteoporosis, post-menopausal    Parkinson disease (HCC)    Pre-diabetes    PVC's (premature ventricular contractions)    Scoliosis    Thoracic kyphosis    Trifascicular bundle branch block    a.) 1st degree AVB + RBBB + LAFB   Vascular disease    Past Surgical History:  Procedure Laterality Date   APPENDECTOMY     BREAST EXCISIONAL BIOPSY Left 1984   neg   BREAST EXCISIONAL BIOPSY Right 1997   lipoma    CATARACT EXTRACTION W/PHACO Left 09/02/2017   Procedure: CATARACT EXTRACTION PHACO AND INTRAOCULAR LENS PLACEMENT (IOC)  LEFT;  Surgeon: Lockie Mola, MD;  Location: [facility];  Service: Ophthalmology;  Laterality: Left;   CATARACT EXTRACTION W/PHACO Right 10/01/2017   Procedure: CATARACT EXTRACTION PHACO AND INTRAOCULAR LENS PLACEMENT (IOC) RIGHT;  Surgeon: Lockie Mola, MD;  Location: [facility];  Service: Ophthalmology;  Laterality: Right;   COLONOSCOPY WITH PROPOFOL N/A 11/27/2016   Procedure: COLONOSCOPY WITH PROPOFOL;  Surgeon: Toledo, Boykin Nearing, MD;  Location: [facility];  Service: Gastroenterology;  Laterality: N/A;   CORONARY ANGIOPLASTY WITH STENT PLACEMENT Left 06/25/2013   Procedure: CORONARY ANGIOPLASTY WITH STENT PLACEMENT; Location: [facility]; Surgeon: Rolly Salter, MD   KNEE ARTHROPLASTY Left 01/22/2017   Procedure: COMPUTER ASSISTED TOTAL KNEE ARTHROPLASTY;  Surgeon: Donato Heinz, MD;  Location: [facility];  Service: Orthopedics;  Laterality: Left;   KNEE ARTHROSCOPY Left 02/01/2015   Procedure: ARTHROSCOPY KNEE, partial medial & lateral menisectomy,,medial and patelofemoral chondroplasty;  Surgeon: Donato Heinz, MD;  Location: [facility];  Service: Orthopedics;  Laterality: Left;   LEFT HEART CATH AND CORONARY ANGIOGRAPHY Left 03/10/2012   Procedure: LEFT HEART CATH  AND CORONARY ANGIOGRAPHY; Location: [facility]; Surgeon: Marcina Millard, MD   LITHOTRIPSY Right 1995   RIGHT AXILLARY LIPOMA REMOVAL     TUBAL LIGATION     VARICOSE VEIN SURGERY     Patient Active Problem List   Diagnosis Date Noted   Primary osteoarthritis of hip 02/09/2017   Primary osteoarthritis of knee 02/09/2017   Chronic sinus bradycardia 01/22/2017   Degenerative disc disease, lumbar 01/22/2017   Dizziness 01/22/2017   Nephrolithiasis 01/22/2017   Osteoporosis, 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/22/2017   PVC's (premature ventricular contractions) 01/22/2017   S/P total knee  arthroplasty 01/22/2017   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   CKD (chronic kidney disease), 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   GERD (gastroesophageal reflux disease) 06/25/2013   Coronary artery disease involving native coronary artery of native heart 06/24/2013   Essential hypertension 06/21/2013   Mixed hyperlipidemia 06/21/2013    PCP: Marguarite Arbour, MD   REFERRING PROVIDER: Meeler, Jodelle Gross, FNP  REFERRING DIAG:  Diagnosis  [phone] (ICD-10-CM) - Lumbar stenosis with neurogenic claudication  M54.14 (ICD-10-CM) - Thoracic radiculitis    Rationale for Evaluation and Treatment: Rehabilitation  THERAPY DIAG:   Muscle weakness (generalized)  Other lack of coordination  Difficulty in walking, not elsewhere classified  Chronic bilateral thoracic back pain  Parkinson's disease, unspecified whether dyskinesia present, unspecified whether manifestations fluctuate (HCC)  Other abnormalities of gait and mobility  Unsteadiness on feet  ONSET DATE: >3 months   SUBJECTIVE:                                                                                                                                                                                           SUBJECTIVE STATEMENT:  Pt reports overall feeling much better this week than last week.    PERTINENT HISTORY:  Hx of PD and familiar to this clinic for PD management.  From recent MD appointment:  "The patient is a pleasant 83 year old female who presents today for acute on chronic bilateral low back pain without radiation to the lower extremities. She describes pain that began June 2020 without injury. She also states that she has had some form of back pain for many years. She describes her pain as mild to moderate that is sharp and intermittent. Bending and lifting increases her pain. Rest can help alleviate her pain. She participated in physical therapy at [facility] clinic in early 2020 with little relief. She continues to  stay active in her home and notices that household activities increase her pain such as vacuuming and mopping. Medications include Tylenol (mild to moderate relief).  She was last evaluated on 10/09/2022 at which time she was  doing well. She was to continue with heat and home cycling along with physical therapy exercises. She was to continue with tramadol nightly as needed.  At today's visit she reports that she is experience of increasing pain in the morning in her mid back about the bra line. She would like to return to physical therapy and she states that this was very helpful in managing her pain. We discussed if her pain was to continue consideration would be made for another epidural. She states that she is taking tramadol at night and doing well with this. Denies any injury. Upon palpation today she is without tenderness to the thoracic paraspinal musculature. "  PAIN:  Are you having pain? Yes: NPRS scale: 3/10 Pain location: middle back Pain description: ache Aggravating factors: sleeping Relieving factors: tramadol   PRECAUTIONS: Fall   RED FLAGS: None   WEIGHT BEARING RESTRICTIONS: No  FALLS:  Has patient fallen in last 6 months? No  LIVING ENVIRONMENT: Lives with: lives alone and daughter lives next door. Lives in: House/apartment Stairs: Yes: External: 4 steps; on right going up and on left going up Has following equipment at home:  None, but has use cane in the past. Has shower rails, shower chair; doesn't use  OCCUPATION: retired   PLOF: Independent, Independent with basic ADLs, and Independent with gait  PATIENT GOALS: stronger. Improve mobility   NEXT MD VISIT: unsure   OBJECTIVE:  Note: Objective measures were completed at Evaluation unless otherwise noted.  DIAGNOSTIC FINDINGS:  No recent imaging   PATIENT SURVEYS:  Modified Oswestry 28%   COGNITION: Overall cognitive status: Within functional limits for tasks assessed     SENSATION: WFL  MUSCLE  LENGTH: WFL  POSTURE: rounded shoulders, forward head, and decreased lumbar lordosis  PALPATION: Noted tightness in upper thoracic paraspinal. No pain to palpation   LUMBAR ROM:   AROM eval  Flexion WFL  Extension 20  Right lateral flexion   Left lateral flexion   Right rotation minimal  Left rotation ~10 deg   Cervical  AROM eval  Flexion 45  Extension 18  Right lateral flexion 20  Left lateral flexion 12  Right rotation 50  Left rotation  40  Thoracic  AROM eval  Flexion WFL  Extension 10  Right lateral flexion 25  Left lateral flexion 20  Right rotation minimal  Left rotation ~10 deg      (Blank rows = not tested)  LOWER EXTREMITY ROM:     Grossly WFL  LOWER EXTREMITY MMT:    MMT Right eval Left eval  Hip flexion 4- 4-  Hip extension    Hip abduction 4- 4-  Hip adduction 4 4  Hip internal rotation    Hip external rotation    Knee flexion 4 4  Knee extension 4+ 4+  Ankle dorsiflexion 4 4  Ankle plantarflexion    Ankle inversion    Ankle eversion     (Blank rows = not tested)    FUNCTIONAL TESTS:  5 times sit to stand: 16  Timed up and go (TUG): 12.3 sce 6 minute walk test: 1154ft 10 meter walk test: 0.31m/s  GAIT: Distance walked: 70 Assistive device utilized: None Level of assistance: Complete Independence Comments: flexed posture.   TREATMENT DATE: 02/17/2023 Gait belt donned throughout session and  CGA for safety and reduced fall risk.   NMR: Nustep BUE/BLE reciprocal movement training x 6 min, level 1 SPM in 60-70s. Total time 7 min at 0.25 mi. Focusing  on reciprocal movement patterns.    Dynamic high knee march forward down in // bars and retro step bwd on way back to start x 10 (VC for increased step height and most challenging with retro steps- step to gait pattern)   Bwd walking- focusing on increasing hip ext- mod VC and later visual demo and patient did improve from initially step to shuffle to more reciprocal  steps.  Side stepping in // bars - down and back without UE support x 8 (VC for wide steps and upright posture)   Postural awareness:   Scap retraction- active in front of mirror- 2 sets of 10 reps (VC and visual demo for correct technique)  Horizontal shoulder abd-Active in front of mirror- 2 sets of 10 reps     Therapeutic Activities:  Sit to stand with UE swing into extension.  X 12 bil with min assist for sequencing over movement.   Posterior chain for posture and improved overall strength - Standing hip ext 3# AW alt LE 2 x 10 reps -Standing ham curl 3# AW alt LE 2 x 10 reps -Standing calf raises 3# AW alt LE 2 x 10 reps     Cues for improved posture and cervical alignment intermittently throughout session      PATIENT EDUCATION:  Education details:exercise technique, large-amplitude training/purpose, techniques for donning jacket, finger flicks Person educated: Patient Education method: IT trainer, VC Education comprehension: verbalized understanding, returned demo  HOME EXERCISE PROGRAM: Access Code: [email or web address] URL: [email or web address] Date: 02/20/2023 Prepared by: Grier Rocher  Exercises - Sit to Stand with Arm Swing  - 1 x daily - 7 x weekly - 2 sets - 10 reps - 2 hold - Seated Reach Forward, Up, and To Sides  - 1 x daily - 7 x weekly - 3 sets - 10 reps - Seated Reaching to Side and Across Body  - 1 x daily - 7 x weekly - 3 sets - 10 reps - Standing Row with Anchored Resistance  - 1 x daily - 7 x weekly - 3 sets - 10 reps  ASSESSMENT:  CLINICAL IMPRESSION: Treatment continued to focus on improving coordination and some postural awareness to improve her overall ability to stand and walk. She was most challenged today with retro walking and exhibiting some posterior LE weakness. Focused on improving backward step length and she did demonstrate some progress in session with practice. She denied any pain with LE strengthening - only fatigue  today.   Pt will benefit from PT to address back pain improve strength, and overall fucntional mobility   OBJECTIVE IMPAIRMENTS: Abnormal gait, cardiopulmonary status limiting activity, decreased activity tolerance, decreased balance, decreased endurance, decreased mobility, difficulty walking, decreased ROM, decreased strength, hypomobility, increased fascial restrictions, impaired perceived functional ability, impaired flexibility, impaired UE functional use, improper body mechanics, and postural dysfunction.   ACTIVITY LIMITATIONS: carrying, squatting, sleeping, and locomotion level  PARTICIPATION LIMITATIONS: cleaning, laundry, shopping, and community activity  PERSONAL FACTORS: Age, Fitness, and 1-2 comorbidities: lumbar stenosis   are also affecting patient's functional outcome.   REHAB POTENTIAL: Good  CLINICAL DECISION MAKING: Stable/uncomplicated  EVALUATION COMPLEXITY: Low   GOALS: Goals reviewed with patient? Yes   SHORT TERM GOALS: Target date: 03/18/2023    Patient will be independent in home exercise program to improve strength/mobility for better functional independence with ADLs. Baseline: to be given at session 2.  Goal status: INITIAL   LONG TERM GOALS: Target date: 05/12/2023    Patient will increase Mod ODI  score by 12.8 points  to demonstrate statistically significant improvement in mobility and quality of life.  Baseline: 28% Goal status: INITIAL  2.  Patient (> 52 years old) will complete five times sit to stand test in < 15 seconds indicating an increased LE strength and improved balance. Baseline: 16 sec  Goal status: INITIAL  3.  Patient will increase 6 min walk test score by >150 to demonstrated improved access to community and improve safety.   Baseline: 1119ft Goal status: INITIAL  4.  Patient will increase 10 meter walk test to >1.4m/s as to improve gait speed for better community ambulation and to reduce fall risk. Baseline: 0.78m/s  Goal  status: INITIAL  5.  Patient will reduce timed up and go to <11 seconds to reduce fall risk and demonstrate improved transfer/gait ability. Baseline: 13sec  Goal status: INITIAL  6.  Patient will reduced back pain to 3/10 at worst to indicate improved function with hold tasks.  Baseline: 9/10 at night.  Goal status: INITIAL  PLAN:  PT FREQUENCY: 1-2x/week  PT DURATION: 12 weeks  PLANNED INTERVENTIONS: 97110-Therapeutic exercises, 97530- Therapeutic activity, [id number]- Neuromuscular re-education, 97535- Self Care, [id number]- Manual therapy, [phone]- Gait training, [id number]- Electrical stimulation (unattended), [phone]- Electrical stimulation (manual), Balance training, Stair training, Taping, Dry Needling, Joint mobilization, Joint manipulation, Spinal manipulation, Spinal mobilization, Scar mobilization, DME instructions, Cryotherapy, and Moist heat.  PLAN FOR NEXT SESSION:   Continue to address trunk mobility deficits PD treatment.  Continue with balance and LE strengthening.    Lenda Kelp, PT 03/28/2023, 10:40 AM

## 2023-03-28 ENCOUNTER — Ambulatory Visit: Payer: Medicare Other | Attending: Family Medicine

## 2023-03-28 DIAGNOSIS — M6281 Muscle weakness (generalized): Secondary | ICD-10-CM | POA: Insufficient documentation

## 2023-03-28 DIAGNOSIS — R278 Other lack of coordination: Secondary | ICD-10-CM | POA: Insufficient documentation

## 2023-03-28 DIAGNOSIS — R2689 Other abnormalities of gait and mobility: Secondary | ICD-10-CM | POA: Insufficient documentation

## 2023-03-28 DIAGNOSIS — M546 Pain in thoracic spine: Secondary | ICD-10-CM | POA: Insufficient documentation

## 2023-03-28 DIAGNOSIS — G20A1 Parkinson's disease without dyskinesia, without mention of fluctuations: Secondary | ICD-10-CM | POA: Insufficient documentation

## 2023-03-28 DIAGNOSIS — R2681 Unsteadiness on feet: Secondary | ICD-10-CM | POA: Insufficient documentation

## 2023-03-28 DIAGNOSIS — R262 Difficulty in walking, not elsewhere classified: Secondary | ICD-10-CM | POA: Insufficient documentation

## 2023-03-28 DIAGNOSIS — G8929 Other chronic pain: Secondary | ICD-10-CM | POA: Insufficient documentation

## 2023-03-31 ENCOUNTER — Ambulatory Visit: Payer: Medicare Other | Admitting: Physical Therapy

## 2023-03-31 DIAGNOSIS — R2681 Unsteadiness on feet: Secondary | ICD-10-CM

## 2023-03-31 DIAGNOSIS — M6281 Muscle weakness (generalized): Secondary | ICD-10-CM | POA: Diagnosis not present

## 2023-03-31 DIAGNOSIS — G20A1 Parkinson's disease without dyskinesia, without mention of fluctuations: Secondary | ICD-10-CM

## 2023-03-31 DIAGNOSIS — G8929 Other chronic pain: Secondary | ICD-10-CM

## 2023-03-31 DIAGNOSIS — R278 Other lack of coordination: Secondary | ICD-10-CM

## 2023-03-31 DIAGNOSIS — R262 Difficulty in walking, not elsewhere classified: Secondary | ICD-10-CM

## 2023-03-31 DIAGNOSIS — R2689 Other abnormalities of gait and mobility: Secondary | ICD-10-CM

## 2023-03-31 NOTE — Therapy (Signed)
 OUTPATIENT PHYSICAL THERAPY THORACOLUMBAR Treatment    Patient Name: DEMIKA LANGENDERFER MRN: [id number] DOB:March 08, 1940, 83 y.o., female Today's Date: 03/31/2023  END OF SESSION:  PT End of Session - 03/31/23 0849     Visit Number 9    Number of Visits 24    Date for PT Re-Evaluation 05/12/23    Progress Note Due on Visit 10    PT Start Time 0849    PT Stop Time 0929    PT Time Calculation (min) 40 min    Equipment Utilized During Treatment Gait belt    Activity Tolerance Patient tolerated treatment well    Behavior During Therapy WFL for tasks assessed/performed                Past Medical History:  Diagnosis Date   Anemia    Aortic atherosclerosis (HCC)    Arthritis    Cardiomegaly    Cardiomyopathy (HCC)    Cholelithiasis    Chronic kidney disease, stage 2 (mild)    Chronic sinus bradycardia    Coronary artery disease 03/10/2012   a.) LHC 03/10/2012: 40% mLAD, 50% pRI, 50% pRCA, 90% mRCA, 50% dRCA, 50% RPDA - unable to cannulate RCA - med mgmt; b.) LHC/PCI 06/25/2013: 30% pLAD, 40% mLAD, 20% mLCx, 30% RI, 30% pRCA, 70% mRCA (2.5 mm Promus DES), 40% dRCA-1, 90% dRCA-2 (2.5 mm Promus DES)   DDD (degenerative disc disease), thoracolumbar    Diverticulosis    Dizziness    Environmental allergies    Full dentures    GERD (gastroesophageal reflux disease) 06/25/2013   H/O bilateral cataract extraction 2019   History of diverticulosis    History of kidney stones    HLD (hyperlipidemia)    Hyperlipidemia, unspecified    Hypertension    Nephrolithiasis    Osteoporosis, post-menopausal    Parkinson disease (HCC)    Pre-diabetes    PVC's (premature ventricular contractions)    Scoliosis    Thoracic kyphosis    Trifascicular bundle branch block    a.) 1st degree AVB + RBBB + LAFB   Vascular disease    Past Surgical History:  Procedure Laterality Date   APPENDECTOMY     BREAST EXCISIONAL BIOPSY Left 1984   neg   BREAST EXCISIONAL BIOPSY Right 1997   lipoma    CATARACT EXTRACTION W/PHACO Left 09/02/2017   Procedure: CATARACT EXTRACTION PHACO AND INTRAOCULAR LENS PLACEMENT (IOC)  LEFT;  Surgeon: Lockie Mola, MD;  Location: [facility];  Service: Ophthalmology;  Laterality: Left;   CATARACT EXTRACTION W/PHACO Right 10/01/2017   Procedure: CATARACT EXTRACTION PHACO AND INTRAOCULAR LENS PLACEMENT (IOC) RIGHT;  Surgeon: Lockie Mola, MD;  Location: [facility];  Service: Ophthalmology;  Laterality: Right;   COLONOSCOPY WITH PROPOFOL N/A 11/27/2016   Procedure: COLONOSCOPY WITH PROPOFOL;  Surgeon: Toledo, Boykin Nearing, MD;  Location: [facility];  Service: Gastroenterology;  Laterality: N/A;   CORONARY ANGIOPLASTY WITH STENT PLACEMENT Left 06/25/2013   Procedure: CORONARY ANGIOPLASTY WITH STENT PLACEMENT; Location: [facility]; Surgeon: Rolly Salter, MD   KNEE ARTHROPLASTY Left 01/22/2017   Procedure: COMPUTER ASSISTED TOTAL KNEE ARTHROPLASTY;  Surgeon: Donato Heinz, MD;  Location: [facility];  Service: Orthopedics;  Laterality: Left;   KNEE ARTHROSCOPY Left 02/01/2015   Procedure: ARTHROSCOPY KNEE, partial medial & lateral menisectomy,,medial and patelofemoral chondroplasty;  Surgeon: Donato Heinz, MD;  Location: [facility];  Service: Orthopedics;  Laterality: Left;   LEFT HEART CATH AND CORONARY ANGIOGRAPHY Left 03/10/2012   Procedure: LEFT HEART CATH  AND CORONARY ANGIOGRAPHY; Location: [facility]; Surgeon: Marcina Millard, MD   LITHOTRIPSY Right 1995   RIGHT AXILLARY LIPOMA REMOVAL     TUBAL LIGATION     VARICOSE VEIN SURGERY     Patient Active Problem List   Diagnosis Date Noted   Primary osteoarthritis of hip 02/09/2017   Primary osteoarthritis of knee 02/09/2017   Chronic sinus bradycardia 01/22/2017   Degenerative disc disease, lumbar 01/22/2017   Dizziness 01/22/2017   Nephrolithiasis 01/22/2017   Osteoporosis, 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/22/2017   PVC's (premature ventricular contractions) 01/22/2017   S/P total knee  arthroplasty 01/22/2017   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   CKD (chronic kidney disease), 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   GERD (gastroesophageal reflux disease) 06/25/2013   Coronary artery disease involving native coronary artery of native heart 06/24/2013   Essential hypertension 06/21/2013   Mixed hyperlipidemia 06/21/2013    PCP: Marguarite Arbour, MD   REFERRING PROVIDER: Meeler, Jodelle Gross, FNP  REFERRING DIAG:  Diagnosis  M48.062 (ICD-10-CM) - Lumbar stenosis with neurogenic claudication  M54.14 (ICD-10-CM) - Thoracic radiculitis    Rationale for Evaluation and Treatment: Rehabilitation  THERAPY DIAG:   No diagnosis found.  ONSET DATE: >3 months   SUBJECTIVE:                                                                                                                                                                                           SUBJECTIVE STATEMENT:  Pt reports waking up this morning with mild/moderate pain in mid back. Does not rate. States that she cooked lunch for family on Saturday and Sunday, overdoing it, and feeling a little back pain since then. Did not take any pain medicine this morning.   PERTINENT HISTORY:  Hx of PD and familiar to this clinic for PD management.  From recent MD appointment:  "The patient is a pleasant 83 year old female who presents today for acute on chronic bilateral low back pain without radiation to the lower extremities. She describes pain that began June 2020 without injury. She also states that she has had some form of back pain for many years. She describes her pain as mild to moderate that is sharp and intermittent. Bending and lifting increases her pain. Rest can help alleviate her pain. She participated in physical therapy at [facility] clinic in early 2020 with little relief. She continues to stay active in her home and notices that household activities increase her pain such as vacuuming and mopping. Medications  include Tylenol (mild to moderate relief).  She was last evaluated on 10/09/2022 at which time she was doing well. She was to continue with heat and  home cycling along with physical therapy exercises. She was to continue with tramadol nightly as needed.  At today's visit she reports that she is experience of increasing pain in the morning in her mid back about the bra line. She would like to return to physical therapy and she states that this was very helpful in managing her pain. We discussed if her pain was to continue consideration would be made for another epidural. She states that she is taking tramadol at night and doing well with this. Denies any injury. Upon palpation today she is without tenderness to the thoracic paraspinal musculature. "  PAIN:  Are you having pain? Yes: NPRS scale: 3/10 Pain location: middle back Pain description: ache Aggravating factors: sleeping Relieving factors: tramadol   PRECAUTIONS: Fall   RED FLAGS: None   WEIGHT BEARING RESTRICTIONS: No  FALLS:  Has patient fallen in last 6 months? No  LIVING ENVIRONMENT: Lives with: lives alone and daughter lives next door. Lives in: House/apartment Stairs: Yes: External: 4 steps; on right going up and on left going up Has following equipment at home:  None, but has use cane in the past. Has shower rails, shower chair; doesn't use  OCCUPATION: retired   PLOF: Independent, Independent with basic ADLs, and Independent with gait  PATIENT GOALS: stronger. Improve mobility   NEXT MD VISIT: unsure   OBJECTIVE:  Note: Objective measures were completed at Evaluation unless otherwise noted.  DIAGNOSTIC FINDINGS:  No recent imaging   PATIENT SURVEYS:  Modified Oswestry 28%   COGNITION: Overall cognitive status: Within functional limits for tasks assessed     SENSATION: WFL  MUSCLE LENGTH: WFL  POSTURE: rounded shoulders, forward head, and decreased lumbar lordosis  PALPATION: Noted tightness in  upper thoracic paraspinal. No pain to palpation   LUMBAR ROM:   AROM eval  Flexion WFL  Extension 20  Right lateral flexion   Left lateral flexion   Right rotation minimal  Left rotation ~10 deg   Cervical  AROM eval  Flexion 45  Extension 18  Right lateral flexion 20  Left lateral flexion 12  Right rotation 50  Left rotation  40  Thoracic  AROM eval  Flexion WFL  Extension 10  Right lateral flexion 25  Left lateral flexion 20  Right rotation minimal  Left rotation ~10 deg      (Blank rows = not tested)  LOWER EXTREMITY ROM:     Grossly WFL  LOWER EXTREMITY MMT:    MMT Right eval Left eval  Hip flexion 4- 4-  Hip extension    Hip abduction 4- 4-  Hip adduction 4 4  Hip internal rotation    Hip external rotation    Knee flexion 4 4  Knee extension 4+ 4+  Ankle dorsiflexion 4 4  Ankle plantarflexion    Ankle inversion    Ankle eversion     (Blank rows = not tested)    FUNCTIONAL TESTS:  5 times sit to stand: 16  Timed up and go (TUG): 12.3 sce 6 minute walk test: 1155ft 10 meter walk test: 0.84m/s  GAIT: Distance walked: 70 Assistive device utilized: None Level of assistance: Complete Independence Comments: flexed posture.   TREATMENT DATE: 02/17/2023   NMR: Octane BUE/BLE reciprocal movement training x 6 min, level 3-4  SPM in 60-70s. Total time 6 min with 3 therapeutic rest breaks at 2 min and 4 min  at 0.25 mi. Focusing on reciprocal movement patterns.    Seated mid row  with foam roll across mid back to improve thoracic/lumbar extension. 7.5# 3 x 15.   Seated shoulder flexion into extension with ER  2 x 15  Seated shoulder flexion to end range with cues for low trap activation x 15  Seated bil lateral horizontal abduction to perform cross body rotation to clap contralateral hand x 12 bil cues for improved mid and low trap activation.  Reciprocal Step over cane x 12 bil  Reciprocal step over cane with shoulder flexion full range with  extension in starting position. Tactile cues for proper UE sequencing.   Manual:  STM to UT and levator scapulae 3 x 3 min with TP realease for pain management in bil shoulders and upper back.    PATIENT EDUCATION:  Education details:exercise technique, large-amplitude training/purpose, techniques for donning jacket, finger flicks Person educated: Patient Education method: IT trainer, VC Education comprehension: verbalized understanding, returned demo  HOME EXERCISE PROGRAM: Access Code: [email or web address] URL: [email or web address] Date: 02/20/2023 Prepared by: Grier Rocher  Exercises - Sit to Stand with Arm Swing  - 1 x daily - 7 x weekly - 2 sets - 10 reps - 2 hold - Seated Reach Forward, Up, and To Sides  - 1 x daily - 7 x weekly - 3 sets - 10 reps - Seated Reaching to Side and Across Body  - 1 x daily - 7 x weekly - 3 sets - 10 reps - Standing Row with Anchored Resistance  - 1 x daily - 7 x weekly - 3 sets - 10 reps  ASSESSMENT:  CLINICAL IMPRESSION: Treatment continued to focus on improved posture and parascapular strengthening to address back pain. PT increased resistance and repetitions with targeted activation of middle and lower traps. Continues to demonstrate reduced coordination with combined BLE/BUE movements to address PD and back pain in conjunctions.   Pt will benefit from PT to address back pain improve strength, and overall fucntional mobility   OBJECTIVE IMPAIRMENTS: Abnormal gait, cardiopulmonary status limiting activity, decreased activity tolerance, decreased balance, decreased endurance, decreased mobility, difficulty walking, decreased ROM, decreased strength, hypomobility, increased fascial restrictions, impaired perceived functional ability, impaired flexibility, impaired UE functional use, improper body mechanics, and postural dysfunction.   ACTIVITY LIMITATIONS: carrying, squatting, sleeping, and locomotion level  PARTICIPATION LIMITATIONS:  cleaning, laundry, shopping, and community activity  PERSONAL FACTORS: Age, Fitness, and 1-2 comorbidities: lumbar stenosis   are also affecting patient's functional outcome.   REHAB POTENTIAL: Good  CLINICAL DECISION MAKING: Stable/uncomplicated  EVALUATION COMPLEXITY: Low   GOALS: Goals reviewed with patient? Yes   SHORT TERM GOALS: Target date: 03/18/2023    Patient will be independent in home exercise program to improve strength/mobility for better functional independence with ADLs. Baseline: to be given at session 2.  Goal status: INITIAL   LONG TERM GOALS: Target date: 05/12/2023    Patient will increase Mod ODI score by 12.8 points  to demonstrate statistically significant improvement in mobility and quality of life.  Baseline: 28% Goal status: INITIAL  2.  Patient (> 62 years old) will complete five times sit to stand test in < 15 seconds indicating an increased LE strength and improved balance. Baseline: 16 sec  Goal status: INITIAL  3.  Patient will increase 6 min walk test score by >150 to demonstrated improved access to community and improve safety.   Baseline: 1132ft Goal status: INITIAL  4.  Patient will increase 10 meter walk test to >1.36m/s as to improve gait speed for better community ambulation and to reduce  fall risk. Baseline: 0.45m/s  Goal status: INITIAL  5.  Patient will reduce timed up and go to <11 seconds to reduce fall risk and demonstrate improved transfer/gait ability. Baseline: 13sec  Goal status: INITIAL  6.  Patient will reduced back pain to 3/10 at worst to indicate improved function with hold tasks.  Baseline: 9/10 at night.  Goal status: INITIAL  PLAN:  PT FREQUENCY: 1-2x/week  PT DURATION: 12 weeks  PLANNED INTERVENTIONS: 97110-Therapeutic exercises, 97530- Therapeutic activity, [id number]- Neuromuscular re-education, 97535- Self Care, [id number]- Manual therapy, [phone]- Gait training, [id number]- Electrical stimulation (unattended), [phone]-  Electrical stimulation (manual), Balance training, Stair training, Taping, Dry Needling, Joint mobilization, Joint manipulation, Spinal manipulation, Spinal mobilization, Scar mobilization, DME instructions, Cryotherapy, and Moist heat.  PLAN FOR NEXT SESSION:   Progress note  Continue to address back pain and trunk mobility deficits PD treatment. Continue with balance and LE strengthening, if back pain tolerates.    Golden Pop, PT 03/31/2023, 8:50 AM

## 2023-04-02 ENCOUNTER — Ambulatory Visit: Payer: Medicare Other | Admitting: Physical Therapy

## 2023-04-02 DIAGNOSIS — R2681 Unsteadiness on feet: Secondary | ICD-10-CM

## 2023-04-02 DIAGNOSIS — R278 Other lack of coordination: Secondary | ICD-10-CM

## 2023-04-02 DIAGNOSIS — G8929 Other chronic pain: Secondary | ICD-10-CM

## 2023-04-02 DIAGNOSIS — G20A1 Parkinson's disease without dyskinesia, without mention of fluctuations: Secondary | ICD-10-CM

## 2023-04-02 DIAGNOSIS — R2689 Other abnormalities of gait and mobility: Secondary | ICD-10-CM

## 2023-04-02 DIAGNOSIS — R262 Difficulty in walking, not elsewhere classified: Secondary | ICD-10-CM

## 2023-04-02 DIAGNOSIS — M6281 Muscle weakness (generalized): Secondary | ICD-10-CM | POA: Diagnosis not present

## 2023-04-02 NOTE — Therapy (Signed)
 OUTPATIENT PHYSICAL THERAPY THORACOLUMBAR Treatment/  PHYSICAL THERAPY PROGRESS NOTE   Dates of reporting period  02/19/2022   to   04/02/2023     Patient Name: Katelyn Crawford MRN: [id number] DOB:September 17, 1940, 83 y.o., female Today's Date: 04/02/2023  END OF SESSION:  PT End of Session - 04/02/23 1022     Visit Number 10    Number of Visits 24    Date for PT Re-Evaluation 05/12/23    Progress Note Due on Visit 10    PT Start Time 1017    PT Stop Time 1057    PT Time Calculation (min) 40 min    Equipment Utilized During Treatment Gait belt    Activity Tolerance Patient tolerated treatment well    Behavior During Therapy WFL for tasks assessed/performed                Past Medical History:  Diagnosis Date   Anemia    Aortic atherosclerosis (HCC)    Arthritis    Cardiomegaly    Cardiomyopathy (HCC)    Cholelithiasis    Chronic kidney disease, stage 2 (mild)    Chronic sinus bradycardia    Coronary artery disease 03/10/2012   a.) LHC 03/10/2012: 40% mLAD, 50% pRI, 50% pRCA, 90% mRCA, 50% dRCA, 50% RPDA - unable to cannulate RCA - med mgmt; b.) LHC/PCI 06/25/2013: 30% pLAD, 40% mLAD, 20% mLCx, 30% RI, 30% pRCA, 70% mRCA (2.5 mm Promus DES), 40% dRCA-1, 90% dRCA-2 (2.5 mm Promus DES)   DDD (degenerative disc disease), thoracolumbar    Diverticulosis    Dizziness    Environmental allergies    Full dentures    GERD (gastroesophageal reflux disease) 06/25/2013   H/O bilateral cataract extraction 2019   History of diverticulosis    History of kidney stones    HLD (hyperlipidemia)    Hyperlipidemia, unspecified    Hypertension    Nephrolithiasis    Osteoporosis, post-menopausal    Parkinson disease (HCC)    Pre-diabetes    PVC's (premature ventricular contractions)    Scoliosis    Thoracic kyphosis    Trifascicular bundle branch block    a.) 1st degree AVB + RBBB + LAFB   Vascular disease    Past Surgical History:  Procedure Laterality Date   APPENDECTOMY      BREAST EXCISIONAL BIOPSY Left 1984   neg   BREAST EXCISIONAL BIOPSY Right 1997   lipoma   CATARACT EXTRACTION W/PHACO Left 09/02/2017   Procedure: CATARACT EXTRACTION PHACO AND INTRAOCULAR LENS PLACEMENT (IOC)  LEFT;  Surgeon: Lockie Mola, MD;  Location: [facility];  Service: Ophthalmology;  Laterality: Left;   CATARACT EXTRACTION W/PHACO Right 10/01/2017   Procedure: CATARACT EXTRACTION PHACO AND INTRAOCULAR LENS PLACEMENT (IOC) RIGHT;  Surgeon: Lockie Mola, MD;  Location: [facility];  Service: Ophthalmology;  Laterality: Right;   COLONOSCOPY WITH PROPOFOL N/A 11/27/2016   Procedure: COLONOSCOPY WITH PROPOFOL;  Surgeon: Toledo, Boykin Nearing, MD;  Location: [facility];  Service: Gastroenterology;  Laterality: N/A;   CORONARY ANGIOPLASTY WITH STENT PLACEMENT Left 06/25/2013   Procedure: CORONARY ANGIOPLASTY WITH STENT PLACEMENT; Location: [facility]; Surgeon: Rolly Salter, MD   KNEE ARTHROPLASTY Left 01/22/2017   Procedure: COMPUTER ASSISTED TOTAL KNEE ARTHROPLASTY;  Surgeon: Donato Heinz, MD;  Location: [facility];  Service: Orthopedics;  Laterality: Left;   KNEE ARTHROSCOPY Left 02/01/2015   Procedure: ARTHROSCOPY KNEE, partial medial & lateral menisectomy,,medial and patelofemoral chondroplasty;  Surgeon: Donato Heinz, MD;  Location: [facility];  Service:  Orthopedics;  Laterality: Left;   LEFT HEART CATH AND CORONARY ANGIOGRAPHY Left 03/10/2012   Procedure: LEFT HEART CATH AND CORONARY ANGIOGRAPHY; Location: [facility]; Surgeon: Marcina Millard, MD   LITHOTRIPSY Right 1995   RIGHT AXILLARY LIPOMA REMOVAL     TUBAL LIGATION     VARICOSE VEIN SURGERY     Patient Active Problem List   Diagnosis Date Noted   Primary osteoarthritis of hip 02/09/2017   Primary osteoarthritis of knee 02/09/2017   Chronic sinus bradycardia 01/22/2017   Degenerative disc disease, lumbar 01/22/2017   Dizziness 01/22/2017   Nephrolithiasis 01/22/2017   Osteoporosis, post-menopausal  01/22/2017   PVC's (premature ventricular contractions) 01/22/2017   S/P total knee arthroplasty 01/22/2017   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   CKD (chronic kidney disease), 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   GERD (gastroesophageal reflux disease) 06/25/2013   Coronary artery disease involving native coronary artery of native heart 06/24/2013   Essential hypertension 06/21/2013   Mixed hyperlipidemia 06/21/2013    PCP: Marguarite Arbour, MD   REFERRING PROVIDER: Meeler, Jodelle Gross, FNP  REFERRING DIAG:  Diagnosis  [phone] (ICD-10-CM) - Lumbar stenosis with neurogenic claudication  M54.14 (ICD-10-CM) - Thoracic radiculitis    Rationale for Evaluation and Treatment: Rehabilitation  THERAPY DIAG:   Muscle weakness (generalized)  Other lack of coordination  Difficulty in walking, not elsewhere classified  Chronic bilateral thoracic back pain  Parkinson's disease, unspecified whether dyskinesia present, unspecified whether manifestations fluctuate (HCC)  Other abnormalities of gait and mobility  Unsteadiness on feet  ONSET DATE: >3 months   SUBJECTIVE:                                                                                                                                                                                           SUBJECTIVE STATEMENT:  Pt reports that the last 2 weeks have been better overall, but she has been hurting a little in the upper back. Rates 0/10 at start of PT treatment, but 5/10 when getting dressed this morning. Did not take any pain meds.   PERTINENT HISTORY:  Hx of PD and familiar to this clinic for PD management.  From recent MD appointment:  "The patient is a pleasant 83 year old female who presents today for acute on chronic bilateral low back pain without radiation to the lower extremities. She describes pain that began June 2020 without injury. She also states that she has had some form of back pain for many years. She describes  her pain as mild to moderate that is sharp and intermittent. Bending and lifting increases her pain. Rest can help alleviate her pain. She participated in physical  therapy at [facility] clinic in early 2020 with little relief. She continues to stay active in her home and notices that household activities increase her pain such as vacuuming and mopping. Medications include Tylenol (mild to moderate relief).  She was last evaluated on 10/09/2022 at which time she was doing well. She was to continue with heat and home cycling along with physical therapy exercises. She was to continue with tramadol nightly as needed.  At today's visit she reports that she is experience of increasing pain in the morning in her mid back about the bra line. She would like to return to physical therapy and she states that this was very helpful in managing her pain. We discussed if her pain was to continue consideration would be made for another epidural. She states that she is taking tramadol at night and doing well with this. Denies any injury. Upon palpation today she is without tenderness to the thoracic paraspinal musculature. "  PAIN:  Are you having pain? Yes: NPRS scale: 0/10 Pain location: middle back Pain description: ache Aggravating factors: sleeping Relieving factors: tramadol   PRECAUTIONS: Fall   RED FLAGS: None   WEIGHT BEARING RESTRICTIONS: No  FALLS:  Has patient fallen in last 6 months? No  LIVING ENVIRONMENT: Lives with: lives alone and daughter lives next door. Lives in: House/apartment Stairs: Yes: External: 4 steps; on right going up and on left going up Has following equipment at home:  None, but has use cane in the past. Has shower rails, shower chair; doesn't use  OCCUPATION: retired   PLOF: Independent, Independent with basic ADLs, and Independent with gait  PATIENT GOALS: stronger. Improve mobility   NEXT MD VISIT: unsure   OBJECTIVE:  Note: Objective measures were completed at  Evaluation unless otherwise noted.  DIAGNOSTIC FINDINGS:  No recent imaging   PATIENT SURVEYS:  Modified Oswestry 28%   COGNITION: Overall cognitive status: Within functional limits for tasks assessed     SENSATION: WFL  MUSCLE LENGTH: WFL  POSTURE: rounded shoulders, forward head, and decreased lumbar lordosis  PALPATION: Noted tightness in upper thoracic paraspinal. No pain to palpation   LUMBAR ROM:   AROM eval  Flexion WFL  Extension 20  Right lateral flexion   Left lateral flexion   Right rotation minimal  Left rotation ~10 deg   Cervical  AROM eval  Flexion 45  Extension 18  Right lateral flexion 20  Left lateral flexion 12  Right rotation 50  Left rotation  40  Thoracic  AROM eval  Flexion WFL  Extension 10  Right lateral flexion 25  Left lateral flexion 20  Right rotation minimal  Left rotation ~10 deg      (Blank rows = not tested)  LOWER EXTREMITY ROM:     Grossly WFL  LOWER EXTREMITY MMT:    MMT Right eval Left eval  Hip flexion 4- 4-  Hip extension    Hip abduction 4- 4-  Hip adduction 4 4  Hip internal rotation    Hip external rotation    Knee flexion 4 4  Knee extension 4+ 4+  Ankle dorsiflexion 4 4  Ankle plantarflexion    Ankle inversion    Ankle eversion     (Blank rows = not tested)    FUNCTIONAL TESTS:  5 times sit to stand: 16  Timed up and go (TUG): 12.3 sce 6 minute walk test: 1170ft 10 meter walk test: 0.72m/s  GAIT: Distance walked: 70 Assistive device utilized: None Level  of assistance: Complete Independence Comments: flexed posture.   TREATMENT DATE: 02/17/2023   NMR: Nustep  BUE/BLE reciprocal movement training x 6 min, level 3-4  SPM in 60-70s. No rest break required on this day.   PT instructed pt in progress not assessment to measure progress note towards LTG   Pt performed 5 time sit<>stand (5xSTS): 11.81. sec  sec (>15 sec indicates increased fall risk)    6 Min Walk Test:  Instructed  patient to ambulate as quickly and as safely as possible for 6 minutes using LRAD. Patient was allowed to take standing rest breaks without stopping the test, but if the patient required a sitting rest break the clock would be stopped and the test would be over.  Results: 1269ft feet; no assist no AD.  Results indicate that the patient has reduced endurance with ambulation compared to age matched norms.  Age Matched Norms: 26-69 yo M: 34 F: 16, 43-79 yo M: 50 F: 471, 37-89 yo M: 417 F: 392 MDC: 58.21 meters (190.98 feet) or 50 meters (ANPTA Core Set of Outcome Measures for Adults with Neurologic Conditions, 2018)  10 Meter Walk Test: Patient instructed to walk 10 meters (32.8 ft) as quickly and as safely as possible at their normal speed x2 and at a fast speed x2. Time measured from 2 meter mark to 8 meter mark to accommodate ramp-up and ramp-down.   Average Normal speed: 1.74m/s  Cut off scores: <0.4 m/s = household Ambulator, 0.4-0.8 m/s = limited community Ambulator, >0.8 m/s = community Ambulator, >1.2 m/s = crossing a street, <1.0 = increased fall risk MCID 0.05 m/s (small), 0.13 m/s (moderate), 0.06 m/s (significant)  (ANPTA Core Set of Outcome Measures for Adults with Neurologic Conditions, 2018)   PATIENT EDUCATION:  Education details:exercise technique, large-amplitude training/purpose, techniques for donning jacket, finger flicks Person educated: Patient Education method: IT trainer, VC Education comprehension: verbalized understanding, returned demo  HOME EXERCISE PROGRAM: Access Code: [email or web address] URL: [email or web address] Date: 02/20/2023 Prepared by: Grier Rocher  Exercises - Sit to Stand with Arm Swing  - 1 x daily - 7 x weekly - 2 sets - 10 reps - 2 hold - Seated Reach Forward, Up, and To Sides  - 1 x daily - 7 x weekly - 3 sets - 10 reps - Seated Reaching to Side and Across Body  - 1 x daily - 7 x weekly - 3 sets - 10 reps - Standing Row with  Anchored Resistance  - 1 x daily - 7 x weekly - 3 sets - 10 reps  ASSESSMENT:  CLINICAL IMPRESSION: Treatment consisted of progress note assessment to measure progress towards LTG. Pt reports inconsistent HEP compliance, but improved balance, gait, and overall reduced back pain. Pt noted to have reduced fall risk noted in gait speed of 1.10m/s, reduced 5x STS to<12sec, and increased distance on 6 min walk test to >1292ft ft. Pain is still present at night, but improved from eval.  Patient's condition has the potential to improve in response to therapy. Maximum improvement is yet to be obtained. The anticipated improvement is attainable and reasonable in a generally predictable time.  Pt will benefit from PT to address back pain improve strength, and overall fucntional mobility   OBJECTIVE IMPAIRMENTS: Abnormal gait, cardiopulmonary status limiting activity, decreased activity tolerance, decreased balance, decreased endurance, decreased mobility, difficulty walking, decreased ROM, decreased strength, hypomobility, increased fascial restrictions, impaired perceived functional ability, impaired flexibility, impaired UE functional use, improper body mechanics, and postural dysfunction.  ACTIVITY LIMITATIONS: carrying, squatting, sleeping, and locomotion level  PARTICIPATION LIMITATIONS: cleaning, laundry, shopping, and community activity  PERSONAL FACTORS: Age, Fitness, and 1-2 comorbidities: lumbar stenosis   are also affecting patient's functional outcome.   REHAB POTENTIAL: Good  CLINICAL DECISION MAKING: Stable/uncomplicated  EVALUATION COMPLEXITY: Low   GOALS: Goals reviewed with patient? Yes   SHORT TERM GOALS: Target date: 03/18/2023    Patient will be independent in home exercise program to improve strength/mobility for better functional independence with ADLs. Baseline: to be given at session 2.  Goal status: INITIAL   LONG TERM GOALS: Target date: 05/12/2023    Patient will  improve Mod ODI score by 12.8 points  to demonstrate statistically significant improvement in mobility and quality of life.  Baseline: 28% 3/12: 26%  Goal status: INITIAL  2.  Patient (> 39 years old) will complete five times sit to stand test in < 15 seconds indicating an increased LE strength and improved balance. Baseline: 16 sec  3/12: 11.81. sec  Goal status: MET  3.  Patient will increase 6 min walk test score by >150 to demonstrated improved access to community and improve safety.   Baseline: 1178ft 3/12: 1258ft Goal status: INITIAL  4.  Patient will increase 10 meter walk test to >1.39m/s as to improve gait speed for better community ambulation and to reduce fall risk. Baseline: 0.17m/s  3/12: 1.61m/s Goal status: MET  5.  Patient will reduce timed up and go to <11 seconds to reduce fall risk and demonstrate improved transfer/gait ability. Baseline: 13sec  3/12; 12.5sec  Goal status: IN PROGRESS  6.  Patient will reduced back pain to 3/10 at worst to indicate improved function with hold tasks.  Baseline: 9/10 at night.  3/12: 8/10, when waking up in the morning Goal status: IN PROGRESS  PLAN:  PT FREQUENCY: 1-2x/week  PT DURATION: 12 weeks  PLANNED INTERVENTIONS: 97110-Therapeutic exercises, 97530- Therapeutic activity, 97112- Neuromuscular re-education, 97535- Self Care, [id number]- Manual therapy, 97116- Gait training, 97014- Electrical stimulation (unattended), [phone]- Electrical stimulation (manual), Balance training, Stair training, Taping, Dry Needling, Joint mobilization, Joint manipulation, Spinal manipulation, Spinal mobilization, Scar mobilization, DME instructions, Cryotherapy, and Moist heat.  PLAN FOR NEXT SESSION:   Continue to address back pain and trunk mobility deficits PD treatment. Continue with balance and LE strengthening, if back pain tolerates.    Golden Pop, PT 04/02/2023, 10:23 AM

## 2023-04-07 ENCOUNTER — Ambulatory Visit: Payer: Medicare Other | Admitting: Physical Therapy

## 2023-04-07 DIAGNOSIS — G8929 Other chronic pain: Secondary | ICD-10-CM

## 2023-04-07 DIAGNOSIS — R278 Other lack of coordination: Secondary | ICD-10-CM

## 2023-04-07 DIAGNOSIS — G20A1 Parkinson's disease without dyskinesia, without mention of fluctuations: Secondary | ICD-10-CM

## 2023-04-07 DIAGNOSIS — M6281 Muscle weakness (generalized): Secondary | ICD-10-CM | POA: Diagnosis not present

## 2023-04-07 DIAGNOSIS — R262 Difficulty in walking, not elsewhere classified: Secondary | ICD-10-CM

## 2023-04-07 DIAGNOSIS — R2689 Other abnormalities of gait and mobility: Secondary | ICD-10-CM

## 2023-04-07 DIAGNOSIS — R2681 Unsteadiness on feet: Secondary | ICD-10-CM

## 2023-04-07 NOTE — Therapy (Signed)
 OUTPATIENT PHYSICAL THERAPY THORACOLUMBAR Treatment   Patient Name: Katelyn Crawford MRN: [id number] DOB:January 10, 1941, 83 y.o., female Today's Date: 04/07/2023  END OF SESSION:  PT End of Session - 04/07/23 0900     Visit Number 11    Number of Visits 24    Date for PT Re-Evaluation 05/12/23    Progress Note Due on Visit 10    PT Start Time [phone]    PT Stop Time 0930    PT Time Calculation (min) 38 min    Equipment Utilized During Treatment Gait belt    Activity Tolerance Patient tolerated treatment well    Behavior During Therapy WFL for tasks assessed/performed                Past Medical History:  Diagnosis Date   Anemia    Aortic atherosclerosis (HCC)    Arthritis    Cardiomegaly    Cardiomyopathy (HCC)    Cholelithiasis    Chronic kidney disease, stage 2 (mild)    Chronic sinus bradycardia    Coronary artery disease 03/10/2012   a.) LHC 03/10/2012: 40% mLAD, 50% pRI, 50% pRCA, 90% mRCA, 50% dRCA, 50% RPDA - unable to cannulate RCA - med mgmt; b.) LHC/PCI 06/25/2013: 30% pLAD, 40% mLAD, 20% mLCx, 30% RI, 30% pRCA, 70% mRCA (2.5 mm Promus DES), 40% dRCA-1, 90% dRCA-2 (2.5 mm Promus DES)   DDD (degenerative disc disease), thoracolumbar    Diverticulosis    Dizziness    Environmental allergies    Full dentures    GERD (gastroesophageal reflux disease) 06/25/2013   H/O bilateral cataract extraction 2019   History of diverticulosis    History of kidney stones    HLD (hyperlipidemia)    Hyperlipidemia, unspecified    Hypertension    Nephrolithiasis    Osteoporosis, post-menopausal    Parkinson disease (HCC)    Pre-diabetes    PVC's (premature ventricular contractions)    Scoliosis    Thoracic kyphosis    Trifascicular bundle branch block    a.) 1st degree AVB + RBBB + LAFB   Vascular disease    Past Surgical History:  Procedure Laterality Date   APPENDECTOMY     BREAST EXCISIONAL BIOPSY Left 1984   neg   BREAST EXCISIONAL BIOPSY Right 1997   lipoma    CATARACT EXTRACTION W/PHACO Left 09/02/2017   Procedure: CATARACT EXTRACTION PHACO AND INTRAOCULAR LENS PLACEMENT (IOC)  LEFT;  Surgeon: Lockie Mola, MD;  Location: [facility];  Service: Ophthalmology;  Laterality: Left;   CATARACT EXTRACTION W/PHACO Right 10/01/2017   Procedure: CATARACT EXTRACTION PHACO AND INTRAOCULAR LENS PLACEMENT (IOC) RIGHT;  Surgeon: Lockie Mola, MD;  Location: [facility];  Service: Ophthalmology;  Laterality: Right;   COLONOSCOPY WITH PROPOFOL N/A 11/27/2016   Procedure: COLONOSCOPY WITH PROPOFOL;  Surgeon: Toledo, Boykin Nearing, MD;  Location: [facility];  Service: Gastroenterology;  Laterality: N/A;   CORONARY ANGIOPLASTY WITH STENT PLACEMENT Left 06/25/2013   Procedure: CORONARY ANGIOPLASTY WITH STENT PLACEMENT; Location: [facility]; Surgeon: Rolly Salter, MD   KNEE ARTHROPLASTY Left 01/22/2017   Procedure: COMPUTER ASSISTED TOTAL KNEE ARTHROPLASTY;  Surgeon: Donato Heinz, MD;  Location: [facility];  Service: Orthopedics;  Laterality: Left;   KNEE ARTHROSCOPY Left 02/01/2015   Procedure: ARTHROSCOPY KNEE, partial medial & lateral menisectomy,,medial and patelofemoral chondroplasty;  Surgeon: Donato Heinz, MD;  Location: [facility];  Service: Orthopedics;  Laterality: Left;   LEFT HEART CATH AND CORONARY ANGIOGRAPHY Left 03/10/2012   Procedure: LEFT HEART CATH AND  CORONARY ANGIOGRAPHY; Location: [facility]; Surgeon: Marcina Millard, MD   LITHOTRIPSY Right 1995   RIGHT AXILLARY LIPOMA REMOVAL     TUBAL LIGATION     VARICOSE VEIN SURGERY     Patient Active Problem List   Diagnosis Date Noted   Primary osteoarthritis of hip 02/09/2017   Primary osteoarthritis of knee 02/09/2017   Chronic sinus bradycardia 01/22/2017   Degenerative disc disease, lumbar 01/22/2017   Dizziness 01/22/2017   Nephrolithiasis 01/22/2017   Osteoporosis, 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/22/2017   PVC's (premature ventricular contractions) 01/22/2017   S/P total knee  arthroplasty 01/22/2017   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   CKD (chronic kidney disease), 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   GERD (gastroesophageal reflux disease) 06/25/2013   Coronary artery disease involving native coronary artery of native heart 06/24/2013   Essential hypertension 06/21/2013   Mixed hyperlipidemia 06/21/2013    PCP: Marguarite Arbour, MD   REFERRING PROVIDER: Meeler, Jodelle Gross, FNP  REFERRING DIAG:  Diagnosis  [phone] (ICD-10-CM) - Lumbar stenosis with neurogenic claudication  M54.14 (ICD-10-CM) - Thoracic radiculitis    Rationale for Evaluation and Treatment: Rehabilitation  THERAPY DIAG:   Muscle weakness (generalized)  Other lack of coordination  Chronic bilateral thoracic back pain  Parkinson's disease, unspecified whether dyskinesia present, unspecified whether manifestations fluctuate (HCC)  Other abnormalities of gait and mobility  Difficulty in walking, not elsewhere classified  Unsteadiness on feet  ONSET DATE: >3 months   SUBJECTIVE:                                                                                                                                                                                           SUBJECTIVE STATEMENT:  Pt reports that she is feeling okay, this morning, but was hurting over the weekend and was feeling "out of it" with fluctuating BP. States that BP was 170-180 in the morning yesterday and today.    PERTINENT HISTORY:  Hx of PD and familiar to this clinic for PD management.  From recent MD appointment:  "The patient is a pleasant 83 year old female who presents today for acute on chronic bilateral low back pain without radiation to the lower extremities. She describes pain that began June 2020 without injury. She also states that she has had some form of back pain for many years. She describes her pain as mild to moderate that is sharp and intermittent. Bending and lifting increases her pain. Rest can  help alleviate her pain. She participated in physical therapy at [facility] clinic in early 2020 with little relief. She continues to stay active in her home and notices that household activities increase her pain such  as vacuuming and mopping. Medications include Tylenol (mild to moderate relief).  She was last evaluated on 10/09/2022 at which time she was doing well. She was to continue with heat and home cycling along with physical therapy exercises. She was to continue with tramadol nightly as needed.  At today's visit she reports that she is experience of increasing pain in the morning in her mid back about the bra line. She would like to return to physical therapy and she states that this was very helpful in managing her pain. We discussed if her pain was to continue consideration would be made for another epidural. She states that she is taking tramadol at night and doing well with this. Denies any injury. Upon palpation today she is without tenderness to the thoracic paraspinal musculature. "  PAIN:  Are you having pain? Yes: NPRS scale: 0/10 Pain location: middle back Pain description: ache Aggravating factors: sleeping Relieving factors: tramadol   PRECAUTIONS: Fall   RED FLAGS: None   WEIGHT BEARING RESTRICTIONS: No  FALLS:  Has patient fallen in last 6 months? No  LIVING ENVIRONMENT: Lives with: lives alone and daughter lives next door. Lives in: House/apartment Stairs: Yes: External: 4 steps; on right going up and on left going up Has following equipment at home:  None, but has use cane in the past. Has shower rails, shower chair; doesn't use  OCCUPATION: retired   PLOF: Independent, Independent with basic ADLs, and Independent with gait  PATIENT GOALS: stronger. Improve mobility   NEXT MD VISIT: unsure   OBJECTIVE:  Note: Objective measures were completed at Evaluation unless otherwise noted.  DIAGNOSTIC FINDINGS:  No recent imaging   PATIENT SURVEYS:  Modified  Oswestry 28%   COGNITION: Overall cognitive status: Within functional limits for tasks assessed     SENSATION: WFL  MUSCLE LENGTH: WFL  POSTURE: rounded shoulders, forward head, and decreased lumbar lordosis  PALPATION: Noted tightness in upper thoracic paraspinal. No pain to palpation   LUMBAR ROM:   AROM eval  Flexion WFL  Extension 20  Right lateral flexion   Left lateral flexion   Right rotation minimal  Left rotation ~10 deg   Cervical  AROM eval  Flexion 45  Extension 18  Right lateral flexion 20  Left lateral flexion 12  Right rotation 50  Left rotation  40  Thoracic  AROM eval  Flexion WFL  Extension 10  Right lateral flexion 25  Left lateral flexion 20  Right rotation minimal  Left rotation ~10 deg      (Blank rows = not tested)  LOWER EXTREMITY ROM:     Grossly WFL  LOWER EXTREMITY MMT:    MMT Right eval Left eval  Hip flexion 4- 4-  Hip extension    Hip abduction 4- 4-  Hip adduction 4 4  Hip internal rotation    Hip external rotation    Knee flexion 4 4  Knee extension 4+ 4+  Ankle dorsiflexion 4 4  Ankle plantarflexion    Ankle inversion    Ankle eversion     (Blank rows = not tested)    FUNCTIONAL TESTS:  5 times sit to stand: 16  Timed up and go (TUG): 12.3 sce 6 minute walk test: 1165ft 10 meter walk test: 0.74m/s  GAIT: Distance walked: 70 Assistive device utilized: None Level of assistance: Complete Independence Comments: flexed posture.   TREATMENT DATE: 02/17/2023   TA  Nustep  BUE/BLE reciprocal movement training x 6 min, level 1-4  SPM in 60-70s. No rest break required on this day.   Orthostatic BP assessed:  Sitting. 117/63(80) HR 75 Standing:  0 min: 105/60(72) HR 78 2 min : 115/66(81) HR 83  Sit<>stand with UE swing into extension x 12( starting BUE on thighs on this day.  Cross body/lateral reach x 10 with cues for scapular rhythm and trunk rotation.  Weighted gait training with cues for improved  trunk rotation 2 x 343ft.   Pt required prolonged rest break follow each bout of weighted gait.    PATIENT EDUCATION:  Education details:exercise technique, large-amplitude training/purpose, techniques for donning jacket, finger flicks Person educated: Patient Education method: IT trainer, VC Education comprehension: verbalized understanding, returned demo  HOME EXERCISE PROGRAM: Access Code: [email or web address] URL: [email or web address] Date: 02/20/2023 Prepared by: Grier Rocher  Exercises - Sit to Stand with Arm Swing  - 1 x daily - 7 x weekly - 2 sets - 10 reps - 2 hold - Seated Reach Forward, Up, and To Sides  - 1 x daily - 7 x weekly - 3 sets - 10 reps - Seated Reaching to Side and Across Body  - 1 x daily - 7 x weekly - 3 sets - 10 reps - Standing Row with Anchored Resistance  - 1 x daily - 7 x weekly - 3 sets - 10 reps  ASSESSMENT:  CLINICAL IMPRESSION: Pt put forth excellent effort towards therapeutic interventions. PT treatment focused on improved posture, scapular mobility, and trunk activation with functional movement patterns. Pt tolerated treatment well, but required cues for full ROM with trunk rotation to the R. Pt will benefit from PT to address back pain improve strength, and overall fucntional mobility   OBJECTIVE IMPAIRMENTS: Abnormal gait, cardiopulmonary status limiting activity, decreased activity tolerance, decreased balance, decreased endurance, decreased mobility, difficulty walking, decreased ROM, decreased strength, hypomobility, increased fascial restrictions, impaired perceived functional ability, impaired flexibility, impaired UE functional use, improper body mechanics, and postural dysfunction.   ACTIVITY LIMITATIONS: carrying, squatting, sleeping, and locomotion level  PARTICIPATION LIMITATIONS: cleaning, laundry, shopping, and community activity  PERSONAL FACTORS: Age, Fitness, and 1-2 comorbidities: lumbar stenosis   are also affecting  patient's functional outcome.   REHAB POTENTIAL: Good  CLINICAL DECISION MAKING: Stable/uncomplicated  EVALUATION COMPLEXITY: Low   GOALS: Goals reviewed with patient? Yes   SHORT TERM GOALS: Target date: 03/18/2023    Patient will be independent in home exercise program to improve strength/mobility for better functional independence with ADLs. Baseline: to be given at session 2.  Goal status: INITIAL   LONG TERM GOALS: Target date: 05/12/2023    Patient will improve Mod ODI score by 12.8 points  to demonstrate statistically significant improvement in mobility and quality of life.  Baseline: 28% 3/12: 26%  Goal status: INITIAL  2.  Patient (> 66 years old) will complete five times sit to stand test in < 15 seconds indicating an increased LE strength and improved balance. Baseline: 16 sec  3/12: 11.81. sec  Goal status: MET  3.  Patient will increase 6 min walk test score by >150 to demonstrated improved access to community and improve safety.   Baseline: 1167ft 3/12: 1225ft Goal status: INITIAL  4.  Patient will increase 10 meter walk test to >1.13m/s as to improve gait speed for better community ambulation and to reduce fall risk. Baseline: 0.2m/s  3/12: 1.59m/s Goal status: MET  5.  Patient will reduce timed up and go to <11 seconds to reduce fall risk and demonstrate improved transfer/gait ability.  Baseline: 13sec  3/12; 12.5sec  Goal status: IN PROGRESS  6.  Patient will reduced back pain to 3/10 at worst to indicate improved function with hold tasks.  Baseline: 9/10 at night.  3/12: 8/10, when waking up in the morning Goal status: IN PROGRESS  PLAN:  PT FREQUENCY: 1-2x/week  PT DURATION: 12 weeks  PLANNED INTERVENTIONS: 97110-Therapeutic exercises, 97530- Therapeutic activity, 97112- Neuromuscular re-education, 97535- Self Care, 63875- Manual therapy, 97116- Gait training, 97014- Electrical stimulation (unattended), [phone]- Electrical stimulation  (manual), Balance training, Stair training, Taping, Dry Needling, Joint mobilization, Joint manipulation, Spinal manipulation, Spinal mobilization, Scar mobilization, DME instructions, Cryotherapy, and Moist heat.  PLAN FOR NEXT SESSION:   Continue to address back pain and trunk mobility deficits PD treatment. Continue with balance and LE strengthening, if back pain tolerates.    Golden Pop, PT 04/07/2023, 9:01 AM

## 2023-04-09 ENCOUNTER — Ambulatory Visit: Payer: Medicare Other | Admitting: Physical Therapy

## 2023-04-09 DIAGNOSIS — R2689 Other abnormalities of gait and mobility: Secondary | ICD-10-CM

## 2023-04-09 DIAGNOSIS — R2681 Unsteadiness on feet: Secondary | ICD-10-CM

## 2023-04-09 DIAGNOSIS — M6281 Muscle weakness (generalized): Secondary | ICD-10-CM | POA: Diagnosis not present

## 2023-04-09 DIAGNOSIS — R262 Difficulty in walking, not elsewhere classified: Secondary | ICD-10-CM

## 2023-04-09 DIAGNOSIS — G8929 Other chronic pain: Secondary | ICD-10-CM

## 2023-04-09 DIAGNOSIS — R278 Other lack of coordination: Secondary | ICD-10-CM

## 2023-04-09 DIAGNOSIS — G20A1 Parkinson's disease without dyskinesia, without mention of fluctuations: Secondary | ICD-10-CM

## 2023-04-09 NOTE — Therapy (Signed)
 OUTPATIENT PHYSICAL THERAPY THORACOLUMBAR Treatment   Patient Name: Katelyn Crawford MRN: [id number] DOB:05-Nov-1940, 83 y.o., female Today's Date: 04/09/2023  END OF SESSION:  PT End of Session - 04/09/23 1018     Visit Number 12    Number of Visits 24    Date for PT Re-Evaluation 05/12/23    Progress Note Due on Visit 10    PT Start Time 1018    PT Stop Time 1100    PT Time Calculation (min) 42 min    Equipment Utilized During Treatment Gait belt    Activity Tolerance Patient tolerated treatment well    Behavior During Therapy WFL for tasks assessed/performed                Past Medical History:  Diagnosis Date   Anemia    Aortic atherosclerosis (HCC)    Arthritis    Cardiomegaly    Cardiomyopathy (HCC)    Cholelithiasis    Chronic kidney disease, stage 2 (mild)    Chronic sinus bradycardia    Coronary artery disease 03/10/2012   a.) LHC 03/10/2012: 40% mLAD, 50% pRI, 50% pRCA, 90% mRCA, 50% dRCA, 50% RPDA - unable to cannulate RCA - med mgmt; b.) LHC/PCI 06/25/2013: 30% pLAD, 40% mLAD, 20% mLCx, 30% RI, 30% pRCA, 70% mRCA (2.5 mm Promus DES), 40% dRCA-1, 90% dRCA-2 (2.5 mm Promus DES)   DDD (degenerative disc disease), thoracolumbar    Diverticulosis    Dizziness    Environmental allergies    Full dentures    GERD (gastroesophageal reflux disease) 06/25/2013   H/O bilateral cataract extraction 2019   History of diverticulosis    History of kidney stones    HLD (hyperlipidemia)    Hyperlipidemia, unspecified    Hypertension    Nephrolithiasis    Osteoporosis, post-menopausal    Parkinson disease (HCC)    Pre-diabetes    PVC's (premature ventricular contractions)    Scoliosis    Thoracic kyphosis    Trifascicular bundle branch block    a.) 1st degree AVB + RBBB + LAFB   Vascular disease    Past Surgical History:  Procedure Laterality Date   APPENDECTOMY     BREAST EXCISIONAL BIOPSY Left 1984   neg   BREAST EXCISIONAL BIOPSY Right 1997   lipoma    CATARACT EXTRACTION W/PHACO Left 09/02/2017   Procedure: CATARACT EXTRACTION PHACO AND INTRAOCULAR LENS PLACEMENT (IOC)  LEFT;  Surgeon: Lockie Mola, MD;  Location: [facility];  Service: Ophthalmology;  Laterality: Left;   CATARACT EXTRACTION W/PHACO Right 10/01/2017   Procedure: CATARACT EXTRACTION PHACO AND INTRAOCULAR LENS PLACEMENT (IOC) RIGHT;  Surgeon: Lockie Mola, MD;  Location: [facility];  Service: Ophthalmology;  Laterality: Right;   COLONOSCOPY WITH PROPOFOL N/A 11/27/2016   Procedure: COLONOSCOPY WITH PROPOFOL;  Surgeon: Toledo, Boykin Nearing, MD;  Location: [facility];  Service: Gastroenterology;  Laterality: N/A;   CORONARY ANGIOPLASTY WITH STENT PLACEMENT Left 06/25/2013   Procedure: CORONARY ANGIOPLASTY WITH STENT PLACEMENT; Location: [facility]; Surgeon: Rolly Salter, MD   KNEE ARTHROPLASTY Left 01/22/2017   Procedure: COMPUTER ASSISTED TOTAL KNEE ARTHROPLASTY;  Surgeon: Donato Heinz, MD;  Location: [facility];  Service: Orthopedics;  Laterality: Left;   KNEE ARTHROSCOPY Left 02/01/2015   Procedure: ARTHROSCOPY KNEE, partial medial & lateral menisectomy,,medial and patelofemoral chondroplasty;  Surgeon: Donato Heinz, MD;  Location: [facility];  Service: Orthopedics;  Laterality: Left;   LEFT HEART CATH AND CORONARY ANGIOGRAPHY Left 03/10/2012   Procedure: LEFT HEART CATH AND  CORONARY ANGIOGRAPHY; Location: [facility]; Surgeon: Marcina Millard, MD   LITHOTRIPSY Right 1995   RIGHT AXILLARY LIPOMA REMOVAL     TUBAL LIGATION     VARICOSE VEIN SURGERY     Patient Active Problem List   Diagnosis Date Noted   Primary osteoarthritis of hip 02/09/2017   Primary osteoarthritis of knee 02/09/2017   Chronic sinus bradycardia 01/22/2017   Degenerative disc disease, lumbar 01/22/2017   Dizziness 01/22/2017   Nephrolithiasis 01/22/2017   Osteoporosis, 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/22/2017   PVC's (premature ventricular contractions) 01/22/2017   S/P total knee  arthroplasty 01/22/2017   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   CKD (chronic kidney disease), 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   GERD (gastroesophageal reflux disease) 06/25/2013   Coronary artery disease involving native coronary artery of native heart 06/24/2013   Essential hypertension 06/21/2013   Mixed hyperlipidemia 06/21/2013    PCP: Marguarite Arbour, MD   REFERRING PROVIDER: Meeler, Jodelle Gross, FNP  REFERRING DIAG:  Diagnosis  [phone] (ICD-10-CM) - Lumbar stenosis with neurogenic claudication  M54.14 (ICD-10-CM) - Thoracic radiculitis    Rationale for Evaluation and Treatment: Rehabilitation  THERAPY DIAG:   Muscle weakness (generalized)  Other lack of coordination  Parkinson's disease, unspecified whether dyskinesia present, unspecified whether manifestations fluctuate (HCC)  Other abnormalities of gait and mobility  Difficulty in walking, not elsewhere classified  Chronic bilateral thoracic back pain  Unsteadiness on feet  ONSET DATE: >3 months   SUBJECTIVE:                                                                                                                                                                                           SUBJECTIVE STATEMENT:  Pt reports that when she woke up, he back was burning. After moving for a little while, the back felt better. When getting ready, she reports increased fatigue. No pain on this day.   PERTINENT HISTORY:  Hx of PD and familiar to this clinic for PD management.  From recent MD appointment:  "The patient is a pleasant 83 year old female who presents today for acute on chronic bilateral low back pain without radiation to the lower extremities. She describes pain that began June 2020 without injury. She also states that she has had some form of back pain for many years. She describes her pain as mild to moderate that is sharp and intermittent. Bending and lifting increases her pain. Rest can help alleviate  her pain. She participated in physical therapy at [facility] in early 2020 with little relief. She continues to stay active in her home and notices that household activities increase her pain such as vacuuming and  mopping. Medications include Tylenol (mild to moderate relief).  She was last evaluated on 10/09/2022 at which time she was doing well. She was to continue with heat and home cycling along with physical therapy exercises. She was to continue with tramadol nightly as needed.  At today's visit she reports that she is experience of increasing pain in the morning in her mid back about the bra line. She would like to return to physical therapy and she states that this was very helpful in managing her pain. We discussed if her pain was to continue consideration would be made for another epidural. She states that she is taking tramadol at night and doing well with this. Denies any injury. Upon palpation today she is without tenderness to the thoracic paraspinal musculature. "  PAIN:  Are you having pain? Yes: NPRS scale: 0/10 Pain location: middle back Pain description: ache Aggravating factors: sleeping Relieving factors: tramadol   PRECAUTIONS: Fall   RED FLAGS: None   WEIGHT BEARING RESTRICTIONS: No  FALLS:  Has patient fallen in last 6 months? No  LIVING ENVIRONMENT: Lives with: lives alone and daughter lives next door. Lives in: House/apartment Stairs: Yes: External: 4 steps; on right going up and on left going up Has following equipment at home:  None, but has use cane in the past. Has shower rails, shower chair; doesn't use  OCCUPATION: retired   PLOF: Independent, Independent with basic ADLs, and Independent with gait  PATIENT GOALS: stronger. Improve mobility   NEXT MD VISIT: unsure   OBJECTIVE:  Note: Objective measures were completed at Evaluation unless otherwise noted.  DIAGNOSTIC FINDINGS:  No recent imaging   PATIENT SURVEYS:  Modified Oswestry 28%    COGNITION: Overall cognitive status: Within functional limits for tasks assessed     SENSATION: WFL  MUSCLE LENGTH: WFL  POSTURE: rounded shoulders, forward head, and decreased lumbar lordosis  PALPATION: Noted tightness in upper thoracic paraspinal. No pain to palpation   LUMBAR ROM:   AROM eval  Flexion WFL  Extension 20  Right lateral flexion   Left lateral flexion   Right rotation minimal  Left rotation ~10 deg   Cervical  AROM eval  Flexion 45  Extension 18  Right lateral flexion 20  Left lateral flexion 12  Right rotation 50  Left rotation  40  Thoracic  AROM eval  Flexion WFL  Extension 10  Right lateral flexion 25  Left lateral flexion 20  Right rotation minimal  Left rotation ~10 deg      (Blank rows = not tested)  LOWER EXTREMITY ROM:     Grossly WFL  LOWER EXTREMITY MMT:    MMT Right eval Left eval  Hip flexion 4- 4-  Hip extension    Hip abduction 4- 4-  Hip adduction 4 4  Hip internal rotation    Hip external rotation    Knee flexion 4 4  Knee extension 4+ 4+  Ankle dorsiflexion 4 4  Ankle plantarflexion    Ankle inversion    Ankle eversion     (Blank rows = not tested)    FUNCTIONAL TESTS:  5 times sit to stand: 16  Timed up and go (TUG): 12.3 sce 6 minute walk test: 1136ft 10 meter walk test: 0.76m/s  GAIT: Distance walked: 70 Assistive device utilized: None Level of assistance: Complete Independence Comments: flexed posture.   TREATMENT DATE: 02/17/2023   TAE octane BUE/BLE reciprocal movement training x 6 min, level 1-4  SPM in 60-70s. No  rest break required on this day.   Standing mid rowRTB  3 x 12  Standing shoulder extension, scapula retraction YTB, 3 x 12   Lateral lunge/ER over cane LE only 2 x 5bil . then with shoulder raise/abduction 2 x 5 bil  Seated Shoulder scaption with cues for Scapular rhythm 2 x 12  Shoulder horizontal abduction open/close with trunk rotation x 19 bil  Seated pec stretch 2  x 30 sec hold with assist from PT.   Manual  STM to Bil UT/ middle traps x 6 min with TP release and pectoralis minor x 2 min bil.  Pt reports decreased pain in upper back upon completion.   PATIENT EDUCATION:  Education details:exercise technique, large-amplitude training/purpose, techniques for donning jacket, finger flicks Person educated: Patient Education method: IT trainer, VC Education comprehension: verbalized understanding, returned demo  HOME EXERCISE PROGRAM: Access Code: [email or web address] URL: [email or web address] Date: 02/20/2023 Prepared by: Grier Rocher  Exercises - Sit to Stand with Arm Swing  - 1 x daily - 7 x weekly - 2 sets - 10 reps - 2 hold - Seated Reach Forward, Up, and To Sides  - 1 x daily - 7 x weekly - 3 sets - 10 reps - Seated Reaching to Side and Across Body  - 1 x daily - 7 x weekly - 3 sets - 10 reps - Standing Row with Anchored Resistance  - 1 x daily - 7 x weekly - 3 sets - 10 reps  ASSESSMENT:  CLINICAL IMPRESSION: Pt put forth excellent effort towards therapeutic interventions. PT treatment focused on improved posture, scapular mobility, and pain management and decreased muscular extensibility in chest and upper thoracic region. Noted to have mildly improve scapular activation with increased repetitions. Pt reports decreased tightness in Upper tspine at end of PT treatment.  Pt will benefit from PT to address back pain improve strength, and overall fucntional mobility   OBJECTIVE IMPAIRMENTS: Abnormal gait, cardiopulmonary status limiting activity, decreased activity tolerance, decreased balance, decreased endurance, decreased mobility, difficulty walking, decreased ROM, decreased strength, hypomobility, increased fascial restrictions, impaired perceived functional ability, impaired flexibility, impaired UE functional use, improper body mechanics, and postural dysfunction.   ACTIVITY LIMITATIONS: carrying, squatting, sleeping, and  locomotion level  PARTICIPATION LIMITATIONS: cleaning, laundry, shopping, and community activity  PERSONAL FACTORS: Age, Fitness, and 1-2 comorbidities: lumbar stenosis   are also affecting patient's functional outcome.   REHAB POTENTIAL: Good  CLINICAL DECISION MAKING: Stable/uncomplicated  EVALUATION COMPLEXITY: Low   GOALS: Goals reviewed with patient? Yes   SHORT TERM GOALS: Target date: 03/18/2023    Patient will be independent in home exercise program to improve strength/mobility for better functional independence with ADLs. Baseline: to be given at session 2.  Goal status: INITIAL   LONG TERM GOALS: Target date: 05/12/2023    Patient will improve Mod ODI score by 12.8 points  to demonstrate statistically significant improvement in mobility and quality of life.  Baseline: 28% 3/12: 26%  Goal status: INITIAL  2.  Patient (> 42 years old) will complete five times sit to stand test in < 15 seconds indicating an increased LE strength and improved balance. Baseline: 16 sec  3/12: 11.81. sec  Goal status: MET  3.  Patient will increase 6 min walk test score by >150 to demonstrated improved access to community and improve safety.   Baseline: 1181ft 3/12: 1234ft Goal status: INITIAL  4.  Patient will increase 10 meter walk test to >1.39m/s as to improve gait speed for better  community ambulation and to reduce fall risk. Baseline: 0.89m/s  3/12: 1.49m/s Goal status: MET  5.  Patient will reduce timed up and go to <11 seconds to reduce fall risk and demonstrate improved transfer/gait ability. Baseline: 13sec  3/12; 12.5sec  Goal status: IN PROGRESS  6.  Patient will reduced back pain to 3/10 at worst to indicate improved function with hold tasks.  Baseline: 9/10 at night.  3/12: 8/10, when waking up in the morning Goal status: IN PROGRESS  PLAN:  PT FREQUENCY: 1-2x/week  PT DURATION: 12 weeks  PLANNED INTERVENTIONS: 97110-Therapeutic exercises, 97530-  Therapeutic activity, 97112- Neuromuscular re-education, 97535- Self Care, 53664- Manual therapy, 97116- Gait training, 97014- Electrical stimulation (unattended), [phone]- Electrical stimulation (manual), Balance training, Stair training, Taping, Dry Needling, Joint mobilization, Joint manipulation, Spinal manipulation, Spinal mobilization, Scar mobilization, DME instructions, Cryotherapy, and Moist heat.  PLAN FOR NEXT SESSION:   Continue to address back pain and trunk mobility deficits PD treatment. Continue with balance and LE strengthening, if back pain tolerates.    Golden Pop, PT 04/09/2023, 10:22 AM

## 2023-04-14 ENCOUNTER — Ambulatory Visit: Payer: Medicare Other | Admitting: Physical Therapy

## 2023-04-14 DIAGNOSIS — G20A1 Parkinson's disease without dyskinesia, without mention of fluctuations: Secondary | ICD-10-CM

## 2023-04-14 DIAGNOSIS — M6281 Muscle weakness (generalized): Secondary | ICD-10-CM | POA: Diagnosis not present

## 2023-04-14 DIAGNOSIS — R262 Difficulty in walking, not elsewhere classified: Secondary | ICD-10-CM

## 2023-04-14 DIAGNOSIS — R278 Other lack of coordination: Secondary | ICD-10-CM

## 2023-04-14 DIAGNOSIS — R2689 Other abnormalities of gait and mobility: Secondary | ICD-10-CM

## 2023-04-14 DIAGNOSIS — G8929 Other chronic pain: Secondary | ICD-10-CM

## 2023-04-14 NOTE — Therapy (Signed)
 OUTPATIENT PHYSICAL THERAPY THORACOLUMBAR Treatment   Patient Name: Katelyn Crawford MRN: [id number] DOB:1940-12-10, 83 y.o., female Today's Date: 04/14/2023  END OF SESSION:  PT End of Session - 04/14/23 0925     Visit Number 13    Number of Visits 24    Date for PT Re-Evaluation 05/12/23    Progress Note Due on Visit 10    PT Start Time 0848    PT Stop Time 0927    PT Time Calculation (min) 39 min    Equipment Utilized During Treatment Gait belt    Activity Tolerance Patient tolerated treatment well    Behavior During Therapy WFL for tasks assessed/performed                Past Medical History:  Diagnosis Date   Anemia    Aortic atherosclerosis (HCC)    Arthritis    Cardiomegaly    Cardiomyopathy (HCC)    Cholelithiasis    Chronic kidney disease, stage 2 (mild)    Chronic sinus bradycardia    Coronary artery disease 03/10/2012   a.) LHC 03/10/2012: 40% mLAD, 50% pRI, 50% pRCA, 90% mRCA, 50% dRCA, 50% RPDA - unable to cannulate RCA - med mgmt; b.) LHC/PCI 06/25/2013: 30% pLAD, 40% mLAD, 20% mLCx, 30% RI, 30% pRCA, 70% mRCA (2.5 mm Promus DES), 40% dRCA-1, 90% dRCA-2 (2.5 mm Promus DES)   DDD (degenerative disc disease), thoracolumbar    Diverticulosis    Dizziness    Environmental allergies    Full dentures    GERD (gastroesophageal reflux disease) 06/25/2013   H/O bilateral cataract extraction 2019   History of diverticulosis    History of kidney stones    HLD (hyperlipidemia)    Hyperlipidemia, unspecified    Hypertension    Nephrolithiasis    Osteoporosis, post-menopausal    Parkinson disease (HCC)    Pre-diabetes    PVC's (premature ventricular contractions)    Scoliosis    Thoracic kyphosis    Trifascicular bundle branch block    a.) 1st degree AVB + RBBB + LAFB   Vascular disease    Past Surgical History:  Procedure Laterality Date   APPENDECTOMY     BREAST EXCISIONAL BIOPSY Left 1984   neg   BREAST EXCISIONAL BIOPSY Right 1997   lipoma    CATARACT EXTRACTION W/PHACO Left 09/02/2017   Procedure: CATARACT EXTRACTION PHACO AND INTRAOCULAR LENS PLACEMENT (IOC)  LEFT;  Surgeon: Lockie Mola, MD;  Location: [facility];  Service: Ophthalmology;  Laterality: Left;   CATARACT EXTRACTION W/PHACO Right 10/01/2017   Procedure: CATARACT EXTRACTION PHACO AND INTRAOCULAR LENS PLACEMENT (IOC) RIGHT;  Surgeon: Lockie Mola, MD;  Location: [facility];  Service: Ophthalmology;  Laterality: Right;   COLONOSCOPY WITH PROPOFOL N/A 11/27/2016   Procedure: COLONOSCOPY WITH PROPOFOL;  Surgeon: Toledo, Boykin Nearing, MD;  Location: [facility];  Service: Gastroenterology;  Laterality: N/A;   CORONARY ANGIOPLASTY WITH STENT PLACEMENT Left 06/25/2013   Procedure: CORONARY ANGIOPLASTY WITH STENT PLACEMENT; Location: [facility]; Surgeon: Rolly Salter, MD   KNEE ARTHROPLASTY Left 01/22/2017   Procedure: COMPUTER ASSISTED TOTAL KNEE ARTHROPLASTY;  Surgeon: Donato Heinz, MD;  Location: [facility];  Service: Orthopedics;  Laterality: Left;   KNEE ARTHROSCOPY Left 02/01/2015   Procedure: ARTHROSCOPY KNEE, partial medial & lateral menisectomy,,medial and patelofemoral chondroplasty;  Surgeon: Donato Heinz, MD;  Location: [facility];  Service: Orthopedics;  Laterality: Left;   LEFT HEART CATH AND CORONARY ANGIOGRAPHY Left 03/10/2012   Procedure: LEFT HEART CATH AND  CORONARY ANGIOGRAPHY; Location: [facility]; Surgeon: Marcina Millard, MD   LITHOTRIPSY Right 1995   RIGHT AXILLARY LIPOMA REMOVAL     TUBAL LIGATION     VARICOSE VEIN SURGERY     Patient Active Problem List   Diagnosis Date Noted   Primary osteoarthritis of hip 02/09/2017   Primary osteoarthritis of knee 02/09/2017   Chronic sinus bradycardia 01/22/2017   Degenerative disc disease, lumbar 01/22/2017   Dizziness 01/22/2017   Nephrolithiasis 01/22/2017   Osteoporosis, 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/22/2017   PVC's (premature ventricular contractions) 01/22/2017   S/P total knee  arthroplasty 01/22/2017   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   CKD (chronic kidney disease), 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   GERD (gastroesophageal reflux disease) 06/25/2013   Coronary artery disease involving native coronary artery of native heart 06/24/2013   Essential hypertension 06/21/2013   Mixed hyperlipidemia 06/21/2013    PCP: Marguarite Arbour, MD   REFERRING PROVIDER: Meeler, Jodelle Gross, FNP  REFERRING DIAG:  Diagnosis  [phone] (ICD-10-CM) - Lumbar stenosis with neurogenic claudication  M54.14 (ICD-10-CM) - Thoracic radiculitis    Rationale for Evaluation and Treatment: Rehabilitation  THERAPY DIAG:   Muscle weakness (generalized)  Other lack of coordination  Parkinson's disease, unspecified whether dyskinesia present, unspecified whether manifestations fluctuate (HCC)  Other abnormalities of gait and mobility  Chronic bilateral thoracic back pain  Difficulty in walking, not elsewhere classified  ONSET DATE: >3 months   SUBJECTIVE:                                                                                                                                                                                           SUBJECTIVE STATEMENT:  Pt reports that she had a very busy day yesterday, but no pain reported today. Improved from last PT session.    PERTINENT HISTORY:  Hx of PD and familiar to this clinic for PD management.  From recent MD appointment:  "The patient is a pleasant 83 year old female who presents today for acute on chronic bilateral low back pain without radiation to the lower extremities. She describes pain that began June 2020 without injury. She also states that she has had some form of back pain for many years. She describes her pain as mild to moderate that is sharp and intermittent. Bending and lifting increases her pain. Rest can help alleviate her pain. She participated in physical therapy at [facility] clinic in early 2020 with little  relief. She continues to stay active in her home and notices that household activities increase her pain such as vacuuming and mopping. Medications include Tylenol (mild to moderate relief).  She was last evaluated on 10/09/2022 at  which time she was doing well. She was to continue with heat and home cycling along with physical therapy exercises. She was to continue with tramadol nightly as needed.  At today's visit she reports that she is experience of increasing pain in the morning in her mid back about the bra line. She would like to return to physical therapy and she states that this was very helpful in managing her pain. We discussed if her pain was to continue consideration would be made for another epidural. She states that she is taking tramadol at night and doing well with this. Denies any injury. Upon palpation today she is without tenderness to the thoracic paraspinal musculature. "  PAIN:  Are you having pain? Yes: NPRS scale: 0/10 Pain location: middle back Pain description: ache Aggravating factors: sleeping Relieving factors: tramadol   PRECAUTIONS: Fall   RED FLAGS: None   WEIGHT BEARING RESTRICTIONS: No  FALLS:  Has patient fallen in last 6 months? No  LIVING ENVIRONMENT: Lives with: lives alone and daughter lives next door. Lives in: House/apartment Stairs: Yes: External: 4 steps; on right going up and on left going up Has following equipment at home:  None, but has use cane in the past. Has shower rails, shower chair; doesn't use  OCCUPATION: retired   PLOF: Independent, Independent with basic ADLs, and Independent with gait  PATIENT GOALS: stronger. Improve mobility   NEXT MD VISIT: unsure   OBJECTIVE:  Note: Objective measures were completed at Evaluation unless otherwise noted.  DIAGNOSTIC FINDINGS:  No recent imaging   PATIENT SURVEYS:  Modified Oswestry 28%   COGNITION: Overall cognitive status: Within functional limits for tasks  assessed     SENSATION: WFL  MUSCLE LENGTH: WFL  POSTURE: rounded shoulders, forward head, and decreased lumbar lordosis  PALPATION: Noted tightness in upper thoracic paraspinal. No pain to palpation   LUMBAR ROM:   AROM eval  Flexion WFL  Extension 20  Right lateral flexion   Left lateral flexion   Right rotation minimal  Left rotation ~10 deg   Cervical  AROM eval  Flexion 45  Extension 18  Right lateral flexion 20  Left lateral flexion 12  Right rotation 50  Left rotation  40  Thoracic  AROM eval  Flexion WFL  Extension 10  Right lateral flexion 25  Left lateral flexion 20  Right rotation minimal  Left rotation ~10 deg      (Blank rows = not tested)  LOWER EXTREMITY ROM:     Grossly WFL  LOWER EXTREMITY MMT:    MMT Right eval Left eval  Hip flexion 4- 4-  Hip extension    Hip abduction 4- 4-  Hip adduction 4 4  Hip internal rotation    Hip external rotation    Knee flexion 4 4  Knee extension 4+ 4+  Ankle dorsiflexion 4 4  Ankle plantarflexion    Ankle inversion    Ankle eversion     (Blank rows = not tested)    FUNCTIONAL TESTS:  5 times sit to stand: 16  Timed up and go (TUG): 12.3 sce 6 minute walk test: 1164ft 10 meter walk test: 0.32m/s  GAIT: Distance walked: 70 Assistive device utilized: None Level of assistance: Complete Independence Comments: flexed posture.   TREATMENT DATE: 02/17/2023  NUstep level 1-4 x 6 min. Min cues for neutral posture to reduce lateral lean to the R as well as improved cervical alignment.  No rest break required.   UT stretch  2 x 30sec bil  Standing mid row YTB  3 x 12  Standing shoulder extension, scapula retraction YTB, 3 x 12  Lateral trunk flexion with shoulder abduction 2 x 8 bil  Seated shoulder swing into extension with 1.5# AW on wrist x 10  Forward step with bil shoulder flexion 2 x 12 bil  Weighted gait with 1.5# AW on wrist. Emphasis on improved shoulder extension in swing x 448ft.   Shoulder rolls x 12 Shoulder horizontal abduction x 8 bil to open/close.   Throughout session, min cues for improved posture and scapular activation and posture with all movements as well as full ROM.   PATIENT EDUCATION:  Education details:exercise technique, large-amplitude training/purpose, techniques for donning jacket, finger flicks Person educated: Patient Education method: IT trainer, VC Education comprehension: verbalized understanding, returned demo  HOME EXERCISE PROGRAM: Access Code: [email or web address] URL: [email or web address] Date: 02/20/2023 Prepared by: Grier Rocher  Exercises - Sit to Stand with Arm Swing  - 1 x daily - 7 x weekly - 2 sets - 10 reps - 2 hold - Seated Reach Forward, Up, and To Sides  - 1 x daily - 7 x weekly - 3 sets - 10 reps - Seated Reaching to Side and Across Body  - 1 x daily - 7 x weekly - 3 sets - 10 reps - Standing Row with Anchored Resistance  - 1 x daily - 7 x weekly - 3 sets - 10 reps  ASSESSMENT:  CLINICAL IMPRESSION: Pt put forth excellent effort towards therapeutic interventions. PT treatment focused on improved posture, scapular mobility, Noted to have mildly improve scapular activation with increased repetitions as well as mildly improved posture with tactile cues.  Pt will benefit from PT to address back pain improve strength, and overall fucntional mobility   OBJECTIVE IMPAIRMENTS: Abnormal gait, cardiopulmonary status limiting activity, decreased activity tolerance, decreased balance, decreased endurance, decreased mobility, difficulty walking, decreased ROM, decreased strength, hypomobility, increased fascial restrictions, impaired perceived functional ability, impaired flexibility, impaired UE functional use, improper body mechanics, and postural dysfunction.   ACTIVITY LIMITATIONS: carrying, squatting, sleeping, and locomotion level  PARTICIPATION LIMITATIONS: cleaning, laundry, shopping, and community  activity  PERSONAL FACTORS: Age, Fitness, and 1-2 comorbidities: lumbar stenosis   are also affecting patient's functional outcome.   REHAB POTENTIAL: Good  CLINICAL DECISION MAKING: Stable/uncomplicated  EVALUATION COMPLEXITY: Low   GOALS: Goals reviewed with patient? Yes   SHORT TERM GOALS: Target date: 03/18/2023    Patient will be independent in home exercise program to improve strength/mobility for better functional independence with ADLs. Baseline: to be given at session 2.  Goal status: INITIAL   LONG TERM GOALS: Target date: 05/12/2023    Patient will improve Mod ODI score by 12.8 points  to demonstrate statistically significant improvement in mobility and quality of life.  Baseline: 28% 3/12: 26%  Goal status: INITIAL  2.  Patient (> 25 years old) will complete five times sit to stand test in < 15 seconds indicating an increased LE strength and improved balance. Baseline: 16 sec  3/12: 11.81. sec  Goal status: MET  3.  Patient will increase 6 min walk test score by >150 to demonstrated improved access to community and improve safety.   Baseline: 1128ft 3/12: 125ft Goal status: INITIAL  4.  Patient will increase 10 meter walk test to >1.34m/s as to improve gait speed for better community ambulation and to reduce fall risk. Baseline: 0.66m/s  3/12: 1.10m/s Goal status: MET  5.  Patient will  reduce timed up and go to <11 seconds to reduce fall risk and demonstrate improved transfer/gait ability. Baseline: 13sec  3/12; 12.5sec  Goal status: IN PROGRESS  6.  Patient will reduced back pain to 3/10 at worst to indicate improved function with hold tasks.  Baseline: 9/10 at night.  3/12: 8/10, when waking up in the morning Goal status: IN PROGRESS  PLAN:  PT FREQUENCY: 1-2x/week  PT DURATION: 12 weeks  PLANNED INTERVENTIONS: 97110-Therapeutic exercises, 97530- Therapeutic activity, 97112- Neuromuscular re-education, 97535- Self Care, 16109- Manual  therapy, 97116- Gait training, 97014- Electrical stimulation (unattended), [phone]- Electrical stimulation (manual), Balance training, Stair training, Taping, Dry Needling, Joint mobilization, Joint manipulation, Spinal manipulation, Spinal mobilization, Scar mobilization, DME instructions, Cryotherapy, and Moist heat.  PLAN FOR NEXT SESSION:   Continue to address back pain and trunk mobility deficits PD treatment. Continue with balance and LE strengthening, if back pain tolerates.    Golden Pop, PT 04/14/2023, 10:10 AM

## 2023-04-16 ENCOUNTER — Ambulatory Visit: Payer: Medicare Other | Admitting: Physical Therapy

## 2023-04-16 DIAGNOSIS — R2681 Unsteadiness on feet: Secondary | ICD-10-CM

## 2023-04-16 DIAGNOSIS — M6281 Muscle weakness (generalized): Secondary | ICD-10-CM | POA: Diagnosis not present

## 2023-04-16 DIAGNOSIS — R2689 Other abnormalities of gait and mobility: Secondary | ICD-10-CM

## 2023-04-16 DIAGNOSIS — R278 Other lack of coordination: Secondary | ICD-10-CM

## 2023-04-16 DIAGNOSIS — R262 Difficulty in walking, not elsewhere classified: Secondary | ICD-10-CM

## 2023-04-16 DIAGNOSIS — G8929 Other chronic pain: Secondary | ICD-10-CM

## 2023-04-16 DIAGNOSIS — G20A1 Parkinson's disease without dyskinesia, without mention of fluctuations: Secondary | ICD-10-CM

## 2023-04-16 NOTE — Therapy (Signed)
 OUTPATIENT PHYSICAL THERAPY THORACOLUMBAR Treatment   Patient Name: Katelyn Crawford MRN: [id number] DOB:1940-08-09, 83 y.o., female Today's Date: 04/16/2023  END OF SESSION:  PT End of Session - 04/16/23 1019     Visit Number 15    Number of Visits 24    Date for PT Re-Evaluation 05/12/23    Progress Note Due on Visit 10    PT Start Time 1016    PT Stop Time 1056    PT Time Calculation (min) 40 min    Equipment Utilized During Treatment Gait belt    Activity Tolerance Patient tolerated treatment well    Behavior During Therapy WFL for tasks assessed/performed                Past Medical History:  Diagnosis Date   Anemia    Aortic atherosclerosis (HCC)    Arthritis    Cardiomegaly    Cardiomyopathy (HCC)    Cholelithiasis    Chronic kidney disease, stage 2 (mild)    Chronic sinus bradycardia    Coronary artery disease 03/10/2012   a.) LHC 03/10/2012: 40% mLAD, 50% pRI, 50% pRCA, 90% mRCA, 50% dRCA, 50% RPDA - unable to cannulate RCA - med mgmt; b.) LHC/PCI 06/25/2013: 30% pLAD, 40% mLAD, 20% mLCx, 30% RI, 30% pRCA, 70% mRCA (2.5 mm Promus DES), 40% dRCA-1, 90% dRCA-2 (2.5 mm Promus DES)   DDD (degenerative disc disease), thoracolumbar    Diverticulosis    Dizziness    Environmental allergies    Full dentures    GERD (gastroesophageal reflux disease) 06/25/2013   H/O bilateral cataract extraction 2019   History of diverticulosis    History of kidney stones    HLD (hyperlipidemia)    Hyperlipidemia, unspecified    Hypertension    Nephrolithiasis    Osteoporosis, post-menopausal    Parkinson disease (HCC)    Pre-diabetes    PVC's (premature ventricular contractions)    Scoliosis    Thoracic kyphosis    Trifascicular bundle branch block    a.) 1st degree AVB + RBBB + LAFB   Vascular disease    Past Surgical History:  Procedure Laterality Date   APPENDECTOMY     BREAST EXCISIONAL BIOPSY Left 1984   neg   BREAST EXCISIONAL BIOPSY Right 1997   lipoma    CATARACT EXTRACTION W/PHACO Left 09/02/2017   Procedure: CATARACT EXTRACTION PHACO AND INTRAOCULAR LENS PLACEMENT (IOC)  LEFT;  Surgeon: Lockie Mola, MD;  Location: [facility];  Service: Ophthalmology;  Laterality: Left;   CATARACT EXTRACTION W/PHACO Right 10/01/2017   Procedure: CATARACT EXTRACTION PHACO AND INTRAOCULAR LENS PLACEMENT (IOC) RIGHT;  Surgeon: Lockie Mola, MD;  Location: [facility];  Service: Ophthalmology;  Laterality: Right;   COLONOSCOPY WITH PROPOFOL N/A 11/27/2016   Procedure: COLONOSCOPY WITH PROPOFOL;  Surgeon: Toledo, Boykin Nearing, MD;  Location: [facility];  Service: Gastroenterology;  Laterality: N/A;   CORONARY ANGIOPLASTY WITH STENT PLACEMENT Left 06/25/2013   Procedure: CORONARY ANGIOPLASTY WITH STENT PLACEMENT; Location: [facility]; Surgeon: Rolly Salter, MD   KNEE ARTHROPLASTY Left 01/22/2017   Procedure: COMPUTER ASSISTED TOTAL KNEE ARTHROPLASTY;  Surgeon: Donato Heinz, MD;  Location: [facility];  Service: Orthopedics;  Laterality: Left;   KNEE ARTHROSCOPY Left 02/01/2015   Procedure: ARTHROSCOPY KNEE, partial medial & lateral menisectomy,,medial and patelofemoral chondroplasty;  Surgeon: Donato Heinz, MD;  Location: [facility];  Service: Orthopedics;  Laterality: Left;   LEFT HEART CATH AND CORONARY ANGIOGRAPHY Left 03/10/2012   Procedure: LEFT HEART CATH AND  CORONARY ANGIOGRAPHY; Location: [facility]; Surgeon: Marcina Millard, MD   LITHOTRIPSY Right 1995   RIGHT AXILLARY LIPOMA REMOVAL     TUBAL LIGATION     VARICOSE VEIN SURGERY     Patient Active Problem List   Diagnosis Date Noted   Primary osteoarthritis of hip 02/09/2017   Primary osteoarthritis of knee 02/09/2017   Chronic sinus bradycardia 01/22/2017   Degenerative disc disease, lumbar 01/22/2017   Dizziness 01/22/2017   Nephrolithiasis 01/22/2017   Osteoporosis, 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/22/2017   PVC's (premature ventricular contractions) 01/22/2017   S/P total knee  arthroplasty 01/22/2017   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   CKD (chronic kidney disease), 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   GERD (gastroesophageal reflux disease) 06/25/2013   Coronary artery disease involving native coronary artery of native heart 06/24/2013   Essential hypertension 06/21/2013   Mixed hyperlipidemia 06/21/2013    PCP: Marguarite Arbour, MD   REFERRING PROVIDER: Meeler, Jodelle Gross, FNP  REFERRING DIAG:  Diagnosis  [phone] (ICD-10-CM) - Lumbar stenosis with neurogenic claudication  M54.14 (ICD-10-CM) - Thoracic radiculitis    Rationale for Evaluation and Treatment: Rehabilitation  THERAPY DIAG:   Muscle weakness (generalized)  Other lack of coordination  Parkinson's disease, unspecified whether dyskinesia present, unspecified whether manifestations fluctuate (HCC)  Other abnormalities of gait and mobility  Chronic bilateral thoracic back pain  Difficulty in walking, not elsewhere classified  Unsteadiness on feet  ONSET DATE: >3 months   SUBJECTIVE:                                                                                                                                                                                           SUBJECTIVE STATEMENT:   Pt reports that she had a good day yesterday, allowing her to do a little more; 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 States that she woke up this morning in significant burning in upper back, but it has "calmed down a little" to 5/10 at start of PT treatment.   PERTINENT HISTORY:  Hx of PD and familiar to this clinic for PD management.  From recent MD appointment:  "The patient is a pleasant 83 year old female who presents today for acute on chronic bilateral low back pain without radiation to the lower extremities. She describes pain that began June 2020 without injury. She also states that she has had some form of back pain for many years. She describes her pain as mild to moderate that  is sharp and intermittent. Bending and lifting increases her pain. Rest can help alleviate her pain. She participated in physical therapy at [facility] clinic in early 2020 with little relief. She continues  to stay active in her home and notices that household activities increase her pain such as vacuuming and mopping. Medications include Tylenol (mild to moderate relief).  She was last evaluated on 10/09/2022 at which time she was doing well. She was to continue with heat and home cycling along with physical therapy exercises. She was to continue with tramadol nightly as needed.  At today's visit she reports that she is experience of increasing pain in the morning in her mid back about the bra line. She would like to return to physical therapy and she states that this was very helpful in managing her pain. We discussed if her pain was to continue consideration would be made for another epidural. She states that she is taking tramadol at night and doing well with this. Denies any injury. Upon palpation today she is without tenderness to the thoracic paraspinal musculature. "  PAIN:  Are you having pain? Yes: NPRS scale: 0/10 Pain location: middle back Pain description: ache Aggravating factors: sleeping Relieving factors: tramadol   PRECAUTIONS: Fall   RED FLAGS: None   WEIGHT BEARING RESTRICTIONS: No  FALLS:  Has patient fallen in last 6 months? No  LIVING ENVIRONMENT: Lives with: lives alone and daughter lives next door. Lives in: House/apartment Stairs: Yes: External: 4 steps; on right going up and on left going up Has following equipment at home:  None, but has use cane in the past. Has shower rails, shower chair; doesn't use  OCCUPATION: retired   PLOF: Independent, Independent with basic ADLs, and Independent with gait  PATIENT GOALS: stronger. Improve mobility   NEXT MD VISIT: unsure   OBJECTIVE:  Note: Objective measures were completed at Evaluation unless otherwise  noted.  DIAGNOSTIC FINDINGS:  No recent imaging   PATIENT SURVEYS:  Modified Oswestry 28%   COGNITION: Overall cognitive status: Within functional limits for tasks assessed     SENSATION: WFL  MUSCLE LENGTH: WFL  POSTURE: rounded shoulders, forward head, and decreased lumbar lordosis  PALPATION: Noted tightness in upper thoracic paraspinal. No pain to palpation   LUMBAR ROM:   AROM eval  Flexion WFL  Extension 20  Right lateral flexion   Left lateral flexion   Right rotation minimal  Left rotation ~10 deg   Cervical  AROM eval  Flexion 45  Extension 18  Right lateral flexion 20  Left lateral flexion 12  Right rotation 50  Left rotation  40  Thoracic  AROM eval  Flexion WFL  Extension 10  Right lateral flexion 25  Left lateral flexion 20  Right rotation minimal  Left rotation ~10 deg      (Blank rows = not tested)  LOWER EXTREMITY ROM:     Grossly WFL  LOWER EXTREMITY MMT:    MMT Right eval Left eval  Hip flexion 4- 4-  Hip extension    Hip abduction 4- 4-  Hip adduction 4 4  Hip internal rotation    Hip external rotation    Knee flexion 4 4  Knee extension 4+ 4+  Ankle dorsiflexion 4 4  Ankle plantarflexion    Ankle inversion    Ankle eversion     (Blank rows = not tested)    FUNCTIONAL TESTS:  5 times sit to stand: 16  Timed up and go (TUG): 12.3 sce 6 minute walk test: 1121ft 10 meter walk test: 0.32m/s  GAIT: Distance walked: 70 Assistive device utilized: None Level of assistance: Complete Independence Comments: flexed posture.   TREATMENT DATE: 02/17/2023  Octane  level 1-3 x 5 min. Min cues for neutral posture to reduce lateral lean to the R.  No rest break required.   UT stretch 2 x 20sec bil  Sitting mid  row YTB x 15 Sit<>supine with increased time, but no assist from PT.  Open book x 10 bil with cues for proper shoulder/scapula movement.  Supine over head pec/lat stretch  5 x 10 sec hold in pain free range.    Manual:  STM to R distal Lats and serratus insertion. X 5 min  PT relaease to R middle trap x 3 min and L UT PT release x 3 min  Scapula manual retraction/protraction and elevation/depression x 3 min bil.  Min cues for decreased UT activation as tolerated by pt.   Throughout session, min cues for improved posture and scapular activation and posture with all movements as well as full ROM.   Pt states decreased UT and upper thoracic tightness upon completion but mild increased tightness in the low back 2/10.   PATIENT EDUCATION:  Education details:exercise technique, large-amplitude training/purpose, techniques for donning jacket, finger flicks Person educated: Patient Education method: IT trainer, VC Education comprehension: verbalized understanding, returned demo  HOME EXERCISE PROGRAM: Access Code: [email or web address] URL: [email or web address] Date: 02/20/2023 Prepared by: Grier Rocher  Exercises - Sit to Stand with Arm Swing  - 1 x daily - 7 x weekly - 2 sets - 10 reps - 2 hold - Seated Reach Forward, Up, and To Sides  - 1 x daily - 7 x weekly - 3 sets - 10 reps - Seated Reaching to Side and Across Body  - 1 x daily - 7 x weekly - 3 sets - 10 reps - Standing Row with Anchored Resistance  - 1 x daily - 7 x weekly - 3 sets - 10 reps  ASSESSMENT:  CLINICAL IMPRESSION: Pt put forth excellent effort towards therapeutic interventions. PT treatment focused on improved scapular mobility and improved muscle extensibility in upper thoracic region. Pt reports decreased pain in upper back and neck upon completion, but mild increased tightness in lower back from rolling in bed.  Pt will benefit from PT to address back pain improve strength, and overall fucntional mobility   OBJECTIVE IMPAIRMENTS: Abnormal gait, cardiopulmonary status limiting activity, decreased activity tolerance, decreased balance, decreased endurance, decreased mobility, difficulty walking, decreased ROM,  decreased strength, hypomobility, increased fascial restrictions, impaired perceived functional ability, impaired flexibility, impaired UE functional use, improper body mechanics, and postural dysfunction.   ACTIVITY LIMITATIONS: carrying, squatting, sleeping, and locomotion level  PARTICIPATION LIMITATIONS: cleaning, laundry, shopping, and community activity  PERSONAL FACTORS: Age, Fitness, and 1-2 comorbidities: lumbar stenosis   are also affecting patient's functional outcome.   REHAB POTENTIAL: Good  CLINICAL DECISION MAKING: Stable/uncomplicated  EVALUATION COMPLEXITY: Low   GOALS: Goals reviewed with patient? Yes   SHORT TERM GOALS: Target date: 03/18/2023    Patient will be independent in home exercise program to improve strength/mobility for better functional independence with ADLs. Baseline: to be given at session 2.  Goal status: INITIAL   LONG TERM GOALS: Target date: 05/12/2023    Patient will improve Mod ODI score by 12.8 points  to demonstrate statistically significant improvement in mobility and quality of life.  Baseline: 28% 3/12: 26%  Goal status: INITIAL  2.  Patient (> 86 years old) will complete five times sit to stand test in < 15 seconds indicating an increased LE strength and improved balance. Baseline: 16 sec  3/12: 11.81. sec  Goal status: MET  3.  Patient will increase 6 min walk test score by >150 to demonstrated improved access to community and improve safety.   Baseline: 1168ft 3/12: 1216ft Goal status: INITIAL  4.  Patient will increase 10 meter walk test to >1.19m/s as to improve gait speed for better community ambulation and to reduce fall risk. Baseline: 0.44m/s  3/12: 1.65m/s Goal status: MET  5.  Patient will reduce timed up and go to <11 seconds to reduce fall risk and demonstrate improved transfer/gait ability. Baseline: 13sec  3/12; 12.5sec  Goal status: IN PROGRESS  6.  Patient will reduced back pain to 3/10 at worst to  indicate improved function with hold tasks.  Baseline: 9/10 at night.  3/12: 8/10, when waking up in the morning Goal status: IN PROGRESS  PLAN:  PT FREQUENCY: 1-2x/week  PT DURATION: 12 weeks  PLANNED INTERVENTIONS: 97110-Therapeutic exercises, 97530- Therapeutic activity, 97112- Neuromuscular re-education, 97535- Self Care, 19147- Manual therapy, 97116- Gait training, 97014- Electrical stimulation (unattended), [phone]- Electrical stimulation (manual), Balance training, Stair training, Taping, Dry Needling, Joint mobilization, Joint manipulation, Spinal manipulation, Spinal mobilization, Scar mobilization, DME instructions, Cryotherapy, and Moist heat.  PLAN FOR NEXT SESSION:   Continue to address back pain and trunk mobility deficits PD treatment. Continue with balance and LE strengthening, if back pain tolerates.    Golden Pop, PT 04/16/2023, 10:19 AM

## 2023-04-21 ENCOUNTER — Ambulatory Visit: Payer: Medicare Other | Admitting: Physical Therapy

## 2023-04-21 DIAGNOSIS — M6281 Muscle weakness (generalized): Secondary | ICD-10-CM

## 2023-04-21 DIAGNOSIS — R2681 Unsteadiness on feet: Secondary | ICD-10-CM

## 2023-04-21 DIAGNOSIS — G8929 Other chronic pain: Secondary | ICD-10-CM

## 2023-04-21 DIAGNOSIS — R262 Difficulty in walking, not elsewhere classified: Secondary | ICD-10-CM

## 2023-04-21 DIAGNOSIS — R278 Other lack of coordination: Secondary | ICD-10-CM

## 2023-04-21 DIAGNOSIS — G20A1 Parkinson's disease without dyskinesia, without mention of fluctuations: Secondary | ICD-10-CM

## 2023-04-21 DIAGNOSIS — R2689 Other abnormalities of gait and mobility: Secondary | ICD-10-CM

## 2023-04-21 NOTE — Therapy (Signed)
 OUTPATIENT PHYSICAL THERAPY THORACOLUMBAR Treatment   Patient Name: Katelyn Crawford MRN: [id number] DOB:July 10, 1940, 83 y.o., female Today's Date: 04/21/2023  END OF SESSION:  PT End of Session - 04/21/23 1036     Visit Number 16    Number of Visits 24    Date for PT Re-Evaluation 05/12/23    Progress Note Due on Visit 10    PT Start Time 1019    PT Stop Time 1100    PT Time Calculation (min) 41 min    Equipment Utilized During Treatment Gait belt    Activity Tolerance Patient tolerated treatment well    Behavior During Therapy WFL for tasks assessed/performed                Past Medical History:  Diagnosis Date   Anemia    Aortic atherosclerosis (HCC)    Arthritis    Cardiomegaly    Cardiomyopathy (HCC)    Cholelithiasis    Chronic kidney disease, stage 2 (mild)    Chronic sinus bradycardia    Coronary artery disease 03/10/2012   a.) LHC 03/10/2012: 40% mLAD, 50% pRI, 50% pRCA, 90% mRCA, 50% dRCA, 50% RPDA - unable to cannulate RCA - med mgmt; b.) LHC/PCI 06/25/2013: 30% pLAD, 40% mLAD, 20% mLCx, 30% RI, 30% pRCA, 70% mRCA (2.5 mm Promus DES), 40% dRCA-1, 90% dRCA-2 (2.5 mm Promus DES)   DDD (degenerative disc disease), thoracolumbar    Diverticulosis    Dizziness    Environmental allergies    Full dentures    GERD (gastroesophageal reflux disease) 06/25/2013   H/O bilateral cataract extraction 2019   History of diverticulosis    History of kidney stones    HLD (hyperlipidemia)    Hyperlipidemia, unspecified    Hypertension    Nephrolithiasis    Osteoporosis, post-menopausal    Parkinson disease (HCC)    Pre-diabetes    PVC's (premature ventricular contractions)    Scoliosis    Thoracic kyphosis    Trifascicular bundle branch block    a.) 1st degree AVB + RBBB + LAFB   Vascular disease    Past Surgical History:  Procedure Laterality Date   APPENDECTOMY     BREAST EXCISIONAL BIOPSY Left 1984   neg   BREAST EXCISIONAL BIOPSY Right 1997   lipoma    CATARACT EXTRACTION W/PHACO Left 09/02/2017   Procedure: CATARACT EXTRACTION PHACO AND INTRAOCULAR LENS PLACEMENT (IOC)  LEFT;  Surgeon: Lockie Mola, MD;  Location: [facility];  Service: Ophthalmology;  Laterality: Left;   CATARACT EXTRACTION W/PHACO Right 10/01/2017   Procedure: CATARACT EXTRACTION PHACO AND INTRAOCULAR LENS PLACEMENT (IOC) RIGHT;  Surgeon: Lockie Mola, MD;  Location: [facility];  Service: Ophthalmology;  Laterality: Right;   COLONOSCOPY WITH PROPOFOL N/A 11/27/2016   Procedure: COLONOSCOPY WITH PROPOFOL;  Surgeon: Toledo, Boykin Nearing, MD;  Location: [facility];  Service: Gastroenterology;  Laterality: N/A;   CORONARY ANGIOPLASTY WITH STENT PLACEMENT Left 06/25/2013   Procedure: CORONARY ANGIOPLASTY WITH STENT PLACEMENT; Location: [facility]; Surgeon: Rolly Salter, MD   KNEE ARTHROPLASTY Left 01/22/2017   Procedure: COMPUTER ASSISTED TOTAL KNEE ARTHROPLASTY;  Surgeon: Donato Heinz, MD;  Location: [facility];  Service: Orthopedics;  Laterality: Left;   KNEE ARTHROSCOPY Left 02/01/2015   Procedure: ARTHROSCOPY KNEE, partial medial & lateral menisectomy,,medial and patelofemoral chondroplasty;  Surgeon: Donato Heinz, MD;  Location: [facility];  Service: Orthopedics;  Laterality: Left;   LEFT HEART CATH AND CORONARY ANGIOGRAPHY Left 03/10/2012   Procedure: LEFT HEART CATH AND  CORONARY ANGIOGRAPHY; Location: [facility]; Surgeon: Marcina Millard, MD   LITHOTRIPSY Right 1995   RIGHT AXILLARY LIPOMA REMOVAL     TUBAL LIGATION     VARICOSE VEIN SURGERY     Patient Active Problem List   Diagnosis Date Noted   Primary osteoarthritis of hip 02/09/2017   Primary osteoarthritis of knee 02/09/2017   Chronic sinus bradycardia 01/22/2017   Degenerative disc disease, lumbar 01/22/2017   Dizziness 01/22/2017   Nephrolithiasis 01/22/2017   Osteoporosis, 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/22/2017   PVC's (premature ventricular contractions) 01/22/2017   S/P total knee  arthroplasty 01/22/2017   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   CKD (chronic kidney disease), 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   GERD (gastroesophageal reflux disease) 06/25/2013   Coronary artery disease involving native coronary artery of native heart 06/24/2013   Essential hypertension 06/21/2013   Mixed hyperlipidemia 06/21/2013    PCP: Marguarite Arbour, MD   REFERRING PROVIDER: Meeler, Jodelle Gross, FNP  REFERRING DIAG:  Diagnosis  [phone] (ICD-10-CM) - Lumbar stenosis with neurogenic claudication  M54.14 (ICD-10-CM) - Thoracic radiculitis    Rationale for Evaluation and Treatment: Rehabilitation  THERAPY DIAG:   Muscle weakness (generalized)  Other lack of coordination  Parkinson's disease, unspecified whether dyskinesia present, unspecified whether manifestations fluctuate (HCC)  Other abnormalities of gait and mobility  Chronic bilateral thoracic back pain  Difficulty in walking, not elsewhere classified  Unsteadiness on feet  ONSET DATE: >3 months   SUBJECTIVE:                                                                                                                                                                                           SUBJECTIVE STATEMENT:   Pt reports that she is doing okay today. States that she was in no pain on Friday or Saturday, but had some weakness in the Abdomin on Sunday and a little back pain this morning.   PERTINENT HISTORY:  Hx of PD and familiar to this clinic for PD management.  From recent MD appointment:  "The patient is a pleasant 83 year old female who presents today for acute on chronic bilateral low back pain without radiation to the lower extremities. She describes pain that began June 2020 without injury. She also states that she has had some form of back pain for many years. She describes her pain as mild to moderate that is sharp and intermittent. Bending and lifting increases her pain. Rest can help alleviate her  pain. She participated in physical therapy at [facility] clinic in early 2020 with little relief. She continues to stay active in her home and notices that household activities increase her pain such  as vacuuming and mopping. Medications include Tylenol (mild to moderate relief).  She was last evaluated on 10/09/2022 at which time she was doing well. She was to continue with heat and home cycling along with physical therapy exercises. She was to continue with tramadol nightly as needed.  At today's visit she reports that she is experience of increasing pain in the morning in her mid back about the bra line. She would like to return to physical therapy and she states that this was very helpful in managing her pain. We discussed if her pain was to continue consideration would be made for another epidural. She states that she is taking tramadol at night and doing well with this. Denies any injury. Upon palpation today she is without tenderness to the thoracic paraspinal musculature. "  PAIN:  Are you having pain? Yes: NPRS scale: 0/10 Pain location: middle back Pain description: ache Aggravating factors: sleeping Relieving factors: tramadol   PRECAUTIONS: Fall   RED FLAGS: None   WEIGHT BEARING RESTRICTIONS: No  FALLS:  Has patient fallen in last 6 months? No  LIVING ENVIRONMENT: Lives with: lives alone and daughter lives next door. Lives in: House/apartment Stairs: Yes: External: 4 steps; on right going up and on left going up Has following equipment at home:  None, but has use cane in the past. Has shower rails, shower chair; doesn't use  OCCUPATION: retired   PLOF: Independent, Independent with basic ADLs, and Independent with gait  PATIENT GOALS: stronger. Improve mobility   NEXT MD VISIT: unsure   OBJECTIVE:  Note: Objective measures were completed at Evaluation unless otherwise noted.  DIAGNOSTIC FINDINGS:  No recent imaging   PATIENT SURVEYS:  Modified Oswestry 28%    COGNITION: Overall cognitive status: Within functional limits for tasks assessed     SENSATION: WFL  MUSCLE LENGTH: WFL  POSTURE: rounded shoulders, forward head, and decreased lumbar lordosis  PALPATION: Noted tightness in upper thoracic paraspinal. No pain to palpation   LUMBAR ROM:   AROM eval  Flexion WFL  Extension 20  Right lateral flexion   Left lateral flexion   Right rotation minimal  Left rotation ~10 deg   Cervical  AROM eval  Flexion 45  Extension 18  Right lateral flexion 20  Left lateral flexion 12  Right rotation 50  Left rotation  40  Thoracic  AROM eval  Flexion WFL  Extension 10  Right lateral flexion 25  Left lateral flexion 20  Right rotation minimal  Left rotation ~10 deg      (Blank rows = not tested)  LOWER EXTREMITY ROM:     Grossly WFL  LOWER EXTREMITY MMT:    MMT Right eval Left eval  Hip flexion 4- 4-  Hip extension    Hip abduction 4- 4-  Hip adduction 4 4  Hip internal rotation    Hip external rotation    Knee flexion 4 4  Knee extension 4+ 4+  Ankle dorsiflexion 4 4  Ankle plantarflexion    Ankle inversion    Ankle eversion     (Blank rows = not tested)    FUNCTIONAL TESTS:  5 times sit to stand: 16  Timed up and go (TUG): 12.3 sce 6 minute walk test: 1138ft 10 meter walk test: 0.51m/s  GAIT: Distance walked: 70 Assistive device utilized: None Level of assistance: Complete Independence Comments: flexed posture.   TREATMENT DATE: 02/17/2023  Nustep level 1-3 x 6 min. Improved posture noted in seat of nustep on  this day. Reports RPE only 3-4/10 upton completion and  No rest break required.   Sit<>stand with UE swing into extension x 10, difficulty with sequencing of movement. Step up ont0 6 inch step with Bil rails and instruction to increase pull with BUE to engage lower lats/lower traps.  Gait without resistance x 432ft, gait with 1.5# on bil UE and 2.5$ on BLE x 434ft.  Standing hip abdution x 10  bil  Standing hip extension x 10 bil  Trunk extension in sitting  Sitting trunk rotation with  horizontal shoulder abduction x 10 bil  Shoulder horizontal abduction x10 bil clap hands at midline  Forward/reverse gait with 2.5# AW 29ft x 4   Throughout session, supervision assist from PT for safety.  No LOB  noted  PATIENT EDUCATION:  Education details:exercise technique, large-amplitude training/purpose, techniques for donning jacket, finger flicks Person educated: Patient Education method: IT trainer, VC Education comprehension: verbalized understanding, returned demo  HOME EXERCISE PROGRAM: Access Code: [email or web address] URL: [email or web address] Date: 02/20/2023 Prepared by: Grier Rocher  Exercises - Sit to Stand with Arm Swing  - 1 x daily - 7 x weekly - 2 sets - 10 reps - 2 hold - Seated Reach Forward, Up, and To Sides  - 1 x daily - 7 x weekly - 3 sets - 10 reps - Seated Reaching to Side and Across Body  - 1 x daily - 7 x weekly - 3 sets - 10 reps - Standing Row with Anchored Resistance  - 1 x daily - 7 x weekly - 3 sets - 10 reps  ASSESSMENT:  CLINICAL IMPRESSION: Pt put forth excellent effort towards therapeutic interventions to address PD and mid/upper back pain. PT treatment focused on improved lower and mid trap activation as well as normalized movement patterns to improve arm swing in gait and increased pelvic/trunkal rotation. Reports no increase in pain throughout.  Pt will benefit from PT to address back pain improve strength, and overall fucntional mobility   OBJECTIVE IMPAIRMENTS: Abnormal gait, cardiopulmonary status limiting activity, decreased activity tolerance, decreased balance, decreased endurance, decreased mobility, difficulty walking, decreased ROM, decreased strength, hypomobility, increased fascial restrictions, impaired perceived functional ability, impaired flexibility, impaired UE functional use, improper body mechanics, and postural  dysfunction.   ACTIVITY LIMITATIONS: carrying, squatting, sleeping, and locomotion level  PARTICIPATION LIMITATIONS: cleaning, laundry, shopping, and community activity  PERSONAL FACTORS: Age, Fitness, and 1-2 comorbidities: lumbar stenosis   are also affecting patient's functional outcome.   REHAB POTENTIAL: Good  CLINICAL DECISION MAKING: Stable/uncomplicated  EVALUATION COMPLEXITY: Low   GOALS: Goals reviewed with patient? Yes   SHORT TERM GOALS: Target date: 03/18/2023    Patient will be independent in home exercise program to improve strength/mobility for better functional independence with ADLs. Baseline: to be given at session 2.  Goal status: INITIAL   LONG TERM GOALS: Target date: 05/12/2023    Patient will improve Mod ODI score by 12.8 points  to demonstrate statistically significant improvement in mobility and quality of life.  Baseline: 28% 3/12: 26%  Goal status: INITIAL  2.  Patient (> 58 years old) will complete five times sit to stand test in < 15 seconds indicating an increased LE strength and improved balance. Baseline: 16 sec  3/12: 11.81. sec  Goal status: MET  3.  Patient will increase 6 min walk test score by >150 to demonstrated improved access to community and improve safety.   Baseline: 1157ft 3/12: 126ft Goal status: INITIAL  4.  Patient will  increase 10 meter walk test to >1.93m/s as to improve gait speed for better community ambulation and to reduce fall risk. Baseline: 0.68m/s  3/12: 1.45m/s Goal status: MET  5.  Patient will reduce timed up and go to <11 seconds to reduce fall risk and demonstrate improved transfer/gait ability. Baseline: 13sec  3/12; 12.5sec  Goal status: IN PROGRESS  6.  Patient will reduced back pain to 3/10 at worst to indicate improved function with hold tasks.  Baseline: 9/10 at night.  3/12: 8/10, when waking up in the morning Goal status: IN PROGRESS  PLAN:  PT FREQUENCY: 1-2x/week  PT DURATION: 12  weeks  PLANNED INTERVENTIONS: 97110-Therapeutic exercises, 97530- Therapeutic activity, 97112- Neuromuscular re-education, 97535- Self Care, 62952- Manual therapy, 97116- Gait training, 97014- Electrical stimulation (unattended), [phone]- Electrical stimulation (manual), Balance training, Stair training, Taping, Dry Needling, Joint mobilization, Joint manipulation, Spinal manipulation, Spinal mobilization, Scar mobilization, DME instructions, Cryotherapy, and Moist heat.  PLAN FOR NEXT SESSION:   Continue to address back pain and trunk mobility deficits PD treatment. Continue with balance and LE strengthening, if back pain tolerates.    Golden Pop, PT 04/21/2023, 10:39 AM

## 2023-04-23 ENCOUNTER — Ambulatory Visit: Payer: Medicare Other | Attending: Family Medicine | Admitting: Physical Therapy

## 2023-04-23 DIAGNOSIS — M6281 Muscle weakness (generalized): Secondary | ICD-10-CM | POA: Insufficient documentation

## 2023-04-23 DIAGNOSIS — G8929 Other chronic pain: Secondary | ICD-10-CM | POA: Diagnosis present

## 2023-04-23 DIAGNOSIS — R2681 Unsteadiness on feet: Secondary | ICD-10-CM | POA: Insufficient documentation

## 2023-04-23 DIAGNOSIS — R471 Dysarthria and anarthria: Secondary | ICD-10-CM | POA: Insufficient documentation

## 2023-04-23 DIAGNOSIS — G20A1 Parkinson's disease without dyskinesia, without mention of fluctuations: Secondary | ICD-10-CM | POA: Insufficient documentation

## 2023-04-23 DIAGNOSIS — R262 Difficulty in walking, not elsewhere classified: Secondary | ICD-10-CM | POA: Insufficient documentation

## 2023-04-23 DIAGNOSIS — R278 Other lack of coordination: Secondary | ICD-10-CM | POA: Insufficient documentation

## 2023-04-23 DIAGNOSIS — M546 Pain in thoracic spine: Secondary | ICD-10-CM | POA: Diagnosis present

## 2023-04-23 DIAGNOSIS — R2689 Other abnormalities of gait and mobility: Secondary | ICD-10-CM | POA: Diagnosis present

## 2023-04-23 NOTE — Therapy (Signed)
 OUTPATIENT PHYSICAL THERAPY THORACOLUMBAR Treatment   Patient Name: Katelyn Crawford MRN: [id number] DOB:1940-12-28, 83 y.o., female Today's Date: 04/23/2023  END OF SESSION:  PT End of Session - 04/23/23 1015     Visit Number 17    Number of Visits 24    Date for PT Re-Evaluation 05/12/23    Progress Note Due on Visit 10    PT Start Time 1018    PT Stop Time 1058    PT Time Calculation (min) 40 min    Equipment Utilized During Treatment Gait belt    Activity Tolerance Patient tolerated treatment well    Behavior During Therapy WFL for tasks assessed/performed                Past Medical History:  Diagnosis Date   Anemia    Aortic atherosclerosis (HCC)    Arthritis    Cardiomegaly    Cardiomyopathy (HCC)    Cholelithiasis    Chronic kidney disease, stage 2 (mild)    Chronic sinus bradycardia    Coronary artery disease 03/10/2012   a.) LHC 03/10/2012: 40% mLAD, 50% pRI, 50% pRCA, 90% mRCA, 50% dRCA, 50% RPDA - unable to cannulate RCA - med mgmt; b.) LHC/PCI 06/25/2013: 30% pLAD, 40% mLAD, 20% mLCx, 30% RI, 30% pRCA, 70% mRCA (2.5 mm Promus DES), 40% dRCA-1, 90% dRCA-2 (2.5 mm Promus DES)   DDD (degenerative disc disease), thoracolumbar    Diverticulosis    Dizziness    Environmental allergies    Full dentures    GERD (gastroesophageal reflux disease) 06/25/2013   H/O bilateral cataract extraction 2019   History of diverticulosis    History of kidney stones    HLD (hyperlipidemia)    Hyperlipidemia, unspecified    Hypertension    Nephrolithiasis    Osteoporosis, post-menopausal    Parkinson disease (HCC)    Pre-diabetes    PVC's (premature ventricular contractions)    Scoliosis    Thoracic kyphosis    Trifascicular bundle branch block    a.) 1st degree AVB + RBBB + LAFB   Vascular disease    Past Surgical History:  Procedure Laterality Date   APPENDECTOMY     BREAST EXCISIONAL BIOPSY Left 1984   neg   BREAST EXCISIONAL BIOPSY Right 1997   lipoma    CATARACT EXTRACTION W/PHACO Left 09/02/2017   Procedure: CATARACT EXTRACTION PHACO AND INTRAOCULAR LENS PLACEMENT (IOC)  LEFT;  Surgeon: Lockie Mola, MD;  Location: [facility];  Service: Ophthalmology;  Laterality: Left;   CATARACT EXTRACTION W/PHACO Right 10/01/2017   Procedure: CATARACT EXTRACTION PHACO AND INTRAOCULAR LENS PLACEMENT (IOC) RIGHT;  Surgeon: Lockie Mola, MD;  Location: [facility];  Service: Ophthalmology;  Laterality: Right;   COLONOSCOPY WITH PROPOFOL N/A 11/27/2016   Procedure: COLONOSCOPY WITH PROPOFOL;  Surgeon: Toledo, Boykin Nearing, MD;  Location: [facility];  Service: Gastroenterology;  Laterality: N/A;   CORONARY ANGIOPLASTY WITH STENT PLACEMENT Left 06/25/2013   Procedure: CORONARY ANGIOPLASTY WITH STENT PLACEMENT; Location: [facility]; Surgeon: Rolly Salter, MD   KNEE ARTHROPLASTY Left 01/22/2017   Procedure: COMPUTER ASSISTED TOTAL KNEE ARTHROPLASTY;  Surgeon: Donato Heinz, MD;  Location: [facility];  Service: Orthopedics;  Laterality: Left;   KNEE ARTHROSCOPY Left 02/01/2015   Procedure: ARTHROSCOPY KNEE, partial medial & lateral menisectomy,,medial and patelofemoral chondroplasty;  Surgeon: Donato Heinz, MD;  Location: [facility];  Service: Orthopedics;  Laterality: Left;   LEFT HEART CATH AND CORONARY ANGIOGRAPHY Left 03/10/2012   Procedure: LEFT HEART CATH AND  CORONARY ANGIOGRAPHY; Location: [facility]; Surgeon: Marcina Millard, MD   LITHOTRIPSY Right 1995   RIGHT AXILLARY LIPOMA REMOVAL     TUBAL LIGATION     VARICOSE VEIN SURGERY     Patient Active Problem List   Diagnosis Date Noted   Primary osteoarthritis of hip 02/09/2017   Primary osteoarthritis of knee 02/09/2017   Chronic sinus bradycardia 01/22/2017   Degenerative disc disease, lumbar 01/22/2017   Dizziness 01/22/2017   Nephrolithiasis 01/22/2017   Osteoporosis, 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/22/2017   PVC's (premature ventricular contractions) 01/22/2017   S/P total knee  arthroplasty 01/22/2017   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   CKD (chronic kidney disease), 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   GERD (gastroesophageal reflux disease) 06/25/2013   Coronary artery disease involving native coronary artery of native heart 06/24/2013   Essential hypertension 06/21/2013   Mixed hyperlipidemia 06/21/2013    PCP: Marguarite Arbour, MD   REFERRING PROVIDER: Meeler, Jodelle Gross, FNP  REFERRING DIAG:  Diagnosis  [phone] (ICD-10-CM) - Lumbar stenosis with neurogenic claudication  M54.14 (ICD-10-CM) - Thoracic radiculitis    Rationale for Evaluation and Treatment: Rehabilitation  THERAPY DIAG:   Muscle weakness (generalized)  Other lack of coordination  Parkinson's disease, unspecified whether dyskinesia present, unspecified whether manifestations fluctuate (HCC)  Other abnormalities of gait and mobility  Chronic bilateral thoracic back pain  Difficulty in walking, not elsewhere classified  Unsteadiness on feet  ONSET DATE: >3 months   SUBJECTIVE:                                                                                                                                                                                           SUBJECTIVE STATEMENT:   Pt reports that she is doing okay today, but woke up with quite a bit of pain this morning, but it has eased off. Also states that she found out that her uncle passed away earlier this morning. Reports that the memorial service will be this Sunday afternoon.  At start of PT treatment, states that pain has decreased to 1/10.    PERTINENT HISTORY:  Hx of PD and familiar to this clinic for PD management.  From recent MD appointment:  "The patient is a pleasant 83 year old female who presents today for acute on chronic bilateral low back pain without radiation to the lower extremities. She describes pain that began June 2020 without injury. She also states that she has had some form of back pain for many  years. She describes her pain as mild to moderate that is sharp and intermittent. Bending and lifting increases her pain. Rest can help alleviate her pain. She participated in physical  therapy at [facility] clinic in early 2020 with little relief. She continues to stay active in her home and notices that household activities increase her pain such as vacuuming and mopping. Medications include Tylenol (mild to moderate relief).  She was last evaluated on 10/09/2022 at which time she was doing well. She was to continue with heat and home cycling along with physical therapy exercises. She was to continue with tramadol nightly as needed.  At today's visit she reports that she is experience of increasing pain in the morning in her mid back about the bra line. She would like to return to physical therapy and she states that this was very helpful in managing her pain. We discussed if her pain was to continue consideration would be made for another epidural. She states that she is taking tramadol at night and doing well with this. Denies any injury. Upon palpation today she is without tenderness to the thoracic paraspinal musculature. "  PAIN:  Are you having pain? Yes: NPRS scale: 0/10 Pain location: middle back Pain description: ache Aggravating factors: sleeping Relieving factors: tramadol   PRECAUTIONS: Fall   RED FLAGS: None   WEIGHT BEARING RESTRICTIONS: No  FALLS:  Has patient fallen in last 6 months? No  LIVING ENVIRONMENT: Lives with: lives alone and daughter lives next door. Lives in: House/apartment Stairs: Yes: External: 4 steps; on right going up and on left going up Has following equipment at home:  None, but has use cane in the past. Has shower rails, shower chair; doesn't use  OCCUPATION: retired   PLOF: Independent, Independent with basic ADLs, and Independent with gait  PATIENT GOALS: stronger. Improve mobility   NEXT MD VISIT: unsure   OBJECTIVE:  Note: Objective  measures were completed at Evaluation unless otherwise noted.  DIAGNOSTIC FINDINGS:  No recent imaging   PATIENT SURVEYS:  Modified Oswestry 28%   COGNITION: Overall cognitive status: Within functional limits for tasks assessed     SENSATION: WFL  MUSCLE LENGTH: WFL  POSTURE: rounded shoulders, forward head, and decreased lumbar lordosis  PALPATION: Noted tightness in upper thoracic paraspinal. No pain to palpation   LUMBAR ROM:   AROM eval  Flexion WFL  Extension 20  Right lateral flexion   Left lateral flexion   Right rotation minimal  Left rotation ~10 deg   Cervical  AROM eval  Flexion 45  Extension 18  Right lateral flexion 20  Left lateral flexion 12  Right rotation 50  Left rotation  40  Thoracic  AROM eval  Flexion WFL  Extension 10  Right lateral flexion 25  Left lateral flexion 20  Right rotation minimal  Left rotation ~10 deg      (Blank rows = not tested)  LOWER EXTREMITY ROM:     Grossly WFL  LOWER EXTREMITY MMT:    MMT Right eval Left eval  Hip flexion 4- 4-  Hip extension    Hip abduction 4- 4-  Hip adduction 4 4  Hip internal rotation    Hip external rotation    Knee flexion 4 4  Knee extension 4+ 4+  Ankle dorsiflexion 4 4  Ankle plantarflexion    Ankle inversion    Ankle eversion     (Blank rows = not tested)    FUNCTIONAL TESTS:  5 times sit to stand: 16  Timed up and go (TUG): 12.3 sce 6 minute walk test: 1132ft 10 meter walk test: 0.64m/s  GAIT: Distance walked: 70 Assistive device utilized: None Level  of assistance: Complete Independence Comments: flexed posture.   TREATMENT DATE: 02/17/2023  Nustep level 1-3 x 7 min. Improved posture noted in seat of nustep on this day. Reports RPE only 3/10 upton completion and  No rest break required.   Lateral step up on 6 inch step x 10 bil with BUE support Forward Step up onto 6 inch step with Bil rails x 10 bil  and instruction to increase pull with BUE to engage  lower lats/lower traps.  Sit<>stand with UE swing into extension x 10, difficulty with sequencing of movement.  UT stretch 2 x 20 sec bil  Partial squat  with UE assist on rail x 12  Lateral flexion to the L with 5 # AW in the RUE 2x 10 Sit<>stand with UE swing into extension x 10   Throughout session, supervision assist from PT for safety.  No LOB noted improved sequencing of PD specific movements on this day.   PATIENT EDUCATION:  Education details:exercise technique, large-amplitude training/purpose, techniques for donning jacket, finger flicks Person educated: Patient Education method: IT trainer, VC Education comprehension: verbalized understanding, returned demo  HOME EXERCISE PROGRAM: Access Code: [email or web address] URL: [email or web address] Date: 02/20/2023 Prepared by: Grier Rocher  Exercises - Sit to Stand with Arm Swing  - 1 x daily - 7 x weekly - 2 sets - 10 reps - 2 hold - Seated Reach Forward, Up, and To Sides  - 1 x daily - 7 x weekly - 3 sets - 10 reps - Seated Reaching to Side and Across Body  - 1 x daily - 7 x weekly - 3 sets - 10 reps - Standing Row with Anchored Resistance  - 1 x daily - 7 x weekly - 3 sets - 10 reps  ASSESSMENT:  CLINICAL IMPRESSION: Pt put forth excellent effort towards therapeutic interventions to address PD and mid/upper back pain. Continue to focus PT treatment on improved lower and mid trap activation as well as normalized movement patterns to prevent lateral trunk rotation to the R. Tolerated all interventions well on this day. Pt will benefit from PT to address back pain improve strength, and overall fucntional mobility   OBJECTIVE IMPAIRMENTS: Abnormal gait, cardiopulmonary status limiting activity, decreased activity tolerance, decreased balance, decreased endurance, decreased mobility, difficulty walking, decreased ROM, decreased strength, hypomobility, increased fascial restrictions, impaired perceived functional ability,  impaired flexibility, impaired UE functional use, improper body mechanics, and postural dysfunction.   ACTIVITY LIMITATIONS: carrying, squatting, sleeping, and locomotion level  PARTICIPATION LIMITATIONS: cleaning, laundry, shopping, and community activity  PERSONAL FACTORS: Age, Fitness, and 1-2 comorbidities: lumbar stenosis   are also affecting patient's functional outcome.   REHAB POTENTIAL: Good  CLINICAL DECISION MAKING: Stable/uncomplicated  EVALUATION COMPLEXITY: Low   GOALS: Goals reviewed with patient? Yes   SHORT TERM GOALS: Target date: 03/18/2023    Patient will be independent in home exercise program to improve strength/mobility for better functional independence with ADLs. Baseline: to be given at session 2.  Goal status: INITIAL   LONG TERM GOALS: Target date: 05/12/2023    Patient will improve Mod ODI score by 12.8 points  to demonstrate statistically significant improvement in mobility and quality of life.  Baseline: 28% 3/12: 26%  Goal status: INITIAL  2.  Patient (> 3 years old) will complete five times sit to stand test in < 15 seconds indicating an increased LE strength and improved balance. Baseline: 16 sec  3/12: 11.81. sec  Goal status: MET  3.  Patient will increase 6  min walk test score by >150 to demonstrated improved access to community and improve safety.   Baseline: 1128ft 3/12: 1223ft Goal status: INITIAL  4.  Patient will increase 10 meter walk test to >1.43m/s as to improve gait speed for better community ambulation and to reduce fall risk. Baseline: 0.66m/s  3/12: 1.13m/s Goal status: MET  5.  Patient will reduce timed up and go to <11 seconds to reduce fall risk and demonstrate improved transfer/gait ability. Baseline: 13sec  3/12; 12.5sec  Goal status: IN PROGRESS  6.  Patient will reduced back pain to 3/10 at worst to indicate improved function with hold tasks.  Baseline: 9/10 at night.  3/12: 8/10, when waking up in the  morning Goal status: IN PROGRESS s PLAN:  PT FREQUENCY: 1-2x/week  PT DURATION: 12 weeks  PLANNED INTERVENTIONS: 97110-Therapeutic exercises, 97530- Therapeutic activity, 97112- Neuromuscular re-education, 97535- Self Care, 16109- Manual therapy, 97116- Gait training, 97014- Electrical stimulation (unattended), [phone]- Electrical stimulation (manual), Balance training, Stair training, Taping, Dry Needling, Joint mobilization, Joint manipulation, Spinal manipulation, Spinal mobilization, Scar mobilization, DME instructions, Cryotherapy, and Moist heat.  PLAN FOR NEXT SESSION:   Continue to address back pain and trunk mobility deficits PD treatment. Continue with balance and LE strengthening, if back pain tolerates.    Golden Pop, PT 04/23/2023, 10:16 AM

## 2023-04-28 ENCOUNTER — Ambulatory Visit: Payer: Medicare Other | Admitting: Physical Therapy

## 2023-04-28 DIAGNOSIS — R2689 Other abnormalities of gait and mobility: Secondary | ICD-10-CM

## 2023-04-28 DIAGNOSIS — M6281 Muscle weakness (generalized): Secondary | ICD-10-CM | POA: Diagnosis not present

## 2023-04-28 DIAGNOSIS — G20A1 Parkinson's disease without dyskinesia, without mention of fluctuations: Secondary | ICD-10-CM

## 2023-04-28 DIAGNOSIS — R2681 Unsteadiness on feet: Secondary | ICD-10-CM

## 2023-04-28 DIAGNOSIS — R278 Other lack of coordination: Secondary | ICD-10-CM

## 2023-04-28 DIAGNOSIS — G8929 Other chronic pain: Secondary | ICD-10-CM

## 2023-04-28 DIAGNOSIS — R262 Difficulty in walking, not elsewhere classified: Secondary | ICD-10-CM

## 2023-04-28 NOTE — Therapy (Signed)
 OUTPATIENT PHYSICAL THERAPY THORACOLUMBAR Treatment   Patient Name: Katelyn Crawford MRN: [id number] DOB:[birth year], 83 y.o., female Today's Date: 04/28/2023  END OF SESSION:  PT End of Session - 04/28/23 1028     Visit Number 18    Number of Visits 24    Date for PT Re-Evaluation 05/12/23    Progress Note Due on Visit 10    PT Start Time 1015    PT Stop Time 1055    PT Time Calculation (min) 40 min    Equipment Utilized During Treatment Gait belt    Activity Tolerance Patient tolerated treatment well    Behavior During Therapy WFL for tasks assessed/performed                Past Medical History:  Diagnosis Date   Anemia    Aortic atherosclerosis (HCC)    Arthritis    Cardiomegaly    Cardiomyopathy (HCC)    Cholelithiasis    Chronic kidney disease, stage 2 (mild)    Chronic sinus bradycardia    Coronary artery disease 03/10/2012   a.) LHC 03/10/2012: 40% mLAD, 50% pRI, 50% pRCA, 90% mRCA, 50% dRCA, 50% RPDA - unable to cannulate RCA - med mgmt; b.) LHC/PCI 06/25/2013: 30% pLAD, 40% mLAD, 20% mLCx, 30% RI, 30% pRCA, 70% mRCA (2.5 mm Promus DES), 40% dRCA-1, 90% dRCA-2 (2.5 mm Promus DES)   DDD (degenerative disc disease), thoracolumbar    Diverticulosis    Dizziness    Environmental allergies    Full dentures    GERD (gastroesophageal reflux disease) 06/25/2013   H/O bilateral cataract extraction 2019   History of diverticulosis    History of kidney stones    HLD (hyperlipidemia)    Hyperlipidemia, unspecified    Hypertension    Nephrolithiasis    Osteoporosis, post-menopausal    Parkinson disease (HCC)    Pre-diabetes    PVC's (premature ventricular contractions)    Scoliosis    Thoracic kyphosis    Trifascicular bundle branch block    a.) 1st degree AVB + RBBB + LAFB   Vascular disease    Past Surgical History:  Procedure Laterality Date   APPENDECTOMY     BREAST EXCISIONAL BIOPSY Left 1984   neg   BREAST EXCISIONAL BIOPSY Right 1997   lipoma    CATARACT EXTRACTION W/PHACO Left 09/02/2017   Procedure: CATARACT EXTRACTION PHACO AND INTRAOCULAR LENS PLACEMENT (IOC)  LEFT;  Surgeon: Lockie Mola, MD;  Location: [facility];  Service: Ophthalmology;  Laterality: Left;   CATARACT EXTRACTION W/PHACO Right 10/01/2017   Procedure: CATARACT EXTRACTION PHACO AND INTRAOCULAR LENS PLACEMENT (IOC) RIGHT;  Surgeon: Lockie Mola, MD;  Location: [facility];  Service: Ophthalmology;  Laterality: Right;   COLONOSCOPY WITH PROPOFOL N/A 11/27/2016   Procedure: COLONOSCOPY WITH PROPOFOL;  Surgeon: Toledo, Boykin Nearing, MD;  Location: [facility];  Service: Gastroenterology;  Laterality: N/A;   CORONARY ANGIOPLASTY WITH STENT PLACEMENT Left 06/25/2013   Procedure: CORONARY ANGIOPLASTY WITH STENT PLACEMENT; Location: [facility]; Surgeon: Rolly Salter, MD   KNEE ARTHROPLASTY Left 01/22/2017   Procedure: COMPUTER ASSISTED TOTAL KNEE ARTHROPLASTY;  Surgeon: Donato Heinz, MD;  Location: [facility];  Service: Orthopedics;  Laterality: Left;   KNEE ARTHROSCOPY Left 02/01/2015   Procedure: ARTHROSCOPY KNEE, partial medial & lateral menisectomy,,medial and patelofemoral chondroplasty;  Surgeon: Donato Heinz, MD;  Location: [facility];  Service: Orthopedics;  Laterality: Left;   LEFT HEART CATH AND CORONARY ANGIOGRAPHY Left 03/10/2012   Procedure: LEFT HEART CATH AND  CORONARY ANGIOGRAPHY; Location: [facility]; Surgeon: Marcina Millard, MD   LITHOTRIPSY Right 1995   RIGHT AXILLARY LIPOMA REMOVAL     TUBAL LIGATION     VARICOSE VEIN SURGERY     Patient Active Problem List   Diagnosis Date Noted   Primary osteoarthritis of hip 02/09/2017   Primary osteoarthritis of knee 02/09/2017   Chronic sinus bradycardia 01/22/2017   Degenerative disc disease, lumbar 01/22/2017   Dizziness 01/22/2017   Nephrolithiasis 01/22/2017   Osteoporosis, 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/22/2017   PVC's (premature ventricular contractions) 01/22/2017   S/P total knee  arthroplasty 01/22/2017   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   CKD (chronic kidney disease), 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   GERD (gastroesophageal reflux disease) 06/25/2013   Coronary artery disease involving native coronary artery of native heart 06/24/2013   Essential hypertension 06/21/2013   Mixed hyperlipidemia 06/21/2013    PCP: Marguarite Arbour, MD   REFERRING PROVIDER: Meeler, Jodelle Gross, FNP  REFERRING DIAG:  Diagnosis  [phone] (ICD-10-CM) - Lumbar stenosis with neurogenic claudication  M54.14 (ICD-10-CM) - Thoracic radiculitis    Rationale for Evaluation and Treatment: Rehabilitation  THERAPY DIAG:   Muscle weakness (generalized)  Parkinson's disease, unspecified whether dyskinesia present, unspecified whether manifestations fluctuate (HCC)  Other abnormalities of gait and mobility  Chronic bilateral thoracic back pain  Difficulty in walking, not elsewhere classified  Other lack of coordination  Unsteadiness on feet  ONSET DATE: >3 months   SUBJECTIVE:                                                                                                                                                                                           SUBJECTIVE STATEMENT:   Pt reports that she is doing okay today. Had a very busy weekend; daughter's birthday, funeral, granddaughter's birthday. Had a little pain on Saturday after making breakfast, but is feeling much better today.    PERTINENT HISTORY:  Hx of PD and familiar to this clinic for PD management.  From recent MD appointment:  "The patient is a pleasant 83 year old female who presents today for acute on chronic bilateral low back pain without radiation to the lower extremities. She describes pain that began June 2020 without injury. She also states that she has had some form of back pain for many years. She describes her pain as mild to moderate that is sharp and intermittent. Bending and lifting increases her  pain. Rest can help alleviate her pain. She participated in physical therapy at [facility] clinic in early 2020 with little relief. She continues to stay active in her home and notices that household activities increase her pain such as  vacuuming and mopping. Medications include Tylenol (mild to moderate relief).  She was last evaluated on 10/09/2022 at which time she was doing well. She was to continue with heat and home cycling along with physical therapy exercises. She was to continue with tramadol nightly as needed.  At today's visit she reports that she is experience of increasing pain in the morning in her mid back about the bra line. She would like to return to physical therapy and she states that this was very helpful in managing her pain. We discussed if her pain was to continue consideration would be made for another epidural. She states that she is taking tramadol at night and doing well with this. Denies any injury. Upon palpation today she is without tenderness to the thoracic paraspinal musculature. "  PAIN:  Are you having pain? Yes: NPRS scale: 0/10 Pain location: middle back Pain description: ache Aggravating factors: sleeping Relieving factors: tramadol   PRECAUTIONS: Fall   RED FLAGS: None   WEIGHT BEARING RESTRICTIONS: No  FALLS:  Has patient fallen in last 6 months? No  LIVING ENVIRONMENT: Lives with: lives alone and daughter lives next door. Lives in: House/apartment Stairs: Yes: External: 4 steps; on right going up and on left going up Has following equipment at home:  None, but has use cane in the past. Has shower rails, shower chair; doesn't use  OCCUPATION: retired   PLOF: Independent, Independent with basic ADLs, and Independent with gait  PATIENT GOALS: stronger. Improve mobility   NEXT MD VISIT: unsure   OBJECTIVE:  Note: Objective measures were completed at Evaluation unless otherwise noted.  DIAGNOSTIC FINDINGS:  No recent imaging   PATIENT  SURVEYS:  Modified Oswestry 28%   COGNITION: Overall cognitive status: Within functional limits for tasks assessed     SENSATION: WFL  MUSCLE LENGTH: WFL  POSTURE: rounded shoulders, forward head, and decreased lumbar lordosis  PALPATION: Noted tightness in upper thoracic paraspinal. No pain to palpation   LUMBAR ROM:   AROM eval  Flexion WFL  Extension 20  Right lateral flexion   Left lateral flexion   Right rotation minimal  Left rotation ~10 deg   Cervical  AROM eval  Flexion 45  Extension 18  Right lateral flexion 20  Left lateral flexion 12  Right rotation 50  Left rotation  40  Thoracic  AROM eval  Flexion WFL  Extension 10  Right lateral flexion 25  Left lateral flexion 20  Right rotation minimal  Left rotation ~10 deg      (Blank rows = not tested)  LOWER EXTREMITY ROM:     Grossly WFL  LOWER EXTREMITY MMT:    MMT Right eval Left eval  Hip flexion 4- 4-  Hip extension    Hip abduction 4- 4-  Hip adduction 4 4  Hip internal rotation    Hip external rotation    Knee flexion 4 4  Knee extension 4+ 4+  Ankle dorsiflexion 4 4  Ankle plantarflexion    Ankle inversion    Ankle eversion     (Blank rows = not tested)    FUNCTIONAL TESTS:  5 times sit to stand: 16  Timed up and go (TUG): 12.3 sce 6 minute walk test: 1138ft 10 meter walk test: 0.59m/s  GAIT: Distance walked: 70 Assistive device utilized: None Level of assistance: Complete Independence Comments: flexed posture.   TREATMENT DATE: 02/17/2023  Octane level 1-3 x 7 min. Improved posture noted in seat of nustep on this  day. Reports RPE  4/10 upton completion and  No rest break required.   Lateral step over cane and trunk rotation to touch target x 8 bil  Over head reach x 6 bil  Forward lunge x 8 with BUE support to create pec stretch  Standing shoulder extension RTB 2x 15  Foot tap on 6 inch step with contralateral shoulder flexion x 10 bil  Lateral  step up on 6  inch step x 8 bil with BUE support Forward Step up onto 6 inch step with Bil rails x 10 bil   Throughout session, supervision assist from PT for safety.  No LOB noted improved sequencing of PD specific movements on this day.   PATIENT EDUCATION:  Education details:exercise technique, large-amplitude training/purpose, techniques for donning jacket, finger flicks Person educated: Patient Education method: IT trainer, VC Education comprehension: verbalized understanding, returned demo  HOME EXERCISE PROGRAM: Access Code: [email or web address] URL: [email or web address] Date: 02/20/2023 Prepared by: Grier Rocher  Exercises - Sit to Stand with Arm Swing  - 1 x daily - 7 x weekly - 2 sets - 10 reps - 2 hold - Seated Reach Forward, Up, and To Sides  - 1 x daily - 7 x weekly - 3 sets - 10 reps - Seated Reaching to Side and Across Body  - 1 x daily - 7 x weekly - 3 sets - 10 reps - Standing Row with Anchored Resistance  - 1 x daily - 7 x weekly - 3 sets - 10 reps  ASSESSMENT:  CLINICAL IMPRESSION: Pt put forth excellent effort towards therapeutic interventions to address PD and mid/upper back pain.  Reduced pain pain on this day allows improved attention to posture, trunk mobility and increased amplitude movements. Tolerated all interventions well on this day. No LOB, but difficulty with reciprocal movement patterns.  Pt will benefit from PT to address back pain improve strength, and overall fucntional mobility   OBJECTIVE IMPAIRMENTS: Abnormal gait, cardiopulmonary status limiting activity, decreased activity tolerance, decreased balance, decreased endurance, decreased mobility, difficulty walking, decreased ROM, decreased strength, hypomobility, increased fascial restrictions, impaired perceived functional ability, impaired flexibility, impaired UE functional use, improper body mechanics, and postural dysfunction.   ACTIVITY LIMITATIONS: carrying, squatting, sleeping, and locomotion  level  PARTICIPATION LIMITATIONS: cleaning, laundry, shopping, and community activity  PERSONAL FACTORS: Age, Fitness, and 1-2 comorbidities: lumbar stenosis   are also affecting patient's functional outcome.   REHAB POTENTIAL: Good  CLINICAL DECISION MAKING: Stable/uncomplicated  EVALUATION COMPLEXITY: Low   GOALS: Goals reviewed with patient? Yes   SHORT TERM GOALS: Target date: 03/18/2023    Patient will be independent in home exercise program to improve strength/mobility for better functional independence with ADLs. Baseline: to be given at session 2.  Goal status: INITIAL   LONG TERM GOALS: Target date: 05/12/2023    Patient will improve Mod ODI score by 12.8 points  to demonstrate statistically significant improvement in mobility and quality of life.  Baseline: 28% 3/12: 26%  Goal status: INITIAL  2.  Patient (> 71 years old) will complete five times sit to stand test in < 15 seconds indicating an increased LE strength and improved balance. Baseline: 16 sec  3/12: 11.81. sec  Goal status: MET  3.  Patient will increase 6 min walk test score by >150 to demonstrated improved access to community and improve safety.   Baseline: 1153ft 3/12: 1216ft Goal status: INITIAL  4.  Patient will increase 10 meter walk test to >1.54m/s as to improve gait speed  for better community ambulation and to reduce fall risk. Baseline: 0.10m/s  3/12: 1.33m/s Goal status: MET  5.  Patient will reduce timed up and go to <11 seconds to reduce fall risk and demonstrate improved transfer/gait ability. Baseline: 13sec  3/12; 12.5sec  Goal status: IN PROGRESS  6.  Patient will reduced back pain to 3/10 at worst to indicate improved function with hold tasks.  Baseline: 9/10 at night.  3/12: 8/10, when waking up in the morning Goal status: IN PROGRESS s PLAN:  PT FREQUENCY: 1-2x/week  PT DURATION: 12 weeks  PLANNED INTERVENTIONS: 97110-Therapeutic exercises, 97530- Therapeutic  activity, 97112- Neuromuscular re-education, 97535- Self Care, 16109- Manual therapy, 97116- Gait training, 97014- Electrical stimulation (unattended), [phone]- Electrical stimulation (manual), Balance training, Stair training, Taping, Dry Needling, Joint mobilization, Joint manipulation, Spinal manipulation, Spinal mobilization, Scar mobilization, DME instructions, Cryotherapy, and Moist heat.  PLAN FOR NEXT SESSION:   Continue to address back pain and trunk mobility deficits PD treatment. Continue with balance and LE strengthening, if back pain tolerates.    Golden Pop, PT 04/28/2023, 10:29 AM

## 2023-04-30 ENCOUNTER — Ambulatory Visit: Payer: Medicare Other | Admitting: Physical Therapy

## 2023-05-01 ENCOUNTER — Ambulatory Visit: Admitting: Physical Therapy

## 2023-05-01 DIAGNOSIS — G20A1 Parkinson's disease without dyskinesia, without mention of fluctuations: Secondary | ICD-10-CM

## 2023-05-01 DIAGNOSIS — M6281 Muscle weakness (generalized): Secondary | ICD-10-CM

## 2023-05-01 DIAGNOSIS — G8929 Other chronic pain: Secondary | ICD-10-CM

## 2023-05-01 DIAGNOSIS — R2681 Unsteadiness on feet: Secondary | ICD-10-CM

## 2023-05-01 DIAGNOSIS — R2689 Other abnormalities of gait and mobility: Secondary | ICD-10-CM

## 2023-05-01 DIAGNOSIS — R278 Other lack of coordination: Secondary | ICD-10-CM

## 2023-05-01 DIAGNOSIS — R262 Difficulty in walking, not elsewhere classified: Secondary | ICD-10-CM

## 2023-05-01 NOTE — Therapy (Signed)
 OUTPATIENT PHYSICAL THERAPY THORACOLUMBAR Treatment   Patient Name: AUBREANNA PERCLE MRN: [id number] DOB:[birth year], 83 y.o., female Today's Date: 05/01/2023  END OF SESSION:  PT End of Session - 05/01/23 0907     Visit Number 19    Number of Visits 24    Date for PT Re-Evaluation 05/12/23    Progress Note Due on Visit 10    PT Start Time 0849    PT Stop Time 0930    PT Time Calculation (min) 41 min    Equipment Utilized During Treatment Gait belt    Activity Tolerance Patient tolerated treatment well    Behavior During Therapy WFL for tasks assessed/performed                Past Medical History:  Diagnosis Date   Anemia    Aortic atherosclerosis (HCC)    Arthritis    Cardiomegaly    Cardiomyopathy (HCC)    Cholelithiasis    Chronic kidney disease, stage 2 (mild)    Chronic sinus bradycardia    Coronary artery disease 03/10/2012   a.) LHC 03/10/2012: 40% mLAD, 50% pRI, 50% pRCA, 90% mRCA, 50% dRCA, 50% RPDA - unable to cannulate RCA - med mgmt; b.) LHC/PCI 06/25/2013: 30% pLAD, 40% mLAD, 20% mLCx, 30% RI, 30% pRCA, 70% mRCA (2.5 mm Promus DES), 40% dRCA-1, 90% dRCA-2 (2.5 mm Promus DES)   DDD (degenerative disc disease), thoracolumbar    Diverticulosis    Dizziness    Environmental allergies    Full dentures    GERD (gastroesophageal reflux disease) 06/25/2013   H/O bilateral cataract extraction 2019   History of diverticulosis    History of kidney stones    HLD (hyperlipidemia)    Hyperlipidemia, unspecified    Hypertension    Nephrolithiasis    Osteoporosis, post-menopausal    Parkinson disease (HCC)    Pre-diabetes    PVC's (premature ventricular contractions)    Scoliosis    Thoracic kyphosis    Trifascicular bundle branch block    a.) 1st degree AVB + RBBB + LAFB   Vascular disease    Past Surgical History:  Procedure Laterality Date   APPENDECTOMY     BREAST EXCISIONAL BIOPSY Left 1984   neg   BREAST EXCISIONAL BIOPSY Right 1997   lipoma    CATARACT EXTRACTION W/PHACO Left 09/02/2017   Procedure: CATARACT EXTRACTION PHACO AND INTRAOCULAR LENS PLACEMENT (IOC)  LEFT;  Surgeon: Lockie Mola, MD;  Location: [facility];  Service: Ophthalmology;  Laterality: Left;   CATARACT EXTRACTION W/PHACO Right 10/01/2017   Procedure: CATARACT EXTRACTION PHACO AND INTRAOCULAR LENS PLACEMENT (IOC) RIGHT;  Surgeon: Lockie Mola, MD;  Location: [facility];  Service: Ophthalmology;  Laterality: Right;   COLONOSCOPY WITH PROPOFOL N/A 11/27/2016   Procedure: COLONOSCOPY WITH PROPOFOL;  Surgeon: Toledo, Boykin Nearing, MD;  Location: [facility];  Service: Gastroenterology;  Laterality: N/A;   CORONARY ANGIOPLASTY WITH STENT PLACEMENT Left 06/25/2013   Procedure: CORONARY ANGIOPLASTY WITH STENT PLACEMENT; Location: [facility]; Surgeon: Rolly Salter, MD   KNEE ARTHROPLASTY Left 01/22/2017   Procedure: COMPUTER ASSISTED TOTAL KNEE ARTHROPLASTY;  Surgeon: Donato Heinz, MD;  Location: [facility];  Service: Orthopedics;  Laterality: Left;   KNEE ARTHROSCOPY Left 02/01/2015   Procedure: ARTHROSCOPY KNEE, partial medial & lateral menisectomy,,medial and patelofemoral chondroplasty;  Surgeon: Donato Heinz, MD;  Location: [facility];  Service: Orthopedics;  Laterality: Left;   LEFT HEART CATH AND CORONARY ANGIOGRAPHY Left 03/10/2012   Procedure: LEFT HEART CATH AND  CORONARY ANGIOGRAPHY; Location: [facility]; Surgeon: Marcina Millard, MD   LITHOTRIPSY Right 1995   RIGHT AXILLARY LIPOMA REMOVAL     TUBAL LIGATION     VARICOSE VEIN SURGERY     Patient Active Problem List   Diagnosis Date Noted   Primary osteoarthritis of hip 02/09/2017   Primary osteoarthritis of knee 02/09/2017   Chronic sinus bradycardia 01/22/2017   Degenerative disc disease, lumbar 01/22/2017   Dizziness 01/22/2017   Nephrolithiasis 01/22/2017   Osteoporosis, 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/22/2017   PVC's (premature ventricular contractions) 01/22/2017   S/P total knee  arthroplasty 01/22/2017   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   CKD (chronic kidney disease), 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   GERD (gastroesophageal reflux disease) 06/25/2013   Coronary artery disease involving native coronary artery of native heart 06/24/2013   Essential hypertension 06/21/2013   Mixed hyperlipidemia 06/21/2013    PCP: Marguarite Arbour, MD   REFERRING PROVIDER: Meeler, Jodelle Gross, FNP  REFERRING DIAG:  Diagnosis  [phone] (ICD-10-CM) - Lumbar stenosis with neurogenic claudication  M54.14 (ICD-10-CM) - Thoracic radiculitis    Rationale for Evaluation and Treatment: Rehabilitation  THERAPY DIAG:   Muscle weakness (generalized)  Parkinson's disease, unspecified whether dyskinesia present, unspecified whether manifestations fluctuate (HCC)  Other abnormalities of gait and mobility  Chronic bilateral thoracic back pain  Difficulty in walking, not elsewhere classified  Other lack of coordination  Unsteadiness on feet  ONSET DATE: >3 months   SUBJECTIVE:                                                                                                                                                                                           SUBJECTIVE STATEMENT:   Pt reports that she is doing okay today. Woke up without back pain this AM. Reports a little lower back pain while getting dressed, but it has passed. Was seen by neurology yesterday. He is glad that she is back in PT, and would like her to continue as appropriate.      PERTINENT HISTORY:  Hx of PD and familiar to this clinic for PD management.  From recent MD appointment:  "The patient is a pleasant 83 year old female who presents today for acute on chronic bilateral low back pain without radiation to the lower extremities. She describes pain that began June 2020 without injury. She also states that she has had some form of back pain for many years. She describes her pain as mild to moderate that is  sharp and intermittent. Bending and lifting increases her pain. Rest can help alleviate her pain. She participated in physical therapy at [facility] clinic in early 2020 with little  relief. She continues to stay active in her home and notices that household activities increase her pain such as vacuuming and mopping. Medications include Tylenol (mild to moderate relief).  She was last evaluated on 10/09/2022 at which time she was doing well. She was to continue with heat and home cycling along with physical therapy exercises. She was to continue with tramadol nightly as needed.  At today's visit she reports that she is experience of increasing pain in the morning in her mid back about the bra line. She would like to return to physical therapy and she states that this was very helpful in managing her pain. We discussed if her pain was to continue consideration would be made for another epidural. She states that she is taking tramadol at night and doing well with this. Denies any injury. Upon palpation today she is without tenderness to the thoracic paraspinal musculature. "  PAIN:  Are you having pain? Yes: NPRS scale: 0/10 Pain location: middle back Pain description: ache Aggravating factors: sleeping Relieving factors: tramadol   PRECAUTIONS: Fall   RED FLAGS: None   WEIGHT BEARING RESTRICTIONS: No  FALLS:  Has patient fallen in last 6 months? No  LIVING ENVIRONMENT: Lives with: lives alone and daughter lives next door. Lives in: House/apartment Stairs: Yes: External: 4 steps; on right going up and on left going up Has following equipment at home:  None, but has use cane in the past. Has shower rails, shower chair; doesn't use  OCCUPATION: retired   PLOF: Independent, Independent with basic ADLs, and Independent with gait  PATIENT GOALS: stronger. Improve mobility   NEXT MD VISIT: unsure   OBJECTIVE:  Note: Objective measures were completed at Evaluation unless otherwise  noted.  DIAGNOSTIC FINDINGS:  No recent imaging   PATIENT SURVEYS:  Modified Oswestry 28%   COGNITION: Overall cognitive status: Within functional limits for tasks assessed     SENSATION: WFL  MUSCLE LENGTH: WFL  POSTURE: rounded shoulders, forward head, and decreased lumbar lordosis  PALPATION: Noted tightness in upper thoracic paraspinal. No pain to palpation   LUMBAR ROM:   AROM eval  Flexion WFL  Extension 20  Right lateral flexion   Left lateral flexion   Right rotation minimal  Left rotation ~10 deg   Cervical  AROM eval  Flexion 45  Extension 18  Right lateral flexion 20  Left lateral flexion 12  Right rotation 50  Left rotation  40  Thoracic  AROM eval  Flexion WFL  Extension 10  Right lateral flexion 25  Left lateral flexion 20  Right rotation minimal  Left rotation ~10 deg      (Blank rows = not tested)  LOWER EXTREMITY ROM:     Grossly WFL  LOWER EXTREMITY MMT:    MMT Right eval Left eval  Hip flexion 4- 4-  Hip extension    Hip abduction 4- 4-  Hip adduction 4 4  Hip internal rotation    Hip external rotation    Knee flexion 4 4  Knee extension 4+ 4+  Ankle dorsiflexion 4 4  Ankle plantarflexion    Ankle inversion    Ankle eversion     (Blank rows = not tested)    FUNCTIONAL TESTS:  5 times sit to stand: 16  Timed up and go (TUG): 12.3 sce 6 minute walk test: 1148ft 10 meter walk test: 0.8m/s  GAIT: Distance walked: 70 Assistive device utilized: None Level of assistance: Complete Independence Comments: flexed posture.  TREATMENT DATE: 02/17/2023  Octane level 1-4 x 7 min. Improved posture noted in seat of nustep on this day. Reports RPE  4/10 upton completion and  No rest break required.   Forward reciprocal step over hedge hogs x 20 bil and UE support on rail  BLE side step over hedge hogs x 15 bil Weighted gait with 2# wrist weights and 3# AW 2x 345ft   Sit<>stand with UE swing into extension with 2# Wrist  weights.  Trunkal rotation to clap hands in horizontal ABduction x 12  Trunkal side bending with shoulders in abduction x 12 bil   Pt required intermittent therapeutic rest breaks for hydration due to BLE fatigue. PT remained attentive to PT needs and fatigue level throughout session   Throughout session, supervision assist from PT for safety.  No LOB noted improved sequencing of PD specific movements on this day.   PATIENT EDUCATION:  Education details:exercise technique, large-amplitude training/purpose, techniques for donning jacket, finger flicks Person educated: Patient Education method: IT trainer, VC Education comprehension: verbalized understanding, returned demo  HOME EXERCISE PROGRAM: Access Code: [email or web address] URL: [email or web address] Date: 02/20/2023 Prepared by: Grier Rocher  Exercises - Sit to Stand with Arm Swing  - 1 x daily - 7 x weekly - 2 sets - 10 reps - 2 hold - Seated Reach Forward, Up, and To Sides  - 1 x daily - 7 x weekly - 3 sets - 10 reps - Seated Reaching to Side and Across Body  - 1 x daily - 7 x weekly - 3 sets - 10 reps - Standing Row with Anchored Resistance  - 1 x daily - 7 x weekly - 3 sets - 10 reps  ASSESSMENT:  CLINICAL IMPRESSION: Pt put forth excellent effort towards therapeutic interventions to address PD and mid/upper strengthening.  Reduced pain pain on this day allows improved attention to posture, trunk mobility and increased amplitude movements. Performed weighted gait with emphasis on increased UE swing and engage parascapluar activation . Pt will benefit from PT to address back pain improve strength, and overall fucntional mobility   OBJECTIVE IMPAIRMENTS: Abnormal gait, cardiopulmonary status limiting activity, decreased activity tolerance, decreased balance, decreased endurance, decreased mobility, difficulty walking, decreased ROM, decreased strength, hypomobility, increased fascial restrictions, impaired perceived  functional ability, impaired flexibility, impaired UE functional use, improper body mechanics, and postural dysfunction.   ACTIVITY LIMITATIONS: carrying, squatting, sleeping, and locomotion level  PARTICIPATION LIMITATIONS: cleaning, laundry, shopping, and community activity  PERSONAL FACTORS: Age, Fitness, and 1-2 comorbidities: lumbar stenosis   are also affecting patient's functional outcome.   REHAB POTENTIAL: Good  CLINICAL DECISION MAKING: Stable/uncomplicated  EVALUATION COMPLEXITY: Low   GOALS: Goals reviewed with patient? Yes   SHORT TERM GOALS: Target date: 03/18/2023    Patient will be independent in home exercise program to improve strength/mobility for better functional independence with ADLs. Baseline: to be given at session 2.  Goal status: INITIAL   LONG TERM GOALS: Target date: 05/12/2023    Patient will improve Mod ODI score by 12.8 points  to demonstrate statistically significant improvement in mobility and quality of life.  Baseline: 28% 3/12: 26%  Goal status: INITIAL  2.  Patient (> 64 years old) will complete five times sit to stand test in < 15 seconds indicating an increased LE strength and improved balance. Baseline: 16 sec  3/12: 11.81. sec  Goal status: MET  3.  Patient will increase 6 min walk test score by >150 to demonstrated improved access to community  and improve safety.   Baseline: 1173ft 3/12: 1235ft Goal status: INITIAL  4.  Patient will increase 10 meter walk test to >1.72m/s as to improve gait speed for better community ambulation and to reduce fall risk. Baseline: 0.26m/s  3/12: 1.15m/s Goal status: MET  5.  Patient will reduce timed up and go to <11 seconds to reduce fall risk and demonstrate improved transfer/gait ability. Baseline: 13sec  3/12; 12.5sec  Goal status: IN PROGRESS  6.  Patient will reduced back pain to 3/10 at worst to indicate improved function with hold tasks.  Baseline: 9/10 at night.  3/12: 8/10,  when waking up in the morning Goal status: IN PROGRESS s PLAN:  PT FREQUENCY: 1-2x/week  PT DURATION: 12 weeks  PLANNED INTERVENTIONS: 97110-Therapeutic exercises, 97530- Therapeutic activity, 97112- Neuromuscular re-education, 97535- Self Care, 16606- Manual therapy, 97116- Gait training, 97014- Electrical stimulation (unattended), [phone]- Electrical stimulation (manual), Balance training, Stair training, Taping, Dry Needling, Joint mobilization, Joint manipulation, Spinal manipulation, Spinal mobilization, Scar mobilization, DME instructions, Cryotherapy, and Moist heat.  PLAN FOR NEXT SESSION:   Continue to address back pain and trunk mobility deficits PD treatment. Continue with balance and LE strengthening, if back pain tolerates.    Golden Pop, PT 05/01/2023, 9:09 AM

## 2023-05-05 ENCOUNTER — Ambulatory Visit: Payer: Medicare Other | Admitting: Physical Therapy

## 2023-05-05 DIAGNOSIS — G8929 Other chronic pain: Secondary | ICD-10-CM

## 2023-05-05 DIAGNOSIS — M6281 Muscle weakness (generalized): Secondary | ICD-10-CM

## 2023-05-05 DIAGNOSIS — G20A1 Parkinson's disease without dyskinesia, without mention of fluctuations: Secondary | ICD-10-CM

## 2023-05-05 DIAGNOSIS — R262 Difficulty in walking, not elsewhere classified: Secondary | ICD-10-CM

## 2023-05-05 DIAGNOSIS — R2681 Unsteadiness on feet: Secondary | ICD-10-CM

## 2023-05-05 DIAGNOSIS — R2689 Other abnormalities of gait and mobility: Secondary | ICD-10-CM

## 2023-05-05 DIAGNOSIS — R278 Other lack of coordination: Secondary | ICD-10-CM

## 2023-05-05 NOTE — Therapy (Unsigned)
 OUTPATIENT PHYSICAL THERAPY THORACOLUMBAR Treatment/ PHYSICAL THERAPY PROGRESS NOTE   Dates of reporting period  04/02/2023   to   05/05/2023     Patient Name: Katelyn Crawford MRN: [id number] DOB:July 05, 1940, 83 y.o., female Today's Date: 05/05/2023  END OF SESSION:  PT End of Session - 05/05/23 1003     Visit Number 20    Number of Visits 24    Date for PT Re-Evaluation 05/12/23    Progress Note Due on Visit 10    PT Start Time 1010    PT Stop Time 1050    PT Time Calculation (min) 40 min    Equipment Utilized During Treatment Gait belt    Activity Tolerance Patient tolerated treatment well    Behavior During Therapy WFL for tasks assessed/performed                Past Medical History:  Diagnosis Date   Anemia    Aortic atherosclerosis (HCC)    Arthritis    Cardiomegaly    Cardiomyopathy (HCC)    Cholelithiasis    Chronic kidney disease, stage 2 (mild)    Chronic sinus bradycardia    Coronary artery disease 03/10/2012   a.) LHC 03/10/2012: 40% mLAD, 50% pRI, 50% pRCA, 90% mRCA, 50% dRCA, 50% RPDA - unable to cannulate RCA - med mgmt; b.) LHC/PCI 06/25/2013: 30% pLAD, 40% mLAD, 20% mLCx, 30% RI, 30% pRCA, 70% mRCA (2.5 mm Promus DES), 40% dRCA-1, 90% dRCA-2 (2.5 mm Promus DES)   DDD (degenerative disc disease), thoracolumbar    Diverticulosis    Dizziness    Environmental allergies    Full dentures    GERD (gastroesophageal reflux disease) 06/25/2013   H/O bilateral cataract extraction 2019   History of diverticulosis    History of kidney stones    HLD (hyperlipidemia)    Hyperlipidemia, unspecified    Hypertension    Nephrolithiasis    Osteoporosis, post-menopausal    Parkinson disease (HCC)    Pre-diabetes    PVC's (premature ventricular contractions)    Scoliosis    Thoracic kyphosis    Trifascicular bundle branch block    a.) 1st degree AVB + RBBB + LAFB   Vascular disease    Past Surgical History:  Procedure Laterality Date   APPENDECTOMY      BREAST EXCISIONAL BIOPSY Left 1984   neg   BREAST EXCISIONAL BIOPSY Right 1997   lipoma   CATARACT EXTRACTION W/PHACO Left 09/02/2017   Procedure: CATARACT EXTRACTION PHACO AND INTRAOCULAR LENS PLACEMENT (IOC)  LEFT;  Surgeon: Annell Kidney, MD;  Location: [facility];  Service: Ophthalmology;  Laterality: Left;   CATARACT EXTRACTION W/PHACO Right 10/01/2017   Procedure: CATARACT EXTRACTION PHACO AND INTRAOCULAR LENS PLACEMENT (IOC) RIGHT;  Surgeon: Annell Kidney, MD;  Location: [facility];  Service: Ophthalmology;  Laterality: Right;   COLONOSCOPY WITH PROPOFOL N/A 11/27/2016   Procedure: COLONOSCOPY WITH PROPOFOL;  Surgeon: Toledo, Alphonsus Jeans, MD;  Location: [facility];  Service: Gastroenterology;  Laterality: N/A;   CORONARY ANGIOPLASTY WITH STENT PLACEMENT Left 06/25/2013   Procedure: CORONARY ANGIOPLASTY WITH STENT PLACEMENT; Location: [facility]; Surgeon: Arletta Lager, MD   KNEE ARTHROPLASTY Left 01/22/2017   Procedure: COMPUTER ASSISTED TOTAL KNEE ARTHROPLASTY;  Surgeon: Arlyne Lame, MD;  Location: [facility];  Service: Orthopedics;  Laterality: Left;   KNEE ARTHROSCOPY Left 02/01/2015   Procedure: ARTHROSCOPY KNEE, partial medial & lateral menisectomy,,medial and patelofemoral chondroplasty;  Surgeon: Arlyne Lame, MD;  Location: [facility];  Service: Orthopedics;  Laterality: Left;   LEFT HEART CATH AND CORONARY ANGIOGRAPHY Left 03/10/2012   Procedure: LEFT HEART CATH AND CORONARY ANGIOGRAPHY; Location: [facility]; Surgeon: Marcina Millard, MD   LITHOTRIPSY Right 1995   RIGHT AXILLARY LIPOMA REMOVAL     TUBAL LIGATION     VARICOSE VEIN SURGERY     Patient Active Problem List   Diagnosis Date Noted   Primary osteoarthritis of hip 02/09/2017   Primary osteoarthritis of knee 02/09/2017   Chronic sinus bradycardia 01/22/2017   Degenerative disc disease, lumbar 01/22/2017   Dizziness 01/22/2017   Nephrolithiasis 01/22/2017   Osteoporosis, post-menopausal  01/22/2017   PVC's (premature ventricular contractions) 01/22/2017   S/P total knee arthroplasty 01/22/2017   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   CKD (chronic kidney disease), 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   GERD (gastroesophageal reflux disease) 06/25/2013   Coronary artery disease involving native coronary artery of native heart 06/24/2013   Essential hypertension 06/21/2013   Mixed hyperlipidemia 06/21/2013    PCP: Marguarite Arbour, MD   REFERRING PROVIDER: Meeler, Jodelle Gross, FNP  REFERRING DIAG:  Diagnosis  [phone] (ICD-10-CM) - Lumbar stenosis with neurogenic claudication  M54.14 (ICD-10-CM) - Thoracic radiculitis    Rationale for Evaluation and Treatment: Rehabilitation  THERAPY DIAG:   Muscle weakness (generalized)  Parkinson's disease, unspecified whether dyskinesia present, unspecified whether manifestations fluctuate (HCC)  Other abnormalities of gait and mobility  Chronic bilateral thoracic back pain  Difficulty in walking, not elsewhere classified  Other lack of coordination  Unsteadiness on feet  ONSET DATE: >3 months   SUBJECTIVE:                                                                                                                                                                                           SUBJECTIVE STATEMENT:   Pt reports that she is doing pretty good today. Comes to PT from the "lab" having blood work performed. Pt states that she is unsure what blood work is being performed.    PERTINENT HISTORY:  Hx of PD and familiar to this clinic for PD management.  From recent MD appointment:  "The patient is a pleasant 83 year old female who presents today for acute on chronic bilateral low back pain without radiation to the lower extremities. She describes pain that began June 2020 without injury. She also states that she has had some form of back pain for many years. She describes her pain as mild to moderate that is sharp and  intermittent. Bending and lifting increases her pain. Rest can help alleviate her pain. She participated in physical therapy at [facility] in early 2020 with little relief. She  continues to stay active in her home and notices that household activities increase her pain such as vacuuming and mopping. Medications include Tylenol (mild to moderate relief).  She was last evaluated on 10/09/2022 at which time she was doing well. She was to continue with heat and home cycling along with physical therapy exercises. She was to continue with tramadol nightly as needed.  At today's visit she reports that she is experience of increasing pain in the morning in her mid back about the bra line. She would like to return to physical therapy and she states that this was very helpful in managing her pain. We discussed if her pain was to continue consideration would be made for another epidural. She states that she is taking tramadol at night and doing well with this. Denies any injury. Upon palpation today she is without tenderness to the thoracic paraspinal musculature. "  PAIN:  Are you having pain? Yes: NPRS scale: 0/10 Pain location: middle back Pain description: ache Aggravating factors: sleeping Relieving factors: tramadol   PRECAUTIONS: Fall   RED FLAGS: None   WEIGHT BEARING RESTRICTIONS: No  FALLS:  Has patient fallen in last 6 months? No  LIVING ENVIRONMENT: Lives with: lives alone and daughter lives next door. Lives in: House/apartment Stairs: Yes: External: 4 steps; on right going up and on left going up Has following equipment at home:  None, but has use cane in the past. Has shower rails, shower chair; doesn't use  OCCUPATION: retired   PLOF: Independent, Independent with basic ADLs, and Independent with gait  PATIENT GOALS: stronger. Improve mobility   NEXT MD VISIT: unsure   OBJECTIVE:  Note: Objective measures were completed at Evaluation unless otherwise noted.  DIAGNOSTIC  FINDINGS:  No recent imaging   PATIENT SURVEYS:  Modified Oswestry 28%   COGNITION: Overall cognitive status: Within functional limits for tasks assessed     SENSATION: WFL  MUSCLE LENGTH: WFL  POSTURE: rounded shoulders, forward head, and decreased lumbar lordosis  PALPATION: Noted tightness in upper thoracic paraspinal. No pain to palpation   LUMBAR ROM:   AROM eval  Flexion WFL  Extension 20  Right lateral flexion   Left lateral flexion   Right rotation minimal  Left rotation ~10 deg   Cervical  AROM eval  Flexion 45  Extension 18  Right lateral flexion 20  Left lateral flexion 12  Right rotation 50  Left rotation  40  Thoracic  AROM eval  Flexion WFL  Extension 10  Right lateral flexion 25  Left lateral flexion 20  Right rotation minimal  Left rotation ~10 deg      (Blank rows = not tested)  LOWER EXTREMITY ROM:     Grossly WFL  LOWER EXTREMITY MMT:    MMT Right eval Left eval  Hip flexion 4- 4-  Hip extension    Hip abduction 4- 4-  Hip adduction 4 4  Hip internal rotation    Hip external rotation    Knee flexion 4 4  Knee extension 4+ 4+  Ankle dorsiflexion 4 4  Ankle plantarflexion    Ankle inversion    Ankle eversion     (Blank rows = not tested)    FUNCTIONAL TESTS:  5 times sit to stand: 16  Timed up and go (TUG): 12.3 sce 6 minute walk test: 118ft 10 meter walk test: 0.68m/s  GAIT: Distance walked: 70 Assistive device utilized: None Level of assistance: Complete Independence Comments: flexed posture.   TREATMENT DATE:  02/17/2023  nustep level 1-4 varied resistance x 7 min. Im Reports RPE  4/10 upon completion and  No rest break required.   Standing shoulder extension x 12 YTB Standing mid row x 12 bil YTB   Seated UT stretch. Difficulty with lateral flexion to the L. 2 x 20 sec bil  Standing reciprocal foot tap on 4 inch step. X 12 bil.  Lateral foot tap on 4inch step x 10 bil Lateral foot tap on step with  Lateral reach x 10 bil  Reciprocal Reverse step over SPC in floor x x 12 bio  Standing trunk rotation with horizontal abduction to clap hands.   Staggered stance weight shift in standing x 15 bil. Moderate multimodal cues for improved posture, hip extension and symmetry in weight shift.   Trunk rotation to tap ball on target with BUE x 15 bil   Pt required intermittent therapeutic rest breaks for hydration due to BLE fatigue. PT remained attentive to PT needs and fatigue level throughout session   Throughout session, supervision assist from PT for safety.  No LOB noted improved sequencing of PD specific movements on this day.   PATIENT EDUCATION:  Education details:exercise technique, large-amplitude training/purpose, techniques for donning jacket, finger flicks Person educated: Patient Education method: IT trainer, VC Education comprehension: verbalized understanding, returned demo  HOME EXERCISE PROGRAM: Access Code: [email or web address] URL: [email or web address] Date: 02/20/2023 Prepared by: Grier Rocher  Exercises - Sit to Stand with Arm Swing  - 1 x daily - 7 x weekly - 2 sets - 10 reps - 2 hold - Seated Reach Forward, Up, and To Sides  - 1 x daily - 7 x weekly - 3 sets - 10 reps - Seated Reaching to Side and Across Body  - 1 x daily - 7 x weekly - 3 sets - 10 reps - Standing Row with Anchored Resistance  - 1 x daily - 7 x weekly - 3 sets - 10 reps  ASSESSMENT:  CLINICAL IMPRESSION: Pt put forth excellent effort towards therapeutic interventions to address PD and mid/upper back strengthening. Pt continues to report reduced back pain, allowing improved attention to posture, trunk mobility and increased amplitude movements. Pt continues to report overall reduced consistency of back pain and improved tolerance to activity. Weight shift and erect posture was extremely challenging on this day. Goal assessment deferred to next PT visit. Patient's condition has the potential  to improve in response to therapy. Maximum improvement is yet to be obtained. The anticipated improvement is attainable and reasonable in a generally predictable time. Pt will benefit from PT to address back pain improve strength, and overall fucntional mobility   OBJECTIVE IMPAIRMENTS: Abnormal gait, cardiopulmonary status limiting activity, decreased activity tolerance, decreased balance, decreased endurance, decreased mobility, difficulty walking, decreased ROM, decreased strength, hypomobility, increased fascial restrictions, impaired perceived functional ability, impaired flexibility, impaired UE functional use, improper body mechanics, and postural dysfunction.   ACTIVITY LIMITATIONS: carrying, squatting, sleeping, and locomotion level  PARTICIPATION LIMITATIONS: cleaning, laundry, shopping, and community activity  PERSONAL FACTORS: Age, Fitness, and 1-2 comorbidities: lumbar stenosis   are also affecting patient's functional outcome.   REHAB POTENTIAL: Good  CLINICAL DECISION MAKING: Stable/uncomplicated  EVALUATION COMPLEXITY: Low   GOALS: Goals reviewed with patient? Yes   SHORT TERM GOALS: Target date: 03/18/2023    Patient will be independent in home exercise program to improve strength/mobility for better functional independence with ADLs. Baseline: to be given at session 2.  Goal status: INITIAL   LONG TERM  GOALS: Target date: 05/12/2023    Patient will improve Mod ODI score by 12.8 points  to demonstrate statistically significant improvement in mobility and quality of life.  Baseline: 28% 3/12: 26%  Goal status: INITIAL  2.  Patient (> 85 years old) will complete five times sit to stand test in < 15 seconds indicating an increased LE strength and improved balance. Baseline: 16 sec  3/12: 11.81. sec  Goal status: MET  3.  Patient will increase 6 min walk test score by >150 to demonstrated improved access to community and improve safety.   Baseline:  1188ft 3/12: 1226ft Goal status: INITIAL  4.  Patient will increase 10 meter walk test to >1.81m/s as to improve gait speed for better community ambulation and to reduce fall risk. Baseline: 0.31m/s  3/12: 1.42m/s Goal status: MET  5.  Patient will reduce timed up and go to <11 seconds to reduce fall risk and demonstrate improved transfer/gait ability. Baseline: 13sec  3/12; 12.5sec  Goal status: IN PROGRESS  6.  Patient will reduced back pain to 3/10 at worst to indicate improved function with hold tasks.  Baseline: 9/10 at night.  3/12: 8/10, when waking up in the morning Goal status: IN PROGRESS s PLAN:  PT FREQUENCY: 1-2x/week  PT DURATION: 12 weeks  PLANNED INTERVENTIONS: 97110-Therapeutic exercises, 97530- Therapeutic activity, 97112- Neuromuscular re-education, 97535- Self Care, 16109- Manual therapy, 97116- Gait training, 97014- Electrical stimulation (unattended), [phone]- Electrical stimulation (manual), Balance training, Stair training, Taping, Dry Needling, Joint mobilization, Joint manipulation, Spinal manipulation, Spinal mobilization, Scar mobilization, DME instructions, Cryotherapy, and Moist heat.  PLAN FOR NEXT SESSION:   Re-assess deferred goals.   Barbara Book, PT 05/05/2023, 10:04 AM

## 2023-05-07 ENCOUNTER — Ambulatory Visit: Payer: Medicare Other | Admitting: Physical Therapy

## 2023-05-07 DIAGNOSIS — G20A1 Parkinson's disease without dyskinesia, without mention of fluctuations: Secondary | ICD-10-CM

## 2023-05-07 DIAGNOSIS — G8929 Other chronic pain: Secondary | ICD-10-CM

## 2023-05-07 DIAGNOSIS — R278 Other lack of coordination: Secondary | ICD-10-CM

## 2023-05-07 DIAGNOSIS — R262 Difficulty in walking, not elsewhere classified: Secondary | ICD-10-CM

## 2023-05-07 DIAGNOSIS — M6281 Muscle weakness (generalized): Secondary | ICD-10-CM | POA: Diagnosis not present

## 2023-05-07 DIAGNOSIS — R2689 Other abnormalities of gait and mobility: Secondary | ICD-10-CM

## 2023-05-07 DIAGNOSIS — R2681 Unsteadiness on feet: Secondary | ICD-10-CM

## 2023-05-07 NOTE — Therapy (Signed)
 OUTPATIENT PHYSICAL THERAPY THORACOLUMBAR TREATMENT    Patient Name: Katelyn Crawford MRN: [id number] DOB:1940-08-28, 83 y.o., female Today's Date: 05/07/2023   END OF SESSION:   PT End of Session - 05/07/23 1014     Visit Number 21    Number of Visits 24    Date for PT Re-Evaluation 05/12/23    Progress Note Due on Visit 10    PT Start Time 1015    PT Stop Time 1104    PT Time Calculation (min) 49 min    Equipment Utilized During Treatment Gait belt    Activity Tolerance Patient tolerated treatment well    Behavior During Therapy WFL for tasks assessed/performed                 Past Medical History:  Diagnosis Date   Anemia    Aortic atherosclerosis (HCC)    Arthritis    Cardiomegaly    Cardiomyopathy (HCC)    Cholelithiasis    Chronic kidney disease, stage 2 (mild)    Chronic sinus bradycardia    Coronary artery disease 03/10/2012   a.) LHC 03/10/2012: 40% mLAD, 50% pRI, 50% pRCA, 90% mRCA, 50% dRCA, 50% RPDA - unable to cannulate RCA - med mgmt; b.) LHC/PCI 06/25/2013: 30% pLAD, 40% mLAD, 20% mLCx, 30% RI, 30% pRCA, 70% mRCA (2.5 mm Promus DES), 40% dRCA-1, 90% dRCA-2 (2.5 mm Promus DES)   DDD (degenerative disc disease), thoracolumbar    Diverticulosis    Dizziness    Environmental allergies    Full dentures    GERD (gastroesophageal reflux disease) 06/25/2013   H/O bilateral cataract extraction 2019   History of diverticulosis    History of kidney stones    HLD (hyperlipidemia)    Hyperlipidemia, unspecified    Hypertension    Nephrolithiasis    Osteoporosis, post-menopausal    Parkinson disease (HCC)    Pre-diabetes    PVC's (premature ventricular contractions)    Scoliosis    Thoracic kyphosis    Trifascicular bundle branch block    a.) 1st degree AVB + RBBB + LAFB   Vascular disease    Past Surgical History:  Procedure Laterality Date   APPENDECTOMY     BREAST EXCISIONAL BIOPSY Left 1984   neg   BREAST EXCISIONAL BIOPSY Right 1997   lipoma    CATARACT EXTRACTION W/PHACO Left 09/02/2017   Procedure: CATARACT EXTRACTION PHACO AND INTRAOCULAR LENS PLACEMENT (IOC)  LEFT;  Surgeon: Annell Kidney, MD;  Location: [facility];  Service: Ophthalmology;  Laterality: Left;   CATARACT EXTRACTION W/PHACO Right 10/01/2017   Procedure: CATARACT EXTRACTION PHACO AND INTRAOCULAR LENS PLACEMENT (IOC) RIGHT;  Surgeon: Annell Kidney, MD;  Location: [facility];  Service: Ophthalmology;  Laterality: Right;   COLONOSCOPY WITH PROPOFOL N/A 11/27/2016   Procedure: COLONOSCOPY WITH PROPOFOL;  Surgeon: Toledo, Alphonsus Jeans, MD;  Location: [facility];  Service: Gastroenterology;  Laterality: N/A;   CORONARY ANGIOPLASTY WITH STENT PLACEMENT Left 06/25/2013   Procedure: CORONARY ANGIOPLASTY WITH STENT PLACEMENT; Location: [facility]; Surgeon: Arletta Lager, MD   KNEE ARTHROPLASTY Left 01/22/2017   Procedure: COMPUTER ASSISTED TOTAL KNEE ARTHROPLASTY;  Surgeon: Arlyne Lame, MD;  Location: [facility];  Service: Orthopedics;  Laterality: Left;   KNEE ARTHROSCOPY Left 02/01/2015   Procedure: ARTHROSCOPY KNEE, partial medial & lateral menisectomy,,medial and patelofemoral chondroplasty;  Surgeon: Arlyne Lame, MD;  Location: [facility];  Service: Orthopedics;  Laterality: Left;   LEFT HEART CATH AND CORONARY ANGIOGRAPHY Left 03/10/2012   Procedure:  LEFT HEART CATH AND CORONARY ANGIOGRAPHY; Location: [facility]; Surgeon: Marcina Millard, MD   LITHOTRIPSY Right 1995   RIGHT AXILLARY LIPOMA REMOVAL     TUBAL LIGATION     VARICOSE VEIN SURGERY     Patient Active Problem List   Diagnosis Date Noted   Primary osteoarthritis of hip 02/09/2017   Primary osteoarthritis of knee 02/09/2017   Chronic sinus bradycardia 01/22/2017   Degenerative disc disease, lumbar 01/22/2017   Dizziness 01/22/2017   Nephrolithiasis 01/22/2017   Osteoporosis, 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/22/2017   PVC's (premature ventricular contractions) 01/22/2017   S/P total knee  arthroplasty 01/22/2017   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   CKD (chronic kidney disease), 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   GERD (gastroesophageal reflux disease) 06/25/2013   Coronary artery disease involving native coronary artery of native heart 06/24/2013   Essential hypertension 06/21/2013   Mixed hyperlipidemia 06/21/2013    PCP: Marguarite Arbour, MD   REFERRING PROVIDER: Meeler, Jodelle Gross, FNP  REFERRING DIAG:  Diagnosis  [id number] (ICD-10-CM) - Lumbar stenosis with neurogenic claudication  M54.14 (ICD-10-CM) - Thoracic radiculitis    Rationale for Evaluation and Treatment: Rehabilitation  THERAPY DIAG:  Muscle weakness (generalized)  Parkinson's disease, unspecified whether dyskinesia present, unspecified whether manifestations fluctuate (HCC)  Other abnormalities of gait and mobility  Chronic bilateral thoracic back pain  Difficulty in walking, not elsewhere classified  Other lack of coordination  Unsteadiness on feet  ONSET DATE: >3 months   SUBJECTIVE:                                                                                                                                                                                           SUBJECTIVE STATEMENT:   Pt reports her pain has been pretty good the past 2 days, but this morning she was having pain all across her back between her shoulders, but states it has "eased off now" and she is not having any pain.   Reports she doesn't know what her lab tests were, but states when her daughter looked at her MyChart everything looked good.    PERTINENT HISTORY:  Hx of PD and familiar to this clinic for PD management.  From recent MD appointment:  "The patient is a pleasant 83 year old female who presents today for acute on chronic bilateral low back pain without radiation to the lower extremities. She describes pain that began June 2020 without injury. She also states that she has had some form of back pain for  many years. She describes her pain as mild to moderate that is sharp and intermittent. Bending and lifting increases her pain. Rest can help alleviate her  pain. She participated in physical therapy at [facility] clinic in early 2020 with little relief. She continues to stay active in her home and notices that household activities increase her pain such as vacuuming and mopping. Medications include Tylenol (mild to moderate relief).  She was last evaluated on 10/09/2022 at which time she was doing well. She was to continue with heat and home cycling along with physical therapy exercises. She was to continue with tramadol nightly as needed.  At today's visit she reports that she is experience of increasing pain in the morning in her mid back about the bra line. She would like to return to physical therapy and she states that this was very helpful in managing her pain. We discussed if her pain was to continue consideration would be made for another epidural. She states that she is taking tramadol at night and doing well with this. Denies any injury. Upon palpation today she is without tenderness to the thoracic paraspinal musculature. "  PAIN:  Are you having pain? Yes: NPRS scale: 0/10 Pain location: middle back Pain description: ache Aggravating factors: sleeping Relieving factors: tramadol   PRECAUTIONS: Fall   RED FLAGS: None   WEIGHT BEARING RESTRICTIONS: No  FALLS:  Has patient fallen in last 6 months? No  LIVING ENVIRONMENT: Lives with: lives alone and daughter lives next door. Lives in: House/apartment Stairs: Yes: External: 4 steps; on right going up and on left going up Has following equipment at home:  None, but has use cane in the past. Has shower rails, shower chair; doesn't use  OCCUPATION: retired   PLOF: Independent, Independent with basic ADLs, and Independent with gait  PATIENT GOALS: stronger. Improve mobility   NEXT MD VISIT: unsure   OBJECTIVE:  Note: Objective  measures were completed at Evaluation unless otherwise noted.  DIAGNOSTIC FINDINGS:  No recent imaging   PATIENT SURVEYS:  Modified Oswestry 28%   COGNITION: Overall cognitive status: Within functional limits for tasks assessed     SENSATION: WFL  MUSCLE LENGTH: WFL  POSTURE: rounded shoulders, forward head, and decreased lumbar lordosis  PALPATION: Noted tightness in upper thoracic paraspinal. No pain to palpation   LUMBAR ROM:   AROM eval  Flexion WFL  Extension 20  Right lateral flexion   Left lateral flexion   Right rotation minimal  Left rotation ~10 deg   Cervical  AROM eval  Flexion 45  Extension 18  Right lateral flexion 20  Left lateral flexion 12  Right rotation 50  Left rotation  40  Thoracic  AROM eval  Flexion WFL  Extension 10  Right lateral flexion 25  Left lateral flexion 20  Right rotation minimal  Left rotation ~10 deg      (Blank rows = not tested)  LOWER EXTREMITY ROM:     Grossly WFL  LOWER EXTREMITY MMT:    MMT Right eval Left eval  Hip flexion 4- 4-  Hip extension    Hip abduction 4- 4-  Hip adduction 4 4  Hip internal rotation    Hip external rotation    Knee flexion 4 4  Knee extension 4+ 4+  Ankle dorsiflexion 4 4  Ankle plantarflexion    Ankle inversion    Ankle eversion     (Blank rows = not tested)    FUNCTIONAL TESTS:  5 times sit to stand: 16  Timed up and go (TUG): 12.3 sce 6 minute walk test: 1179ft 10 meter walk test: 0.49m/s  GAIT: Distance walked: 70  Assistive device utilized: None Level of assistance: Complete Independence Comments: flexed posture.   TREATMENT DATE: 02/17/2023  Nustep level 1 for 3 minutes increased to level 3 for 4 minutes totaling 7 minutes and 419 steps. Reports RPE 5/10 upon completion and No rest break required.    Five times Sit to Stand Test (FTSS) "Stand up and sit down as quickly as possible 5 times, keeping your arms folded across your chest."    TIME: 11.93  seconds and 11.09 seconds with arms across chest, independently  Times > 13.6 seconds is associated with increased disability and morbidity (Guralnik, 2000) Times > 15 seconds is predictive of recurrent falls in healthy individuals aged 31 and older (Buatois, et al., 2008) Normal performance values in community dwelling individuals aged 26 and older (Bohannon, 2006): 60-69 years: 11.4 seconds 70-79 years: 12.6 seconds 80-89 years: 14.8 seconds  MCID: >= 2.3 seconds for Vestibular Disorders (Meretta, 2006)   10 Meter Walk Test: Patient instructed to walk 10 meters (32.8 ft) as quickly and as safely as possible at their normal speed Results: 1.098 m/s no AD, independently  (9.23 seconds and  8.98 seconds)  Cut off scores:   Household Ambulator  < 0.4 m/s  Limited Community Ambulator  0.4 - 0.8 m/s  [employer]  > 0.8 m/s  Increased fall risk  < 1.18m/s  Crossing a Street  >1.41m/s  MCID 0.05 m/s (small), 0.13 m/s (moderate), 0.06 m/s (significant)  (ANPTA Core Set of Outcome Measures for Adults with Neurologic Conditions, 2018)   Participated in Timed Up and Go (TUG): 1st trial: 10.85 seconds 2nd trial: 11.17 seconds  Average: 11.01 seconds no AD, independently Patient demonstrates high fall risk as indicated by requiring >13.5seconds to complete the TUG.     Modified Oswestry Low Back Pain Disability Questionnaire:  Score: 32% indicating moderate disability 0-20%: Minimal disability  21-40%: Moderate disability  41-60%: Severe disability  61-80%: Crippled  81-100%: Bed-bound or exaggerating symptoms    6 Min Walk Test:  Instructed patient to ambulate as quickly and as safely as possible for 6 minutes using LRAD. Patient was allowed to take standing rest breaks without stopping the test, but if the patient required a sitting rest break the clock would be stopped and the test would be over.  Results: 1213 feet (369 meters, Avg speed 1.025 m/s) no AD with  supervision/mod-I. Results indicate that the patient has reduced endurance with ambulation compared to age matched norms.  Age Matched Norms: 39-69 yo M: 59 F: 14, 15-79 yo M: 22 F: 471, 5-89 yo M: 417 F: 392 MDC: 58.21 meters (190.98 feet) or 50 meters (ANPTA Core Set of Outcome Measures for Adults with Neurologic Conditions, 2018)    Patient reports she would like to continue focusing on her thoracic and lumbar spine pain as well as her endurance. States her back pain is still "off and on," but did notice that she had back pain this morning when bending forward to pick-up light things. Reports she even has difficulty lifting her grocery bags, but states she doesn't want to focus on that too much at this time.     PATIENT EDUCATION:  Education details:exercise technique, large-amplitude training/purpose, techniques for donning jacket, finger flicks Person educated: Patient Education method: IT trainer, VC Education comprehension: verbalized understanding, returned demo  HOME EXERCISE PROGRAM: Access Code: [email or web address] URL: [email or web address] Date: 02/20/2023 Prepared by: Aurora Lees  Exercises - Sit to Stand with Arm Swing  - 1 x daily -  7 x weekly - 2 sets - 10 reps - 2 hold - Seated Reach Forward, Up, and To Sides  - 1 x daily - 7 x weekly - 3 sets - 10 reps - Seated Reaching to Side and Across Body  - 1 x daily - 7 x weekly - 3 sets - 10 reps - Standing Row with Anchored Resistance  - 1 x daily - 7 x weekly - 3 sets - 10 reps  ASSESSMENT:  CLINICAL IMPRESSION:  Pt arrives highly motivated to participate in therapy session. Therapy session focused on re-assessment of standardized outcome measures and subjective questionnaire to determine pt's progress with therapy thus far. Patient continues to report her pain can rise to as high as 8/10 and specifically identifies pain when trying to bend forward to pick-up light items. Pt continues to report moderate  disability on the Modified Oswestry.  Patient continues to demonstrate great B LE functional strength and good walking speed as noted on 5xSTS, TUG, and 10 meter walk test; however, based on patient's high functional mobility level feel she would benefit from further assessment of dynamic gait and more specific balance items that are often impacted by Parkinson disease, by participating in FGA and MiniBest Tests. Ms. Nobrega will benefit from continued skilled PT to address back pain improve strength, increase endurance, and improve overall fucntional mobility    OBJECTIVE IMPAIRMENTS: Abnormal gait, cardiopulmonary status limiting activity, decreased activity tolerance, decreased balance, decreased endurance, decreased mobility, difficulty walking, decreased ROM, decreased strength, hypomobility, increased fascial restrictions, impaired perceived functional ability, impaired flexibility, impaired UE functional use, improper body mechanics, and postural dysfunction.   ACTIVITY LIMITATIONS: carrying, squatting, sleeping, and locomotion level  PARTICIPATION LIMITATIONS: cleaning, laundry, shopping, and community activity  PERSONAL FACTORS: Age, Fitness, and 1-2 comorbidities: lumbar stenosis   are also affecting patient's functional outcome.   REHAB POTENTIAL: Good  CLINICAL DECISION MAKING: Stable/uncomplicated  EVALUATION COMPLEXITY: Low   GOALS: Goals reviewed with patient? Yes   SHORT TERM GOALS: Target date: 03/18/2023    Patient will be independent in home exercise program to improve strength/mobility for better functional independence with ADLs. Baseline: to be given at session 2.  05/07/2023: need to be re-assessed Goal status: INITIAL   LONG TERM GOALS: Target date: 05/12/2023    Patient will improve Mod ODI score by 12.8 points  to demonstrate statistically significant improvement in mobility and quality of life.  Baseline: 28% 3/12: 26% 05/07/2023: 32% indicating moderate  disability Goal status: IN PROGRESS  2.  Patient (> 53 years old) will complete five times sit to stand test in < 15 seconds indicating an increased LE strength and improved balance. Baseline: 16 sec  3/12: 11.81. sec  05/07/2023: 11.09 seconds Goal status: MET Upgraded Goal on 05/07/2023: Patient will increase MiniBest Test score to >18/28 to indicate a reduced risk for falling and demonstrate increased independence with functional mobility and ADLs.  Baseline: need to assess  Goal status: INITIAL  3.  Patient will increase 6 min walk test score by >150 to demonstrated improved access to community and improve safety.   Baseline: 1180ft 3/12: 1277ft 05/07/2023: 126ft Goal status: IN PROGRESS  4.  Patient will increase 10 meter walk test to >1.33m/s as to improve gait speed for better community ambulation and to reduce fall risk. Baseline: 0.30m/s  3/12: 1.4m/s 05/07/2023: 1.098 m/s no AD, independently  Goal status: MET  5.  Patient will reduce timed up and go to <11 seconds to reduce fall risk  and demonstrate improved transfer/gait ability. Baseline: 13sec  3/12; 12.5sec  05/07/2023: 11.01 seconds no AD, independently Goal status: MET Upgraded Goal on 05/07/2023: Patient will increase Functional Gait Assessment (FGA) score to >20/30 as to reduce fall risk and improve dynamic gait safety with community ambulation. Baseline: need to asess Goal Status: INITIAL   6.  Patient will reduced back pain to 3/10 at worst to indicate improved function with household tasks.  Baseline: 9/10 at night.  3/12: 8/10, when waking up in the morning 05/07/2023: continues to report back pain gets as high as 8/10 when bending forward Goal status: IN PROGRESS  PLAN:  PT FREQUENCY: 1-2x/week  PT DURATION: 12 weeks  PLANNED INTERVENTIONS: 97110-Therapeutic exercises, 97530- Therapeutic activity, 97112- Neuromuscular re-education, 97535- Self Care, 16109- Manual therapy, 97116- Gait training, 97014-  Electrical stimulation (unattended), [phone]- Electrical stimulation (manual), Balance training, Stair training, Taping, Dry Needling, Joint mobilization, Joint manipulation, Spinal manipulation, Spinal mobilization, Scar mobilization, DME instructions, Cryotherapy, and Moist heat.  PLAN FOR NEXT SESSION:  - Review and update HEP - assess FGA and MiniBest Test outcome measures when appropriate to assess higher level dynamic balance tasks given patient's high functional mobility level and Parkinson's diagnosis -- LTGs added to incorporate these outcome measures - focus on thoracic and lumbar spine pain - focus on endurance     Eisha Chatterjee, PT, DPT, NCS, CSRS Physical Therapist - [facility]  3:00 PM 05/07/23

## 2023-05-12 ENCOUNTER — Ambulatory Visit: Payer: Medicare Other | Admitting: Physical Therapy

## 2023-05-12 ENCOUNTER — Ambulatory Visit

## 2023-05-12 DIAGNOSIS — R2689 Other abnormalities of gait and mobility: Secondary | ICD-10-CM

## 2023-05-12 DIAGNOSIS — G8929 Other chronic pain: Secondary | ICD-10-CM

## 2023-05-12 DIAGNOSIS — M6281 Muscle weakness (generalized): Secondary | ICD-10-CM

## 2023-05-12 DIAGNOSIS — R2681 Unsteadiness on feet: Secondary | ICD-10-CM

## 2023-05-12 DIAGNOSIS — R262 Difficulty in walking, not elsewhere classified: Secondary | ICD-10-CM

## 2023-05-12 DIAGNOSIS — R471 Dysarthria and anarthria: Secondary | ICD-10-CM

## 2023-05-12 DIAGNOSIS — R278 Other lack of coordination: Secondary | ICD-10-CM

## 2023-05-12 DIAGNOSIS — G20A1 Parkinson's disease without dyskinesia, without mention of fluctuations: Secondary | ICD-10-CM

## 2023-05-12 NOTE — Therapy (Signed)
 OUTPATIENT PHYSICAL THERAPY THORACOLUMBAR TREATMENT/  Re-certification     Patient Name: Katelyn Crawford MRN: [id number] DOB:[birth year], 83 y.o., female Today's Date: 05/12/2023   END OF SESSION:   PT End of Session - 05/12/23 1000     Visit Number 22    Number of Visits 38    Date for PT Re-Evaluation 07/07/23    Progress Note Due on Visit 10    PT Start Time 1015    PT Stop Time 1055    PT Time Calculation (min) 40 min    Equipment Utilized During Treatment Gait belt    Activity Tolerance Patient tolerated treatment well    Behavior During Therapy WFL for tasks assessed/performed                 Past Medical History:  Diagnosis Date   Anemia    Aortic atherosclerosis (HCC)    Arthritis    Cardiomegaly    Cardiomyopathy (HCC)    Cholelithiasis    Chronic kidney disease, stage 2 (mild)    Chronic sinus bradycardia    Coronary artery disease 03/10/2012   a.) LHC 03/10/2012: 40% mLAD, 50% pRI, 50% pRCA, 90% mRCA, 50% dRCA, 50% RPDA - unable to cannulate RCA - med mgmt; b.) LHC/PCI 06/25/2013: 30% pLAD, 40% mLAD, 20% mLCx, 30% RI, 30% pRCA, 70% mRCA (2.5 mm Promus DES), 40% dRCA-1, 90% dRCA-2 (2.5 mm Promus DES)   DDD (degenerative disc disease), thoracolumbar    Diverticulosis    Dizziness    Environmental allergies    Full dentures    GERD (gastroesophageal reflux disease) 06/25/2013   H/O bilateral cataract extraction 2019   History of diverticulosis    History of kidney stones    HLD (hyperlipidemia)    Hyperlipidemia, unspecified    Hypertension    Nephrolithiasis    Osteoporosis, post-menopausal    Parkinson disease (HCC)    Pre-diabetes    PVC's (premature ventricular contractions)    Scoliosis    Thoracic kyphosis    Trifascicular bundle branch block    a.) 1st degree AVB + RBBB + LAFB   Vascular disease    Past Surgical History:  Procedure Laterality Date   APPENDECTOMY     BREAST EXCISIONAL BIOPSY Left 1984   neg   BREAST EXCISIONAL BIOPSY  Right 1997   lipoma   CATARACT EXTRACTION W/PHACO Left 09/02/2017   Procedure: CATARACT EXTRACTION PHACO AND INTRAOCULAR LENS PLACEMENT (IOC)  LEFT;  Surgeon: Annell Kidney, MD;  Location: [facility];  Service: Ophthalmology;  Laterality: Left;   CATARACT EXTRACTION W/PHACO Right 10/01/2017   Procedure: CATARACT EXTRACTION PHACO AND INTRAOCULAR LENS PLACEMENT (IOC) RIGHT;  Surgeon: Annell Kidney, MD;  Location: [facility];  Service: Ophthalmology;  Laterality: Right;   COLONOSCOPY WITH PROPOFOL  N/A 11/27/2016   Procedure: COLONOSCOPY WITH PROPOFOL ;  Surgeon: Toledo, Alphonsus Jeans, MD;  Location: [facility];  Service: Gastroenterology;  Laterality: N/A;   CORONARY ANGIOPLASTY WITH STENT PLACEMENT Left 06/25/2013   Procedure: CORONARY ANGIOPLASTY WITH STENT PLACEMENT; Location: [facility]; Surgeon: Arletta Lager, MD   KNEE ARTHROPLASTY Left 01/22/2017   Procedure: COMPUTER ASSISTED TOTAL KNEE ARTHROPLASTY;  Surgeon: Arlyne Lame, MD;  Location: [facility];  Service: Orthopedics;  Laterality: Left;   KNEE ARTHROSCOPY Left 02/01/2015   Procedure: ARTHROSCOPY KNEE, partial medial & lateral menisectomy,,medial and patelofemoral chondroplasty;  Surgeon: Arlyne Lame, MD;  Location: [facility];  Service: Orthopedics;  Laterality: Left;   LEFT HEART CATH AND CORONARY ANGIOGRAPHY Left 03/10/2012  Procedure: LEFT HEART CATH AND CORONARY ANGIOGRAPHY; Location: [facility]; Surgeon: Percival Brace, MD   LITHOTRIPSY Right 1995   RIGHT AXILLARY LIPOMA REMOVAL     TUBAL LIGATION     VARICOSE VEIN SURGERY     Patient Active Problem List   Diagnosis Date Noted   Primary osteoarthritis of hip 02/09/2017   Primary osteoarthritis of knee 02/09/2017   Chronic sinus bradycardia 01/22/2017   Degenerative disc disease, lumbar 01/22/2017   Dizziness 01/22/2017   Nephrolithiasis 01/22/2017   Osteoporosis, 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/22/2017   PVC's (premature ventricular contractions) 01/22/2017    S/P total knee arthroplasty 01/22/2017   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   CKD (chronic kidney disease), 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   GERD (gastroesophageal reflux disease) 06/25/2013   Coronary artery disease involving native coronary artery of native heart 06/24/2013   Essential hypertension 06/21/2013   Mixed hyperlipidemia 06/21/2013    PCP: Yehuda Helms, MD   REFERRING PROVIDER: Meeler, Driscilla George, FNP  REFERRING DIAG:  Diagnosis  [phone] (ICD-10-CM) - Lumbar stenosis with neurogenic claudication  M54.14 (ICD-10-CM) - Thoracic radiculitis    Rationale for Evaluation and Treatment: Rehabilitation  THERAPY DIAG:  Muscle weakness (generalized)  Other abnormalities of gait and mobility  Parkinson's disease, unspecified whether dyskinesia present, unspecified whether manifestations fluctuate (HCC)  Unsteadiness on feet  Chronic bilateral thoracic back pain  Difficulty in walking, not elsewhere classified  Other lack of coordination  ONSET DATE: >3 months   SUBJECTIVE:                                                                                                                                                                                           SUBJECTIVE STATEMENT:   Pt reports that she is doing well. No pain at start of PT session. A little pain 5-6/10 when she work up this AM, but it dissipates  with a little movement.    PERTINENT HISTORY:  Hx of PD and familiar to this clinic for PD management.  From recent MD appointment:  "The patient is a pleasant 82 year old female who presents today for acute on chronic bilateral low back pain without radiation to the lower extremities. She describes pain that began June 2020 without injury. She also states that she has had some form of back pain for many years. She describes her pain as mild to moderate that is sharp and intermittent. Bending and lifting increases her pain. Rest can help alleviate her pain.  She participated in physical therapy at [facility] clinic in early 2020 with little relief. She continues to stay active in her home and notices that household activities increase her  pain such as vacuuming and mopping. Medications include Tylenol  (mild to moderate relief).  She was last evaluated on 10/09/2022 at which time she was doing well. She was to continue with heat and home cycling along with physical therapy exercises. She was to continue with tramadol  nightly as needed.  At today's visit she reports that she is experience of increasing pain in the morning in her mid back about the bra line. She would like to return to physical therapy and she states that this was very helpful in managing her pain. We discussed if her pain was to continue consideration would be made for another epidural. She states that she is taking tramadol  at night and doing well with this. Denies any injury. Upon palpation today she is without tenderness to the thoracic paraspinal musculature. "  PAIN:  Are you having pain? Yes: NPRS scale: 0/10 Pain location: middle back Pain description: ache Aggravating factors: sleeping Relieving factors: tramadol    PRECAUTIONS: Fall   RED FLAGS: None   WEIGHT BEARING RESTRICTIONS: No  FALLS:  Has patient fallen in last 6 months? No  LIVING ENVIRONMENT: Lives with: lives alone and daughter lives next door. Lives in: House/apartment Stairs: Yes: External: 4 steps; on right going up and on left going up Has following equipment at home:  None, but has use cane in the past. Has shower rails, shower chair; doesn't use  OCCUPATION: retired   PLOF: Independent, Independent with basic ADLs, and Independent with gait  PATIENT GOALS: stronger. Improve mobility   NEXT MD VISIT: unsure   OBJECTIVE:  Note: Objective measures were completed at Evaluation unless otherwise noted.  DIAGNOSTIC FINDINGS:  No recent imaging   PATIENT SURVEYS:  Modified Oswestry 28%    COGNITION: Overall cognitive status: Within functional limits for tasks assessed     SENSATION: WFL  MUSCLE LENGTH: WFL  POSTURE: rounded shoulders, forward head, and decreased lumbar lordosis  PALPATION: Noted tightness in upper thoracic paraspinal. No pain to palpation   LUMBAR ROM:   AROM eval  Flexion WFL  Extension 20  Right lateral flexion   Left lateral flexion   Right rotation minimal  Left rotation ~10 deg   Cervical  AROM eval  Flexion 45  Extension 18  Right lateral flexion 20  Left lateral flexion 12  Right rotation 50  Left rotation  40  Thoracic  AROM eval  Flexion WFL  Extension 10  Right lateral flexion 25  Left lateral flexion 20  Right rotation minimal  Left rotation ~10 deg      (Blank rows = not tested)  LOWER EXTREMITY ROM:     Grossly WFL  LOWER EXTREMITY MMT:    MMT Right eval Left eval  Hip flexion 4- 4-  Hip extension    Hip abduction 4- 4-  Hip adduction 4 4  Hip internal rotation    Hip external rotation    Knee flexion 4 4  Knee extension 4+ 4+  Ankle dorsiflexion 4 4  Ankle plantarflexion    Ankle inversion    Ankle eversion     (Blank rows = not tested)    FUNCTIONAL TESTS:  5 times sit to stand: 16  Timed up and go (TUG): 12.3 sce 6 minute walk test: 1163ft 10 meter walk test: 0.62m/s  GAIT: Distance walked: 70 Assistive device utilized: None Level of assistance: Complete Independence Comments: flexed posture.   TREATMENT DATE: 02/17/2023   MiniBEST Assesment- Balance Evaluation Systems Test  Sit to Stand __2__  Instruction: "Cross your arms across your chest. Try not to use your hands unless you must. Do not let your legs lean against the back of the chair when you stand. Please stand up now." (2) Normal: Comes to stand without use of hands and stabilizes independently. (1) Moderate: Comes to stand WITH use of hands on first attempt. (0) Severe: Unable to stand up from chair without  assistance, OR needs several attempts with use of hands.   2. Rise to toes __1___ Instruction: "Place your feet shoulder width apart. Place your hands on your hips. Try to rise as high as you can onto your toes. I will count out loud to 3 seconds. Try to hold this pose for at least 3 seconds. Look straight ahead. Rise now." (2) Normal: Stable for 3 s with maximum height. (1) Moderate: Heels up, but not full range (smaller than when holding hands), OR noticeable instability for 3 s. (0) Severe: < 3 s  3. Stand on 1 Leg ___1___ Instruction: "Look straight ahead. Keep your hands on your hips. Lift your leg off of the ground behind you without touching or resting your raised leg upon your other standing leg. Stay standing on one leg as long as you can. Look straight ahead. Lift now." Right: Time in Seconds Trial 1:__1___Trial 2:_2____ Left: Time in Seconds Trial 1:__2___Trial 2:___4__ (2) Normal: 20 s. (1) Moderate: < 20 s. (0) Severe: Unable.  To score each side separately use the trial with the longest time. To calculate the sub-score and total score use the side [left or right] with the lowest numerical score [i.e. the worse side].    4. Compensatory stepping correction-forward  ___1___ Instruction: "Stand with your feet shoulder width apart, arms at your sides. Lean forward against my hands beyond your forward limits. When I let go, do whatever is necessary, including taking a step, to avoid a fall." (2) Normal: Recovers independently with a single, large step (second realignment step is allowed). (1) Moderate: More than one step used to recover equilibrium. (0) Severe: No step, OR would fall if not caught, OR falls spontaneously.   5. Compensatory stepping correction- backward ___1____ Instruction: "Stand with your feet shoulder width apart, arms at your sides. Lean backward against my hands beyond your backward limits. When I let go, do whatever is necessary, including taking a  step, to avoid a fall."  (2) Normal: Recovers independently with a single, large step (second realignment step is allowed). (1) Moderate: More than one step used to recover equilibrium. (0) Severe: No step, OR would fall if not caught, OR falls spontaneously.   6. Compensatory stepping correction- lateral   Right   1  Left:     1 Instruction: "Stand with your feet together, arms down at your sides. Lean into my hand beyond your sideways limit. When I let go, do whatever is necessary, including taking a step, to avoid a fall."     7. Stance (feet together) eyes open, firm surface ___2____ Instruction: "Place your hands on your hips. Place your feet together until almost touching. Look straight ahead. Be as stable and still as possible, until I say stop."  Time in seconds:________  (2) Normal: 30 s.  (1) Moderate: < 30 s.  (0) Severe: Unable.  8. Stance (feet together; eyes closed foam surface ___2____ Instruction: "Step onto the foam. Place your hands on your hips. Place your feet together until almost touching. Be as stable and still as possible, until I say stop.  I will start timing when you close your eyes."  Time in seconds:________  (2) Normal: 30 s.  (1) Moderate: < 30 s.  (0) Severe: Unable.   9. Incline- eyes closed ___0____ Instruction: "Step onto the incline ramp. Please stand on the incline ramp with your toes toward the top. Place your feet shoulder width apart and have your arms down at your sides. I will start timing when you close your eyes."  Time in seconds:________  (2) Normal: Stands independently 30 s and aligns with gravity.  (1) Moderate: Stands independently <30 s OR aligns with surface.  (0) Severe: Unable.   10. Change in gait speed ____1____ Instruction: "Begin walking at your normal speed, when I tell you 'fast', walk as fast as you can. When I say 'slow', walk very slowly." (2) Normal: Significantly changes walking speed without imbalance. (1)  Moderate: Unable to change walking speed or signs of imbalance. (0) Severe: Unable to achieve significant change in walking speed AND signs of imbalance.   11. Walk with head turns- horizontal ____2____ Instruction: "Begin walking at your normal speed, when I say "right", turn your head and look to the right. When I say "left" turn your head and look to the left. Try to keep yourself walking in a straight line." (2) Normal: performs head turns with no change in gait speed and good balance. (1) Moderate: performs head turns with reduction in gait speed. (0) Severe: performs head turns with imbalance.  12. Walk with pivot turns _____2_____ Instruction: "Begin walking at your normal speed. When I tell you to 'turn and stop', turn as quickly as you can, face the opposite direction, and stop. After the turn, your feet should be close together." (2) Normal: Turns with feet close FAST (< 3 steps) with good balance. (1) Moderate: Turns with feet close SLOW (>4 steps) with good balance. (0) Severe: Cannot turn with feet close at any speed without imbalance.   13. Step over obstacles ____1_____ Instruction: "Begin walking at your normal speed. When you get to the box, step over it, not around it and keep walking." (2) Normal: Able to step over box with minimal change of gait speed and with good balance. (1) Moderate: Steps over box but touches box OR displays cautious behavior by slowing gait. (0) Severe: Unable to step over box OR steps around box  14. Timed up and go 3 meter walk (TUG) & TUG with dual task __1____  Instruction TUG: "When I say 'Go', stand up from chair, walk at your normal speed across the tape on the floor, turn around, and come back to sit in the chair." Instruction TUG with Dual Task: "Count backwards by threes starting at ___. When I say 'Go', stand up from chair, walk at your normal speed across the tape on the floor, turn around, and come back to sit in the chair. Continue  counting backwards the entire time."  TUG: ___13.27 seconds; Dual Task TUG: ______22.18_seconds (2) Normal: No noticeable change in sitting, standing or walking while backward counting when compared to TUG without Dual Task. (1) Moderate: Dual Task affects either counting OR walking (>10%) when compared to the TUG without Dual Task. (0) Severe: Stops counting while walking OR stops walking while counting.  SCORE 19/28  Patient demonstrates increased fall risk as noted by score of 21/30 on  Functional Gait Assessment.   <22/30 = predictive of falls, <20/30 = fall in 6 months, <18/30 = predictive of falls in PD MCID: 5  points stroke population, 4 points geriatric population (ANPTA Core Set of Outcome Measures for Adults with Neurologic Conditions, 2018)  [facility] PT Assessment - 05/12/23 0001       Functional Gait  Assessment   Gait Level Surface Walks 20 ft in less than 5.5 sec, no assistive devices, good speed, no evidence for imbalance, normal gait pattern, deviates no more than 6 in outside of the 12 in walkway width.    Change in Gait Speed Able to change speed, demonstrates mild gait deviations, deviates 6-10 in outside of the 12 in walkway width, or no gait deviations, unable to achieve a major change in velocity, or uses a change in velocity, or uses an assistive device.    Gait with Horizontal Head Turns Performs head turns smoothly with no change in gait. Deviates no more than 6 in outside 12 in walkway width    Gait with Vertical Head Turns Performs task with slight change in gait velocity (eg, minor disruption to smooth gait path), deviates 6 - 10 in outside 12 in walkway width or uses assistive device    Gait and Pivot Turn Pivot turns safely within 3 sec and stops quickly with no loss of balance.    Step Over Obstacle Is able to step over one shoe box (4.5 in total height) but must slow down and adjust steps to clear box safely. May require verbal cueing.    Gait with Narrow Base of  Support Ambulates 4-7 steps.    Gait with Eyes Closed Walks 20 ft, no assistive devices, good speed, no evidence of imbalance, normal gait pattern, deviates no more than 6 in outside 12 in walkway width. Ambulates 20 ft in less than 7 sec.    Ambulating Backwards Walks 20 ft, slow speed, abnormal gait pattern, evidence for imbalance, deviates 10-15 in outside 12 in walkway width.    Steps Alternating feet, must use rail.    Total Score 21              PATIENT EDUCATION:  Education details:exercise technique, large-amplitude training/purpose, techniques for donning jacket, finger flicks Person educated: Patient Education method: IT trainer, VC Education comprehension: verbalized understanding, returned demo  HOME EXERCISE PROGRAM: Access Code: [email or web address] URL: [email or web address] Date: 02/20/2023 Prepared by: Aurora Lees  Exercises - Sit to Stand with Arm Swing  - 1 x daily - 7 x weekly - 2 sets - 10 reps - 2 hold - Seated Reach Forward, Up, and To Sides  - 1 x daily - 7 x weekly - 3 sets - 10 reps - Seated Reaching to Side and Across Body  - 1 x daily - 7 x weekly - 3 sets - 10 reps - Standing Row with Anchored Resistance  - 1 x daily - 7 x weekly - 3 sets - 10 reps  ASSESSMENT:  CLINICAL IMPRESSION:  Pt arrives highly motivated to participate in therapy session. PT assessed high level balance through minibest and FGA. Noted to have increased fall risk from decreased score on FGA of 21 and MiniBest 19/28. Greatest difficulty with standing on angled surface and stepping strategy to correct LOB on minibest.  Back pain continues to be problematic in the morning, but overall has improved. Ms. Conchas will benefit from continued skilled PT to address back pain improve strength, increase endurance, and improve overall fucntional mobility    OBJECTIVE IMPAIRMENTS: Abnormal gait, cardiopulmonary status limiting activity, decreased activity tolerance, decreased balance,  decreased endurance, decreased mobility, difficulty walking, decreased ROM, decreased  strength, hypomobility, increased fascial restrictions, impaired perceived functional ability, impaired flexibility, impaired UE functional use, improper body mechanics, and postural dysfunction.   ACTIVITY LIMITATIONS: carrying, squatting, sleeping, and locomotion level  PARTICIPATION LIMITATIONS: cleaning, laundry, shopping, and community activity  PERSONAL FACTORS: Age, Fitness, and 1-2 comorbidities: lumbar stenosis   are also affecting patient's functional outcome.   REHAB POTENTIAL: Good  CLINICAL DECISION MAKING: Stable/uncomplicated  EVALUATION COMPLEXITY: Low   GOALS: Goals reviewed with patient? Yes   SHORT TERM GOALS: Target date: 06/09/2023    Patient will be independent in home exercise program to improve strength/mobility for better functional independence with ADLs. Baseline: to be given at session 2.  05/07/2023: need to be re-assessed Goal status: INITIAL   LONG TERM GOALS: Target date: 07/07/2023      Patient will improve Mod ODI score by 12.8 points  to demonstrate statistically significant improvement in mobility and quality of life.  Baseline: 28% 3/12: 26% 05/07/2023: 32% indicating moderate disability Goal status: IN PROGRESS  2.  Patient (> 48 years old) will complete five times sit to stand test in < 15 seconds indicating an increased LE strength and improved balance. Baseline: 16 sec  3/12: 11.81. sec  05/07/2023: 11.09 seconds Goal status: MET Upgraded Goal on 05/07/2023: Patient will increase MiniBest Test score to >18/28 to indicate a reduced risk for falling and demonstrate increased independence with functional mobility and ADLs.  Baseline: need to assess  Goal status: INITIAL  3.  Patient will increase 6 min walk test score by >150 to demonstrated improved access to community and improve safety.   Baseline: 1168ft 3/12: 1271ft 05/07/2023: 1243ft Goal  status: IN PROGRESS  4.  Patient will increase 10 meter walk test to >1.19m/s as to improve gait speed for better community ambulation and to reduce fall risk. Baseline: 0.40m/s  3/12: 1.71m/s 05/07/2023: 1.098 m/s no AD, independently  Goal status: MET  5.  Patient will reduce timed up and go to <11 seconds to reduce fall risk and demonstrate improved transfer/gait ability. Baseline: 13sec  3/12; 12.5sec  05/07/2023: 11.01 seconds no AD, independently Goal status: MET Upgraded Goal on 05/07/2023: Patient will increase Functional Gait Assessment (FGA) score to >20/30 as to reduce fall risk and improve dynamic gait safety with community ambulation. Baseline: need to asess Goal Status: INITIAL   6.  Patient will reduced back pain to 3/10 at worst to indicate improved function with household tasks.  Baseline: 9/10 at night.  3/12: 8/10, when waking up in the morning 05/07/2023: continues to report back pain gets as high as 8/10 when bending forward Goal status: IN PROGRESS  PLAN:  PT FREQUENCY: 1-2x/week  PT DURATION: 12 weeks  PLANNED INTERVENTIONS: 97110-Therapeutic exercises, 97530- Therapeutic activity, 97112- Neuromuscular re-education, 97535- Self Care, 82956- Manual therapy, 97116- Gait training, 97014- Electrical stimulation (unattended), [phone]- Electrical stimulation (manual), Balance training, Stair training, Taping, Dry Needling, Joint mobilization, Joint manipulation, Spinal manipulation, Spinal mobilization, Scar mobilization, DME instructions, Cryotherapy, and Moist heat.  PLAN FOR NEXT SESSION:  - Review and update HEP - focus on thoracic and lumbar spine pain - focus on endurance  Continue to address PD deficits.    Aurora Lees PT, DPT  Physical Therapist - [place]  [facility]  2:27 PM 05/12/23

## 2023-05-12 NOTE — Therapy (Signed)
 OUTPATIENT SPEECH LANGUAGE PATHOLOGY PARKINSON'S EVALUATION   Patient Name: Katelyn Crawford MRN: [id number] DOB:02-19-1940, 83 y.o., female Today's Date: 05/12/2023  PCP: Shary Deems, MD REFERRING PROVIDER: Devora Folks, MD   End of Session - 05/12/23 1157     Visit Number 1    Number of Visits 24    Date for SLP Re-Evaluation 08/04/23    SLP Start Time 1100    SLP Stop Time  1150    SLP Time Calculation (min) 50 min             Past Medical History:  Diagnosis Date   Anemia    Aortic atherosclerosis (HCC)    Arthritis    Cardiomegaly    Cardiomyopathy (HCC)    Cholelithiasis    Chronic kidney disease, stage 2 (mild)    Chronic sinus bradycardia    Coronary artery disease 03/10/2012   a.) LHC 03/10/2012: 40% mLAD, 50% pRI, 50% pRCA, 90% mRCA, 50% dRCA, 50% RPDA - unable to cannulate RCA - med mgmt; b.) LHC/PCI 06/25/2013: 30% pLAD, 40% mLAD, 20% mLCx, 30% RI, 30% pRCA, 70% mRCA (2.5 mm Promus DES), 40% dRCA-1, 90% dRCA-2 (2.5 mm Promus DES)   DDD (degenerative disc disease), thoracolumbar    Diverticulosis    Dizziness    Environmental allergies    Full dentures    GERD (gastroesophageal reflux disease) 06/25/2013   H/O bilateral cataract extraction 2019   History of diverticulosis    History of kidney stones    HLD (hyperlipidemia)    Hyperlipidemia, unspecified    Hypertension    Nephrolithiasis    Osteoporosis, post-menopausal    Parkinson disease (HCC)    Pre-diabetes    PVC's (premature ventricular contractions)    Scoliosis    Thoracic kyphosis    Trifascicular bundle branch block    a.) 1st degree AVB + RBBB + LAFB   Vascular disease    Past Surgical History:  Procedure Laterality Date   APPENDECTOMY     BREAST EXCISIONAL BIOPSY Left 1984   neg   BREAST EXCISIONAL BIOPSY Right 1997   lipoma   CATARACT EXTRACTION W/PHACO Left 09/02/2017   Procedure: CATARACT EXTRACTION PHACO AND INTRAOCULAR LENS PLACEMENT (IOC)  LEFT;  Surgeon: Annell Kidney, MD;  Location: [facility];  Service: Ophthalmology;  Laterality: Left;   CATARACT EXTRACTION W/PHACO Right 10/01/2017   Procedure: CATARACT EXTRACTION PHACO AND INTRAOCULAR LENS PLACEMENT (IOC) RIGHT;  Surgeon: Annell Kidney, MD;  Location: [facility];  Service: Ophthalmology;  Laterality: Right;   COLONOSCOPY WITH PROPOFOL  N/A 11/27/2016   Procedure: COLONOSCOPY WITH PROPOFOL ;  Surgeon: Toledo, Alphonsus Jeans, MD;  Location: [facility];  Service: Gastroenterology;  Laterality: N/A;   CORONARY ANGIOPLASTY WITH STENT PLACEMENT Left 06/25/2013   Procedure: CORONARY ANGIOPLASTY WITH STENT PLACEMENT; Location: [facility]; Surgeon: Arletta Lager, MD   KNEE ARTHROPLASTY Left 01/22/2017   Procedure: COMPUTER ASSISTED TOTAL KNEE ARTHROPLASTY;  Surgeon: Arlyne Lame, MD;  Location: [facility];  Service: Orthopedics;  Laterality: Left;   KNEE ARTHROSCOPY Left 02/01/2015   Procedure: ARTHROSCOPY KNEE, partial medial & lateral menisectomy,,medial and patelofemoral chondroplasty;  Surgeon: Arlyne Lame, MD;  Location: [facility];  Service: Orthopedics;  Laterality: Left;   LEFT HEART CATH AND CORONARY ANGIOGRAPHY Left 03/10/2012   Procedure: LEFT HEART CATH AND CORONARY ANGIOGRAPHY; Location: [facility]; Surgeon: Percival Brace, MD   LITHOTRIPSY Right 1995   RIGHT AXILLARY LIPOMA REMOVAL     TUBAL LIGATION     VARICOSE VEIN SURGERY  Patient Active Problem List   Diagnosis Date Noted   Primary osteoarthritis of hip 02/09/2017   Primary osteoarthritis of knee 02/09/2017   Chronic sinus bradycardia 01/22/2017   Degenerative disc disease, lumbar 01/22/2017   Dizziness 01/22/2017   Nephrolithiasis 01/22/2017   Osteoporosis, 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/22/2017   PVC's (premature ventricular contractions) 01/22/2017   S/P total knee arthroplasty 01/22/2017   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   CKD (chronic kidney disease), 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   GERD (gastroesophageal reflux  disease) 06/25/2013   Coronary artery disease involving native coronary artery of native heart 06/24/2013   Essential hypertension 06/21/2013   Mixed hyperlipidemia 06/21/2013    ONSET DATE: 05/08/23 referral date, dx with PD in 2019  REFERRING DIAG: Parkinson's Disease  THERAPY DIAG:  Dysarthria and anarthria  Rationale for Evaluation and Treatment Rehabilitation  SUBJECTIVE:   SUBJECTIVE STATEMENT: Pt alert, pleasant, and cooperative.  Pt accompanied by: self  PERTINENT HISTORY: Pt is an 83 y.o. 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 Pt with PMHx GERD, HTN, CAD, and osteoathritis. Per chart review, pt previously participated in LSVT in 2023. Noted, MBS on 04/12/20 with findings of mild oral dysphagia. Regular diet and thin liquids was recommended at that time.   DIAGNOSTIC FINDINGS: MRI brain, 10/26/22, "No acute finding by MRI. Moderate chronic small-vessel ischemic changes of the cerebral hemispheric white matter, which is considerably progressive when compared to the study of 2019."  PAIN:  Are you having pain? No  FALLS: Has patient fallen in last 6 months?  See PT evaluation for details  LIVING ENVIRONMENT: Lives with: lives alone; daughter lives next door   PLOF:  Level of assistance: Independent with ADLs Employment: Retired  PATIENT GOALS    for family to hear me  OBJECTIVE:  COGNITIVE COMMUNICATION Overall cognitive status: Within functional limits for tasks assessed and concern for mild cognitive impairment per most recent neurology note; further assessment warranted    MOTOR SPEECH: Overall motor speech: impaired Level of impairment: Word, Phrase, Sentence, and Conversation Respiration: clavicular breathing Phonation: hoarse and low vocal intensity Resonance: WFL Articulation: Appears intact Intelligibility: WFL   ORAL MOTOR EXAMINATION WFL; reports  hypersalivation and drooling; not appreciated this date; sore on lower L lip and sores on : lateral surface of tonguee  OBJECTIVE VOICE ASSESSMENT: Sustained "ah" maximum phonation time:  seconds Sustained "ah" loudness average: 5.3 dB Average fundamental frequency during sustained "ah":212 Hz   (Greater than 1 SD below average of  244 Hz +/- 27 for gender)  Oral reading (passage) loudness average: 74 dB Conversational pitch average: 152 Hz Conversational pitch range: 104-230 Hz Conversational loudness average: 74 dB Conversational loudness range: 65-89 dB Highest dynamic pitch during ascending pitch glide: 320 Lowest dynamic pitch during descending pitch glide: 174 Voice quality: hoarse, rough, and strained Stimulability trials: Given SLP modeling and consistent min cues, loudness average increased to 82 dB at sentence/paragraph level.    Patient and/or family report difficulty swallowing which warrants further GI evaluation. No ST indicated. Pt with reports of globus sensation with pills. Previously, pt had declined further GI work up, but it agreeable to it now.   PATIENT REPORTED OUTCOME MEASURES (PROM):  The Communication Effectiveness Survey is a patient-reported outcome measure in which the patient rates their own effectiveness in different communication situations. A higher score indicates greater effectiveness.   Pt's self-rating was 12/32.   Having a conversation with a family member or friends at  home. 2 Participating in conversation with strangers in a quiet place. 2 Conversing with a familiar person over the telephone. 2 Conversing with a stranger over the telephone. 1- Not at all effective Being part of a conversation in a noisy environment (social gathering). 1- Not at all effective Speaking to a friend when you are emotionally upset or you are angry. 2 Having a conversation while traveling in a car. 1- Not at all effective Having a conversation with someone at a  distance (across a room). 1- Not at all effective  TODAY'S TREATMENT:  Pt made aware of role of SLP, changes to voice/speech in PD, benefits of intensity based ST as well as need for maintenance activities after d/c, results of assessment, and SLP. Pt also made aware of ST (vs GI) role in swallowing given pt's complaints of globus sensation with pills. Noted pt offered GI consultation previously and declined, pt now open to further GI work up.    PATIENT EDUCATION: Education details: as above Person educated: Patient Education method: Explanation Education comprehension: verbalized understanding and needs further education   HOME EXERCISE PROGRAM: To be provided in upcoming sessions   GOALS: Goals reviewed with patient? Yes  SHORT TERM GOALS: Target date: 10 sessions  Pt will participate in further cognitive-linguistic assessment with additional goals, as appropriate. Baseline: Goal status: INITIAL  2.  The patient will complete Daily Tasks (Maximum duration "ah", High/Lows, and Functional Phrases) at average loudness >/= 80 dB and with loud, good quality voice with min cues.  Baseline:  Goal status: INITIAL  3.  The patient will complete Hierarchal Speech Loudness reading drills (words/phrases, sentences) at average >/= 75 dB and with loud, good quality voice with min cues.  Baseline:  Goal status: INITIAL  4.  The patient will participate in 5-8 minutes conversation, maintaining average loudness of 75 dB and good quality voice with modified independence.  Baseline:  Goal status: INITIAL    LONG TERM GOALS: Target date: 12 weeks  The patient will complete Daily Tasks (Maximum duration "ah", High/Lows, and Functional Phrases) at average loudness >/= 80 dB and with loud, good quality voice.  Baseline:  Goal status: INITIAL  2.  The patient will complete Hierarchal Speech Loudness reading drills (words/phrases, sentences, and paragraph) at average >/= 75 dB and with loud,  good quality voice.  Baseline:  Goal status: INITIAL  3.  The patient will participate in 20 minutes conversation, maintaining average loudness of >/= 75 dB and loud, good quality voice.  Baseline:  Goal status: INITIAL  4.  The patient will report improved communication effectiveness as measured by the Communication Effectiveness Survey.  Baseline: 05/12/23: 12/32 Goal status: INITIAL    ASSESSMENT:  CLINICAL IMPRESSION: Pt is an 83 y.o. 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 Pt with PMHx GERD, HTN, CAD, and osteoathritis. Per chart review, pt previously participated in LSVT in 2023. Assessment completed via informal means and PROM. Based on today's assessment, pt presents with with mild- hypokinetic dysarthria characterized by hoarse, gravelly vocal quality, reduced pitch variation and reduced vocal intensity. Patient reports requests to repeat herself from friends and family, and has noticed a decline in her vocal quality over several years. Patient did exhibit some strain during phonation tasks with stimulability testing, but was stimulable for improved loudness, pitch variation and vocal quality in reading and conversation using model and cues for "Loud, like your talking to a crowd" Recommend course of  intensity based ST to maximize pt's vocal quality and intelligibility for conversations with family and friends to reduce social isolation and improve quality of life.  OBJECTIVE IMPAIRMENTS include dysarthria. These impairments are limiting patient from effectively communicating at home and in community. Factors affecting potential to achieve goals and functional outcome are co-morbidities. Patient will benefit from skilled SLP services to address above impairments and improve overall function.  REHAB POTENTIAL: Good  PLAN: SLP FREQUENCY: 2x/week  SLP DURATION: 12 weeks  PLANNED INTERVENTIONS: Cueing  hierachy, Internal/external aids, Functional tasks, SLP instruction and feedback, Compensatory strategies, and Patient/family education   Dia Forget, M.S., CCC-SLP Speech-Language Pathologist [facility] [phone])    [facility] at [facility] [address] [place], [place], [address] Phone: [phone]   Fax:  [phone]

## 2023-05-14 ENCOUNTER — Ambulatory Visit: Payer: Medicare Other | Admitting: Physical Therapy

## 2023-05-14 DIAGNOSIS — R2681 Unsteadiness on feet: Secondary | ICD-10-CM

## 2023-05-14 DIAGNOSIS — M6281 Muscle weakness (generalized): Secondary | ICD-10-CM

## 2023-05-14 DIAGNOSIS — R262 Difficulty in walking, not elsewhere classified: Secondary | ICD-10-CM

## 2023-05-14 DIAGNOSIS — R2689 Other abnormalities of gait and mobility: Secondary | ICD-10-CM

## 2023-05-14 DIAGNOSIS — G8929 Other chronic pain: Secondary | ICD-10-CM

## 2023-05-14 DIAGNOSIS — G20A1 Parkinson's disease without dyskinesia, without mention of fluctuations: Secondary | ICD-10-CM

## 2023-05-14 DIAGNOSIS — R278 Other lack of coordination: Secondary | ICD-10-CM

## 2023-05-14 NOTE — Therapy (Signed)
 OUTPATIENT PHYSICAL THERAPY THORACOLUMBAR TREATMENT/  Re-certification     Patient Name: Katelyn Crawford MRN: [id number] DOB:19-Mar-1940, 83 y.o., female Today's Date: 05/14/2023   END OF SESSION:   PT End of Session - 05/14/23 1018     Visit Number 23    Number of Visits 38    Date for PT Re-Evaluation 07/07/23    Progress Note Due on Visit 10    PT Start Time 1016    PT Stop Time 1055    PT Time Calculation (min) 39 min    Equipment Utilized During Treatment Gait belt    Activity Tolerance Patient tolerated treatment well    Behavior During Therapy WFL for tasks assessed/performed                 Past Medical History:  Diagnosis Date   Anemia    Aortic atherosclerosis (HCC)    Arthritis    Cardiomegaly    Cardiomyopathy (HCC)    Cholelithiasis    Chronic kidney disease, stage 2 (mild)    Chronic sinus bradycardia    Coronary artery disease 03/10/2012   a.) LHC 03/10/2012: 40% mLAD, 50% pRI, 50% pRCA, 90% mRCA, 50% dRCA, 50% RPDA - unable to cannulate RCA - med mgmt; b.) LHC/PCI 06/25/2013: 30% pLAD, 40% mLAD, 20% mLCx, 30% RI, 30% pRCA, 70% mRCA (2.5 mm Promus DES), 40% dRCA-1, 90% dRCA-2 (2.5 mm Promus DES)   DDD (degenerative disc disease), thoracolumbar    Diverticulosis    Dizziness    Environmental allergies    Full dentures    GERD (gastroesophageal reflux disease) 06/25/2013   H/O bilateral cataract extraction 2019   History of diverticulosis    History of kidney stones    HLD (hyperlipidemia)    Hyperlipidemia, unspecified    Hypertension    Nephrolithiasis    Osteoporosis, post-menopausal    Parkinson disease (HCC)    Pre-diabetes    PVC's (premature ventricular contractions)    Scoliosis    Thoracic kyphosis    Trifascicular bundle branch block    a.) 1st degree AVB + RBBB + LAFB   Vascular disease    Past Surgical History:  Procedure Laterality Date   APPENDECTOMY     BREAST EXCISIONAL BIOPSY Left 1984   neg   BREAST EXCISIONAL BIOPSY  Right 1997   lipoma   CATARACT EXTRACTION W/PHACO Left 09/02/2017   Procedure: CATARACT EXTRACTION PHACO AND INTRAOCULAR LENS PLACEMENT (IOC)  LEFT;  Surgeon: Annell Kidney, MD;  Location: [facility];  Service: Ophthalmology;  Laterality: Left;   CATARACT EXTRACTION W/PHACO Right 10/01/2017   Procedure: CATARACT EXTRACTION PHACO AND INTRAOCULAR LENS PLACEMENT (IOC) RIGHT;  Surgeon: Annell Kidney, MD;  Location: [facility];  Service: Ophthalmology;  Laterality: Right;   COLONOSCOPY WITH PROPOFOL  N/A 11/27/2016   Procedure: COLONOSCOPY WITH PROPOFOL ;  Surgeon: Toledo, Alphonsus Jeans, MD;  Location: [facility];  Service: Gastroenterology;  Laterality: N/A;   CORONARY ANGIOPLASTY WITH STENT PLACEMENT Left 06/25/2013   Procedure: CORONARY ANGIOPLASTY WITH STENT PLACEMENT; Location: [facility]; Surgeon: Arletta Lager, MD   KNEE ARTHROPLASTY Left 01/22/2017   Procedure: COMPUTER ASSISTED TOTAL KNEE ARTHROPLASTY;  Surgeon: Arlyne Lame, MD;  Location: [facility];  Service: Orthopedics;  Laterality: Left;   KNEE ARTHROSCOPY Left 02/01/2015   Procedure: ARTHROSCOPY KNEE, partial medial & lateral menisectomy,,medial and patelofemoral chondroplasty;  Surgeon: Arlyne Lame, MD;  Location: [facility];  Service: Orthopedics;  Laterality: Left;   LEFT HEART CATH AND CORONARY ANGIOGRAPHY Left 03/10/2012  Procedure: LEFT HEART CATH AND CORONARY ANGIOGRAPHY; Location: [facility]; Surgeon: Percival Brace, MD   LITHOTRIPSY Right 1995   RIGHT AXILLARY LIPOMA REMOVAL     TUBAL LIGATION     VARICOSE VEIN SURGERY     Patient Active Problem List   Diagnosis Date Noted   Primary osteoarthritis of hip 02/09/2017   Primary osteoarthritis of knee 02/09/2017   Chronic sinus bradycardia 01/22/2017   Degenerative disc disease, lumbar 01/22/2017   Dizziness 01/22/2017   Nephrolithiasis 01/22/2017   Osteoporosis, 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/22/2017   PVC's (premature ventricular contractions) 01/22/2017    S/P total knee arthroplasty 01/22/2017   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   CKD (chronic kidney disease), 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   GERD (gastroesophageal reflux disease) 06/25/2013   Coronary artery disease involving native coronary artery of native heart 06/24/2013   Essential hypertension 06/21/2013   Mixed hyperlipidemia 06/21/2013    PCP: Yehuda Helms, MD   REFERRING PROVIDER: Meeler, Driscilla George, FNP  REFERRING DIAG:  Diagnosis  [phone] (ICD-10-CM) - Lumbar stenosis with neurogenic claudication  M54.14 (ICD-10-CM) - Thoracic radiculitis    Rationale for Evaluation and Treatment: Rehabilitation  THERAPY DIAG:  Muscle weakness (generalized)  Other abnormalities of gait and mobility  Parkinson's disease, unspecified whether dyskinesia present, unspecified whether manifestations fluctuate (HCC)  Unsteadiness on feet  Chronic bilateral thoracic back pain  Difficulty in walking, not elsewhere classified  Other lack of coordination  ONSET DATE: >3 months   SUBJECTIVE:                                                                                                                                                                                           SUBJECTIVE STATEMENT:   Pt reports that she is doing well. No pain at start of PT session. A little pain 5-6/10 when she work up this AM, it has reduced to 4/10 .   PERTINENT HISTORY:  Hx of PD and familiar to this clinic for PD management.  From recent MD appointment:  "The patient is a pleasant 83 year old female who presents today for acute on chronic bilateral low back pain without radiation to the lower extremities. She describes pain that began June 2020 without injury. She also states that she has had some form of back pain for many years. She describes her pain as mild to moderate that is sharp and intermittent. Bending and lifting increases her pain. Rest can help alleviate her pain. She participated in  physical therapy at [facility] in early 2020 with little relief. She continues to stay active in her home and notices that household activities increase her pain such as  vacuuming and mopping. Medications include Tylenol  (mild to moderate relief).  She was last evaluated on 10/09/2022 at which time she was doing well. She was to continue with heat and home cycling along with physical therapy exercises. She was to continue with tramadol  nightly as needed.  At today's visit she reports that she is experience of increasing pain in the morning in her mid back about the bra line. She would like to return to physical therapy and she states that this was very helpful in managing her pain. We discussed if her pain was to continue consideration would be made for another epidural. She states that she is taking tramadol  at night and doing well with this. Denies any injury. Upon palpation today she is without tenderness to the thoracic paraspinal musculature. "  PAIN:  Are you having pain? Yes: NPRS scale: 0/10 Pain location: middle back Pain description: ache Aggravating factors: sleeping Relieving factors: tramadol    PRECAUTIONS: Fall   RED FLAGS: None   WEIGHT BEARING RESTRICTIONS: No  FALLS:  Has patient fallen in last 6 months? No  LIVING ENVIRONMENT: Lives with: lives alone and daughter lives next door. Lives in: House/apartment Stairs: Yes: External: 4 steps; on right going up and on left going up Has following equipment at home:  None, but has use cane in the past. Has shower rails, shower chair; doesn't use  OCCUPATION: retired   PLOF: Independent, Independent with basic ADLs, and Independent with gait  PATIENT GOALS: stronger. Improve mobility   NEXT MD VISIT: unsure   OBJECTIVE:  Note: Objective measures were completed at Evaluation unless otherwise noted.  DIAGNOSTIC FINDINGS:  No recent imaging   PATIENT SURVEYS:  Modified Oswestry 28%   COGNITION: Overall  cognitive status: Within functional limits for tasks assessed     SENSATION: WFL  MUSCLE LENGTH: WFL  POSTURE: rounded shoulders, forward head, and decreased lumbar lordosis  PALPATION: Noted tightness in upper thoracic paraspinal. No pain to palpation   LUMBAR ROM:   AROM eval  Flexion WFL  Extension 20  Right lateral flexion   Left lateral flexion   Right rotation minimal  Left rotation ~10 deg   Cervical  AROM eval  Flexion 45  Extension 18  Right lateral flexion 20  Left lateral flexion 12  Right rotation 50  Left rotation  40  Thoracic  AROM eval  Flexion WFL  Extension 10  Right lateral flexion 25  Left lateral flexion 20  Right rotation minimal  Left rotation ~10 deg      (Blank rows = not tested)  LOWER EXTREMITY ROM:     Grossly WFL  LOWER EXTREMITY MMT:    MMT Right eval Left eval  Hip flexion 4- 4-  Hip extension    Hip abduction 4- 4-  Hip adduction 4 4  Hip internal rotation    Hip external rotation    Knee flexion 4 4  Knee extension 4+ 4+  Ankle dorsiflexion 4 4  Ankle plantarflexion    Ankle inversion    Ankle eversion     (Blank rows = not tested)    FUNCTIONAL TESTS:  5 times sit to stand: 16  Timed up and go (TUG): 12.3 sce 6 minute walk test: 1165ft 10 meter walk test: 0.55m/s Functional gait assessment: 21   4/21: miniBest: 19/28   GAIT: Distance walked: 70 Assistive device utilized: None Level of assistance: Complete Independence Comments: flexed posture.   TREATMENT DATE: 05/14/2023  TE Nustep level 1-4 x  cues for posture, trunk rotation, and full BLE ROM. Performed with interval resistance   Standing shoulder extension TYB 2 x 12  Seated low 2 x 12 YTB.  Standing Trunk rotation to clap contralateral UE 2 x 10  Cues for reduced shoulder shrug throughout TE to decrease UT activation and allow improved LT recruitment.   TA  Foot tap on 5inch step x 15 with cues for increased cadence. Performed  without UE support  Lateral step up/over x 10 bil with BUE support on rail.   Forward reverse gait with no AD 2 x 10 ft.  Cues for improved posture and step length intermittently.   Manual  Seated STM to mid and UT x 8 min with multiple TP release.  Seated UT stretch 2 x 25 sec bil   Pt reports decreased "burning" upper back and shoulders upon completion of interventions.    PATIENT EDUCATION:  Education details:exercise technique, large-amplitude training/purpose, techniques for donning jacket, finger flicks Person educated: Patient Education method: IT trainer, VC Education comprehension: verbalized understanding, returned demo  HOME EXERCISE PROGRAM: Access Code: [email or web address] URL: [email or web address] Date: 02/20/2023 Prepared by: Aurora Lees  Exercises - Sit to Stand with Arm Swing  - 1 x daily - 7 x weekly - 2 sets - 10 reps - 2 hold - Seated Reach Forward, Up, and To Sides  - 1 x daily - 7 x weekly - 3 sets - 10 reps - Seated Reaching to Side and Across Body  - 1 x daily - 7 x weekly - 3 sets - 10 reps - Standing Row with Anchored Resistance  - 1 x daily - 7 x weekly - 3 sets - 10 reps  ASSESSMENT:  CLINICAL IMPRESSION:  Pt arrives highly motivated to participate in therapy session. PT treatment continued to address postural dysfunction from PD and improved management of back pain  with strengthening of parascapular movement as well STM to reduce pain and improve muscle extensibility in hyperactive Upper traps.  will benefit from continued skilled PT to address back pain improve strength, increase endurance, and improve overall fucntional mobility    OBJECTIVE IMPAIRMENTS: Abnormal gait, cardiopulmonary status limiting activity, decreased activity tolerance, decreased balance, decreased endurance, decreased mobility, difficulty walking, decreased ROM, decreased strength, hypomobility, increased fascial restrictions, impaired perceived functional ability,  impaired flexibility, impaired UE functional use, improper body mechanics, and postural dysfunction.   ACTIVITY LIMITATIONS: carrying, squatting, sleeping, and locomotion level  PARTICIPATION LIMITATIONS: cleaning, laundry, shopping, and community activity  PERSONAL FACTORS: Age, Fitness, and 1-2 comorbidities: lumbar stenosis   are also affecting patient's functional outcome.   REHAB POTENTIAL: Good  CLINICAL DECISION MAKING: Stable/uncomplicated  EVALUATION COMPLEXITY: Low   GOALS: Goals reviewed with patient? Yes   SHORT TERM GOALS: Target date: 06/09/2023    Patient will be independent in home exercise program to improve strength/mobility for better functional independence with ADLs. Baseline: to be given at session 2.  05/07/2023: need to be re-assessed Goal status: INITIAL   LONG TERM GOALS: Target date: 07/07/2023      Patient will improve Mod ODI score by 12.8 points  to demonstrate statistically significant improvement in mobility and quality of life.  Baseline: 28% 3/12: 26% 05/07/2023: 32% indicating moderate disability Goal status: IN PROGRESS  2.  Patient (> 36 years old) will complete five times sit to stand test in < 15 seconds indicating an increased LE strength and improved balance. Baseline: 16 sec  3/12: 11.81. sec  05/07/2023: 11.09 seconds Goal  status: MET Upgraded Goal on 05/07/2023: Patient will increase MiniBest Test score to >18/28 to indicate a reduced risk for falling and demonstrate increased independence with functional mobility and ADLs.  Baseline: need to assess  Goal status: INITIAL  3.  Patient will increase 6 min walk test score by >150 to demonstrated improved access to community and improve safety.   Baseline: 1129ft 3/12: 1271ft 05/07/2023: 1232ft Goal status: IN PROGRESS  4.  Patient will increase 10 meter walk test to >1.76m/s as to improve gait speed for better community ambulation and to reduce fall risk. Baseline: 0.33m/s   3/12: 1.43m/s 05/07/2023: 1.098 m/s no AD, independently  Goal status: MET  5.  Patient will reduce timed up and go to <11 seconds to reduce fall risk and demonstrate improved transfer/gait ability. Baseline: 13sec  3/12; 12.5sec  05/07/2023: 11.01 seconds no AD, independently Goal status: MET Upgraded Goal on 05/07/2023: Patient will increase Functional Gait Assessment (FGA) score to >20/30 as to reduce fall risk and improve dynamic gait safety with community ambulation. Baseline: need to asess Goal Status: INITIAL   6.  Patient will reduced back pain to 3/10 at worst to indicate improved function with household tasks.  Baseline: 9/10 at night.  3/12: 8/10, when waking up in the morning 05/07/2023: continues to report back pain gets as high as 8/10 when bending forward Goal status: IN PROGRESS  PLAN:  PT FREQUENCY: 1-2x/week  PT DURATION: 12 weeks  PLANNED INTERVENTIONS: 97110-Therapeutic exercises, 97530- Therapeutic activity, 97112- Neuromuscular re-education, 97535- Self Care, 64403- Manual therapy, 97116- Gait training, 97014- Electrical stimulation (unattended), [phone]- Electrical stimulation (manual), Balance training, Stair training, Taping, Dry Needling, Joint mobilization, Joint manipulation, Spinal manipulation, Spinal mobilization, Scar mobilization, DME instructions, Cryotherapy, and Moist heat.  PLAN FOR NEXT SESSION:   - focus on thoracic and lumbar spine pain - focus on endurance  Continue to address PD deficits.    Aurora Lees PT, DPT  Physical Therapist - [facility]  10:19 AM 05/14/23

## 2023-05-19 ENCOUNTER — Ambulatory Visit: Payer: Medicare Other | Admitting: Physical Therapy

## 2023-05-19 DIAGNOSIS — M6281 Muscle weakness (generalized): Secondary | ICD-10-CM

## 2023-05-19 DIAGNOSIS — G20A1 Parkinson's disease without dyskinesia, without mention of fluctuations: Secondary | ICD-10-CM

## 2023-05-19 DIAGNOSIS — G8929 Other chronic pain: Secondary | ICD-10-CM

## 2023-05-19 DIAGNOSIS — R262 Difficulty in walking, not elsewhere classified: Secondary | ICD-10-CM

## 2023-05-19 DIAGNOSIS — R2689 Other abnormalities of gait and mobility: Secondary | ICD-10-CM

## 2023-05-19 DIAGNOSIS — R2681 Unsteadiness on feet: Secondary | ICD-10-CM

## 2023-05-19 DIAGNOSIS — R278 Other lack of coordination: Secondary | ICD-10-CM

## 2023-05-19 NOTE — Therapy (Signed)
 OUTPATIENT PHYSICAL THERAPY THORACOLUMBAR TREATMENT/  Re-certification     Patient Name: Katelyn Crawford MRN: [id number] DOB:May 22, 1940, 83 y.o., female Today's Date: 05/19/2023   END OF SESSION:   PT End of Session - 05/19/23 0939     Visit Number 24    Number of Visits 38    Date for PT Re-Evaluation 07/07/23    Progress Note Due on Visit 10    PT Start Time 1015    PT Stop Time 1055    PT Time Calculation (min) 40 min    Equipment Utilized During Treatment Gait belt    Activity Tolerance Patient tolerated treatment well    Behavior During Therapy WFL for tasks assessed/performed                 Past Medical History:  Diagnosis Date   Anemia    Aortic atherosclerosis (HCC)    Arthritis    Cardiomegaly    Cardiomyopathy (HCC)    Cholelithiasis    Chronic kidney disease, stage 2 (mild)    Chronic sinus bradycardia    Coronary artery disease 03/10/2012   a.) LHC 03/10/2012: 40% mLAD, 50% pRI, 50% pRCA, 90% mRCA, 50% dRCA, 50% RPDA - unable to cannulate RCA - med mgmt; b.) LHC/PCI 06/25/2013: 30% pLAD, 40% mLAD, 20% mLCx, 30% RI, 30% pRCA, 70% mRCA (2.5 mm Promus DES), 40% dRCA-1, 90% dRCA-2 (2.5 mm Promus DES)   DDD (degenerative disc disease), thoracolumbar    Diverticulosis    Dizziness    Environmental allergies    Full dentures    GERD (gastroesophageal reflux disease) 06/25/2013   H/O bilateral cataract extraction 2019   History of diverticulosis    History of kidney stones    HLD (hyperlipidemia)    Hyperlipidemia, unspecified    Hypertension    Nephrolithiasis    Osteoporosis, post-menopausal    Parkinson disease (HCC)    Pre-diabetes    PVC's (premature ventricular contractions)    Scoliosis    Thoracic kyphosis    Trifascicular bundle branch block    a.) 1st degree AVB + RBBB + LAFB   Vascular disease    Past Surgical History:  Procedure Laterality Date   APPENDECTOMY     BREAST EXCISIONAL BIOPSY Left 1984   neg   BREAST EXCISIONAL BIOPSY  Right 1997   lipoma   CATARACT EXTRACTION W/PHACO Left 09/02/2017   Procedure: CATARACT EXTRACTION PHACO AND INTRAOCULAR LENS PLACEMENT (IOC)  LEFT;  Surgeon: Annell Kidney, MD;  Location: [facility];  Service: Ophthalmology;  Laterality: Left;   CATARACT EXTRACTION W/PHACO Right 10/01/2017   Procedure: CATARACT EXTRACTION PHACO AND INTRAOCULAR LENS PLACEMENT (IOC) RIGHT;  Surgeon: Annell Kidney, MD;  Location: [facility];  Service: Ophthalmology;  Laterality: Right;   COLONOSCOPY WITH PROPOFOL  N/A 11/27/2016   Procedure: COLONOSCOPY WITH PROPOFOL ;  Surgeon: Toledo, Alphonsus Jeans, MD;  Location: [facility];  Service: Gastroenterology;  Laterality: N/A;   CORONARY ANGIOPLASTY WITH STENT PLACEMENT Left 06/25/2013   Procedure: CORONARY ANGIOPLASTY WITH STENT PLACEMENT; Location: [facility]; Surgeon: Arletta Lager, MD   KNEE ARTHROPLASTY Left 01/22/2017   Procedure: COMPUTER ASSISTED TOTAL KNEE ARTHROPLASTY;  Surgeon: Arlyne Lame, MD;  Location: [facility];  Service: Orthopedics;  Laterality: Left;   KNEE ARTHROSCOPY Left 02/01/2015   Procedure: ARTHROSCOPY KNEE, partial medial & lateral menisectomy,,medial and patelofemoral chondroplasty;  Surgeon: Arlyne Lame, MD;  Location: [facility];  Service: Orthopedics;  Laterality: Left;   LEFT HEART CATH AND CORONARY ANGIOGRAPHY Left 03/10/2012  Procedure: LEFT HEART CATH AND CORONARY ANGIOGRAPHY; Location: [facility]; Surgeon: Percival Brace, MD   LITHOTRIPSY Right 1995   RIGHT AXILLARY LIPOMA REMOVAL     TUBAL LIGATION     VARICOSE VEIN SURGERY     Patient Active Problem List   Diagnosis Date Noted   Primary osteoarthritis of hip 02/09/2017   Primary osteoarthritis of knee 02/09/2017   Chronic sinus bradycardia 01/22/2017   Degenerative disc disease, lumbar 01/22/2017   Dizziness 01/22/2017   Nephrolithiasis 01/22/2017   Osteoporosis, 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/22/2017   PVC's (premature ventricular contractions) 01/22/2017    S/P total knee arthroplasty 01/22/2017   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   CKD (chronic kidney disease), 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   GERD (gastroesophageal reflux disease) 06/25/2013   Coronary artery disease involving native coronary artery of native heart 06/24/2013   Essential hypertension 06/21/2013   Mixed hyperlipidemia 06/21/2013    PCP: Yehuda Helms, MD   REFERRING PROVIDER: Meeler, Driscilla George, FNP  REFERRING DIAG:  Diagnosis  [phone] (ICD-10-CM) - Lumbar stenosis with neurogenic claudication  M54.14 (ICD-10-CM) - Thoracic radiculitis    Rationale for Evaluation and Treatment: Rehabilitation  THERAPY DIAG:  Muscle weakness (generalized)  Other abnormalities of gait and mobility  Parkinson's disease, unspecified whether dyskinesia present, unspecified whether manifestations fluctuate (HCC)  Unsteadiness on feet  Difficulty in walking, not elsewhere classified  Chronic bilateral thoracic back pain  Other lack of coordination  ONSET DATE: >3 months   SUBJECTIVE:                                                                                                                                                                                           SUBJECTIVE STATEMENT:   Pt reports that she is doing well. No pain at start of PT session. But had a little pain this morning. Very busy weekend that was emotionally challenging due to death of relative last week, and funeral over the weekend.    PERTINENT HISTORY:  Hx of PD and familiar to this clinic for PD management.  From recent MD appointment:  "The patient is a pleasant 83 year old female who presents today for acute on chronic bilateral low back pain without radiation to the lower extremities. She describes pain that began June 2020 without injury. She also states that she has had some form of back pain for many years. She describes her pain as mild to moderate that is sharp and intermittent. Bending and  lifting increases her pain. Rest can help alleviate her pain. She participated in physical therapy at [facility] in early 2020 with little relief. She continues to stay active in her  home and notices that household activities increase her pain such as vacuuming and mopping. Medications include Tylenol  (mild to moderate relief).  She was last evaluated on 10/09/2022 at which time she was doing well. She was to continue with heat and home cycling along with physical therapy exercises. She was to continue with tramadol  nightly as needed.  At today's visit she reports that she is experience of increasing pain in the morning in her mid back about the bra line. She would like to return to physical therapy and she states that this was very helpful in managing her pain. We discussed if her pain was to continue consideration would be made for another epidural. She states that she is taking tramadol  at night and doing well with this. Denies any injury. Upon palpation today she is without tenderness to the thoracic paraspinal musculature. "  PAIN:  Are you having pain? Yes: NPRS scale: 0/10 Pain location: middle back Pain description: ache Aggravating factors: sleeping Relieving factors: tramadol    PRECAUTIONS: Fall   RED FLAGS: None   WEIGHT BEARING RESTRICTIONS: No  FALLS:  Has patient fallen in last 6 months? No  LIVING ENVIRONMENT: Lives with: lives alone and daughter lives next door. Lives in: House/apartment Stairs: Yes: External: 4 steps; on right going up and on left going up Has following equipment at home:  None, but has use cane in the past. Has shower rails, shower chair; doesn't use  OCCUPATION: retired   PLOF: Independent, Independent with basic ADLs, and Independent with gait  PATIENT GOALS: stronger. Improve mobility   NEXT MD VISIT: unsure   OBJECTIVE:  Note: Objective measures were completed at Evaluation unless otherwise noted.  DIAGNOSTIC FINDINGS:  No recent  imaging   PATIENT SURVEYS:  Modified Oswestry 28%   COGNITION: Overall cognitive status: Within functional limits for tasks assessed     SENSATION: WFL  MUSCLE LENGTH: WFL  POSTURE: rounded shoulders, forward head, and decreased lumbar lordosis  PALPATION: Noted tightness in upper thoracic paraspinal. No pain to palpation   LUMBAR ROM:   AROM eval  Flexion WFL  Extension 20  Right lateral flexion   Left lateral flexion   Right rotation minimal  Left rotation ~10 deg   Cervical  AROM eval  Flexion 45  Extension 18  Right lateral flexion 20  Left lateral flexion 12  Right rotation 50  Left rotation  40  Thoracic  AROM eval  Flexion WFL  Extension 10  Right lateral flexion 25  Left lateral flexion 20  Right rotation minimal  Left rotation ~10 deg      (Blank rows = not tested)  LOWER EXTREMITY ROM:     Grossly WFL  LOWER EXTREMITY MMT:    MMT Right eval Left eval  Hip flexion 4- 4-  Hip extension    Hip abduction 4- 4-  Hip adduction 4 4  Hip internal rotation    Hip external rotation    Knee flexion 4 4  Knee extension 4+ 4+  Ankle dorsiflexion 4 4  Ankle plantarflexion    Ankle inversion    Ankle eversion     (Blank rows = not tested)    FUNCTIONAL TESTS:  5 times sit to stand: 16  Timed up and go (TUG): 12.3 sce 6 minute walk test: 1155ft 10 meter walk test: 0.36m/s Functional gait assessment: 21   4/21: miniBest: 19/28   GAIT: Distance walked: 70 Assistive device utilized: None Level of assistance: Complete Independence Comments: flexed posture.  TREATMENT DATE: 05/19/2023  TE  Nustep level 1-4 x cues for posture, trunk rotation, and full BLE ROM. Performed with interval resistance 1.5 min warm up and 1.5 min cool down Forward lunge with 2.5# AW to 2nd step on stairs with BUE support on rails for pec stretch.  Standing single limb reciprocal shoulder extension YTB x 12  Standing single limb reciprocal mid row YTB x 12    TA  Sit<>stand with UE swing into shoulder extension  Standing trunk rotation to clap hands at 90 deg abduction 2 x 10 bil  Side step with lateral reach x 10 bil  Forward step with BUE forward reach x 10 bil  Suitcase cary 9# DB x 58ft with BUE  Seated shoulder horz abd with palms up 2 x 10  Forward/reverse gait with emphasis on shoulder swing 60ft x 5.  Weighted gait with 2.5# AW x 134ft with improved arm swing noted.     Supervision assist throughout session with moderate cues for posture intermittently. Pt noted to have improved sequencing of mutli-step movements as well  improved scapular mobility and static hold of extension through tspine.    PATIENT EDUCATION:  Education details:exercise technique, large-amplitude training/purpose, techniques for donning jacket, finger flicks Person educated: Patient Education method: IT trainer, VC Education comprehension: verbalized understanding, returned demo  HOME EXERCISE PROGRAM: Access Code: [email or web address] URL: [email or web address] Date: 02/20/2023 Prepared by: Aurora Lees  Exercises - Sit to Stand with Arm Swing  - 1 x daily - 7 x weekly - 2 sets - 10 reps - 2 hold - Seated Reach Forward, Up, and To Sides  - 1 x daily - 7 x weekly - 3 sets - 10 reps - Seated Reaching to Side and Across Body  - 1 x daily - 7 x weekly - 3 sets - 10 reps - Standing Row with Anchored Resistance  - 1 x daily - 7 x weekly - 3 sets - 10 reps  ASSESSMENT:  CLINICAL IMPRESSION:  Pt arrives highly motivated to participate in therapy session. PT treatment continued to address postural dysfunction from PD and improved management of back pain. Pt noted to have improved ROm in Bil shoulders and improved sequencing of multi-step movements. Pt also noted to  have improved trunk extension with cues from PT on this day reducing forward head posture. Pt  will benefit from continued skilled PT to address back pain improve strength, increase  endurance, and improve overall fucntional mobility    OBJECTIVE IMPAIRMENTS: Abnormal gait, cardiopulmonary status limiting activity, decreased activity tolerance, decreased balance, decreased endurance, decreased mobility, difficulty walking, decreased ROM, decreased strength, hypomobility, increased fascial restrictions, impaired perceived functional ability, impaired flexibility, impaired UE functional use, improper body mechanics, and postural dysfunction.   ACTIVITY LIMITATIONS: carrying, squatting, sleeping, and locomotion level  PARTICIPATION LIMITATIONS: cleaning, laundry, shopping, and community activity  PERSONAL FACTORS: Age, Fitness, and 1-2 comorbidities: lumbar stenosis   are also affecting patient's functional outcome.   REHAB POTENTIAL: Good  CLINICAL DECISION MAKING: Stable/uncomplicated  EVALUATION COMPLEXITY: Low   GOALS: Goals reviewed with patient? Yes   SHORT TERM GOALS: Target date: 06/09/2023    Patient will be independent in home exercise program to improve strength/mobility for better functional independence with ADLs. Baseline: to be given at session 2.  05/07/2023: need to be re-assessed Goal status: INITIAL   LONG TERM GOALS: Target date: 07/07/2023      Patient will improve Mod ODI score by 12.8 points  to demonstrate statistically significant  improvement in mobility and quality of life.  Baseline: 28% 3/12: 26% 05/07/2023: 32% indicating moderate disability Goal status: IN PROGRESS  2.  Patient (> 50 years old) will complete five times sit to stand test in < 15 seconds indicating an increased LE strength and improved balance. Baseline: 16 sec  3/12: 11.81. sec  05/07/2023: 11.09 seconds Goal status: MET Upgraded Goal on 05/07/2023: Patient will increase MiniBest Test score to >18/28 to indicate a reduced risk for falling and demonstrate increased independence with functional mobility and ADLs.  Baseline: need to assess  Goal status:  INITIAL  3.  Patient will increase 6 min walk test score by >150 to demonstrated improved access to community and improve safety.   Baseline: 1119ft 3/12: 1247ft 05/07/2023: 1257ft Goal status: IN PROGRESS  4.  Patient will increase 10 meter walk test to >1.50m/s as to improve gait speed for better community ambulation and to reduce fall risk. Baseline: 0.11m/s  3/12: 1.72m/s 05/07/2023: 1.098 m/s no AD, independently  Goal status: MET  5.  Patient will reduce timed up and go to <11 seconds to reduce fall risk and demonstrate improved transfer/gait ability. Baseline: 13sec  3/12; 12.5sec  05/07/2023: 11.01 seconds no AD, independently Goal status: MET Upgraded Goal on 05/07/2023: Patient will increase Functional Gait Assessment (FGA) score to >20/30 as to reduce fall risk and improve dynamic gait safety with community ambulation. Baseline: need to asess Goal Status: INITIAL   6.  Patient will reduced back pain to 3/10 at worst to indicate improved function with household tasks.  Baseline: 9/10 at night.  3/12: 8/10, when waking up in the morning 05/07/2023: continues to report back pain gets as high as 8/10 when bending forward Goal status: IN PROGRESS  PLAN:  PT FREQUENCY: 1-2x/week  PT DURATION: 12 weeks  PLANNED INTERVENTIONS: 97110-Therapeutic exercises, 97530- Therapeutic activity, 97112- Neuromuscular re-education, 97535- Self Care, 16109- Manual therapy, 97116- Gait training, 97014- Electrical stimulation (unattended), [phone]- Electrical stimulation (manual), Balance training, Stair training, Taping, Dry Needling, Joint mobilization, Joint manipulation, Spinal manipulation, Spinal mobilization, Scar mobilization, DME instructions, Cryotherapy, and Moist heat.  PLAN FOR NEXT SESSION:   Re-assess and expand HEP.  Continue to address PD deficits. As well as upper back pain.    Aurora Lees PT, DPT  Physical Therapist - [place]  [facility]  9:43  AM 05/19/23

## 2023-05-21 ENCOUNTER — Ambulatory Visit: Payer: Medicare Other | Admitting: Physical Therapy

## 2023-05-21 DIAGNOSIS — M6281 Muscle weakness (generalized): Secondary | ICD-10-CM

## 2023-05-21 DIAGNOSIS — R2689 Other abnormalities of gait and mobility: Secondary | ICD-10-CM

## 2023-05-21 DIAGNOSIS — R278 Other lack of coordination: Secondary | ICD-10-CM

## 2023-05-21 DIAGNOSIS — R2681 Unsteadiness on feet: Secondary | ICD-10-CM

## 2023-05-21 DIAGNOSIS — G20A1 Parkinson's disease without dyskinesia, without mention of fluctuations: Secondary | ICD-10-CM

## 2023-05-21 DIAGNOSIS — R262 Difficulty in walking, not elsewhere classified: Secondary | ICD-10-CM

## 2023-05-21 DIAGNOSIS — G8929 Other chronic pain: Secondary | ICD-10-CM

## 2023-05-21 NOTE — Therapy (Signed)
 OUTPATIENT PHYSICAL THERAPY THORACOLUMBAR TREATMENT  Patient Name: Katelyn Crawford MRN: [id number] DOB:05/26/1940, 83 y.o., female Today's Date: 05/21/2023   END OF SESSION:   PT End of Session - 05/21/23 1021     Visit Number 25    Number of Visits 38    Date for PT Re-Evaluation 07/07/23    PT Start Time 1018    PT Stop Time 1058    PT Time Calculation (min) 40 min    Equipment Utilized During Treatment Gait belt    Activity Tolerance Patient tolerated treatment well    Behavior During Therapy WFL for tasks assessed/performed                 Past Medical History:  Diagnosis Date   Anemia    Aortic atherosclerosis (HCC)    Arthritis    Cardiomegaly    Cardiomyopathy (HCC)    Cholelithiasis    Chronic kidney disease, stage 2 (mild)    Chronic sinus bradycardia    Coronary artery disease 03/10/2012   a.) LHC 03/10/2012: 40% mLAD, 50% pRI, 50% pRCA, 90% mRCA, 50% dRCA, 50% RPDA - unable to cannulate RCA - med mgmt; b.) LHC/PCI 06/25/2013: 30% pLAD, 40% mLAD, 20% mLCx, 30% RI, 30% pRCA, 70% mRCA (2.5 mm Promus DES), 40% dRCA-1, 90% dRCA-2 (2.5 mm Promus DES)   DDD (degenerative disc disease), thoracolumbar    Diverticulosis    Dizziness    Environmental allergies    Full dentures    GERD (gastroesophageal reflux disease) 06/25/2013   H/O bilateral cataract extraction 2019   History of diverticulosis    History of kidney stones    HLD (hyperlipidemia)    Hyperlipidemia, unspecified    Hypertension    Nephrolithiasis    Osteoporosis, post-menopausal    Parkinson disease (HCC)    Pre-diabetes    PVC's (premature ventricular contractions)    Scoliosis    Thoracic kyphosis    Trifascicular bundle branch block    a.) 1st degree AVB + RBBB + LAFB   Vascular disease    Past Surgical History:  Procedure Laterality Date   APPENDECTOMY     BREAST EXCISIONAL BIOPSY Left 1984   neg   BREAST EXCISIONAL BIOPSY Right 1997   lipoma   CATARACT EXTRACTION W/PHACO Left  09/02/2017   Procedure: CATARACT EXTRACTION PHACO AND INTRAOCULAR LENS PLACEMENT (IOC)  LEFT;  Surgeon: Annell Kidney, MD;  Location: [facility];  Service: Ophthalmology;  Laterality: Left;   CATARACT EXTRACTION W/PHACO Right 10/01/2017   Procedure: CATARACT EXTRACTION PHACO AND INTRAOCULAR LENS PLACEMENT (IOC) RIGHT;  Surgeon: Annell Kidney, MD;  Location: [facility];  Service: Ophthalmology;  Laterality: Right;   COLONOSCOPY WITH PROPOFOL  N/A 11/27/2016   Procedure: COLONOSCOPY WITH PROPOFOL ;  Surgeon: Toledo, Alphonsus Jeans, MD;  Location: [facility];  Service: Gastroenterology;  Laterality: N/A;   CORONARY ANGIOPLASTY WITH STENT PLACEMENT Left 06/25/2013   Procedure: CORONARY ANGIOPLASTY WITH STENT PLACEMENT; Location: [facility]; Surgeon: Arletta Lager, MD   KNEE ARTHROPLASTY Left 01/22/2017   Procedure: COMPUTER ASSISTED TOTAL KNEE ARTHROPLASTY;  Surgeon: Arlyne Lame, MD;  Location: [facility];  Service: Orthopedics;  Laterality: Left;   KNEE ARTHROSCOPY Left 02/01/2015   Procedure: ARTHROSCOPY KNEE, partial medial & lateral menisectomy,,medial and patelofemoral chondroplasty;  Surgeon: Arlyne Lame, MD;  Location: [facility];  Service: Orthopedics;  Laterality: Left;   LEFT HEART CATH AND CORONARY ANGIOGRAPHY Left 03/10/2012   Procedure: LEFT HEART CATH AND CORONARY ANGIOGRAPHY; Location: [facility]; Surgeon: Percival Brace,  MD   LITHOTRIPSY Right 1995   RIGHT AXILLARY LIPOMA REMOVAL     TUBAL LIGATION     VARICOSE VEIN SURGERY     Patient Active Problem List   Diagnosis Date Noted   Primary osteoarthritis of hip 02/09/2017   Primary osteoarthritis of knee 02/09/2017   Chronic sinus bradycardia 01/22/2017   Degenerative disc disease, lumbar 01/22/2017   Dizziness 01/22/2017   Nephrolithiasis 01/22/2017   Osteoporosis, 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/22/2017   PVC's (premature ventricular contractions) 01/22/2017   S/P total knee arthroplasty 01/22/2017   Trifascicular  bundle branch block 03/02/2015   CKD (chronic kidney disease), 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   GERD (gastroesophageal reflux disease) 06/25/2013   Coronary artery disease involving native coronary artery of native heart 06/24/2013   Essential hypertension 06/21/2013   Mixed hyperlipidemia 06/21/2013    PCP: Yehuda Helms, MD   REFERRING PROVIDER: Meeler, Driscilla George, FNP  REFERRING DIAG:  Diagnosis  [phone] (ICD-10-CM) - Lumbar stenosis with neurogenic claudication  M54.14 (ICD-10-CM) - Thoracic radiculitis    Rationale for Evaluation and Treatment: Rehabilitation  THERAPY DIAG:  Muscle weakness (generalized)  Other abnormalities of gait and mobility  Parkinson's disease, unspecified whether dyskinesia present, unspecified whether manifestations fluctuate (HCC)  Unsteadiness on feet  Difficulty in walking, not elsewhere classified  Chronic bilateral thoracic back pain  Other lack of coordination  ONSET DATE: >3 months   SUBJECTIVE:                                                                                                                                                                                           SUBJECTIVE STATEMENT:   Pt reports that she is not doing well this AM. States that she woke up with burning pain from shoulders into mid back. Cannot thing of reason for increase pain on this day. No other updates.   PERTINENT HISTORY:  Hx of PD and familiar to this clinic for PD management.  From recent MD appointment:  "The patient is a pleasant 83 year old female who presents today for acute on chronic bilateral low back pain without radiation to the lower extremities. She describes pain that began June 2020 without injury. She also states that she has had some form of back pain for many years. She describes her pain as mild to moderate that is sharp and intermittent. Bending and lifting increases her pain. Rest can help alleviate her pain. She participated in  physical therapy at [facility] clinic in early 2020 with little relief. She continues to stay active in her home and notices that household activities increase her pain such as vacuuming and mopping. Medications include Tylenol  (  mild to moderate relief).  She was last evaluated on 10/09/2022 at which time she was doing well. She was to continue with heat and home cycling along with physical therapy exercises. She was to continue with tramadol  nightly as needed.  At today's visit she reports that she is experience of increasing pain in the morning in her mid back about the bra line. She would like to return to physical therapy and she states that this was very helpful in managing her pain. We discussed if her pain was to continue consideration would be made for another epidural. She states that she is taking tramadol  at night and doing well with this. Denies any injury. Upon palpation today she is without tenderness to the thoracic paraspinal musculature. "  PAIN:  Are you having pain? Yes: NPRS scale: 0/10 Pain location: middle back Pain description: ache Aggravating factors: sleeping Relieving factors: tramadol    PRECAUTIONS: Fall   RED FLAGS: None   WEIGHT BEARING RESTRICTIONS: No  FALLS:  Has patient fallen in last 6 months? No  LIVING ENVIRONMENT: Lives with: lives alone and daughter lives next door. Lives in: House/apartment Stairs: Yes: External: 4 steps; on right going up and on left going up Has following equipment at home:  None, but has use cane in the past. Has shower rails, shower chair; doesn't use  OCCUPATION: retired   PLOF: Independent, Independent with basic ADLs, and Independent with gait  PATIENT GOALS: stronger. Improve mobility   NEXT MD VISIT: unsure   OBJECTIVE:  Note: Objective measures were completed at Evaluation unless otherwise noted.  DIAGNOSTIC FINDINGS:  No recent imaging   PATIENT SURVEYS:  Modified Oswestry 28%   COGNITION: Overall  cognitive status: Within functional limits for tasks assessed     SENSATION: WFL  MUSCLE LENGTH: WFL  POSTURE: rounded shoulders, forward head, and decreased lumbar lordosis  PALPATION: Noted tightness in upper thoracic paraspinal. No pain to palpation   LUMBAR ROM:   AROM eval  Flexion WFL  Extension 20  Right lateral flexion   Left lateral flexion   Right rotation minimal  Left rotation ~10 deg   Cervical  AROM eval  Flexion 45  Extension 18  Right lateral flexion 20  Left lateral flexion 12  Right rotation 50  Left rotation  40  Thoracic  AROM eval  Flexion WFL  Extension 10  Right lateral flexion 25  Left lateral flexion 20  Right rotation minimal  Left rotation ~10 deg      (Blank rows = not tested)  LOWER EXTREMITY ROM:     Grossly WFL  LOWER EXTREMITY MMT:    MMT Right eval Left eval  Hip flexion 4- 4-  Hip extension    Hip abduction 4- 4-  Hip adduction 4 4  Hip internal rotation    Hip external rotation    Knee flexion 4 4  Knee extension 4+ 4+  Ankle dorsiflexion 4 4  Ankle plantarflexion    Ankle inversion    Ankle eversion     (Blank rows = not tested)    FUNCTIONAL TESTS:  5 times sit to stand: 16  Timed up and go (TUG): 12.3 sce 6 minute walk test: 1172ft 10 meter walk test: 0.2m/s Functional gait assessment: 21   4/21: miniBest: 19/28   GAIT: Distance walked: 70 Assistive device utilized: None Level of assistance: Complete Independence Comments: flexed posture.   TREATMENT DATE: 05/21/2023  TE  Nustep level 1-2 x cues for posture, trunk  rotation, and full -seated trunk flexion into erect sitting with shoulder extension x 10  Hip flexor stretch/chest stretch x 5 bil with 3 sec hold.  Shoulder roll 2 x 10, poor ROM in to retraction on second bout.  Cross body reach with single UE on rail and 3 sec hold  at end range x 10 bil  Seated low row x2 x 15 YTB  Standing on airex pad, SLS with light UE support x 8 bil  with 3 sec hold  Forward step over half bolster  x 10 with forward reach towards rail.   Manual :  Seated STM to TP in upper thoracic paraspinals x 5 min  UT stretch 2 x 25 sec bil .     Supervision assist throughout session with moderate cues for posture intermittently.  Pt reports decreased pain in the shoulders upon completion   PATIENT EDUCATION:  Education details:exercise technique, large-amplitude training/purpose, techniques for donning jacket, finger flicks Person educated: Patient Education method: IT trainer, VC Education comprehension: verbalized understanding, returned demo  HOME EXERCISE PROGRAM: Access Code: [email or web address] URL: [email or web address] Date: 02/20/2023 Prepared by: Aurora Lees  Exercises - Sit to Stand with Arm Swing  - 1 x daily - 7 x weekly - 2 sets - 10 reps - 2 hold - Seated Reach Forward, Up, and To Sides  - 1 x daily - 7 x weekly - 3 sets - 10 reps - Seated Reaching to Side and Across Body  - 1 x daily - 7 x weekly - 3 sets - 10 reps - Standing Row with Anchored Resistance  - 1 x daily - 7 x weekly - 3 sets - 10 reps  ASSESSMENT:  CLINICAL IMPRESSION:  Pt arrives highly motivated to participate in therapy session. PT treatment continued to address postural dysfunction from PD and improved management of back pain. Increased back pain on this day, requiring increased management of TP in upper thoracic spine. Demonstrated improved trunkal rotation with tactile feedback for reaching across rail  Pt  will benefit from continued skilled PT to address back pain improve strength, increase endurance, and improve overall fucntional mobility    OBJECTIVE IMPAIRMENTS: Abnormal gait, cardiopulmonary status limiting activity, decreased activity tolerance, decreased balance, decreased endurance, decreased mobility, difficulty walking, decreased ROM, decreased strength, hypomobility, increased fascial restrictions, impaired perceived functional  ability, impaired flexibility, impaired UE functional use, improper body mechanics, and postural dysfunction.   ACTIVITY LIMITATIONS: carrying, squatting, sleeping, and locomotion level  PARTICIPATION LIMITATIONS: cleaning, laundry, shopping, and community activity  PERSONAL FACTORS: Age, Fitness, and 1-2 comorbidities: lumbar stenosis   are also affecting patient's functional outcome.   REHAB POTENTIAL: Good  CLINICAL DECISION MAKING: Stable/uncomplicated  EVALUATION COMPLEXITY: Low   GOALS: Goals reviewed with patient? Yes   SHORT TERM GOALS: Target date: 06/09/2023    Patient will be independent in home exercise program to improve strength/mobility for better functional independence with ADLs. Baseline: to be given at session 2.  05/07/2023: need to be re-assessed Goal status: INITIAL   LONG TERM GOALS: Target date: 07/07/2023      Patient will improve Mod ODI score by 12.8 points  to demonstrate statistically significant improvement in mobility and quality of life.  Baseline: 28% 3/12: 26% 05/07/2023: 32% indicating moderate disability Goal status: IN PROGRESS  2.  Patient (> 35 years old) will complete five times sit to stand test in < 15 seconds indicating an increased LE strength and improved balance. Baseline: 16 sec  3/12: 11.81. sec  05/07/2023: 11.09 seconds Goal status: MET Upgraded Goal on 05/07/2023: Patient will increase MiniBest Test score to >18/28 to indicate a reduced risk for falling and demonstrate increased independence with functional mobility and ADLs.  Baseline: need to assess  Goal status: INITIAL  3.  Patient will increase 6 min walk test score by >150 to demonstrated improved access to community and improve safety.   Baseline: 1179ft 3/12: 1239ft 05/07/2023: 1250ft Goal status: IN PROGRESS  4.  Patient will increase 10 meter walk test to >1.29m/s as to improve gait speed for better community ambulation and to reduce fall risk. Baseline:  0.30m/s  3/12: 1.38m/s 05/07/2023: 1.098 m/s no AD, independently  Goal status: MET  5.  Patient will reduce timed up and go to <11 seconds to reduce fall risk and demonstrate improved transfer/gait ability. Baseline: 13sec  3/12; 12.5sec  05/07/2023: 11.01 seconds no AD, independently Goal status: MET Upgraded Goal on 05/07/2023: Patient will increase Functional Gait Assessment (FGA) score to >20/30 as to reduce fall risk and improve dynamic gait safety with community ambulation. Baseline: need to asess Goal Status: INITIAL   6.  Patient will reduced back pain to 3/10 at worst to indicate improved function with household tasks.  Baseline: 9/10 at night.  3/12: 8/10, when waking up in the morning 05/07/2023: continues to report back pain gets as high as 8/10 when bending forward Goal status: IN PROGRESS  PLAN:  PT FREQUENCY: 1-2x/week  PT DURATION: 12 weeks  PLANNED INTERVENTIONS: 97110-Therapeutic exercises, 97530- Therapeutic activity, 97112- Neuromuscular re-education, 97535- Self Care, 16109- Manual therapy, 97116- Gait training, 97014- Electrical stimulation (unattended), [id number]- Electrical stimulation (manual), Balance training, Stair training, Taping, Dry Needling, Joint mobilization, Joint manipulation, Spinal manipulation, Spinal mobilization, Scar mobilization, DME instructions, Cryotherapy, and Moist heat.  PLAN FOR NEXT SESSION:   Re-assess and expand HEP.  Continue to address PD deficits. As well as upper back pain.    Aurora Lees PT, DPT  Physical Therapist - [place]  [facility]  10:35 AM 05/21/23

## 2023-05-26 ENCOUNTER — Ambulatory Visit: Payer: Medicare Other | Attending: Family Medicine | Admitting: Physical Therapy

## 2023-05-26 ENCOUNTER — Ambulatory Visit

## 2023-05-26 DIAGNOSIS — M546 Pain in thoracic spine: Secondary | ICD-10-CM | POA: Insufficient documentation

## 2023-05-26 DIAGNOSIS — R471 Dysarthria and anarthria: Secondary | ICD-10-CM | POA: Insufficient documentation

## 2023-05-26 DIAGNOSIS — M6281 Muscle weakness (generalized): Secondary | ICD-10-CM | POA: Insufficient documentation

## 2023-05-26 DIAGNOSIS — R2681 Unsteadiness on feet: Secondary | ICD-10-CM | POA: Insufficient documentation

## 2023-05-26 DIAGNOSIS — R262 Difficulty in walking, not elsewhere classified: Secondary | ICD-10-CM | POA: Insufficient documentation

## 2023-05-26 DIAGNOSIS — R278 Other lack of coordination: Secondary | ICD-10-CM | POA: Diagnosis present

## 2023-05-26 DIAGNOSIS — G20A1 Parkinson's disease without dyskinesia, without mention of fluctuations: Secondary | ICD-10-CM | POA: Insufficient documentation

## 2023-05-26 DIAGNOSIS — R2689 Other abnormalities of gait and mobility: Secondary | ICD-10-CM | POA: Diagnosis present

## 2023-05-26 DIAGNOSIS — G8929 Other chronic pain: Secondary | ICD-10-CM | POA: Diagnosis present

## 2023-05-26 NOTE — Therapy (Signed)
 OUTPATIENT PHYSICAL THERAPY THORACOLUMBAR TREATMENT  Patient Name: Katelyn Crawford MRN: [id number] DOB:1940-07-09, 83 y.o., female Today's Date: 05/26/2023   END OF SESSION:   PT End of Session - 05/26/23 1009     Visit Number 26    Number of Visits 38    PT Start Time 1019    PT Stop Time 1059    PT Time Calculation (min) 40 min    Equipment Utilized During Treatment Gait belt    Activity Tolerance Patient tolerated treatment well    Behavior During Therapy WFL for tasks assessed/performed                 Past Medical History:  Diagnosis Date   Anemia    Aortic atherosclerosis (HCC)    Arthritis    Cardiomegaly    Cardiomyopathy (HCC)    Cholelithiasis    Chronic kidney disease, stage 2 (mild)    Chronic sinus bradycardia    Coronary artery disease 03/10/2012   a.) LHC 03/10/2012: 40% mLAD, 50% pRI, 50% pRCA, 90% mRCA, 50% dRCA, 50% RPDA - unable to cannulate RCA - med mgmt; b.) LHC/PCI 06/25/2013: 30% pLAD, 40% mLAD, 20% mLCx, 30% RI, 30% pRCA, 70% mRCA (2.5 mm Promus DES), 40% dRCA-1, 90% dRCA-2 (2.5 mm Promus DES)   DDD (degenerative disc disease), thoracolumbar    Diverticulosis    Dizziness    Environmental allergies    Full dentures    GERD (gastroesophageal reflux disease) 06/25/2013   H/O bilateral cataract extraction 2019   History of diverticulosis    History of kidney stones    HLD (hyperlipidemia)    Hyperlipidemia, unspecified    Hypertension    Nephrolithiasis    Osteoporosis, post-menopausal    Parkinson disease (HCC)    Pre-diabetes    PVC's (premature ventricular contractions)    Scoliosis    Thoracic kyphosis    Trifascicular bundle branch block    a.) 1st degree AVB + RBBB + LAFB   Vascular disease    Past Surgical History:  Procedure Laterality Date   APPENDECTOMY     BREAST EXCISIONAL BIOPSY Left 1984   neg   BREAST EXCISIONAL BIOPSY Right 1997   lipoma   CATARACT EXTRACTION W/PHACO Left 09/02/2017   Procedure: CATARACT  EXTRACTION PHACO AND INTRAOCULAR LENS PLACEMENT (IOC)  LEFT;  Surgeon: Annell Kidney, MD;  Location: [facility];  Service: Ophthalmology;  Laterality: Left;   CATARACT EXTRACTION W/PHACO Right 10/01/2017   Procedure: CATARACT EXTRACTION PHACO AND INTRAOCULAR LENS PLACEMENT (IOC) RIGHT;  Surgeon: Annell Kidney, MD;  Location: [facility];  Service: Ophthalmology;  Laterality: Right;   COLONOSCOPY WITH PROPOFOL  N/A 11/27/2016   Procedure: COLONOSCOPY WITH PROPOFOL ;  Surgeon: Toledo, Alphonsus Jeans, MD;  Location: [facility];  Service: Gastroenterology;  Laterality: N/A;   CORONARY ANGIOPLASTY WITH STENT PLACEMENT Left 06/25/2013   Procedure: CORONARY ANGIOPLASTY WITH STENT PLACEMENT; Location: [facility]; Surgeon: Arletta Lager, MD   KNEE ARTHROPLASTY Left 01/22/2017   Procedure: COMPUTER ASSISTED TOTAL KNEE ARTHROPLASTY;  Surgeon: Arlyne Lame, MD;  Location: [facility];  Service: Orthopedics;  Laterality: Left;   KNEE ARTHROSCOPY Left 02/01/2015   Procedure: ARTHROSCOPY KNEE, partial medial & lateral menisectomy,,medial and patelofemoral chondroplasty;  Surgeon: Arlyne Lame, MD;  Location: [facility];  Service: Orthopedics;  Laterality: Left;   LEFT HEART CATH AND CORONARY ANGIOGRAPHY Left 03/10/2012   Procedure: LEFT HEART CATH AND CORONARY ANGIOGRAPHY; Location: [facility]; Surgeon: Percival Brace, MD   LITHOTRIPSY Right [phone]  RIGHT AXILLARY LIPOMA REMOVAL     TUBAL LIGATION     VARICOSE VEIN SURGERY     Patient Active Problem List   Diagnosis Date Noted   Primary osteoarthritis of hip 02/09/2017   Primary osteoarthritis of knee 02/09/2017   Chronic sinus bradycardia 01/22/2017   Degenerative disc disease, lumbar 01/22/2017   Dizziness 01/22/2017   Nephrolithiasis 01/22/2017   Osteoporosis, 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/22/2017   PVC's (premature ventricular contractions) 01/22/2017   S/P total knee arthroplasty 01/22/2017   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    CKD (chronic kidney disease), 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   GERD (gastroesophageal reflux disease) 06/25/2013   Coronary artery disease involving native coronary artery of native heart 06/24/2013   Essential hypertension 06/21/2013   Mixed hyperlipidemia 06/21/2013    PCP: Yehuda Helms, MD   REFERRING PROVIDER: Meeler, Driscilla George, FNP  REFERRING DIAG:  Diagnosis  [phone] (ICD-10-CM) - Lumbar stenosis with neurogenic claudication  M54.14 (ICD-10-CM) - Thoracic radiculitis    Rationale for Evaluation and Treatment: Rehabilitation  THERAPY DIAG:  Muscle weakness (generalized)  Parkinson's disease, unspecified whether dyskinesia present, unspecified whether manifestations fluctuate (HCC)  Unsteadiness on feet  Difficulty in walking, not elsewhere classified  Other abnormalities of gait and mobility  Chronic bilateral thoracic back pain  Other lack of coordination  ONSET DATE: >3 months   SUBJECTIVE:                                                                                                                                                                                           SUBJECTIVE STATEMENT:   Pt reports that she had a rough weekend with intermittent back pain 5-6/10 at time. Reports that she was feeling a little better this AM, but when she arrived to clinic, she started to feel "out of it" . Reports that her BP was high this AM. Wanting to assess BP at start of treatment.   PERTINENT HISTORY:  Hx of PD and familiar to this clinic for PD management.  From recent MD appointment:  "The patient is a pleasant 83 year old female who presents today for acute on chronic bilateral low back pain without radiation to the lower extremities. She describes pain that began June 2020 without injury. She also states that she has had some form of back pain for many years. She describes her pain as mild to moderate that is sharp and intermittent. Bending and lifting increases her  pain. Rest can help alleviate her pain. She participated in physical therapy at [facility] in early 2020 with little relief. She continues to stay active in her home and notices that household activities  increase her pain such as vacuuming and mopping. Medications include Tylenol  (mild to moderate relief).  She was last evaluated on 10/09/2022 at which time she was doing well. She was to continue with heat and home cycling along with physical therapy exercises. She was to continue with tramadol  nightly as needed.  At today's visit she reports that she is experience of increasing pain in the morning in her mid back about the bra line. She would like to return to physical therapy and she states that this was very helpful in managing her pain. We discussed if her pain was to continue consideration would be made for another epidural. She states that she is taking tramadol  at night and doing well with this. Denies any injury. Upon palpation today she is without tenderness to the thoracic paraspinal musculature. "  PAIN:  Are you having pain? Yes: NPRS scale: 0/10 Pain location: middle back Pain description: ache Aggravating factors: sleeping Relieving factors: tramadol    PRECAUTIONS: Fall   RED FLAGS: None   WEIGHT BEARING RESTRICTIONS: No  FALLS:  Has patient fallen in last 6 months? No  LIVING ENVIRONMENT: Lives with: lives alone and daughter lives next door. Lives in: House/apartment Stairs: Yes: External: 4 steps; on right going up and on left going up Has following equipment at home:  None, but has use cane in the past. Has shower rails, shower chair; doesn't use  OCCUPATION: retired   PLOF: Independent, Independent with basic ADLs, and Independent with gait  PATIENT GOALS: stronger. Improve mobility   NEXT MD VISIT: unsure   OBJECTIVE:  Note: Objective measures were completed at Evaluation unless otherwise noted.  DIAGNOSTIC FINDINGS:  No recent imaging   PATIENT  SURVEYS:  Modified Oswestry 28%   COGNITION: Overall cognitive status: Within functional limits for tasks assessed     SENSATION: WFL  MUSCLE LENGTH: WFL  POSTURE: rounded shoulders, forward head, and decreased lumbar lordosis  PALPATION: Noted tightness in upper thoracic paraspinal. No pain to palpation   LUMBAR ROM:   AROM eval  Flexion WFL  Extension 20  Right lateral flexion   Left lateral flexion   Right rotation minimal  Left rotation ~10 deg   Cervical  AROM eval  Flexion 45  Extension 18  Right lateral flexion 20  Left lateral flexion 12  Right rotation 50  Left rotation  40  Thoracic  AROM eval  Flexion WFL  Extension 10  Right lateral flexion 25  Left lateral flexion 20  Right rotation minimal  Left rotation ~10 deg      (Blank rows = not tested)  LOWER EXTREMITY ROM:     Grossly WFL  LOWER EXTREMITY MMT:    MMT Right eval Left eval  Hip flexion 4- 4-  Hip extension    Hip abduction 4- 4-  Hip adduction 4 4  Hip internal rotation    Hip external rotation    Knee flexion 4 4  Knee extension 4+ 4+  Ankle dorsiflexion 4 4  Ankle plantarflexion    Ankle inversion    Ankle eversion     (Blank rows = not tested)    FUNCTIONAL TESTS:  5 times sit to stand: 16  Timed up and go (TUG): 12.3 sce 6 minute walk test: 1152ft 10 meter walk test: 0.72m/s Functional gait assessment: 21   4/21: miniBest: 19/28   GAIT: Distance walked: 70 Assistive device utilized: None Level of assistance: Complete Independence Comments: flexed posture.   TREATMENT DATE: 05/26/2023 Pt  reports feeling "out of it"  VS assessment at start of PT treatment:  Sitting 127/59(79) HR 67 Standing: 127/63(83) HR 70  Seated:  Reach to floor to erect sitting with shoulder extension x 12  Cross body reach with supination x 10 bil  Low row 2x 12 with RTB.  Single UE pull down RTB 2x 12  Trunk extension over bolster x 5 with hand crossed. Noted that pt is limited  to neutral in trunk extension with hands over chest. Trunk extension over bolster with shoulder extension/retraction 2x 8; severe ROM deficits.  Cues for cervical alignment and increased ROM at tolerated   Nustep reciprocal movement training x 7 min cues for posture and improved trunkal ROM at end range.    Supervision assist throughout session with moderate cues for posture intermittently.  Pt reports decreased pain in the shoulders upon completion   PATIENT EDUCATION:  Education details:exercise technique, large-amplitude training/purpose, techniques for donning jacket, finger flicks Person educated: Patient Education method: IT trainer, VC Education comprehension: verbalized understanding, returned demo  HOME EXERCISE PROGRAM: Access Code: [email or web address] URL: [email or web address] Date: 02/20/2023 Prepared by: Aurora Lees  Exercises - Sit to Stand with Arm Swing  - 1 x daily - 7 x weekly - 2 sets - 10 reps - 2 hold - Seated Reach Forward, Up, and To Sides  - 1 x daily - 7 x weekly - 3 sets - 10 reps - Seated Reaching to Side and Across Body  - 1 x daily - 7 x weekly - 3 sets - 10 reps - Standing Row with Anchored Resistance  - 1 x daily - 7 x weekly - 3 sets - 10 reps  ASSESSMENT:  CLINICAL IMPRESSION:  Pt arrives highly motivated to participate in therapy session. PT treatment continued to address postural dysfunction from PD and improved management of back pain with emphasis on scapular activation and improved thoracic mobility. Severely limited trunk extension ROM on this day. Tolerated all intervention well on this day without reports of increased back pain.   Pt  will benefit from continued skilled PT to address back pain improve strength, increase endurance, and improve overall fucntional mobility    OBJECTIVE IMPAIRMENTS: Abnormal gait, cardiopulmonary status limiting activity, decreased activity tolerance, decreased balance, decreased endurance, decreased  mobility, difficulty walking, decreased ROM, decreased strength, hypomobility, increased fascial restrictions, impaired perceived functional ability, impaired flexibility, impaired UE functional use, improper body mechanics, and postural dysfunction.   ACTIVITY LIMITATIONS: carrying, squatting, sleeping, and locomotion level  PARTICIPATION LIMITATIONS: cleaning, laundry, shopping, and community activity  PERSONAL FACTORS: Age, Fitness, and 1-2 comorbidities: lumbar stenosis   are also affecting patient's functional outcome.   REHAB POTENTIAL: Good  CLINICAL DECISION MAKING: Stable/uncomplicated  EVALUATION COMPLEXITY: Low   GOALS: Goals reviewed with patient? Yes   SHORT TERM GOALS: Target date: 06/09/2023    Patient will be independent in home exercise program to improve strength/mobility for better functional independence with ADLs. Baseline: to be given at session 2.  05/07/2023: need to be re-assessed Goal status: INITIAL   LONG TERM GOALS: Target date: 07/07/2023      Patient will improve Mod ODI score by 12.8 points  to demonstrate statistically significant improvement in mobility and quality of life.  Baseline: 28% 3/12: 26% 05/07/2023: 32% indicating moderate disability Goal status: IN PROGRESS  2.  Patient (> 7 years old) will complete five times sit to stand test in < 15 seconds indicating an increased LE strength and improved balance. Baseline: 16 sec  3/12: 11.81. sec  05/07/2023: 11.09 seconds Goal status: MET Upgraded Goal on 05/07/2023: Patient will increase MiniBest Test score to >18/28 to indicate a reduced risk for falling and demonstrate increased independence with functional mobility and ADLs.  Baseline: need to assess  Goal status: INITIAL  3.  Patient will increase 6 min walk test score by >150 to demonstrated improved access to community and improve safety.   Baseline: 1176ft 3/12: 1276ft 05/07/2023: 1234ft Goal status: IN PROGRESS  4.  Patient  will increase 10 meter walk test to >1.47m/s as to improve gait speed for better community ambulation and to reduce fall risk. Baseline: 0.45m/s  3/12: 1.66m/s 05/07/2023: 1.098 m/s no AD, independently  Goal status: MET  5.  Patient will reduce timed up and go to <11 seconds to reduce fall risk and demonstrate improved transfer/gait ability. Baseline: 13sec  3/12; 12.5sec  05/07/2023: 11.01 seconds no AD, independently Goal status: MET Upgraded Goal on 05/07/2023: Patient will increase Functional Gait Assessment (FGA) score to >20/30 as to reduce fall risk and improve dynamic gait safety with community ambulation. Baseline: need to asess Goal Status: INITIAL   6.  Patient will reduced back pain to 3/10 at worst to indicate improved function with household tasks.  Baseline: 9/10 at night.  3/12: 8/10, when waking up in the morning 05/07/2023: continues to report back pain gets as high as 8/10 when bending forward Goal status: IN PROGRESS  PLAN:  PT FREQUENCY: 1-2x/week  PT DURATION: 12 weeks  PLANNED INTERVENTIONS: 97110-Therapeutic exercises, 97530- Therapeutic activity, 97112- Neuromuscular re-education, 97535- Self Care, 16109- Manual therapy, 97116- Gait training, 97014- Electrical stimulation (unattended), [phone]- Electrical stimulation (manual), Balance training, Stair training, Taping, Dry Needling, Joint mobilization, Joint manipulation, Spinal manipulation, Spinal mobilization, Scar mobilization, DME instructions, Cryotherapy, and Moist heat.  PLAN FOR NEXT SESSION:   Re-assess and expand HEP.  Large amplitude movements. For PD deficits.  Continue to address upper back pain.    Aurora Lees PT, DPT  Physical Therapist - [place]  [facility]  10:17 AM 05/26/23

## 2023-05-26 NOTE — Therapy (Signed)
 OUTPATIENT SPEECH LANGUAGE PATHOLOGY PARKINSON'S TREATMENT   Patient Name: Katelyn Crawford MRN: [id number] DOB:1940/08/04, 83 y.o., female Today's Date: 05/26/2023  PCP: Shary Deems, MD REFERRING PROVIDER: Devora Folks, MD   End of Session - 05/26/23 1215     Visit Number 2    Number of Visits 24    Date for SLP Re-Evaluation 08/04/23    SLP Start Time 1100    SLP Stop Time  1145    SLP Time Calculation (min) 45 min    Activity Tolerance Patient tolerated treatment well             Past Medical History:  Diagnosis Date   Anemia    Aortic atherosclerosis (HCC)    Arthritis    Cardiomegaly    Cardiomyopathy (HCC)    Cholelithiasis    Chronic kidney disease, stage 2 (mild)    Chronic sinus bradycardia    Coronary artery disease 03/10/2012   a.) LHC 03/10/2012: 40% mLAD, 50% pRI, 50% pRCA, 90% mRCA, 50% dRCA, 50% RPDA - unable to cannulate RCA - med mgmt; b.) LHC/PCI 06/25/2013: 30% pLAD, 40% mLAD, 20% mLCx, 30% RI, 30% pRCA, 70% mRCA (2.5 mm Promus DES), 40% dRCA-1, 90% dRCA-2 (2.5 mm Promus DES)   DDD (degenerative disc disease), thoracolumbar    Diverticulosis    Dizziness    Environmental allergies    Full dentures    GERD (gastroesophageal reflux disease) 06/25/2013   H/O bilateral cataract extraction 2019   History of diverticulosis    History of kidney stones    HLD (hyperlipidemia)    Hyperlipidemia, unspecified    Hypertension    Nephrolithiasis    Osteoporosis, post-menopausal    Parkinson disease (HCC)    Pre-diabetes    PVC's (premature ventricular contractions)    Scoliosis    Thoracic kyphosis    Trifascicular bundle branch block    a.) 1st degree AVB + RBBB + LAFB   Vascular disease    Past Surgical History:  Procedure Laterality Date   APPENDECTOMY     BREAST EXCISIONAL BIOPSY Left 1984   neg   BREAST EXCISIONAL BIOPSY Right 1997   lipoma   CATARACT EXTRACTION W/PHACO Left 09/02/2017   Procedure: CATARACT EXTRACTION PHACO AND INTRAOCULAR  LENS PLACEMENT (IOC)  LEFT;  Surgeon: Annell Kidney, MD;  Location: [facility];  Service: Ophthalmology;  Laterality: Left;   CATARACT EXTRACTION W/PHACO Right 10/01/2017   Procedure: CATARACT EXTRACTION PHACO AND INTRAOCULAR LENS PLACEMENT (IOC) RIGHT;  Surgeon: Annell Kidney, MD;  Location: [facility];  Service: Ophthalmology;  Laterality: Right;   COLONOSCOPY WITH PROPOFOL  N/A 11/27/2016   Procedure: COLONOSCOPY WITH PROPOFOL ;  Surgeon: Toledo, Alphonsus Jeans, MD;  Location: [facility];  Service: Gastroenterology;  Laterality: N/A;   CORONARY ANGIOPLASTY WITH STENT PLACEMENT Left 06/25/2013   Procedure: CORONARY ANGIOPLASTY WITH STENT PLACEMENT; Location: [facility]; Surgeon: Arletta Lager, MD   KNEE ARTHROPLASTY Left 01/22/2017   Procedure: COMPUTER ASSISTED TOTAL KNEE ARTHROPLASTY;  Surgeon: Arlyne Lame, MD;  Location: [facility];  Service: Orthopedics;  Laterality: Left;   KNEE ARTHROSCOPY Left 02/01/2015   Procedure: ARTHROSCOPY KNEE, partial medial & lateral menisectomy,,medial and patelofemoral chondroplasty;  Surgeon: Arlyne Lame, MD;  Location: [facility];  Service: Orthopedics;  Laterality: Left;   LEFT HEART CATH AND CORONARY ANGIOGRAPHY Left 03/10/2012   Procedure: LEFT HEART CATH AND CORONARY ANGIOGRAPHY; Location: [facility]; Surgeon: Percival Brace, MD   LITHOTRIPSY Right 1995   RIGHT AXILLARY LIPOMA REMOVAL  TUBAL LIGATION     VARICOSE VEIN SURGERY     Patient Active Problem List   Diagnosis Date Noted   Primary osteoarthritis of hip 02/09/2017   Primary osteoarthritis of knee 02/09/2017   Chronic sinus bradycardia 01/22/2017   Degenerative disc disease, lumbar 01/22/2017   Dizziness 01/22/2017   Nephrolithiasis 01/22/2017   Osteoporosis, 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/22/2017   PVC's (premature ventricular contractions) 01/22/2017   S/P total knee arthroplasty 01/22/2017   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  Pt accompanied by: self  PERTINENT HISTORY: Pt is an 83 y.o. 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 Pt with PMHx GERD, HTN, CAD, and osteoathritis. Per chart review, pt previously participated in LSVT in 2023. Noted, MBS on 04/12/20 with findings of mild oral dysphagia. Regular diet and thin liquids was recommended at that time.   DIAGNOSTIC FINDINGS: MRI brain, 10/26/22, "No acute finding by MRI. Moderate chronic small-vessel ischemic changes of the cerebral hemispheric white matter, which is considerably progressive when compared to the study of 2019."  PAIN:  Are you having pain? No  FALLS: Has patient fallen in last 6 months?  See PT evaluation for details  LIVING ENVIRONMENT: Lives with: lives alone; daughter lives next door   PLOF:  Level of assistance: Independent with ADLs Employment: Retired  PATIENT GOALS    for family to hear me  OBJECTIVE:  TODAY'S TREATMENT:  Initiated intensity based Parkinson's treatment. Introduced HEP. Voice ex's completed as follows: Sustained /a/: x10, 84dB Ascending pitch glides: x10, 84dB Descending pitch glides: x10, 84db   Pt read x10 functional phrases twice through with loud and intentional voice, averaging 84dB.    During x2, 3 minute speech samples pt averaged 78dB.    Pt benefited from use of biofeeback using Voice Analyst app.    Pt educated re:  changes to speech/voice due to Parkinson's Disease, voice ex's, SLP POC, and HEP.   PATIENT EDUCATION: Education details: as above Person educated: Patient Education method: Explanation Education comprehension: verbalized understanding and needs further education   HOME EXERCISE PROGRAM: As above (vocal ex's, reading)   GOALS: Goals reviewed with patient? Yes  SHORT TERM GOALS: Target date: 10 sessions  Pt will participate in further cognitive-linguistic assessment with additional goals, as appropriate. Baseline: Goal status: INITIAL  2.  The patient will complete Daily Tasks (Maximum duration "ah", High/Lows, and Functional Phrases) at average loudness >/= 80 dB and with loud, good quality voice with min cues.  Baseline:  Goal status: INITIAL  3.  The patient will complete Hierarchal Speech Loudness reading drills (words/phrases, sentences) at average >/= 75 dB and with loud, good quality voice with min cues.  Baseline:  Goal status: INITIAL  4.  The patient will participate in 5-8 minutes conversation, maintaining average loudness of 75 dB and good quality voice with modified independence.  Baseline:  Goal status: INITIAL    LONG TERM GOALS: Target date: 12 weeks  The patient will complete Daily Tasks (  Maximum duration "ah", High/Lows, and Functional Phrases) at average loudness >/= 80 dB and with loud, good quality voice.  Baseline:  Goal status: INITIAL  2.  The patient will complete Hierarchal Speech Loudness reading drills (words/phrases, sentences, and paragraph) at average >/= 75 dB and with loud, good quality voice.  Baseline:  Goal status: INITIAL  3.  The patient will participate in 20 minutes conversation, maintaining average loudness of >/= 75 dB and loud, good quality voice.  Baseline:  Goal status: INITIAL  4.  The patient will report improved communication effectiveness as measured by the Communication Effectiveness Survey.  Baseline: 05/12/23:  12/32 Goal status: INITIAL    ASSESSMENT:  CLINICAL IMPRESSION: Pt is an 83 y.o. 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 Pt with PMHx GERD, HTN, CAD, and osteoathritis. Per chart review, pt previously participated in LSVT in 2023. Recent assessment completed via informal means and PROM. 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 Patient reports requests to repeat herself from friends and family, and has noticed a decline in her vocal quality over several years. Patient did exhibit some strain during phonation tasks with stimulability testing, but was stimulable for improved loudness, pitch variation and vocal quality in reading and conversation using model and cues for "Loud, like your talking to a crowd." See details of tx session above. Recommend course of intensity based ST to maximize pt's vocal quality and intelligibility for conversations with family and friends to reduce social isolation and improve quality of life.  OBJECTIVE IMPAIRMENTS include dysarthria. These impairments are limiting patient from effectively communicating at home and in community. Factors affecting potential to achieve goals and functional outcome are co-morbidities. Patient will benefit from skilled SLP services to address above impairments and improve overall function.  REHAB POTENTIAL: Good  PLAN: SLP FREQUENCY: 2x/week  SLP DURATION: 12 weeks  PLANNED INTERVENTIONS: Cueing hierachy, Internal/external aids, Functional tasks, SLP instruction and feedback, Compensatory strategies, and Patient/family education   Dia Forget, M.S., CCC-SLP Speech-Language Pathologist [place] [facility] [phone] [place])   [facility] [address] [place], [place], [address] Phone: [phone]   Fax:  [phone]

## 2023-05-28 ENCOUNTER — Ambulatory Visit: Payer: Medicare Other | Admitting: Physical Therapy

## 2023-05-28 DIAGNOSIS — M6281 Muscle weakness (generalized): Secondary | ICD-10-CM

## 2023-05-28 DIAGNOSIS — G20A1 Parkinson's disease without dyskinesia, without mention of fluctuations: Secondary | ICD-10-CM

## 2023-05-28 DIAGNOSIS — R262 Difficulty in walking, not elsewhere classified: Secondary | ICD-10-CM

## 2023-05-28 DIAGNOSIS — R2681 Unsteadiness on feet: Secondary | ICD-10-CM

## 2023-05-28 DIAGNOSIS — G8929 Other chronic pain: Secondary | ICD-10-CM

## 2023-05-28 DIAGNOSIS — R2689 Other abnormalities of gait and mobility: Secondary | ICD-10-CM

## 2023-05-28 DIAGNOSIS — R278 Other lack of coordination: Secondary | ICD-10-CM

## 2023-05-28 NOTE — Therapy (Signed)
 OUTPATIENT PHYSICAL THERAPY THORACOLUMBAR TREATMENT  Patient Name: Katelyn Crawford MRN: [id number] DOB:[birth year], 83 y.o., female Today's Date: 05/28/2023   END OF SESSION:   PT End of Session - 05/28/23 1049     Visit Number 27    Number of Visits 38    Date for PT Re-Evaluation 07/07/23    PT Start Time 1019    PT Stop Time 1100    PT Time Calculation (min) 41 min    Equipment Utilized During Treatment Gait belt    Activity Tolerance Patient tolerated treatment well    Behavior During Therapy WFL for tasks assessed/performed                 Past Medical History:  Diagnosis Date   Anemia    Aortic atherosclerosis (HCC)    Arthritis    Cardiomegaly    Cardiomyopathy (HCC)    Cholelithiasis    Chronic kidney disease, stage 2 (mild)    Chronic sinus bradycardia    Coronary artery disease 03/10/2012   a.) LHC 03/10/2012: 40% mLAD, 50% pRI, 50% pRCA, 90% mRCA, 50% dRCA, 50% RPDA - unable to cannulate RCA - med mgmt; b.) LHC/PCI 06/25/2013: 30% pLAD, 40% mLAD, 20% mLCx, 30% RI, 30% pRCA, 70% mRCA (2.5 mm Promus DES), 40% dRCA-1, 90% dRCA-2 (2.5 mm Promus DES)   DDD (degenerative disc disease), thoracolumbar    Diverticulosis    Dizziness    Environmental allergies    Full dentures    GERD (gastroesophageal reflux disease) 06/25/2013   H/O bilateral cataract extraction 2019   History of diverticulosis    History of kidney stones    HLD (hyperlipidemia)    Hyperlipidemia, unspecified    Hypertension    Nephrolithiasis    Osteoporosis, post-menopausal    Parkinson disease (HCC)    Pre-diabetes    PVC's (premature ventricular contractions)    Scoliosis    Thoracic kyphosis    Trifascicular bundle branch block    a.) 1st degree AVB + RBBB + LAFB   Vascular disease    Past Surgical History:  Procedure Laterality Date   APPENDECTOMY     BREAST EXCISIONAL BIOPSY Left 1984   neg   BREAST EXCISIONAL BIOPSY Right 1997   lipoma   CATARACT EXTRACTION W/PHACO Left  09/02/2017   Procedure: CATARACT EXTRACTION PHACO AND INTRAOCULAR LENS PLACEMENT (IOC)  LEFT;  Surgeon: Annell Kidney, MD;  Location: [facility];  Service: Ophthalmology;  Laterality: Left;   CATARACT EXTRACTION W/PHACO Right 10/01/2017   Procedure: CATARACT EXTRACTION PHACO AND INTRAOCULAR LENS PLACEMENT (IOC) RIGHT;  Surgeon: Annell Kidney, MD;  Location: [facility];  Service: Ophthalmology;  Laterality: Right;   COLONOSCOPY WITH PROPOFOL  N/A 11/27/2016   Procedure: COLONOSCOPY WITH PROPOFOL ;  Surgeon: Toledo, Alphonsus Jeans, MD;  Location: [facility];  Service: Gastroenterology;  Laterality: N/A;   CORONARY ANGIOPLASTY WITH STENT PLACEMENT Left 06/25/2013   Procedure: CORONARY ANGIOPLASTY WITH STENT PLACEMENT; Location: [facility]; Surgeon: Arletta Lager, MD   KNEE ARTHROPLASTY Left 01/22/2017   Procedure: COMPUTER ASSISTED TOTAL KNEE ARTHROPLASTY;  Surgeon: Arlyne Lame, MD;  Location: [facility];  Service: Orthopedics;  Laterality: Left;   KNEE ARTHROSCOPY Left 02/01/2015   Procedure: ARTHROSCOPY KNEE, partial medial & lateral menisectomy,,medial and patelofemoral chondroplasty;  Surgeon: Arlyne Lame, MD;  Location: [facility];  Service: Orthopedics;  Laterality: Left;   LEFT HEART CATH AND CORONARY ANGIOGRAPHY Left 03/10/2012   Procedure: LEFT HEART CATH AND CORONARY ANGIOGRAPHY; Location: [facility]; Surgeon: Percival Brace,  MD   LITHOTRIPSY Right 1995   RIGHT AXILLARY LIPOMA REMOVAL     TUBAL LIGATION     VARICOSE VEIN SURGERY     Patient Active Problem List   Diagnosis Date Noted   Primary osteoarthritis of hip 02/09/2017   Primary osteoarthritis of knee 02/09/2017   Chronic sinus bradycardia 01/22/2017   Degenerative disc disease, lumbar 01/22/2017   Dizziness 01/22/2017   Nephrolithiasis 01/22/2017   Osteoporosis, 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/22/2017   PVC's (premature ventricular contractions) 01/22/2017   S/P total knee arthroplasty 01/22/2017   Trifascicular  bundle branch block 03/02/2015   CKD (chronic kidney disease), 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   GERD (gastroesophageal reflux disease) 06/25/2013   Coronary artery disease involving native coronary artery of native heart 06/24/2013   Essential hypertension 06/21/2013   Mixed hyperlipidemia 06/21/2013    PCP: Yehuda Helms, MD   REFERRING PROVIDER: Meeler, Driscilla George, FNP  REFERRING DIAG:  Diagnosis  [phone] (ICD-10-CM) - Lumbar stenosis with neurogenic claudication  M54.14 (ICD-10-CM) - Thoracic radiculitis    Rationale for Evaluation and Treatment: Rehabilitation  THERAPY DIAG:  Muscle weakness (generalized)  Parkinson's disease, unspecified whether dyskinesia present, unspecified whether manifestations fluctuate (HCC)  Unsteadiness on feet  Difficulty in walking, not elsewhere classified  Other abnormalities of gait and mobility  Chronic bilateral thoracic back pain  Other lack of coordination  ONSET DATE: >3 months   SUBJECTIVE:                                                                                                                                                                                           SUBJECTIVE STATEMENT:   Pt reports that she is doing okay this AM. Reports that she has burning pain in entire body yesterday, so she did not do much yesterday. Reports that Daughter has reached out to neurology.   PERTINENT HISTORY:  Hx of PD and familiar to this clinic for PD management.  From recent MD appointment:  "The patient is a pleasant 83 year old female who presents today for acute on chronic bilateral low back pain without radiation to the lower extremities. She describes pain that began June 2020 without injury. She also states that she has had some form of back pain for many years. She describes her pain as mild to moderate that is sharp and intermittent. Bending and lifting increases her pain. Rest can help alleviate her pain. She participated in  physical therapy at [facility] in early 2020 with little relief. She continues to stay active in her home and notices that household activities increase her pain such as vacuuming and mopping. Medications include Tylenol  (mild to  moderate relief).  She was last evaluated on 10/09/2022 at which time she was doing well. She was to continue with heat and home cycling along with physical therapy exercises. She was to continue with tramadol  nightly as needed.  At today's visit she reports that she is experience of increasing pain in the morning in her mid back about the bra line. She would like to return to physical therapy and she states that this was very helpful in managing her pain. We discussed if her pain was to continue consideration would be made for another epidural. She states that she is taking tramadol  at night and doing well with this. Denies any injury. Upon palpation today she is without tenderness to the thoracic paraspinal musculature. "  PAIN:  Are you having pain? Yes: NPRS scale: 0/10 Pain location: middle back Pain description: ache Aggravating factors: sleeping Relieving factors: tramadol    PRECAUTIONS: Fall   RED FLAGS: None   WEIGHT BEARING RESTRICTIONS: No  FALLS:  Has patient fallen in last 6 months? No  LIVING ENVIRONMENT: Lives with: lives alone and daughter lives next door. Lives in: House/apartment Stairs: Yes: External: 4 steps; on right going up and on left going up Has following equipment at home:  None, but has use cane in the past. Has shower rails, shower chair; doesn't use  OCCUPATION: retired   PLOF: Independent, Independent with basic ADLs, and Independent with gait  PATIENT GOALS: stronger. Improve mobility   NEXT MD VISIT: unsure   OBJECTIVE:  Note: Objective measures were completed at Evaluation unless otherwise noted.  DIAGNOSTIC FINDINGS:  No recent imaging   PATIENT SURVEYS:  Modified Oswestry 28%   COGNITION: Overall  cognitive status: Within functional limits for tasks assessed     SENSATION: WFL  MUSCLE LENGTH: WFL  POSTURE: rounded shoulders, forward head, and decreased lumbar lordosis  PALPATION: Noted tightness in upper thoracic paraspinal. No pain to palpation   LUMBAR ROM:   AROM eval  Flexion WFL  Extension 20  Right lateral flexion   Left lateral flexion   Right rotation minimal  Left rotation ~10 deg   Cervical  AROM eval  Flexion 45  Extension 18  Right lateral flexion 20  Left lateral flexion 12  Right rotation 50  Left rotation  40  Thoracic  AROM eval  Flexion WFL  Extension 10  Right lateral flexion 25  Left lateral flexion 20  Right rotation minimal  Left rotation ~10 deg      (Blank rows = not tested)  LOWER EXTREMITY ROM:     Grossly WFL  LOWER EXTREMITY MMT:    MMT Right eval Left eval  Hip flexion 4- 4-  Hip extension    Hip abduction 4- 4-  Hip adduction 4 4  Hip internal rotation    Hip external rotation    Knee flexion 4 4  Knee extension 4+ 4+  Ankle dorsiflexion 4 4  Ankle plantarflexion    Ankle inversion    Ankle eversion     (Blank rows = not tested)    FUNCTIONAL TESTS:  5 times sit to stand: 16  Timed up and go (TUG): 12.3 sce 6 minute walk test: 1176ft 10 meter walk test: 0.74m/s Functional gait assessment: 21   4/21: miniBest: 19/28   GAIT: Distance walked: 70 Assistive device utilized: None Level of assistance: Complete Independence Comments: flexed posture.   TREATMENT DATE: 05/28/2023 VS assessed 140/86 HR 86   Sit<>stand with UE swing into extension with  stance.  X12  Standing lateral reach/step x 10 bil  Standing cross body reach to clap hands x 10 bil ( improved horz abd on this day.   Reciprocal Stepping over bolster x 12 bil  Lateral stepping BLE over bolster x 10 bil   Weighted gait with 3# AW x 341ft with interruption for rest room break at 68ft.   Staggered stance weight shift with UE swing x 10  bil Superior/cross body reach with weight shift x 10  Pt noted to have improved arm swing with gait throughout session.   Supervision assist throughout session for safety.   PATIENT EDUCATION:  Education details:exercise technique, large-amplitude training/purpose, techniques for donning jacket, finger flicks Person educated: Patient Education method: IT trainer, VC Education comprehension: verbalized understanding, returned demo  HOME EXERCISE PROGRAM: Access Code: [email or web address] URL: [email or web address] Date: 02/20/2023 Prepared by: Aurora Lees  Exercises - Sit to Stand with Arm Swing  - 1 x daily - 7 x weekly - 2 sets - 10 reps - 2 hold - Seated Reach Forward, Up, and To Sides  - 1 x daily - 7 x weekly - 3 sets - 10 reps - Seated Reaching to Side and Across Body  - 1 x daily - 7 x weekly - 3 sets - 10 reps - Standing Row with Anchored Resistance  - 1 x daily - 7 x weekly - 3 sets - 10 reps  Access Code: [facility] URL: [email or web address] Date: 05/28/2023 Prepared by: Aurora Lees  Exercises - Step Sideways with Arms Reaching  - 1 x daily - 7 x weekly - 3 sets - 10 reps - 2 hold - Staggered Stance Weight Shift with Arms Reaching  - 1 x daily - 7 x weekly - 3 sets - 10 reps - 2 hold - Standing Reach to Opposite Side with Weight Shift  - 1 x daily - 7 x weekly - 3 sets - 10 reps - 2 hold  ASSESSMENT:  CLINICAL IMPRESSION:  Pt arrives highly motivated to participate in therapy session. Reports that she had increased pain/burning throughout entire body yesterday, but has improved today. Neurology has been notified. PT treatment focused on PD management with emphasis on improved posture/scapular mobility. Noted to have improved shoulder RO min to horizontal abduction as well as improved reciprocal arm swing with all gait on this day compared to prior session.    Pt  will benefit from continued skilled PT to address back pain improve strength,  increase endurance, and improve overall fucntional mobility    OBJECTIVE IMPAIRMENTS: Abnormal gait, cardiopulmonary status limiting activity, decreased activity tolerance, decreased balance, decreased endurance, decreased mobility, difficulty walking, decreased ROM, decreased strength, hypomobility, increased fascial restrictions, impaired perceived functional ability, impaired flexibility, impaired UE functional use, improper body mechanics, and postural dysfunction.   ACTIVITY LIMITATIONS: carrying, squatting, sleeping, and locomotion level  PARTICIPATION LIMITATIONS: cleaning, laundry, shopping, and community activity  PERSONAL FACTORS: Age, Fitness, and 1-2 comorbidities: lumbar stenosis   are also affecting patient's functional outcome.   REHAB POTENTIAL: Good  CLINICAL DECISION MAKING: Stable/uncomplicated  EVALUATION COMPLEXITY: Low   GOALS: Goals reviewed with patient? Yes   SHORT TERM GOALS: Target date: 06/09/2023    Patient will be independent in home exercise program to improve strength/mobility for better functional independence with ADLs. Baseline: to be given at session 2.  05/07/2023: need to be re-assessed Goal status: INITIAL   LONG TERM GOALS: Target date: 07/07/2023      Patient will improve Mod ODI score by  12.8 points  to demonstrate statistically significant improvement in mobility and quality of life.  Baseline: 28% 3/12: 26% 05/07/2023: 32% indicating moderate disability Goal status: IN PROGRESS  2.  Patient (> 47 years old) will complete five times sit to stand test in < 15 seconds indicating an increased LE strength and improved balance. Baseline: 16 sec  3/12: 11.81. sec  05/07/2023: 11.09 seconds Goal status: MET Upgraded Goal on 05/07/2023: Patient will increase MiniBest Test score to >18/28 to indicate a reduced risk for falling and demonstrate increased independence with functional mobility and ADLs.  Baseline: need to assess  Goal status:  INITIAL  3.  Patient will increase 6 min walk test score by >150 to demonstrated improved access to community and improve safety.   Baseline: 1136ft 3/12: 1263ft 05/07/2023: 1249ft Goal status: IN PROGRESS  4.  Patient will increase 10 meter walk test to >1.85m/s as to improve gait speed for better community ambulation and to reduce fall risk. Baseline: 0.23m/s  3/12: 1.35m/s 05/07/2023: 1.098 m/s no AD, independently  Goal status: MET  5.  Patient will reduce timed up and go to <11 seconds to reduce fall risk and demonstrate improved transfer/gait ability. Baseline: 13sec  3/12; 12.5sec  05/07/2023: 11.01 seconds no AD, independently Goal status: MET Upgraded Goal on 05/07/2023: Patient will increase Functional Gait Assessment (FGA) score to >20/30 as to reduce fall risk and improve dynamic gait safety with community ambulation. Baseline: need to asess Goal Status: INITIAL   6.  Patient will reduced back pain to 3/10 at worst to indicate improved function with household tasks.  Baseline: 9/10 at night.  3/12: 8/10, when waking up in the morning 05/07/2023: continues to report back pain gets as high as 8/10 when bending forward Goal status: IN PROGRESS  PLAN:  PT FREQUENCY: 1-2x/week  PT DURATION: 12 weeks  PLANNED INTERVENTIONS: 97110-Therapeutic exercises, 97530- Therapeutic activity, 97112- Neuromuscular re-education, 97535- Self Care, 16109- Manual therapy, 97116- Gait training, 97014- Electrical stimulation (unattended), [phone]- Electrical stimulation (manual), Balance training, Stair training, Taping, Dry Needling, Joint mobilization, Joint manipulation, Spinal manipulation, Spinal mobilization, Scar mobilization, DME instructions, Cryotherapy, and Moist heat.  PLAN FOR NEXT SESSION:    Large amplitude movements. For PD deficits.  Continue to address upper back pain.    Aurora Lees PT, DPT  Physical Therapist - [facility]  10:50  AM 05/28/23

## 2023-06-02 ENCOUNTER — Ambulatory Visit: Payer: Medicare Other | Admitting: Physical Therapy

## 2023-06-02 DIAGNOSIS — M6281 Muscle weakness (generalized): Secondary | ICD-10-CM | POA: Diagnosis not present

## 2023-06-02 DIAGNOSIS — R2689 Other abnormalities of gait and mobility: Secondary | ICD-10-CM

## 2023-06-02 DIAGNOSIS — R278 Other lack of coordination: Secondary | ICD-10-CM

## 2023-06-02 DIAGNOSIS — R2681 Unsteadiness on feet: Secondary | ICD-10-CM

## 2023-06-02 DIAGNOSIS — R262 Difficulty in walking, not elsewhere classified: Secondary | ICD-10-CM

## 2023-06-02 DIAGNOSIS — G8929 Other chronic pain: Secondary | ICD-10-CM

## 2023-06-02 DIAGNOSIS — G20A1 Parkinson's disease without dyskinesia, without mention of fluctuations: Secondary | ICD-10-CM

## 2023-06-02 NOTE — Therapy (Signed)
 OUTPATIENT PHYSICAL THERAPY THORACOLUMBAR TREATMENT  Patient Name: Katelyn Crawford MRN: [id number] DOB:12/01/1940, 83 y.o., female Today's Date: 06/02/2023   END OF SESSION:   PT End of Session - 06/02/23 1102     Visit Number 28    Number of Visits 38    Date for PT Re-Evaluation 07/07/23    PT Start Time 1103    PT Stop Time 1145    PT Time Calculation (min) 42 min    Equipment Utilized During Treatment Gait belt    Activity Tolerance Patient tolerated treatment well    Behavior During Therapy WFL for tasks assessed/performed                 Past Medical History:  Diagnosis Date   Anemia    Aortic atherosclerosis (HCC)    Arthritis    Cardiomegaly    Cardiomyopathy (HCC)    Cholelithiasis    Chronic kidney disease, stage 2 (mild)    Chronic sinus bradycardia    Coronary artery disease 03/10/2012   a.) LHC 03/10/2012: 40% mLAD, 50% pRI, 50% pRCA, 90% mRCA, 50% dRCA, 50% RPDA - unable to cannulate RCA - med mgmt; b.) LHC/PCI 06/25/2013: 30% pLAD, 40% mLAD, 20% mLCx, 30% RI, 30% pRCA, 70% mRCA (2.5 mm Promus DES), 40% dRCA-1, 90% dRCA-2 (2.5 mm Promus DES)   DDD (degenerative disc disease), thoracolumbar    Diverticulosis    Dizziness    Environmental allergies    Full dentures    GERD (gastroesophageal reflux disease) 06/25/2013   H/O bilateral cataract extraction 2019   History of diverticulosis    History of kidney stones    HLD (hyperlipidemia)    Hyperlipidemia, unspecified    Hypertension    Nephrolithiasis    Osteoporosis, post-menopausal    Parkinson disease (HCC)    Pre-diabetes    PVC's (premature ventricular contractions)    Scoliosis    Thoracic kyphosis    Trifascicular bundle branch block    a.) 1st degree AVB + RBBB + LAFB   Vascular disease    Past Surgical History:  Procedure Laterality Date   APPENDECTOMY     BREAST EXCISIONAL BIOPSY Left 1984   neg   BREAST EXCISIONAL BIOPSY Right 1997   lipoma   CATARACT EXTRACTION W/PHACO Left  09/02/2017   Procedure: CATARACT EXTRACTION PHACO AND INTRAOCULAR LENS PLACEMENT (IOC)  LEFT;  Surgeon: Annell Kidney, MD;  Location: [facility];  Service: Ophthalmology;  Laterality: Left;   CATARACT EXTRACTION W/PHACO Right 10/01/2017   Procedure: CATARACT EXTRACTION PHACO AND INTRAOCULAR LENS PLACEMENT (IOC) RIGHT;  Surgeon: Annell Kidney, MD;  Location: [facility];  Service: Ophthalmology;  Laterality: Right;   COLONOSCOPY WITH PROPOFOL  N/A 11/27/2016   Procedure: COLONOSCOPY WITH PROPOFOL ;  Surgeon: Toledo, Alphonsus Jeans, MD;  Location: [facility];  Service: Gastroenterology;  Laterality: N/A;   CORONARY ANGIOPLASTY WITH STENT PLACEMENT Left 06/25/2013   Procedure: CORONARY ANGIOPLASTY WITH STENT PLACEMENT; Location: [facility]; Surgeon: Arletta Lager, MD   KNEE ARTHROPLASTY Left 01/22/2017   Procedure: COMPUTER ASSISTED TOTAL KNEE ARTHROPLASTY;  Surgeon: Arlyne Lame, MD;  Location: [facility];  Service: Orthopedics;  Laterality: Left;   KNEE ARTHROSCOPY Left 02/01/2015   Procedure: ARTHROSCOPY KNEE, partial medial & lateral menisectomy,,medial and patelofemoral chondroplasty;  Surgeon: Arlyne Lame, MD;  Location: [facility];  Service: Orthopedics;  Laterality: Left;   LEFT HEART CATH AND CORONARY ANGIOGRAPHY Left 03/10/2012   Procedure: LEFT HEART CATH AND CORONARY ANGIOGRAPHY; Location: [facility]; Surgeon: Percival Brace,  MD   LITHOTRIPSY Right 1995   RIGHT AXILLARY LIPOMA REMOVAL     TUBAL LIGATION     VARICOSE VEIN SURGERY     Patient Active Problem List   Diagnosis Date Noted   Primary osteoarthritis of hip 02/09/2017   Primary osteoarthritis of knee 02/09/2017   Chronic sinus bradycardia 01/22/2017   Degenerative disc disease, lumbar 01/22/2017   Dizziness 01/22/2017   Nephrolithiasis 01/22/2017   Osteoporosis, 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/22/2017   PVC's (premature ventricular contractions) 01/22/2017   S/P total knee arthroplasty 01/22/2017   Trifascicular  bundle branch block 03/02/2015   CKD (chronic kidney disease), 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   GERD (gastroesophageal reflux disease) 06/25/2013   Coronary artery disease involving native coronary artery of native heart 06/24/2013   Essential hypertension 06/21/2013   Mixed hyperlipidemia 06/21/2013    PCP: Yehuda Helms, MD   REFERRING PROVIDER: Meeler, Driscilla George, FNP  REFERRING DIAG:  Diagnosis  [phone] (ICD-10-CM) - Lumbar stenosis with neurogenic claudication  M54.14 (ICD-10-CM) - Thoracic radiculitis    Rationale for Evaluation and Treatment: Rehabilitation  THERAPY DIAG:  Muscle weakness (generalized)  Parkinson's disease, unspecified whether dyskinesia present, unspecified whether manifestations fluctuate (HCC)  Unsteadiness on feet  Difficulty in walking, not elsewhere classified  Other abnormalities of gait and mobility  Chronic bilateral thoracic back pain  Other lack of coordination  ONSET DATE: >3 months   SUBJECTIVE:                                                                                                                                                                                           SUBJECTIVE STATEMENT:   Pt reports that she is doing okay. Spoke with MD. Has been prescribed gabapentin  100mg  for burning pain through body. Has helped improve sleep over the weekend. Was unable to celebrate Mother's day due to son getting sick over the weekend.  Reports that shoulders were hurting her this morning when making breakfast, but has since improved.    PERTINENT HISTORY:  Hx of PD and familiar to this clinic for PD management.  From recent MD appointment:  "The patient is a pleasant 83 year old female who presents today for acute on chronic bilateral low back pain without radiation to the lower extremities. She describes pain that began June 2020 without injury. She also states that she has had some form of back pain for many years. She describes her  pain as mild to moderate that is sharp and intermittent. Bending and lifting increases her pain. Rest can help alleviate her pain. She participated in physical therapy at [facility] in early 2020 with little relief. She continues  to stay active in her home and notices that household activities increase her pain such as vacuuming and mopping. Medications include Tylenol  (mild to moderate relief).  She was last evaluated on 10/09/2022 at which time she was doing well. She was to continue with heat and home cycling along with physical therapy exercises. She was to continue with tramadol  nightly as needed.  At today's visit she reports that she is experience of increasing pain in the morning in her mid back about the bra line. She would like to return to physical therapy and she states that this was very helpful in managing her pain. We discussed if her pain was to continue consideration would be made for another epidural. She states that she is taking tramadol  at night and doing well with this. Denies any injury. Upon palpation today she is without tenderness to the thoracic paraspinal musculature. "  PAIN:  Are you having pain? Yes: NPRS scale: 0/10 Pain location: middle back Pain description: ache Aggravating factors: sleeping Relieving factors: tramadol    PRECAUTIONS: Fall   RED FLAGS: None   WEIGHT BEARING RESTRICTIONS: No  FALLS:  Has patient fallen in last 6 months? No  LIVING ENVIRONMENT: Lives with: lives alone and daughter lives next door. Lives in: House/apartment Stairs: Yes: External: 4 steps; on right going up and on left going up Has following equipment at home:  None, but has use cane in the past. Has shower rails, shower chair; doesn't use  OCCUPATION: retired   PLOF: Independent, Independent with basic ADLs, and Independent with gait  PATIENT GOALS: stronger. Improve mobility   NEXT MD VISIT: unsure   OBJECTIVE:  Note: Objective measures were completed at  Evaluation unless otherwise noted.  DIAGNOSTIC FINDINGS:  No recent imaging   PATIENT SURVEYS:  Modified Oswestry 28%   COGNITION: Overall cognitive status: Within functional limits for tasks assessed     SENSATION: WFL  MUSCLE LENGTH: WFL  POSTURE: rounded shoulders, forward head, and decreased lumbar lordosis  PALPATION: Noted tightness in upper thoracic paraspinal. No pain to palpation   LUMBAR ROM:   AROM eval  Flexion WFL  Extension 20  Right lateral flexion   Left lateral flexion   Right rotation minimal  Left rotation ~10 deg   Cervical  AROM eval  Flexion 45  Extension 18  Right lateral flexion 20  Left lateral flexion 12  Right rotation 50  Left rotation  40  Thoracic  AROM eval  Flexion WFL  Extension 10  Right lateral flexion 25  Left lateral flexion 20  Right rotation minimal  Left rotation ~10 deg      (Blank rows = not tested)  LOWER EXTREMITY ROM:     Grossly WFL  LOWER EXTREMITY MMT:    MMT Right eval Left eval  Hip flexion 4- 4-  Hip extension    Hip abduction 4- 4-  Hip adduction 4 4  Hip internal rotation    Hip external rotation    Knee flexion 4 4  Knee extension 4+ 4+  Ankle dorsiflexion 4 4  Ankle plantarflexion    Ankle inversion    Ankle eversion     (Blank rows = not tested)    FUNCTIONAL TESTS:  5 times sit to stand: 16  Timed up and go (TUG): 12.3 sce 6 minute walk test: 1160ft 10 meter walk test: 0.61m/s Functional gait assessment: 21  4/21: miniBest: 19/28   GAIT: Distance walked: 70 Assistive device utilized: None Level of assistance: Complete  Independence Comments: flexed posture.   TREATMENT DATE: 06/02/2023  Nustep level 1-3 x 7 minutes with cues for consistent SPM through varied resistance.   Sit<>stand with UE swing into extension with stance.  2X10 Seated cross body/lateral reach x 10 bil   Side stepping R and L on airex beam x 5 bil light BUE support on rail Tandem gait on airex  beam LUE support on rail. X 5 each.  Lateral step with hip ER on airex beam x 10 bil with BUE support and x 3 bil without UE support  Forward/reverse gait with no resistance x 6 each, x 52ft each  Reciprocal foot tap on 6inch step no UE support x 15   Supervision/GCA assist throughout session for safety. With cues for posture and step length   PATIENT EDUCATION:  Education details:exercise technique, large-amplitude training/purpose, techniques for donning jacket, finger flicks Person educated: Patient Education method: IT trainer, VC Education comprehension: verbalized understanding, returned demo  HOME EXERCISE PROGRAM: Access Code: [email or web address] URL: [email or web address] Date: 02/20/2023 Prepared by: Aurora Lees  Exercises - Sit to Stand with Arm Swing  - 1 x daily - 7 x weekly - 2 sets - 10 reps - 2 hold - Seated Reach Forward, Up, and To Sides  - 1 x daily - 7 x weekly - 3 sets - 10 reps - Seated Reaching to Side and Across Body  - 1 x daily - 7 x weekly - 3 sets - 10 reps - Standing Row with Anchored Resistance  - 1 x daily - 7 x weekly - 3 sets - 10 reps  Access Code: [facility] URL: [email or web address] Date: 05/28/2023 Prepared by: Aurora Lees  Exercises - Step Sideways with Arms Reaching  - 1 x daily - 7 x weekly - 3 sets - 10 reps - 2 hold - Staggered Stance Weight Shift with Arms Reaching  - 1 x daily - 7 x weekly - 3 sets - 10 reps - 2 hold - Standing Reach to Opposite Side with Weight Shift  - 1 x daily - 7 x weekly - 3 sets - 10 reps - 2 hold  ASSESSMENT:  CLINICAL IMPRESSION:  Pt arrives highly motivated to participate in therapy session. Reports that back pain has reduced since starting gabapentin . Tolerated all treatment well. Cues for posture and step length for dynamic balance tasks on this day.   Pt  will benefit from continued skilled PT to address back pain improve strength, increase endurance, and improve overall  fucntional mobility    OBJECTIVE IMPAIRMENTS: Abnormal gait, cardiopulmonary status limiting activity, decreased activity tolerance, decreased balance, decreased endurance, decreased mobility, difficulty walking, decreased ROM, decreased strength, hypomobility, increased fascial restrictions, impaired perceived functional ability, impaired flexibility, impaired UE functional use, improper body mechanics, and postural dysfunction.   ACTIVITY LIMITATIONS: carrying, squatting, sleeping, and locomotion level  PARTICIPATION LIMITATIONS: cleaning, laundry, shopping, and community activity  PERSONAL FACTORS: Age, Fitness, and 1-2 comorbidities: lumbar stenosis  are also affecting patient's functional outcome.   REHAB POTENTIAL: Good  CLINICAL DECISION MAKING: Stable/uncomplicated  EVALUATION COMPLEXITY: Low   GOALS: Goals reviewed with patient? Yes   SHORT TERM GOALS: Target date: 06/09/2023    Patient will be independent in home exercise program to improve strength/mobility for better functional independence with ADLs. Baseline: to be given at session 2.  05/07/2023: need to be re-assessed Goal status: INITIAL   LONG TERM GOALS: Target date: 07/07/2023      Patient will improve Mod ODI score by 12.8 points  to demonstrate statistically significant improvement in mobility and quality of life.  Baseline: 28% 3/12: 26% 05/07/2023: 32% indicating moderate disability Goal status: IN PROGRESS  2.  Patient (> 26 years old) will complete five times sit to stand test in < 15 seconds indicating an increased LE strength and improved balance. Baseline: 16 sec  3/12: 11.81. sec  05/07/2023: 11.09 seconds Goal status: MET Upgraded Goal on 05/07/2023: Patient will increase MiniBest Test score to >18/28 to indicate a reduced risk for falling and demonstrate increased independence with functional mobility and ADLs.  Baseline: need to assess  Goal status: INITIAL  3.  Patient will increase 6  min walk test score by >150 to demonstrated improved access to community and improve safety.   Baseline: 1148ft 3/12: 1271ft 05/07/2023: 1275ft Goal status: IN PROGRESS  4.  Patient will increase 10 meter walk test to >1.45m/s as to improve gait speed for better community ambulation and to reduce fall risk. Baseline: 0.37m/s  3/12: 1.25m/s 05/07/2023: 1.098 m/s no AD, independently  Goal status: MET  5.  Patient will reduce timed up and go to <11 seconds to reduce fall risk and demonstrate improved transfer/gait ability. Baseline: 13sec  3/12; 12.5sec  05/07/2023: 11.01 seconds no AD, independently Goal status: MET Upgraded Goal on 05/07/2023: Patient will increase Functional Gait Assessment (FGA) score to >20/30 as to reduce fall risk and improve dynamic gait safety with community ambulation. Baseline: need to asess Goal Status: INITIAL   6.  Patient will reduced back pain to 3/10 at worst to indicate improved function with household tasks.  Baseline: 9/10 at night.  3/12: 8/10, when waking up in the morning 05/07/2023: continues to report back pain gets as high as 8/10 when bending forward Goal status: IN PROGRESS  PLAN:  PT FREQUENCY: 1-2x/week  PT DURATION: 12 weeks  PLANNED INTERVENTIONS: 97110-Therapeutic exercises, 97530- Therapeutic activity, 97112- Neuromuscular re-education, 97535- Self Care, [id number]- Manual therapy, 97116- Gait training, 97014- Electrical stimulation (unattended), [phone]- Electrical stimulation (manual), Balance training, Stair training, Taping, Dry Needling, Joint mobilization, Joint manipulation, Spinal manipulation, Spinal mobilization, Scar mobilization, DME instructions, Cryotherapy, and Moist heat.  PLAN FOR NEXT SESSION:    Large amplitude movements. For PD deficits.  Continue to address upper back pain.    Aurora Lees PT, DPT  Physical Therapist - [facility]  11:02 AM 06/02/23

## 2023-06-04 ENCOUNTER — Ambulatory Visit

## 2023-06-04 ENCOUNTER — Ambulatory Visit: Payer: Medicare Other | Admitting: Physical Therapy

## 2023-06-04 DIAGNOSIS — R278 Other lack of coordination: Secondary | ICD-10-CM

## 2023-06-04 DIAGNOSIS — R262 Difficulty in walking, not elsewhere classified: Secondary | ICD-10-CM

## 2023-06-04 DIAGNOSIS — M6281 Muscle weakness (generalized): Secondary | ICD-10-CM | POA: Diagnosis not present

## 2023-06-04 DIAGNOSIS — R471 Dysarthria and anarthria: Secondary | ICD-10-CM

## 2023-06-04 DIAGNOSIS — G20A1 Parkinson's disease without dyskinesia, without mention of fluctuations: Secondary | ICD-10-CM

## 2023-06-04 DIAGNOSIS — R2689 Other abnormalities of gait and mobility: Secondary | ICD-10-CM

## 2023-06-04 DIAGNOSIS — R2681 Unsteadiness on feet: Secondary | ICD-10-CM

## 2023-06-04 DIAGNOSIS — G8929 Other chronic pain: Secondary | ICD-10-CM

## 2023-06-04 NOTE — Therapy (Signed)
 OUTPATIENT PHYSICAL THERAPY THORACOLUMBAR TREATMENT  Patient Name: Katelyn Crawford MRN: [id number] DOB:Jul 23, 1940, 83 y.o., female Today's Date: 06/04/2023   END OF SESSION:   PT End of Session - 06/04/23 0941     Visit Number 29    Number of Visits 38    Date for PT Re-Evaluation 07/07/23    PT Start Time 1020    PT Stop Time 1100    PT Time Calculation (min) 40 min    Equipment Utilized During Treatment Gait belt    Activity Tolerance Patient tolerated treatment well    Behavior During Therapy WFL for tasks assessed/performed                 Past Medical History:  Diagnosis Date   Anemia    Aortic atherosclerosis (HCC)    Arthritis    Cardiomegaly    Cardiomyopathy (HCC)    Cholelithiasis    Chronic kidney disease, stage 2 (mild)    Chronic sinus bradycardia    Coronary artery disease 03/10/2012   a.) LHC 03/10/2012: 40% mLAD, 50% pRI, 50% pRCA, 90% mRCA, 50% dRCA, 50% RPDA - unable to cannulate RCA - med mgmt; b.) LHC/PCI 06/25/2013: 30% pLAD, 40% mLAD, 20% mLCx, 30% RI, 30% pRCA, 70% mRCA (2.5 mm Promus DES), 40% dRCA-1, 90% dRCA-2 (2.5 mm Promus DES)   DDD (degenerative disc disease), thoracolumbar    Diverticulosis    Dizziness    Environmental allergies    Full dentures    GERD (gastroesophageal reflux disease) 06/25/2013   H/O bilateral cataract extraction 2019   History of diverticulosis    History of kidney stones    HLD (hyperlipidemia)    Hyperlipidemia, unspecified    Hypertension    Nephrolithiasis    Osteoporosis, post-menopausal    Parkinson disease (HCC)    Pre-diabetes    PVC's (premature ventricular contractions)    Scoliosis    Thoracic kyphosis    Trifascicular bundle branch block    a.) 1st degree AVB + RBBB + LAFB   Vascular disease    Past Surgical History:  Procedure Laterality Date   APPENDECTOMY     BREAST EXCISIONAL BIOPSY Left 1984   neg   BREAST EXCISIONAL BIOPSY Right 1997   lipoma   CATARACT EXTRACTION W/PHACO Left  09/02/2017   Procedure: CATARACT EXTRACTION PHACO AND INTRAOCULAR LENS PLACEMENT (IOC)  LEFT;  Surgeon: Annell Kidney, MD;  Location: [facility];  Service: Ophthalmology;  Laterality: Left;   CATARACT EXTRACTION W/PHACO Right 10/01/2017   Procedure: CATARACT EXTRACTION PHACO AND INTRAOCULAR LENS PLACEMENT (IOC) RIGHT;  Surgeon: Annell Kidney, MD;  Location: [facility];  Service: Ophthalmology;  Laterality: Right;   COLONOSCOPY WITH PROPOFOL  N/A 11/27/2016   Procedure: COLONOSCOPY WITH PROPOFOL ;  Surgeon: Toledo, Alphonsus Jeans, MD;  Location: [facility];  Service: Gastroenterology;  Laterality: N/A;   CORONARY ANGIOPLASTY WITH STENT PLACEMENT Left 06/25/2013   Procedure: CORONARY ANGIOPLASTY WITH STENT PLACEMENT; Location: [facility]; Surgeon: Arletta Lager, MD   KNEE ARTHROPLASTY Left 01/22/2017   Procedure: COMPUTER ASSISTED TOTAL KNEE ARTHROPLASTY;  Surgeon: Arlyne Lame, MD;  Location: [facility];  Service: Orthopedics;  Laterality: Left;   KNEE ARTHROSCOPY Left 02/01/2015   Procedure: ARTHROSCOPY KNEE, partial medial & lateral menisectomy,,medial and patelofemoral chondroplasty;  Surgeon: Arlyne Lame, MD;  Location: [facility];  Service: Orthopedics;  Laterality: Left;   LEFT HEART CATH AND CORONARY ANGIOGRAPHY Left 03/10/2012   Procedure: LEFT HEART CATH AND CORONARY ANGIOGRAPHY; Location: [facility]; Surgeon: Percival Brace,  MD   LITHOTRIPSY Right 1995   RIGHT AXILLARY LIPOMA REMOVAL     TUBAL LIGATION     VARICOSE VEIN SURGERY     Patient Active Problem List   Diagnosis Date Noted   Primary osteoarthritis of hip 02/09/2017   Primary osteoarthritis of knee 02/09/2017   Chronic sinus bradycardia 01/22/2017   Degenerative disc disease, lumbar 01/22/2017   Dizziness 01/22/2017   Nephrolithiasis 01/22/2017   Osteoporosis, 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/22/2017   PVC's (premature ventricular contractions) 01/22/2017   S/P total knee arthroplasty 01/22/2017   Trifascicular  bundle branch block 03/02/2015   CKD (chronic kidney disease), 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   GERD (gastroesophageal reflux disease) 06/25/2013   Coronary artery disease involving native coronary artery of native heart 06/24/2013   Essential hypertension 06/21/2013   Mixed hyperlipidemia 06/21/2013    PCP: Yehuda Helms, MD   REFERRING PROVIDER: Meeler, Driscilla George, FNP  REFERRING DIAG:  Diagnosis  [phone] (ICD-10-CM) - Lumbar stenosis with neurogenic claudication  M54.14 (ICD-10-CM) - Thoracic radiculitis    Rationale for Evaluation and Treatment: Rehabilitation  THERAPY DIAG:  Muscle weakness (generalized)  Parkinson's disease, unspecified whether dyskinesia present, unspecified whether manifestations fluctuate (HCC)  Unsteadiness on feet  Difficulty in walking, not elsewhere classified  Other abnormalities of gait and mobility  Chronic bilateral thoracic back pain  Other lack of coordination  ONSET DATE: >3 months   SUBJECTIVE:                                                                                                                                                                                           SUBJECTIVE STATEMENT:   Arrives to PT from SLP treatment. Pt reports that she is having a better day. Was able to get a good nights sleep last night. No pain reported at start of PT treatment.     PERTINENT HISTORY:  Hx of PD and familiar to this clinic for PD management.  From recent MD appointment:  "The patient is a pleasant 83 year old female who presents today for acute on chronic bilateral low back pain without radiation to the lower extremities. She describes pain that began June 2020 without injury. She also states that she has had some form of back pain for many years. She describes her pain as mild to moderate that is sharp and intermittent. Bending and lifting increases her pain. Rest can help alleviate her pain. She participated in physical therapy at  [facility] clinic in early 2020 with little relief. She continues to stay active in her home and notices that household activities increase her pain such as vacuuming and mopping. Medications include Tylenol  (mild  to moderate relief).  She was last evaluated on 10/09/2022 at which time she was doing well. She was to continue with heat and home cycling along with physical therapy exercises. She was to continue with tramadol  nightly as needed.  At today's visit she reports that she is experience of increasing pain in the morning in her mid back about the bra line. She would like to return to physical therapy and she states that this was very helpful in managing her pain. We discussed if her pain was to continue consideration would be made for another epidural. She states that she is taking tramadol  at night and doing well with this. Denies any injury. Upon palpation today she is without tenderness to the thoracic paraspinal musculature. "  PAIN:  Are you having pain? Yes: NPRS scale: 0/10 Pain location: middle back Pain description: ache Aggravating factors: sleeping Relieving factors: tramadol    PRECAUTIONS: Fall   RED FLAGS: None   WEIGHT BEARING RESTRICTIONS: No  FALLS:  Has patient fallen in last 6 months? No  LIVING ENVIRONMENT: Lives with: lives alone and daughter lives next door. Lives in: House/apartment Stairs: Yes: External: 4 steps; on right going up and on left going up Has following equipment at home:  None, but has use cane in the past. Has shower rails, shower chair; doesn't use  OCCUPATION: retired   PLOF: Independent, Independent with basic ADLs, and Independent with gait  PATIENT GOALS: stronger. Improve mobility   NEXT MD VISIT: unsure   OBJECTIVE:  Note: Objective measures were completed at Evaluation unless otherwise noted.  DIAGNOSTIC FINDINGS:  No recent imaging   PATIENT SURVEYS:  Modified Oswestry 28%   COGNITION: Overall cognitive status: Within  functional limits for tasks assessed     SENSATION: WFL  MUSCLE LENGTH: WFL  POSTURE: rounded shoulders, forward head, and decreased lumbar lordosis  PALPATION: Noted tightness in upper thoracic paraspinal. No pain to palpation   LUMBAR ROM:   AROM eval  Flexion WFL  Extension 20  Right lateral flexion   Left lateral flexion   Right rotation minimal  Left rotation ~10 deg   Cervical  AROM eval  Flexion 45  Extension 18  Right lateral flexion 20  Left lateral flexion 12  Right rotation 50  Left rotation  40  Thoracic  AROM eval  Flexion WFL  Extension 10  Right lateral flexion 25  Left lateral flexion 20  Right rotation minimal  Left rotation ~10 deg      (Blank rows = not tested)  LOWER EXTREMITY ROM:     Grossly WFL  LOWER EXTREMITY MMT:    MMT Right eval Left eval  Hip flexion 4- 4-  Hip extension    Hip abduction 4- 4-  Hip adduction 4 4  Hip internal rotation    Hip external rotation    Knee flexion 4 4  Knee extension 4+ 4+  Ankle dorsiflexion 4 4  Ankle plantarflexion    Ankle inversion    Ankle eversion     (Blank rows = not tested)    FUNCTIONAL TESTS:  5 times sit to stand: 16  Timed up and go (TUG): 12.3 sce 6 minute walk test: 1157ft 10 meter walk test: 0.55m/s Functional gait assessment: 21  4/21: miniBest: 19/28   GAIT: Distance walked: 70 Assistive device utilized: None Level of assistance: Complete Independence Comments: flexed posture.   TREATMENT DATE: 06/04/2023  TE:  Hip flexion x 12 bil 3# AW  Hip extension x  12 3# AW Shoulder ER YTB x 12  Low row RTB x 12  Calf raise x 15 3# AW   TA: for improved trunkal ROM and large amplitude movement to improve independence with ADLs  Nustep level 1-4 intervalresistance x 7 minutes with cues for consistent SPM through varied resistance. And improved symmetrical trunk rotation Trunk rotation to clap contralateral hand x 10 bil  Forward Step over line in floor with  contralateral UE swing. X 12 bil  Lateral step over line with ipsilateral reach x 12  Weighted gait x 574ft with supervision assist from PT for safety. 3# AW bil  HS curls at rail x 12, 3# AW Partial squat with UE support x 12 Supervision/GCA assist throughout session for safety. With cues for posture and step length   PATIENT EDUCATION:  Education details:exercise technique, large-amplitude training/purpose, techniques for donning jacket, finger flicks Person educated: Patient Education method: IT trainer, VC Education comprehension: verbalized understanding, returned demo  HOME EXERCISE PROGRAM: Access Code: [email or web address] URL: [email or web address] Date: 02/20/2023 Prepared by: Aurora Lees  Exercises - Sit to Stand with Arm Swing  - 1 x daily - 7 x weekly - 2 sets - 10 reps - 2 hold - Seated Reach Forward, Up, and To Sides  - 1 x daily - 7 x weekly - 3 sets - 10 reps - Seated Reaching to Side and Across Body  - 1 x daily - 7 x weekly - 3 sets - 10 reps - Standing Row with Anchored Resistance  - 1 x daily - 7 x weekly - 3 sets - 10 reps  Access Code: [facility] URL: [email or web address] Date: 05/28/2023 Prepared by: Aurora Lees  Exercises - Step Sideways with Arms Reaching  - 1 x daily - 7 x weekly - 3 sets - 10 reps - 2 hold - Staggered Stance Weight Shift with Arms Reaching  - 1 x daily - 7 x weekly - 3 sets - 10 reps - 2 hold - Standing Reach to Opposite Side with Weight Shift  - 1 x daily - 7 x weekly - 3 sets - 10 reps - 2 hold  ASSESSMENT:  CLINICAL IMPRESSION:  Pt arrives highly motivated to participate in therapy session. Pt reports that her back is feeling better today, allowing increased attention to improve movement patterns and BUE/BLE strengthening. Occasional rest break due to mild SOB and need for hydration.  Pt  will benefit from continued skilled PT to address back pain improve strength, increase endurance, and improve overall  fucntional mobility    OBJECTIVE IMPAIRMENTS: Abnormal gait, cardiopulmonary status limiting activity, decreased activity tolerance, decreased balance, decreased endurance, decreased mobility, difficulty walking, decreased ROM, decreased strength, hypomobility, increased fascial restrictions, impaired perceived functional ability, impaired flexibility, impaired UE functional use, improper body mechanics, and postural dysfunction.   ACTIVITY LIMITATIONS: carrying, squatting, sleeping, and locomotion level  PARTICIPATION LIMITATIONS: cleaning, laundry, shopping, and community activity  PERSONAL FACTORS: Age, Fitness, and 1-2 comorbidities: lumbar stenosis  are also affecting patient's functional outcome.   REHAB POTENTIAL: Good  CLINICAL DECISION MAKING: Stable/uncomplicated  EVALUATION COMPLEXITY: Low   GOALS: Goals reviewed with patient? Yes   SHORT TERM GOALS: Target date: 06/09/2023    Patient will be independent in home exercise program to improve strength/mobility for better functional independence with ADLs. Baseline: to be given at session 2.  05/07/2023: need to be re-assessed Goal status: INITIAL   LONG TERM GOALS: Target date: 07/07/2023      Patient will improve Mod ODI  score by 12.8 points  to demonstrate statistically significant improvement in mobility and quality of life.  Baseline: 28% 3/12: 26% 05/07/2023: 32% indicating moderate disability Goal status: IN PROGRESS  2.  Patient (> 15 years old) will complete five times sit to stand test in < 15 seconds indicating an increased LE strength and improved balance. Baseline: 16 sec  3/12: 11.81. sec  05/07/2023: 11.09 seconds Goal status: MET  Upgraded Goal on 05/07/2023: Patient will increase MiniBest Test score to >18/28 to indicate a reduced risk for falling and demonstrate increased independence with functional mobility and ADLs.  Baseline: need to assess  Goal status: INITIAL  3.  Patient will increase 6  min walk test score by >150 to demonstrated improved access to community and improve safety.   Baseline: 1149ft 3/12: 1282ft 05/07/2023: 1232ft Goal status: IN PROGRESS  4.  Patient will increase 10 meter walk test to >1.30m/s as to improve gait speed for better community ambulation and to reduce fall risk. Baseline: 0.22m/s  3/12: 1.54m/s 05/07/2023: 1.098 m/s no AD, independently  Goal status: MET  5.  Patient will reduce timed up and go to <11 seconds to reduce fall risk and demonstrate improved transfer/gait ability. Baseline: 13sec  3/12; 12.5sec  05/07/2023: 11.01 seconds no AD, independently Goal status: MET Upgraded Goal on 05/07/2023: Patient will increase Functional Gait Assessment (FGA) score to >20/30 as to reduce fall risk and improve dynamic gait safety with community ambulation. Baseline: need to asess Goal Status: INITIAL   6.  Patient will reduced back pain to 3/10 at worst to indicate improved function with household tasks.  Baseline: 9/10 at night.  3/12: 8/10, when waking up in the morning 05/07/2023: continues to report back pain gets as high as 8/10 when bending forward Goal status: IN PROGRESS  PLAN:  PT FREQUENCY: 1-2x/week  PT DURATION: 12 weeks  PLANNED INTERVENTIONS: 97110-Therapeutic exercises, 97530- Therapeutic activity, 97112- Neuromuscular re-education, 97535- Self Care, 16109- Manual therapy, 97116- Gait training, 97014- Electrical stimulation (unattended), [phone]- Electrical stimulation (manual), Balance training, Stair training, Taping, Dry Needling, Joint mobilization, Joint manipulation, Spinal manipulation, Spinal mobilization, Scar mobilization, DME instructions, Cryotherapy, and Moist heat.  PLAN FOR NEXT SESSION:    Large amplitude movements. For PD deficits.  Continue to address upper back pain.    Aurora Lees PT, DPT  Physical Therapist - [place]  [facility]  10:22 AM 06/04/23

## 2023-06-04 NOTE — Therapy (Signed)
 OUTPATIENT SPEECH LANGUAGE PATHOLOGY PARKINSON'S TREATMENT   Patient Name: Katelyn Crawford MRN: [id number] DOB:[birth year], 83 y.o., female Today's Date: 06/04/2023  PCP: Shary Deems, MD REFERRING PROVIDER: Devora Folks, MD   End of Session - 06/04/23 1241     Visit Number 3    Number of Visits 24    Date for SLP Re-Evaluation 08/04/23    SLP Start Time 0930    SLP Stop Time  1015    SLP Time Calculation (min) 45 min    Activity Tolerance Patient tolerated treatment well             Past Medical History:  Diagnosis Date   Anemia    Aortic atherosclerosis (HCC)    Arthritis    Cardiomegaly    Cardiomyopathy (HCC)    Cholelithiasis    Chronic kidney disease, stage 2 (mild)    Chronic sinus bradycardia    Coronary artery disease 03/10/2012   a.) LHC 03/10/2012: 40% mLAD, 50% pRI, 50% pRCA, 90% mRCA, 50% dRCA, 50% RPDA - unable to cannulate RCA - med mgmt; b.) LHC/PCI 06/25/2013: 30% pLAD, 40% mLAD, 20% mLCx, 30% RI, 30% pRCA, 70% mRCA (2.5 mm Promus DES), 40% dRCA-1, 90% dRCA-2 (2.5 mm Promus DES)   DDD (degenerative disc disease), thoracolumbar    Diverticulosis    Dizziness    Environmental allergies    Full dentures    GERD (gastroesophageal reflux disease) 06/25/2013   H/O bilateral cataract extraction 2019   History of diverticulosis    History of kidney stones    HLD (hyperlipidemia)    Hyperlipidemia, unspecified    Hypertension    Nephrolithiasis    Osteoporosis, post-menopausal    Parkinson disease (HCC)    Pre-diabetes    PVC's (premature ventricular contractions)    Scoliosis    Thoracic kyphosis    Trifascicular bundle branch block    a.) 1st degree AVB + RBBB + LAFB   Vascular disease    Past Surgical History:  Procedure Laterality Date   APPENDECTOMY     BREAST EXCISIONAL BIOPSY Left 1984   neg   BREAST EXCISIONAL BIOPSY Right 1997   lipoma   CATARACT EXTRACTION W/PHACO Left 09/02/2017   Procedure: CATARACT EXTRACTION PHACO AND INTRAOCULAR  LENS PLACEMENT (IOC)  LEFT;  Surgeon: Annell Kidney, MD;  Location: [facility];  Service: Ophthalmology;  Laterality: Left;   CATARACT EXTRACTION W/PHACO Right 10/01/2017   Procedure: CATARACT EXTRACTION PHACO AND INTRAOCULAR LENS PLACEMENT (IOC) RIGHT;  Surgeon: Annell Kidney, MD;  Location: [facility];  Service: Ophthalmology;  Laterality: Right;   COLONOSCOPY WITH PROPOFOL  N/A 11/27/2016   Procedure: COLONOSCOPY WITH PROPOFOL ;  Surgeon: Toledo, Alphonsus Jeans, MD;  Location: [facility];  Service: Gastroenterology;  Laterality: N/A;   CORONARY ANGIOPLASTY WITH STENT PLACEMENT Left 06/25/2013   Procedure: CORONARY ANGIOPLASTY WITH STENT PLACEMENT; Location: [facility]; Surgeon: Arletta Lager, MD   KNEE ARTHROPLASTY Left 01/22/2017   Procedure: COMPUTER ASSISTED TOTAL KNEE ARTHROPLASTY;  Surgeon: Arlyne Lame, MD;  Location: [facility];  Service: Orthopedics;  Laterality: Left;   KNEE ARTHROSCOPY Left 02/01/2015   Procedure: ARTHROSCOPY KNEE, partial medial & lateral menisectomy,,medial and patelofemoral chondroplasty;  Surgeon: Arlyne Lame, MD;  Location: [facility];  Service: Orthopedics;  Laterality: Left;   LEFT HEART CATH AND CORONARY ANGIOGRAPHY Left 03/10/2012   Procedure: LEFT HEART CATH AND CORONARY ANGIOGRAPHY; Location: [facility]; Surgeon: Percival Brace, MD   LITHOTRIPSY Right 1995   RIGHT AXILLARY LIPOMA REMOVAL  TUBAL LIGATION     VARICOSE VEIN SURGERY     Patient Active Problem List   Diagnosis Date Noted   Primary osteoarthritis of hip 02/09/2017   Primary osteoarthritis of knee 02/09/2017   Chronic sinus bradycardia 01/22/2017   Degenerative disc disease, lumbar 01/22/2017   Dizziness 01/22/2017   Nephrolithiasis 01/22/2017   Osteoporosis, 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/22/2017   PVC's (premature ventricular contractions) 01/22/2017   S/P total knee arthroplasty 01/22/2017   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  Pt accompanied by: self  PERTINENT HISTORY: Pt is an 83 y.o. 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 Pt with PMHx GERD, HTN, CAD, and osteoathritis. Per chart review, pt previously participated in LSVT in 2023. Noted, MBS on 04/12/20 with findings of mild oral dysphagia. Regular diet and thin liquids was recommended at that time.   DIAGNOSTIC FINDINGS: MRI brain, 10/26/22, "No acute finding by MRI. Moderate chronic small-vessel ischemic changes of the cerebral hemispheric white matter, which is considerably progressive when compared to the study of 2019."  PAIN:  Are you having pain? No  FALLS: Has patient fallen in last 6 months?  See PT evaluation for details  LIVING ENVIRONMENT: Lives with: lives alone; daughter lives next door   PLOF:  Level of assistance: Independent with ADLs Employment: Retired  PATIENT GOALS    for family to hear me  OBJECTIVE:  TODAY'S TREATMENT:  Pt seen for intensity based Parkinson's treatment. Reviewed HEP. Voice ex's completed as follows: Sustained /a/: x10, 85dB Ascending pitch glides: x10, 84dB Descending pitch glides: x10, 84db   Pt read x10 functional phrases twice through with loud and intentional voice, averaging 83 dB.    During 5 minute speech sample, pt averaged 77dB.   Pt benefited from use of biofeeback using Voice Analyst app.    Pt educated re: changes  to speech/voice due to Parkinson's Disease, voice ex's, SLP POC, and HEP.   PATIENT EDUCATION: Education details: as above Person educated: Patient Education method: Explanation Education comprehension: verbalized understanding and needs further education   HOME EXERCISE PROGRAM: As above (vocal ex's, reading)   GOALS: Goals reviewed with patient? Yes  SHORT TERM GOALS: Target date: 10 sessions  Pt will participate in further cognitive-linguistic assessment with additional goals, as appropriate. Baseline: Goal status: INITIAL  2.  The patient will complete Daily Tasks (Maximum duration "ah", High/Lows, and Functional Phrases) at average loudness >/= 80 dB and with loud, good quality voice with min cues.  Baseline:  Goal status: INITIAL  3.  The patient will complete Hierarchal Speech Loudness reading drills (words/phrases, sentences) at average >/= 75 dB and with loud, good quality voice with min cues.  Baseline:  Goal status: INITIAL  4.  The patient will participate in 5-8 minutes conversation, maintaining average loudness of 75 dB and good quality voice with modified independence.  Baseline:  Goal status: INITIAL    LONG TERM GOALS: Target date: 12 weeks  The patient will complete Daily  Tasks (Maximum duration "ah", High/Lows, and Functional Phrases) at average loudness >/= 80 dB and with loud, good quality voice.  Baseline:  Goal status: INITIAL  2.  The patient will complete Hierarchal Speech Loudness reading drills (words/phrases, sentences, and paragraph) at average >/= 75 dB and with loud, good quality voice.  Baseline:  Goal status: INITIAL  3.  The patient will participate in 20 minutes conversation, maintaining average loudness of >/= 75 dB and loud, good quality voice.  Baseline:  Goal status: INITIAL  4.  The patient will report improved communication effectiveness as measured by the Communication Effectiveness Survey.  Baseline: 05/12/23: 12/32 Goal  status: INITIAL    ASSESSMENT:  CLINICAL IMPRESSION: Pt is an 83 y.o. 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 Pt with PMHx GERD, HTN, CAD, and osteoathritis. Per chart review, pt previously participated in LSVT in 2023. Recent assessment completed via informal means and PROM. 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 Patient reports requests to repeat herself from friends and family, and has noticed a decline in her vocal quality over several years. Patient did exhibit some strain during phonation tasks with stimulability testing, but was stimulable for improved loudness, pitch variation and vocal quality in reading and conversation using model and cues for "Loud, like your talking to a crowd." See details of tx session above. Recommend course of intensity based ST to maximize pt's vocal quality and intelligibility for conversations with family and friends to reduce social isolation and improve quality of life.  OBJECTIVE IMPAIRMENTS include dysarthria. These impairments are limiting patient from effectively communicating at home and in community. Factors affecting potential to achieve goals and functional outcome are co-morbidities. Patient will benefit from skilled SLP services to address above impairments and improve overall function.  REHAB POTENTIAL: Good  PLAN: SLP FREQUENCY: 2x/week  SLP DURATION: 12 weeks  PLANNED INTERVENTIONS: Cueing hierachy, Internal/external aids, Functional tasks, SLP instruction and feedback, Compensatory strategies, and Patient/family education   Dia Forget, M.S., CCC-SLP Speech-Language Pathologist [facility] [phone] [place])   [facility] at [facility] [address] [place], [place], [address] Phone: [phone]   Fax:  [phone]

## 2023-06-09 ENCOUNTER — Ambulatory Visit

## 2023-06-09 ENCOUNTER — Ambulatory Visit: Payer: Medicare Other | Admitting: Physical Therapy

## 2023-06-09 DIAGNOSIS — R262 Difficulty in walking, not elsewhere classified: Secondary | ICD-10-CM

## 2023-06-09 DIAGNOSIS — R471 Dysarthria and anarthria: Secondary | ICD-10-CM

## 2023-06-09 DIAGNOSIS — M6281 Muscle weakness (generalized): Secondary | ICD-10-CM

## 2023-06-09 DIAGNOSIS — G8929 Other chronic pain: Secondary | ICD-10-CM

## 2023-06-09 DIAGNOSIS — R2681 Unsteadiness on feet: Secondary | ICD-10-CM

## 2023-06-09 DIAGNOSIS — G20A1 Parkinson's disease without dyskinesia, without mention of fluctuations: Secondary | ICD-10-CM

## 2023-06-09 DIAGNOSIS — R2689 Other abnormalities of gait and mobility: Secondary | ICD-10-CM

## 2023-06-09 NOTE — Therapy (Signed)
 OUTPATIENT PHYSICAL THERAPY THORACOLUMBAR TREATMENT/  PHYSICAL THERAPY PROGRESS NOTE   Dates of reporting period  05/05/23   to   06/09/2023    Patient Name: Katelyn Crawford MRN: [id number] DOB:05/23/1940, 83 y.o., female Today's Date: 06/09/2023   END OF SESSION:   PT End of Session - 06/09/23 1025     Visit Number 30    Number of Visits 38    Date for PT Re-Evaluation 07/07/23    PT Start Time 1020    PT Stop Time 1100    PT Time Calculation (min) 40 min    Equipment Utilized During Treatment Gait belt    Activity Tolerance Patient tolerated treatment well    Behavior During Therapy WFL for tasks assessed/performed                 Past Medical History:  Diagnosis Date   Anemia    Aortic atherosclerosis (HCC)    Arthritis    Cardiomegaly    Cardiomyopathy (HCC)    Cholelithiasis    Chronic kidney disease, stage 2 (mild)    Chronic sinus bradycardia    Coronary artery disease 03/10/2012   a.) LHC 03/10/2012: 40% mLAD, 50% pRI, 50% pRCA, 90% mRCA, 50% dRCA, 50% RPDA - unable to cannulate RCA - med mgmt; b.) LHC/PCI 06/25/2013: 30% pLAD, 40% mLAD, 20% mLCx, 30% RI, 30% pRCA, 70% mRCA (2.5 mm Promus DES), 40% dRCA-1, 90% dRCA-2 (2.5 mm Promus DES)   DDD (degenerative disc disease), thoracolumbar    Diverticulosis    Dizziness    Environmental allergies    Full dentures    GERD (gastroesophageal reflux disease) 06/25/2013   H/O bilateral cataract extraction 2019   History of diverticulosis    History of kidney stones    HLD (hyperlipidemia)    Hyperlipidemia, unspecified    Hypertension    Nephrolithiasis    Osteoporosis, post-menopausal    Parkinson disease (HCC)    Pre-diabetes    PVC's (premature ventricular contractions)    Scoliosis    Thoracic kyphosis    Trifascicular bundle branch block    a.) 1st degree AVB + RBBB + LAFB   Vascular disease    Past Surgical History:  Procedure Laterality Date   APPENDECTOMY     BREAST EXCISIONAL BIOPSY Left 1984    neg   BREAST EXCISIONAL BIOPSY Right 1997   lipoma   CATARACT EXTRACTION W/PHACO Left 09/02/2017   Procedure: CATARACT EXTRACTION PHACO AND INTRAOCULAR LENS PLACEMENT (IOC)  LEFT;  Surgeon: Annell Kidney, MD;  Location: [facility];  Service: Ophthalmology;  Laterality: Left;   CATARACT EXTRACTION W/PHACO Right 10/01/2017   Procedure: CATARACT EXTRACTION PHACO AND INTRAOCULAR LENS PLACEMENT (IOC) RIGHT;  Surgeon: Annell Kidney, MD;  Location: [facility];  Service: Ophthalmology;  Laterality: Right;   COLONOSCOPY WITH PROPOFOL  N/A 11/27/2016   Procedure: COLONOSCOPY WITH PROPOFOL ;  Surgeon: Toledo, Alphonsus Jeans, MD;  Location: [facility];  Service: Gastroenterology;  Laterality: N/A;   CORONARY ANGIOPLASTY WITH STENT PLACEMENT Left 06/25/2013   Procedure: CORONARY ANGIOPLASTY WITH STENT PLACEMENT; Location: [facility]; Surgeon: Arletta Lager, MD   KNEE ARTHROPLASTY Left 01/22/2017   Procedure: COMPUTER ASSISTED TOTAL KNEE ARTHROPLASTY;  Surgeon: Arlyne Lame, MD;  Location: [facility];  Service: Orthopedics;  Laterality: Left;   KNEE ARTHROSCOPY Left 02/01/2015   Procedure: ARTHROSCOPY KNEE, partial medial & lateral menisectomy,,medial and patelofemoral chondroplasty;  Surgeon: Arlyne Lame, MD;  Location: [facility];  Service: Orthopedics;  Laterality: Left;   LEFT  HEART CATH AND CORONARY ANGIOGRAPHY Left 03/10/2012   Procedure: LEFT HEART CATH AND CORONARY ANGIOGRAPHY; Location: [facility]; Surgeon: Percival Brace, MD   LITHOTRIPSY Right 1995   RIGHT AXILLARY LIPOMA REMOVAL     TUBAL LIGATION     VARICOSE VEIN SURGERY     Patient Active Problem List   Diagnosis Date Noted   Primary osteoarthritis of hip 02/09/2017   Primary osteoarthritis of knee 02/09/2017   Chronic sinus bradycardia 01/22/2017   Degenerative disc disease, lumbar 01/22/2017   Dizziness 01/22/2017   Nephrolithiasis 01/22/2017   Osteoporosis, 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/22/2017   PVC's (premature  ventricular contractions) 01/22/2017   S/P total knee arthroplasty 01/22/2017   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   CKD (chronic kidney disease), 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   GERD (gastroesophageal reflux disease) 06/25/2013   Coronary artery disease involving native coronary artery of native heart 06/24/2013   Essential hypertension 06/21/2013   Mixed hyperlipidemia 06/21/2013    PCP: Yehuda Helms, MD   REFERRING PROVIDER: Meeler, Driscilla George, FNP  REFERRING DIAG:  Diagnosis  [phone] (ICD-10-CM) - Lumbar stenosis with neurogenic claudication  M54.14 (ICD-10-CM) - Thoracic radiculitis    Rationale for Evaluation and Treatment: Rehabilitation  THERAPY DIAG:  Muscle weakness (generalized)  Parkinson's disease, unspecified whether dyskinesia present, unspecified whether manifestations fluctuate (HCC)  Unsteadiness on feet  Other abnormalities of gait and mobility  Difficulty in walking, not elsewhere classified  Chronic bilateral thoracic back pain  ONSET DATE: >3 months   SUBJECTIVE:                                                                                                                                                                                           SUBJECTIVE STATEMENT:   Arrives to PT motivated to participate.  Reports that she was a little sore on Saturday, but improved with movement and running errands with daughter. States that she woke up feeling good this AM.    PERTINENT HISTORY:  Hx of PD and familiar to this clinic for PD management.  From recent MD appointment:  "The patient is a pleasant 83 year old female who presents today for acute on chronic bilateral low back pain without radiation to the lower extremities. She describes pain that began June 2020 without injury. She also states that she has had some form of back pain for many years. She describes her pain as mild to moderate that is sharp and intermittent. Bending and lifting  increases her pain. Rest can help alleviate her pain. She participated in physical therapy at [facility] clinic in early 2020 with little relief. She continues to stay active in her home and  notices that household activities increase her pain such as vacuuming and mopping. Medications include Tylenol  (mild to moderate relief).  She was last evaluated on 10/09/2022 at which time she was doing well. She was to continue with heat and home cycling along with physical therapy exercises. She was to continue with tramadol  nightly as needed.  At today's visit she reports that she is experience of increasing pain in the morning in her mid back about the bra line. She would like to return to physical therapy and she states that this was very helpful in managing her pain. We discussed if her pain was to continue consideration would be made for another epidural. She states that she is taking tramadol  at night and doing well with this. Denies any injury. Upon palpation today she is without tenderness to the thoracic paraspinal musculature. "  PAIN:  Are you having pain? Yes: NPRS scale: 0/10 Pain location: middle back Pain description: ache Aggravating factors: sleeping Relieving factors: tramadol    PRECAUTIONS: Fall   RED FLAGS: None   WEIGHT BEARING RESTRICTIONS: No  FALLS:  Has patient fallen in last 6 months? No  LIVING ENVIRONMENT: Lives with: lives alone and daughter lives next door. Lives in: House/apartment Stairs: Yes: External: 4 steps; on right going up and on left going up Has following equipment at home:  None, but has use cane in the past. Has shower rails, shower chair; doesn't use  OCCUPATION: retired   PLOF: Independent, Independent with basic ADLs, and Independent with gait  PATIENT GOALS: stronger. Improve mobility   NEXT MD VISIT: unsure   OBJECTIVE:  Note: Objective measures were completed at Evaluation unless otherwise noted.  DIAGNOSTIC FINDINGS:  No recent imaging    PATIENT SURVEYS:  Modified Oswestry 28%   COGNITION: Overall cognitive status: Within functional limits for tasks assessed     SENSATION: WFL  MUSCLE LENGTH: WFL  POSTURE: rounded shoulders, forward head, and decreased lumbar lordosis  PALPATION: Noted tightness in upper thoracic paraspinal. No pain to palpation   LUMBAR ROM:   AROM eval  Flexion WFL  Extension 20  Right lateral flexion   Left lateral flexion   Right rotation minimal  Left rotation ~10 deg   Cervical  AROM eval  Flexion 45  Extension 18  Right lateral flexion 20  Left lateral flexion 12  Right rotation 50  Left rotation  40  Thoracic  AROM eval  Flexion WFL  Extension 10  Right lateral flexion 25  Left lateral flexion 20  Right rotation minimal  Left rotation ~10 deg      (Blank rows = not tested)  LOWER EXTREMITY ROM:     Grossly WFL  LOWER EXTREMITY MMT:    MMT Right eval Left eval  Hip flexion 4- 4-  Hip extension    Hip abduction 4- 4-  Hip adduction 4 4  Hip internal rotation    Hip external rotation    Knee flexion 4 4  Knee extension 4+ 4+  Ankle dorsiflexion 4 4  Ankle plantarflexion    Ankle inversion    Ankle eversion     (Blank rows = not tested)    FUNCTIONAL TESTS:  5 times sit to stand: 16  Timed up and go (TUG): 12.3 sce 6 minute walk test: 1139ft 10 meter walk test: 0.69m/s Functional gait assessment: 21  4/21: miniBest: 19/28   GAIT: Distance walked: 70 Assistive device utilized: None Level of assistance: Complete Independence Comments: flexed posture.   TREATMENT  DATE: 06/09/2023  Nustep reciprocal movement training x6 min level 1-4 with 1 min cool down at level 1 to  complete only minimal SOB upon completion noted by pt.   PT instructed pt in goal assessment for progress note.   Pt performed 5 time sit<>stand (5xSTS): 10.41 sec (>15 sec indicates increased fall risk)      6 Min Walk Test:  Instructed patient to ambulate as quickly and  as safely as possible for 6 minutes using LRAD. Patient was allowed to take standing rest breaks without stopping the test, but if the patient required a sitting rest break the clock would be stopped and the test would be over.  Results: 5/19: 1166ft using no AD with supervision assist. Results indicate that the patient has reduced endurance with ambulation compared to age matched norms.  Moderate SOB.  Age Matched Norms: 39-69 yo M: 58 F: 11, 85-79 yo M: 62 F: 471, 56-89 yo M: 417 F: 392 MDC: 58.21 meters (190.98 feet) or 50 meters (ANPTA Core Set of Outcome Measures for Adults with Neurologic Conditions, 2018)  Patient demonstrates increased fall risk as noted by score of 22/30 on  Functional Gait Assessment.   <22/30 = predictive of falls, <20/30 = fall in 6 months, <18/30 = predictive of falls in PD MCID: 5 points stroke population, 4 points geriatric population (ANPTA Core Set of Outcome Measures for Adults with Neurologic Conditions, 2018)  Pt completed Mod ODI. See below for details.   PATIENT EDUCATION:  Education details:exercise technique, large-amplitude training/purpose, techniques for donning jacket, finger flicks Person educated: Patient Education method: IT trainer, VC Education comprehension: verbalized understanding, returned demo  HOME EXERCISE PROGRAM: Access Code: [email or web address] URL: [email or web address] Date: 02/20/2023 Prepared by: Aurora Lees  Exercises - Sit to Stand with Arm Swing  - 1 x daily - 7 x weekly - 2 sets - 10 reps - 2 hold - Seated Reach Forward, Up, and To Sides  - 1 x daily - 7 x weekly - 3 sets - 10 reps - Seated Reaching to Side and Across Body  - 1 x daily - 7 x weekly - 3 sets - 10 reps - Standing Row with Anchored Resistance  - 1 x daily - 7 x weekly - 3 sets - 10 reps  Access Code: [facility] URL: [email or web address] Date: 05/28/2023 Prepared by: Aurora Lees  Exercises - Step Sideways with Arms  Reaching  - 1 x daily - 7 x weekly - 3 sets - 10 reps - 2 hold - Staggered Stance Weight Shift with Arms Reaching  - 1 x daily - 7 x weekly - 3 sets - 10 reps - 2 hold - Standing Reach to Opposite Side with Weight Shift  - 1 x daily - 7 x weekly - 3 sets - 10 reps - 2 hold  ASSESSMENT:  CLINICAL IMPRESSION:  Pt arrives highly motivated to participate in therapy session. Pt reports that her back is feeling better today. PT instructed pt in goal assessment for progress note. Noted to have improved balance with decrease 5x STS, mild improvement in FGA, as well as improved function noted on Mod ODI. Patient's condition has the potential to improve in response to therapy. Maximum improvement is yet to be obtained. The anticipated improvement is attainable and reasonable in a generally predictable time. Pt  will benefit from continued skilled PT to address back pain improve strength, increase endurance, and improve overall fucntional mobility    OBJECTIVE IMPAIRMENTS: Abnormal gait, cardiopulmonary status limiting  activity, decreased activity tolerance, decreased balance, decreased endurance, decreased mobility, difficulty walking, decreased ROM, decreased strength, hypomobility, increased fascial restrictions, impaired perceived functional ability, impaired flexibility, impaired UE functional use, improper body mechanics, and postural dysfunction.   ACTIVITY LIMITATIONS: carrying, squatting, sleeping, and locomotion level  PARTICIPATION LIMITATIONS: cleaning, laundry, shopping, and community activity  PERSONAL FACTORS: Age, Fitness, and 1-2 comorbidities: lumbar stenosis  are also affecting patient's functional outcome.   REHAB POTENTIAL: Good  CLINICAL DECISION MAKING: Stable/uncomplicated  EVALUATION COMPLEXITY: Low   GOALS: Goals reviewed with patient? Yes   SHORT TERM GOALS: Target date: 06/09/2023    Patient will be independent in home exercise program to improve strength/mobility  for better functional independence with ADLs. Baseline: to be given at session 2.  05/07/2023: need to be re-assessed Goal status: INITIAL   LONG TERM GOALS: Target date: 07/07/2023      Patient will improve Mod ODI score by 12.8 points  to demonstrate statistically significant improvement in mobility and quality of life.  Baseline: 28% 3/12: 26% 05/07/2023: 32% indicating moderate disability 5/19/: 24% Goal status: IN PROGRESS  2.  Patient (> 63 years old) will complete five times sit to stand test in < 15 seconds indicating an increased LE strength and improved balance. Baseline: 16 sec  3/12: 11.81. sec  05/07/2023: 11.09 seconds 5/19: 10.41 sec no UE support  Goal status: MET  Upgraded Goal on 05/07/2023: Patient will increase MiniBest Test score to >18/28 to indicate a reduced risk for falling and demonstrate increased independence with functional mobility and ADLs.  Baseline: need to assess  Goal status: INITIAL  3.  Patient will increase 6 min walk test score by >150 to demonstrated improved access to community and improve safety.   Baseline: 1138ft 3/12: 1247ft 05/07/2023: 1226ft 5/19: 117ft  Goal status: IN PROGRESS  4.  Patient will increase 10 meter walk test to >1.67m/s as to improve gait speed for better community ambulation and to reduce fall risk. Baseline: 0.24m/s  3/12: 1.63m/s 05/07/2023: 1.098 m/s no AD, independently  Goal status: MET  5.  Patient will reduce timed up and go to <11 seconds to reduce fall risk and demonstrate improved transfer/gait ability. Baseline: 13sec  3/12; 12.5sec  05/07/2023: 11.01 seconds no AD, independently Goal status: MET  Upgraded Goal on 05/07/2023: Patient will increase Functional Gait Assessment (FGA) score to >20/30 as to reduce fall risk and improve dynamic gait safety with community ambulation. Baseline: 5/19: 22 Goal Status: INITIAL   6.  Patient will reduced back pain to 3/10 at worst to indicate improved function  with household tasks.  Baseline: 9/10 at night.  3/12: 8/10, when waking up in the morning 05/07/2023: continues to report back pain gets as high as 8/10 when bending forward 5/19:  Goal status: IN PROGRESS  PLAN:  PT FREQUENCY: 1-2x/week  PT DURATION: 12 weeks  PLANNED INTERVENTIONS: 97110-Therapeutic exercises, 97530- Therapeutic activity, 97112- Neuromuscular re-education, 97535- Self Care, 56433- Manual therapy, 97116- Gait training, 97014- Electrical stimulation (unattended), [phone]- Electrical stimulation (manual), Balance training, Stair training, Taping, Dry Needling, Joint mobilization, Joint manipulation, Spinal manipulation, Spinal mobilization, Scar mobilization, DME instructions, Cryotherapy, and Moist heat.  PLAN FOR NEXT SESSION:   MiniBest.   Continued to manage Back pain and PD deficits.  Aurora Lees PT, DPT  Physical Therapist - [place]  [facility]  10:26 AM 06/09/23

## 2023-06-09 NOTE — Therapy (Signed)
 OUTPATIENT SPEECH LANGUAGE PATHOLOGY PARKINSON'S TREATMENT   Patient Name: Katelyn Crawford MRN: [id number] DOB:1940/10/22, 83 y.o., female Today's Date: 06/09/2023  PCP: Shary Deems, MD REFERRING PROVIDER: Devora Folks, MD   End of Session - 06/09/23 1211     Visit Number 4             Past Medical History:  Diagnosis Date   Anemia    Aortic atherosclerosis (HCC)    Arthritis    Cardiomegaly    Cardiomyopathy (HCC)    Cholelithiasis    Chronic kidney disease, stage 2 (mild)    Chronic sinus bradycardia    Coronary artery disease 03/10/2012   a.) LHC 03/10/2012: 40% mLAD, 50% pRI, 50% pRCA, 90% mRCA, 50% dRCA, 50% RPDA - unable to cannulate RCA - med mgmt; b.) LHC/PCI 06/25/2013: 30% pLAD, 40% mLAD, 20% mLCx, 30% RI, 30% pRCA, 70% mRCA (2.5 mm Promus DES), 40% dRCA-1, 90% dRCA-2 (2.5 mm Promus DES)   DDD (degenerative disc disease), thoracolumbar    Diverticulosis    Dizziness    Environmental allergies    Full dentures    GERD (gastroesophageal reflux disease) 06/25/2013   H/O bilateral cataract extraction 2019   History of diverticulosis    History of kidney stones    HLD (hyperlipidemia)    Hyperlipidemia, unspecified    Hypertension    Nephrolithiasis    Osteoporosis, post-menopausal    Parkinson disease (HCC)    Pre-diabetes    PVC's (premature ventricular contractions)    Scoliosis    Thoracic kyphosis    Trifascicular bundle branch block    a.) 1st degree AVB + RBBB + LAFB   Vascular disease    Past Surgical History:  Procedure Laterality Date   APPENDECTOMY     BREAST EXCISIONAL BIOPSY Left 1984   neg   BREAST EXCISIONAL BIOPSY Right 1997   lipoma   CATARACT EXTRACTION W/PHACO Left 09/02/2017   Procedure: CATARACT EXTRACTION PHACO AND INTRAOCULAR LENS PLACEMENT (IOC)  LEFT;  Surgeon: Annell Kidney, MD;  Location: [facility];  Service: Ophthalmology;  Laterality: Left;   CATARACT EXTRACTION W/PHACO Right 10/01/2017   Procedure:  CATARACT EXTRACTION PHACO AND INTRAOCULAR LENS PLACEMENT (IOC) RIGHT;  Surgeon: Annell Kidney, MD;  Location: [facility];  Service: Ophthalmology;  Laterality: Right;   COLONOSCOPY WITH PROPOFOL  N/A 11/27/2016   Procedure: COLONOSCOPY WITH PROPOFOL ;  Surgeon: Toledo, Alphonsus Jeans, MD;  Location: [facility];  Service: Gastroenterology;  Laterality: N/A;   CORONARY ANGIOPLASTY WITH STENT PLACEMENT Left 06/25/2013   Procedure: CORONARY ANGIOPLASTY WITH STENT PLACEMENT; Location: [facility]; Surgeon: Arletta Lager, MD   KNEE ARTHROPLASTY Left 01/22/2017   Procedure: COMPUTER ASSISTED TOTAL KNEE ARTHROPLASTY;  Surgeon: Arlyne Lame, MD;  Location: [facility];  Service: Orthopedics;  Laterality: Left;   KNEE ARTHROSCOPY Left 02/01/2015   Procedure: ARTHROSCOPY KNEE, partial medial & lateral menisectomy,,medial and patelofemoral chondroplasty;  Surgeon: Arlyne Lame, MD;  Location: [facility];  Service: Orthopedics;  Laterality: Left;   LEFT HEART CATH AND CORONARY ANGIOGRAPHY Left 03/10/2012   Procedure: LEFT HEART CATH AND CORONARY ANGIOGRAPHY; Location: [facility]; Surgeon: Percival Brace, MD   LITHOTRIPSY Right 1995   RIGHT AXILLARY LIPOMA REMOVAL     TUBAL LIGATION     VARICOSE VEIN SURGERY     Patient Active Problem List   Diagnosis Date Noted   Primary osteoarthritis of hip 02/09/2017   Primary osteoarthritis of knee 02/09/2017   Chronic sinus bradycardia 01/22/2017   Degenerative disc disease, lumbar  01/22/2017   Dizziness 01/22/2017   Nephrolithiasis 01/22/2017   Osteoporosis, 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/22/2017   PVC's (premature ventricular contractions) 01/22/2017   S/P total knee arthroplasty 01/22/2017   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   CKD (chronic kidney disease), 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   GERD (gastroesophageal reflux disease) 06/25/2013   Coronary artery disease involving native coronary artery of native heart 06/24/2013   Essential hypertension 06/21/2013   Mixed  hyperlipidemia 06/21/2013    ONSET DATE: 05/08/23 referral date, dx with PD in 2019  REFERRING DIAG: Parkinson's Disease  THERAPY DIAG:  Dysarthria and anarthria  Rationale for Evaluation and Treatment Rehabilitation  SUBJECTIVE:   SUBJECTIVE STATEMENT: Pt alert, pleasant, and cooperative.  Pt accompanied by: self  PERTINENT HISTORY: Pt is an 83 y.o. 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 Pt with PMHx GERD, HTN, CAD, and osteoathritis. Per chart review, pt previously participated in LSVT in 2023. Noted, MBS on 04/12/20 with findings of mild oral dysphagia. Regular diet and thin liquids was recommended at that time.   DIAGNOSTIC FINDINGS: MRI brain, 10/26/22, "No acute finding by MRI. Moderate chronic small-vessel ischemic changes of the cerebral hemispheric white matter, which is considerably progressive when compared to the study of 2019."  PAIN:  Are you having pain? No  FALLS: Has patient fallen in last 6 months?  See PT evaluation for details  LIVING ENVIRONMENT: Lives with: lives alone; daughter lives next door   PLOF:  Level of assistance: Independent with ADLs Employment: Retired  PATIENT GOALS    for family to hear me  OBJECTIVE:  TODAY'S TREATMENT:  Pt seen for intensity based Parkinson's treatment. Reviewed HEP. Voice ex's completed as follows: Sustained /a/: x10, 84dB Ascending pitch glides: x10, 85dB Descending pitch glides: x10, 84db   Pt read x10 functional phrases twice through with loud and intentional voice, averaging 83 dB.    During 15 minute speech sample, pt averaged 77dB.   Pt benefited from use of biofeeback using Voice Analyst app.    Pt educated re: changes to speech/voice due to Parkinson's Disease, voice ex's, SLP POC, and HEP.   PATIENT EDUCATION: Education details: as above Person educated: Patient Education method: Explanation Education  comprehension: verbalized understanding and needs further education   HOME EXERCISE PROGRAM: As above (vocal ex's, reading)   GOALS: Goals reviewed with patient? Yes  SHORT TERM GOALS: Target date: 10 sessions  Pt will participate in further cognitive-linguistic assessment with additional goals, as appropriate. Baseline: Goal status: INITIAL  2.  The patient will complete Daily Tasks (Maximum duration "ah", High/Lows, and Functional Phrases) at average loudness >/= 80 dB and with loud, good quality voice with min cues.  Baseline:  Goal status: INITIAL  3.  The patient will complete Hierarchal Speech Loudness reading drills (words/phrases, sentences) at average >/= 75 dB and with loud, good quality voice with min cues.  Baseline:  Goal status: INITIAL  4.  The patient will participate in 5-8 minutes conversation, maintaining average loudness of 75 dB and good quality voice with modified independence.  Baseline:  Goal status: INITIAL    LONG TERM GOALS: Target date: 12 weeks  The patient will complete Daily Tasks (Maximum duration "ah", High/Lows, and Functional Phrases) at average loudness >/= 80 dB and with loud, good quality voice.  Baseline:  Goal status: INITIAL  2.  The patient will complete Hierarchal Speech Loudness reading drills (words/phrases, sentences, and paragraph) at average >/= 75 dB and  with loud, good quality voice.  Baseline:  Goal status: INITIAL  3.  The patient will participate in 20 minutes conversation, maintaining average loudness of >/= 75 dB and loud, good quality voice.  Baseline:  Goal status: INITIAL  4.  The patient will report improved communication effectiveness as measured by the Communication Effectiveness Survey.  Baseline: 05/12/23: 12/32 Goal status: INITIAL    ASSESSMENT:  CLINICAL IMPRESSION: Pt is an 83 y.o. 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 Pt with PMHx GERD,  HTN, CAD, and osteoathritis. Per chart review, pt previously participated in LSVT in 2023. Recent assessment completed via informal means and PROM. 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 Patient reports requests to repeat herself from friends and family, and has noticed a decline in her vocal quality over several years. Patient did exhibit some strain during phonation tasks with stimulability testing, but was stimulable for improved loudness, pitch variation and vocal quality in reading and conversation using model and cues for "Loud, like your talking to a crowd." See details of tx session above. Recommend course of intensity based ST to maximize pt's vocal quality and intelligibility for conversations with family and friends to reduce social isolation and improve quality of life.  OBJECTIVE IMPAIRMENTS include dysarthria. These impairments are limiting patient from effectively communicating at home and in community. Factors affecting potential to achieve goals and functional outcome are co-morbidities. Patient will benefit from skilled SLP services to address above impairments and improve overall function.  REHAB POTENTIAL: Good  PLAN: SLP FREQUENCY: 2x/week  SLP DURATION: 12 weeks  PLANNED INTERVENTIONS: Cueing hierachy, Internal/external aids, Functional tasks, SLP instruction and feedback, Compensatory strategies, and Patient/family education   Dia Forget, M.S., CCC-SLP Speech-Language Pathologist [place] [facility] [phone] [place])   [facility] [address] [place], [place], [address] Phone: [phone]   Fax:  [phone]

## 2023-06-11 ENCOUNTER — Ambulatory Visit: Payer: Medicare Other | Admitting: Physical Therapy

## 2023-06-11 ENCOUNTER — Ambulatory Visit

## 2023-06-11 DIAGNOSIS — R471 Dysarthria and anarthria: Secondary | ICD-10-CM

## 2023-06-11 DIAGNOSIS — R262 Difficulty in walking, not elsewhere classified: Secondary | ICD-10-CM

## 2023-06-11 DIAGNOSIS — M6281 Muscle weakness (generalized): Secondary | ICD-10-CM

## 2023-06-11 DIAGNOSIS — R2689 Other abnormalities of gait and mobility: Secondary | ICD-10-CM

## 2023-06-11 DIAGNOSIS — G20A1 Parkinson's disease without dyskinesia, without mention of fluctuations: Secondary | ICD-10-CM

## 2023-06-11 DIAGNOSIS — R278 Other lack of coordination: Secondary | ICD-10-CM

## 2023-06-11 DIAGNOSIS — G8929 Other chronic pain: Secondary | ICD-10-CM

## 2023-06-11 DIAGNOSIS — R2681 Unsteadiness on feet: Secondary | ICD-10-CM

## 2023-06-11 NOTE — Therapy (Signed)
 OUTPATIENT PHYSICAL THERAPY THORACOLUMBAR TREATMENT/  PHYSICAL THERAPY PROGRESS NOTE   Dates of reporting period  05/05/23   to   06/11/2023    Patient Name: Katelyn Crawford MRN: [id number] DOB:[birth year], 83 y.o., female Today's Date: 06/11/2023   END OF SESSION:   PT End of Session - 06/11/23 1011     Visit Number 31    Number of Visits 38    Date for PT Re-Evaluation 07/07/23    PT Start Time 1015    PT Stop Time 1055    PT Time Calculation (min) 40 min    Equipment Utilized During Treatment Gait belt    Activity Tolerance Patient tolerated treatment well    Behavior During Therapy WFL for tasks assessed/performed                 Past Medical History:  Diagnosis Date   Anemia    Aortic atherosclerosis (HCC)    Arthritis    Cardiomegaly    Cardiomyopathy (HCC)    Cholelithiasis    Chronic kidney disease, stage 2 (mild)    Chronic sinus bradycardia    Coronary artery disease 03/10/2012   a.) LHC 03/10/2012: 40% mLAD, 50% pRI, 50% pRCA, 90% mRCA, 50% dRCA, 50% RPDA - unable to cannulate RCA - med mgmt; b.) LHC/PCI 06/25/2013: 30% pLAD, 40% mLAD, 20% mLCx, 30% RI, 30% pRCA, 70% mRCA (2.5 mm Promus DES), 40% dRCA-1, 90% dRCA-2 (2.5 mm Promus DES)   DDD (degenerative disc disease), thoracolumbar    Diverticulosis    Dizziness    Environmental allergies    Full dentures    GERD (gastroesophageal reflux disease) 06/25/2013   H/O bilateral cataract extraction 2019   History of diverticulosis    History of kidney stones    HLD (hyperlipidemia)    Hyperlipidemia, unspecified    Hypertension    Nephrolithiasis    Osteoporosis, post-menopausal    Parkinson disease (HCC)    Pre-diabetes    PVC's (premature ventricular contractions)    Scoliosis    Thoracic kyphosis    Trifascicular bundle branch block    a.) 1st degree AVB + RBBB + LAFB   Vascular disease    Past Surgical History:  Procedure Laterality Date   APPENDECTOMY     BREAST EXCISIONAL BIOPSY Left 1984    neg   BREAST EXCISIONAL BIOPSY Right 1997   lipoma   CATARACT EXTRACTION W/PHACO Left 09/02/2017   Procedure: CATARACT EXTRACTION PHACO AND INTRAOCULAR LENS PLACEMENT (IOC)  LEFT;  Surgeon: Annell Kidney, MD;  Location: [facility];  Service: Ophthalmology;  Laterality: Left;   CATARACT EXTRACTION W/PHACO Right 10/01/2017   Procedure: CATARACT EXTRACTION PHACO AND INTRAOCULAR LENS PLACEMENT (IOC) RIGHT;  Surgeon: Annell Kidney, MD;  Location: [facility];  Service: Ophthalmology;  Laterality: Right;   COLONOSCOPY WITH PROPOFOL  N/A 11/27/2016   Procedure: COLONOSCOPY WITH PROPOFOL ;  Surgeon: Toledo, Alphonsus Jeans, MD;  Location: [facility];  Service: Gastroenterology;  Laterality: N/A;   CORONARY ANGIOPLASTY WITH STENT PLACEMENT Left 06/25/2013   Procedure: CORONARY ANGIOPLASTY WITH STENT PLACEMENT; Location: [facility]; Surgeon: Arletta Lager, MD   KNEE ARTHROPLASTY Left 01/22/2017   Procedure: COMPUTER ASSISTED TOTAL KNEE ARTHROPLASTY;  Surgeon: Arlyne Lame, MD;  Location: [facility];  Service: Orthopedics;  Laterality: Left;   KNEE ARTHROSCOPY Left 02/01/2015   Procedure: ARTHROSCOPY KNEE, partial medial & lateral menisectomy,,medial and patelofemoral chondroplasty;  Surgeon: Arlyne Lame, MD;  Location: [facility];  Service: Orthopedics;  Laterality: Left;   LEFT  HEART CATH AND CORONARY ANGIOGRAPHY Left 03/10/2012   Procedure: LEFT HEART CATH AND CORONARY ANGIOGRAPHY; Location: [facility]; Surgeon: Percival Brace, MD   LITHOTRIPSY Right 1995   RIGHT AXILLARY LIPOMA REMOVAL     TUBAL LIGATION     VARICOSE VEIN SURGERY     Patient Active Problem List   Diagnosis Date Noted   Primary osteoarthritis of hip 02/09/2017   Primary osteoarthritis of knee 02/09/2017   Chronic sinus bradycardia 01/22/2017   Degenerative disc disease, lumbar 01/22/2017   Dizziness 01/22/2017   Nephrolithiasis 01/22/2017   Osteoporosis, 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/22/2017   PVC's (premature  ventricular contractions) 01/22/2017   S/P total knee arthroplasty 01/22/2017   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   CKD (chronic kidney disease), 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   GERD (gastroesophageal reflux disease) 06/25/2013   Coronary artery disease involving native coronary artery of native heart 06/24/2013   Essential hypertension 06/21/2013   Mixed hyperlipidemia 06/21/2013    PCP: Yehuda Helms, MD   REFERRING PROVIDER: Meeler, Driscilla George, FNP  REFERRING DIAG:  Diagnosis  [phone] (ICD-10-CM) - Lumbar stenosis with neurogenic claudication  M54.14 (ICD-10-CM) - Thoracic radiculitis    Rationale for Evaluation and Treatment: Rehabilitation  THERAPY DIAG:  Muscle weakness (generalized)  Parkinson's disease, unspecified whether dyskinesia present, unspecified whether manifestations fluctuate (HCC)  Unsteadiness on feet  Other abnormalities of gait and mobility  Difficulty in walking, not elsewhere classified  Chronic bilateral thoracic back pain  Other lack of coordination  ONSET DATE: >3 months   SUBJECTIVE:                                                                                                                                                                                           SUBJECTIVE STATEMENT:   Arrives to PT motivated to participate coming SLP treatment. Pt reports that current pain level 3/10 in lower back from house work yesterday.    PERTINENT HISTORY:  Hx of PD and familiar to this clinic for PD management.  From recent MD appointment:  "The patient is a pleasant 83 year old female who presents today for acute on chronic bilateral low back pain without radiation to the lower extremities. She describes pain that began June 2020 without injury. She also states that she has had some form of back pain for many years. She describes her pain as mild to moderate that is sharp and intermittent. Bending and lifting increases her pain. Rest can  help alleviate her pain. She participated in physical therapy at [facility] in early 2020 with little relief. She continues to stay active in her home and notices that household activities increase her  pain such as vacuuming and mopping. Medications include Tylenol  (mild to moderate relief).  She was last evaluated on 10/09/2022 at which time she was doing well. She was to continue with heat and home cycling along with physical therapy exercises. She was to continue with tramadol  nightly as needed.  At today's visit she reports that she is experience of increasing pain in the morning in her mid back about the bra line. She would like to return to physical therapy and she states that this was very helpful in managing her pain. We discussed if her pain was to continue consideration would be made for another epidural. She states that she is taking tramadol  at night and doing well with this. Denies any injury. Upon palpation today she is without tenderness to the thoracic paraspinal musculature. "  PAIN:  Are you having pain? Yes: NPRS scale: 0/10 Pain location: middle back Pain description: ache Aggravating factors: sleeping Relieving factors: tramadol    PRECAUTIONS: Fall   RED FLAGS: None   WEIGHT BEARING RESTRICTIONS: No  FALLS:  Has patient fallen in last 6 months? No  LIVING ENVIRONMENT: Lives with: lives alone and daughter lives next door. Lives in: House/apartment Stairs: Yes: External: 4 steps; on right going up and on left going up Has following equipment at home:  None, but has use cane in the past. Has shower rails, shower chair; doesn't use  OCCUPATION: retired   PLOF: Independent, Independent with basic ADLs, and Independent with gait  PATIENT GOALS: stronger. Improve mobility   NEXT MD VISIT: unsure   OBJECTIVE:  Note: Objective measures were completed at Evaluation unless otherwise noted.  DIAGNOSTIC FINDINGS:  No recent imaging   PATIENT SURVEYS:  Modified  Oswestry 28%   COGNITION: Overall cognitive status: Within functional limits for tasks assessed     SENSATION: WFL  MUSCLE LENGTH: WFL  POSTURE: rounded shoulders, forward head, and decreased lumbar lordosis  PALPATION: Noted tightness in upper thoracic paraspinal. No pain to palpation   LUMBAR ROM:   AROM eval  Flexion WFL  Extension 20  Right lateral flexion   Left lateral flexion   Right rotation minimal  Left rotation ~10 deg   Cervical  AROM eval  Flexion 45  Extension 18  Right lateral flexion 20  Left lateral flexion 12  Right rotation 50  Left rotation  40  Thoracic  AROM eval  Flexion WFL  Extension 10  Right lateral flexion 25  Left lateral flexion 20  Right rotation minimal  Left rotation ~10 deg      (Blank rows = not tested)  LOWER EXTREMITY ROM:     Grossly WFL  LOWER EXTREMITY MMT:    MMT Right eval Left eval  Hip flexion 4- 4-  Hip extension    Hip abduction 4- 4-  Hip adduction 4 4  Hip internal rotation    Hip external rotation    Knee flexion 4 4  Knee extension 4+ 4+  Ankle dorsiflexion 4 4  Ankle plantarflexion    Ankle inversion    Ankle eversion     (Blank rows = not tested)    FUNCTIONAL TESTS:  5 times sit to stand: 16  Timed up and go (TUG): 12.3 sce 6 minute walk test: 1156ft 10 meter walk test: 0.49m/s Functional gait assessment: 21  4/21: miniBest: 19/28   GAIT: Distance walked: 70 Assistive device utilized: None Level of assistance: Complete Independence Comments: flexed posture.   TREATMENT DATE: 06/11/2023 TA and TE for  improved safety and independence with daily tasks and improve body mechanics to reduce back and shoulder pain.   Sit<>stand with UE swing into extension x 10  Seated Trunk rotation to "high five" contralateral UE  Seated trunk lateral flexion with shoulders at 90 deg x 10 bil  Seated rotational stepping in chair x 10 bil  Standing lateral stepping with UE supported on chair  back Sit<>stand with rise to toes and UE support on chair x 10 Forward step with BUE forward reach x 10 bil cues for finger extension and full shoulder ROM  Hip abduction with 2 sec hold x 12 bil  Hip extension 2 sec hold x 12 bil  Walking around chair side stepping, forward/reverse x 3 bil with cues for proper change in direction.  PT provided supervision assist throughout session with CGA with forward step with BUE reach.   PATIENT EDUCATION:  Education details:exercise technique, large-amplitude training/purpose, techniques for donning jacket, finger flicks Person educated: Patient Education method: IT trainer, VC Education comprehension: verbalized understanding, returned demo  HOME EXERCISE PROGRAM: Access Code: [email or web address] URL: [email or web address] Date: 02/20/2023 Prepared by: Aurora Lees  Exercises - Sit to Stand with Arm Swing  - 1 x daily - 7 x weekly - 2 sets - 10 reps - 2 hold - Seated Reach Forward, Up, and To Sides  - 1 x daily - 7 x weekly - 3 sets - 10 reps - Seated Reaching to Side and Across Body  - 1 x daily - 7 x weekly - 3 sets - 10 reps - Standing Row with Anchored Resistance  - 1 x daily - 7 x weekly - 3 sets - 10 reps  Access Code: [facility] URL: [email or web address] Date: 05/28/2023 Prepared by: Aurora Lees  Exercises - Step Sideways with Arms Reaching  - 1 x daily - 7 x weekly - 3 sets - 10 reps - 2 hold - Staggered Stance Weight Shift with Arms Reaching  - 1 x daily - 7 x weekly - 3 sets - 10 reps - 2 hold - Standing Reach to Opposite Side with Weight Shift  - 1 x daily - 7 x weekly - 3 sets - 10 reps - 2 hold  ASSESSMENT:  CLINICAL IMPRESSION:  Pt arrives highly motivated to participate in therapy session. Pt reports that her upper back is feeling better today, but mild low back pain. Continued to instruct pt in postural education and interventions with emphasis on large amplitude movements. With fatigue, pt was  noted to have mild lateral lean to the R, but improved sustained horizontal abduction in Bil shoulders on this day. Pt  will benefit from continued skilled PT to address back pain improve strength, increase endurance, and improve overall fucntional mobility    OBJECTIVE IMPAIRMENTS: Abnormal gait, cardiopulmonary status limiting activity, decreased activity tolerance, decreased balance, decreased endurance, decreased mobility, difficulty walking, decreased ROM, decreased strength, hypomobility, increased fascial restrictions, impaired perceived functional ability, impaired flexibility, impaired UE functional use, improper body mechanics, and postural dysfunction.   ACTIVITY LIMITATIONS: carrying, squatting, sleeping, and locomotion level  PARTICIPATION LIMITATIONS: cleaning, laundry, shopping, and community activity  PERSONAL FACTORS: Age, Fitness, and 1-2 comorbidities: lumbar stenosis  are also affecting patient's functional outcome.   REHAB POTENTIAL: Good  CLINICAL DECISION MAKING: Stable/uncomplicated  EVALUATION COMPLEXITY: Low   GOALS: Goals reviewed with patient? Yes   SHORT TERM GOALS: Target date: 06/09/2023    Patient will be independent in home exercise program to improve strength/mobility for better functional independence  with ADLs. Baseline: to be given at session 2.  05/07/2023: need to be re-assessed Goal status: INITIAL   LONG TERM GOALS: Target date: 07/07/2023      Patient will improve Mod ODI score by 12.8 points  to demonstrate statistically significant improvement in mobility and quality of life.  Baseline: 28% 3/12: 26% 05/07/2023: 32% indicating moderate disability 5/19/: 24% Goal status: IN PROGRESS  2.  Patient (> 13 years old) will complete five times sit to stand test in < 15 seconds indicating an increased LE strength and improved balance. Baseline: 16 sec  3/12: 11.81. sec  05/07/2023: 11.09 seconds 5/19: 10.41 sec no UE support  Goal  status: MET  Upgraded Goal on 05/07/2023: Patient will increase MiniBest Test score to >18/28 to indicate a reduced risk for falling and demonstrate increased independence with functional mobility and ADLs.  Baseline: need to assess  Goal status: INITIAL  3.  Patient will increase 6 min walk test score by >150 to demonstrated improved access to community and improve safety.   Baseline: 1154ft 3/12: 1222ft 05/07/2023: 1231ft 5/19: 1148ft  Goal status: IN PROGRESS  4.  Patient will increase 10 meter walk test to >1.70m/s as to improve gait speed for better community ambulation and to reduce fall risk. Baseline: 0.57m/s  3/12: 1.42m/s 05/07/2023: 1.098 m/s no AD, independently  Goal status: MET  5.  Patient will reduce timed up and go to <11 seconds to reduce fall risk and demonstrate improved transfer/gait ability. Baseline: 13sec  3/12; 12.5sec  05/07/2023: 11.01 seconds no AD, independently Goal status: MET  Upgraded Goal on 05/07/2023: Patient will increase Functional Gait Assessment (FGA) score to >20/30 as to reduce fall risk and improve dynamic gait safety with community ambulation. Baseline: 5/19: 22 Goal Status: INITIAL   6.  Patient will reduced back pain to 3/10 at worst to indicate improved function with household tasks.  Baseline: 9/10 at night.  3/12: 8/10, when waking up in the morning 05/07/2023: continues to report back pain gets as high as 8/10 when bending forward 5/19:  Goal status: IN PROGRESS  PLAN:  PT FREQUENCY: 1-2x/week  PT DURATION: 12 weeks  PLANNED INTERVENTIONS: 97110-Therapeutic exercises, 97530- Therapeutic activity, 97112- Neuromuscular re-education, 97535- Self Care, 16109- Manual therapy, 97116- Gait training, 97014- Electrical stimulation (unattended), [phone]- Electrical stimulation (manual), Balance training, Stair training, Taping, Dry Needling, Joint mobilization, Joint manipulation, Spinal manipulation, Spinal mobilization, Scar mobilization, DME  instructions, Cryotherapy, and Moist heat.  PLAN FOR NEXT SESSION:   Continued to manage Back pain and PD deficits. Complete MiniBest.    Aurora Lees PT, DPT  Physical Therapist - [facility]  10:12 AM 06/11/23

## 2023-06-11 NOTE — Therapy (Signed)
 OUTPATIENT SPEECH LANGUAGE PATHOLOGY PARKINSON'S TREATMENT   Patient Name: Katelyn Crawford MRN: [id number] DOB:August 07, 1940, 83 y.o., female, female Today's Date: 06/11/2023  PCP: Shary Deems, MD REFERRING PROVIDER: Devora Folks, MD   End of Session - 06/11/23 1152     Visit Number 6             Past Medical History:  Diagnosis Date   Anemia    Aortic atherosclerosis (HCC)    Arthritis    Cardiomegaly    Cardiomyopathy (HCC)    Cholelithiasis    Chronic kidney disease, stage 2 (mild)    Chronic sinus bradycardia    Coronary artery disease 03/10/2012   a.) LHC 03/10/2012: 40% mLAD, 50% pRI, 50% pRCA, 90% mRCA, 50% dRCA, 50% RPDA - unable to cannulate RCA - med mgmt; b.) LHC/PCI 06/25/2013: 30% pLAD, 40% mLAD, 20% mLCx, 30% RI, 30% pRCA, 70% mRCA (2.5 mm Promus DES), 40% dRCA-1, 90% dRCA-2 (2.5 mm Promus DES)   DDD (degenerative disc disease), thoracolumbar    Diverticulosis    Dizziness    Environmental allergies    Full dentures    GERD (gastroesophageal reflux disease) 06/25/2013   H/O bilateral cataract extraction 2019   History of diverticulosis    History of kidney stones    HLD (hyperlipidemia)    Hyperlipidemia, unspecified    Hypertension    Nephrolithiasis    Osteoporosis, post-menopausal    Parkinson disease (HCC)    Pre-diabetes    PVC's (premature ventricular contractions)    Scoliosis    Thoracic kyphosis    Trifascicular bundle branch block    a.) 1st degree AVB + RBBB + LAFB   Vascular disease    Past Surgical History:  Procedure Laterality Date   APPENDECTOMY     BREAST EXCISIONAL BIOPSY Left 1984   neg   BREAST EXCISIONAL BIOPSY Right 1997   lipoma   CATARACT EXTRACTION W/PHACO Left 09/02/2017   Procedure: CATARACT EXTRACTION PHACO AND INTRAOCULAR LENS PLACEMENT (IOC)  LEFT;  Surgeon: Annell Kidney, MD;  Location: [facility];  Service: Ophthalmology;  Laterality: Left;   CATARACT EXTRACTION W/PHACO Right 10/01/2017   Procedure:  CATARACT EXTRACTION PHACO AND INTRAOCULAR LENS PLACEMENT (IOC) RIGHT;  Surgeon: Annell Kidney, MD;  Location: [facility];  Service: Ophthalmology;  Laterality: Right;   COLONOSCOPY WITH PROPOFOL  N/A 11/27/2016   Procedure: COLONOSCOPY WITH PROPOFOL ;  Surgeon: Toledo, Alphonsus Jeans, MD;  Location: [facility];  Service: Gastroenterology;  Laterality: N/A;   CORONARY ANGIOPLASTY WITH STENT PLACEMENT Left 06/25/2013   Procedure: CORONARY ANGIOPLASTY WITH STENT PLACEMENT; Location: [facility]; Surgeon: Arletta Lager, MD   KNEE ARTHROPLASTY Left 01/22/2017   Procedure: COMPUTER ASSISTED TOTAL KNEE ARTHROPLASTY;  Surgeon: Arlyne Lame, MD;  Location: [facility];  Service: Orthopedics;  Laterality: Left;   KNEE ARTHROSCOPY Left 02/01/2015   Procedure: ARTHROSCOPY KNEE, partial medial & lateral menisectomy,,medial and patelofemoral chondroplasty;  Surgeon: Arlyne Lame, MD;  Location: [facility];  Service: Orthopedics;  Laterality: Left;   LEFT HEART CATH AND CORONARY ANGIOGRAPHY Left 03/10/2012   Procedure: LEFT HEART CATH AND CORONARY ANGIOGRAPHY; Location: [facility]; Surgeon: Percival Brace, MD   LITHOTRIPSY Right 1995   RIGHT AXILLARY LIPOMA REMOVAL     TUBAL LIGATION     VARICOSE VEIN SURGERY     Patient Active Problem List   Diagnosis Date Noted   Primary osteoarthritis of hip 02/09/2017   Primary osteoarthritis of knee 02/09/2017   Chronic sinus bradycardia 01/22/2017   Degenerative disc disease, lumbar  01/22/2017   Dizziness 01/22/2017   Nephrolithiasis 01/22/2017   Osteoporosis, 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/22/2017   PVC's (premature ventricular contractions) 01/22/2017   S/P total knee arthroplasty 01/22/2017   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   CKD (chronic kidney disease), 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   GERD (gastroesophageal reflux disease) 06/25/2013   Coronary artery disease involving native coronary artery of native heart 06/24/2013   Essential hypertension 06/21/2013   Mixed  hyperlipidemia 06/21/2013    ONSET DATE: 05/08/23 referral date, dx with PD in 2019  REFERRING DIAG: Parkinson's Disease  THERAPY DIAG:  Dysarthria and anarthria  Rationale for Evaluation and Treatment Rehabilitation  SUBJECTIVE:   SUBJECTIVE STATEMENT: Pt alert, pleasant, and cooperative.  Pt accompanied by: self  PERTINENT HISTORY: Pt is an 83 y.o. 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 Pt with PMHx GERD, HTN, CAD, and osteoathritis. Per chart review, pt previously participated in LSVT in 2023. Noted, MBS on 04/12/20 with findings of mild oral dysphagia. Regular diet and thin liquids was recommended at that time.   DIAGNOSTIC FINDINGS: MRI brain, 10/26/22, "No acute finding by MRI. Moderate chronic small-vessel ischemic changes of the cerebral hemispheric white matter, which is considerably progressive when compared to the study of 2019."  PAIN:  Are you having pain? No  FALLS: Has patient fallen in last 6 months?  See PT evaluation for details  LIVING ENVIRONMENT: Lives with: lives alone; daughter lives next door   PLOF:  Level of assistance: Independent with ADLs Employment: Retired  PATIENT GOALS    for family to hear me  OBJECTIVE:  TODAY'S TREATMENT:  Pt seen for intensity based Parkinson's treatment. Reviewed HEP. Voice ex's completed as follows: Sustained /a/: x10, 84dB Ascending pitch glides: x10, 85dB Descending pitch glides: x10, 84db   Pt read x10 functional phrases twice through with loud and intentional voice, averaging 83 dB.    During 15 minute speech sample, pt averaged 77dB.   Pt benefited from use of biofeeback using Voice Analyst app.    Pt educated re: changes to speech/voice due to Parkinson's Disease, voice ex's, SLP POC, and HEP.   PATIENT EDUCATION: Education details: as above Person educated: Patient Education method: Explanation Education  comprehension: verbalized understanding and needs further education   HOME EXERCISE PROGRAM: As above (vocal ex's, reading)   GOALS: Goals reviewed with patient? Yes  SHORT TERM GOALS: Target date: 10 sessions  Pt will participate in further cognitive-linguistic assessment with additional goals, as appropriate. Baseline: Goal status: INITIAL  2.  The patient will complete Daily Tasks (Maximum duration "ah", High/Lows, and Functional Phrases) at average loudness >/= 80 dB and with loud, good quality voice with min cues.  Baseline:  Goal status: INITIAL  3.  The patient will complete Hierarchal Speech Loudness reading drills (words/phrases, sentences) at average >/= 75 dB and with loud, good quality voice with min cues.  Baseline:  Goal status: INITIAL  4.  The patient will participate in 5-8 minutes conversation, maintaining average loudness of 75 dB and good quality voice with modified independence.  Baseline:  Goal status: INITIAL    LONG TERM GOALS: Target date: 12 weeks  The patient will complete Daily Tasks (Maximum duration "ah", High/Lows, and Functional Phrases) at average loudness >/= 80 dB and with loud, good quality voice.  Baseline:  Goal status: INITIAL  2.  The patient will complete Hierarchal Speech Loudness reading drills (words/phrases, sentences, and paragraph) at average >/= 75 dB and  with loud, good quality voice.  Baseline:  Goal status: INITIAL  3.  The patient will participate in 20 minutes conversation, maintaining average loudness of >/= 75 dB and loud, good quality voice.  Baseline:  Goal status: INITIAL  4.  The patient will report improved communication effectiveness as measured by the Communication Effectiveness Survey.  Baseline: 05/12/23: 12/32 Goal status: INITIAL    ASSESSMENT:  CLINICAL IMPRESSION: Pt is an 83 y.o. 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 Pt with PMHx GERD,  HTN, CAD, and osteoathritis. Per chart review, pt previously participated in LSVT in 2023. Recent assessment completed via informal means and PROM. 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 Patient reports requests to repeat herself from friends and family, and has noticed a decline in her vocal quality over several years. Patient did exhibit some strain during phonation tasks with stimulability testing, but was stimulable for improved loudness, pitch variation and vocal quality in reading and conversation using model and cues for "Loud, like your talking to a crowd." See details of tx session above. Recommend course of intensity based ST to maximize pt's vocal quality and intelligibility for conversations with family and friends to reduce social isolation and improve quality of life.  OBJECTIVE IMPAIRMENTS include dysarthria. These impairments are limiting patient from effectively communicating at home and in community. Factors affecting potential to achieve goals and functional outcome are co-morbidities. Patient will benefit from skilled SLP services to address above impairments and improve overall function.  REHAB POTENTIAL: Good  PLAN: SLP FREQUENCY: 2x/week  SLP DURATION: 12 weeks  PLANNED INTERVENTIONS: Cueing hierachy, Internal/external aids, Functional tasks, SLP instruction and feedback, Compensatory strategies, and Patient/family education   Dia Forget, M.S., CCC-SLP Speech-Language Pathologist [facility] [phone] [place])   [place] [facility] at [facility] [address] [place], [place], [address] Phone: [phone]   Fax:  [phone]

## 2023-06-18 ENCOUNTER — Ambulatory Visit

## 2023-06-18 ENCOUNTER — Ambulatory Visit: Payer: Medicare Other | Admitting: Physical Therapy

## 2023-06-18 DIAGNOSIS — R2681 Unsteadiness on feet: Secondary | ICD-10-CM

## 2023-06-18 DIAGNOSIS — G8929 Other chronic pain: Secondary | ICD-10-CM

## 2023-06-18 DIAGNOSIS — R262 Difficulty in walking, not elsewhere classified: Secondary | ICD-10-CM

## 2023-06-18 DIAGNOSIS — M6281 Muscle weakness (generalized): Secondary | ICD-10-CM | POA: Diagnosis not present

## 2023-06-18 DIAGNOSIS — G20A1 Parkinson's disease without dyskinesia, without mention of fluctuations: Secondary | ICD-10-CM

## 2023-06-18 DIAGNOSIS — R278 Other lack of coordination: Secondary | ICD-10-CM

## 2023-06-18 DIAGNOSIS — R2689 Other abnormalities of gait and mobility: Secondary | ICD-10-CM

## 2023-06-18 DIAGNOSIS — R471 Dysarthria and anarthria: Secondary | ICD-10-CM

## 2023-06-18 NOTE — Therapy (Signed)
 OUTPATIENT PHYSICAL THERAPY THORACOLUMBAR TREATMENT/      Patient Name: Katelyn Crawford MRN: [id number] DOB:[birth year], 83 y.o., female Today's Date: 06/18/2023   END OF SESSION:   PT End of Session - 06/18/23 1018     Visit Number 32    Number of Visits 38    Date for PT Re-Evaluation 07/07/23    PT Start Time 1016    PT Stop Time 1100    PT Time Calculation (min) 44 min    Equipment Utilized During Treatment Gait belt    Activity Tolerance Patient tolerated treatment well    Behavior During Therapy WFL for tasks assessed/performed                 Past Medical History:  Diagnosis Date   Anemia    Aortic atherosclerosis (HCC)    Arthritis    Cardiomegaly    Cardiomyopathy (HCC)    Cholelithiasis    Chronic kidney disease, stage 2 (mild)    Chronic sinus bradycardia    Coronary artery disease 03/10/2012   a.) LHC 03/10/2012: 40% mLAD, 50% pRI, 50% pRCA, 90% mRCA, 50% dRCA, 50% RPDA - unable to cannulate RCA - med mgmt; b.) LHC/PCI 06/25/2013: 30% pLAD, 40% mLAD, 20% mLCx, 30% RI, 30% pRCA, 70% mRCA (2.5 mm Promus DES), 40% dRCA-1, 90% dRCA-2 (2.5 mm Promus DES)   DDD (degenerative disc disease), thoracolumbar    Diverticulosis    Dizziness    Environmental allergies    Full dentures    GERD (gastroesophageal reflux disease) 06/25/2013   H/O bilateral cataract extraction 2019   History of diverticulosis    History of kidney stones    HLD (hyperlipidemia)    Hyperlipidemia, unspecified    Hypertension    Nephrolithiasis    Osteoporosis, post-menopausal    Parkinson disease (HCC)    Pre-diabetes    PVC's (premature ventricular contractions)    Scoliosis    Thoracic kyphosis    Trifascicular bundle branch block    a.) 1st degree AVB + RBBB + LAFB   Vascular disease    Past Surgical History:  Procedure Laterality Date   APPENDECTOMY     BREAST EXCISIONAL BIOPSY Left 1984   neg   BREAST EXCISIONAL BIOPSY Right 1997   lipoma   CATARACT EXTRACTION W/PHACO  Left 09/02/2017   Procedure: CATARACT EXTRACTION PHACO AND INTRAOCULAR LENS PLACEMENT (IOC)  LEFT;  Surgeon: Annell Kidney, MD;  Location: [facility];  Service: Ophthalmology;  Laterality: Left;   CATARACT EXTRACTION W/PHACO Right 10/01/2017   Procedure: CATARACT EXTRACTION PHACO AND INTRAOCULAR LENS PLACEMENT (IOC) RIGHT;  Surgeon: Annell Kidney, MD;  Location: [facility];  Service: Ophthalmology;  Laterality: Right;   COLONOSCOPY WITH PROPOFOL  N/A 11/27/2016   Procedure: COLONOSCOPY WITH PROPOFOL ;  Surgeon: Toledo, Alphonsus Jeans, MD;  Location: [facility];  Service: Gastroenterology;  Laterality: N/A;   CORONARY ANGIOPLASTY WITH STENT PLACEMENT Left 06/25/2013   Procedure: CORONARY ANGIOPLASTY WITH STENT PLACEMENT; Location: [facility]; Surgeon: Arletta Lager, MD   KNEE ARTHROPLASTY Left 01/22/2017   Procedure: COMPUTER ASSISTED TOTAL KNEE ARTHROPLASTY;  Surgeon: Arlyne Lame, MD;  Location: [facility];  Service: Orthopedics;  Laterality: Left;   KNEE ARTHROSCOPY Left 02/01/2015   Procedure: ARTHROSCOPY KNEE, partial medial & lateral menisectomy,,medial and patelofemoral chondroplasty;  Surgeon: Arlyne Lame, MD;  Location: [facility];  Service: Orthopedics;  Laterality: Left;   LEFT HEART CATH AND CORONARY ANGIOGRAPHY Left 03/10/2012   Procedure: LEFT HEART CATH AND CORONARY ANGIOGRAPHY; Location:  [facility]; Surgeon: Percival Brace, MD   LITHOTRIPSY Right 1995   RIGHT AXILLARY LIPOMA REMOVAL     TUBAL LIGATION     VARICOSE VEIN SURGERY     Patient Active Problem List   Diagnosis Date Noted   Primary osteoarthritis of hip 02/09/2017   Primary osteoarthritis of knee 02/09/2017   Chronic sinus bradycardia 01/22/2017   Degenerative disc disease, lumbar 01/22/2017   Dizziness 01/22/2017   Nephrolithiasis 01/22/2017   Osteoporosis, 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/22/2017   PVC's (premature ventricular contractions) 01/22/2017   S/P total knee arthroplasty 01/22/2017    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   CKD (chronic kidney disease), 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   GERD (gastroesophageal reflux disease) 06/25/2013   Coronary artery disease involving native coronary artery of native heart 06/24/2013   Essential hypertension 06/21/2013   Mixed hyperlipidemia 06/21/2013    PCP: Yehuda Helms, MD   REFERRING PROVIDER: Meeler, Driscilla George, FNP  REFERRING DIAG:  Diagnosis  [phone] (ICD-10-CM) - Lumbar stenosis with neurogenic claudication  M54.14 (ICD-10-CM) - Thoracic radiculitis    Rationale for Evaluation and Treatment: Rehabilitation  THERAPY DIAG:  Muscle weakness (generalized)  Parkinson's disease, unspecified whether dyskinesia present, unspecified whether manifestations fluctuate (HCC)  Unsteadiness on feet  Other abnormalities of gait and mobility  Difficulty in walking, not elsewhere classified  Chronic bilateral thoracic back pain  Other lack of coordination  ONSET DATE: >3 months   SUBJECTIVE:                                                                                                                                                                                           SUBJECTIVE STATEMENT:   Arrives to PT motivated to participate; coming SLP treatment. Pt reports that she has no pain at start of PT treatment. Has only had pain one day since last PT treatment.    PERTINENT HISTORY:  Hx of PD and familiar to this clinic for PD management.  From recent MD appointment:  "The patient is a pleasant 83 year old female who presents today for acute on chronic bilateral low back pain without radiation to the lower extremities. She describes pain that began June 2020 without injury. She also states that she has had some form of back pain for many years. She describes her pain as mild to moderate that is sharp and intermittent. Bending and lifting increases her pain. Rest can help alleviate her pain. She participated in physical  therapy at [facility] clinic in early 2020 with little relief. She continues to stay active in her home and notices that household activities increase her pain such as vacuuming and mopping. Medications include Tylenol  (  mild to moderate relief).  She was last evaluated on 10/09/2022 at which time she was doing well. She was to continue with heat and home cycling along with physical therapy exercises. She was to continue with tramadol  nightly as needed.  At today's visit she reports that she is experience of increasing pain in the morning in her mid back about the bra line. She would like to return to physical therapy and she states that this was very helpful in managing her pain. We discussed if her pain was to continue consideration would be made for another epidural. She states that she is taking tramadol  at night and doing well with this. Denies any injury. Upon palpation today she is without tenderness to the thoracic paraspinal musculature. "  PAIN:  Are you having pain? Yes: NPRS scale: 0/10 Pain location: middle back Pain description: ache Aggravating factors: sleeping Relieving factors: tramadol    PRECAUTIONS: Fall   RED FLAGS: None   WEIGHT BEARING RESTRICTIONS: No  FALLS:  Has patient fallen in last 6 months? No  LIVING ENVIRONMENT: Lives with: lives alone and daughter lives next door. Lives in: House/apartment Stairs: Yes: External: 4 steps; on right going up and on left going up Has following equipment at home:  None, but has use cane in the past. Has shower rails, shower chair; doesn't use  OCCUPATION: retired   PLOF: Independent, Independent with basic ADLs, and Independent with gait  PATIENT GOALS: stronger. Improve mobility   NEXT MD VISIT: unsure   OBJECTIVE:  Note: Objective measures were completed at Evaluation unless otherwise noted.  DIAGNOSTIC FINDINGS:  No recent imaging   PATIENT SURVEYS:  Modified Oswestry 28%   COGNITION: Overall cognitive status:  Within functional limits for tasks assessed     SENSATION: WFL  MUSCLE LENGTH: WFL  POSTURE: rounded shoulders, forward head, and decreased lumbar lordosis  PALPATION: Noted tightness in upper thoracic paraspinal. No pain to palpation   LUMBAR ROM:   AROM eval  Flexion WFL  Extension 20  Right lateral flexion   Left lateral flexion   Right rotation minimal  Left rotation ~10 deg   Cervical  AROM eval  Flexion 45  Extension 18  Right lateral flexion 20  Left lateral flexion 12  Right rotation 50  Left rotation  40  Thoracic  AROM eval  Flexion WFL  Extension 10  Right lateral flexion 25  Left lateral flexion 20  Right rotation minimal  Left rotation ~10 deg      (Blank rows = not tested)  LOWER EXTREMITY ROM:     Grossly WFL  LOWER EXTREMITY MMT:    MMT Right eval Left eval  Hip flexion 4- 4-  Hip extension    Hip abduction 4- 4-  Hip adduction 4 4  Hip internal rotation    Hip external rotation    Knee flexion 4 4  Knee extension 4+ 4+  Ankle dorsiflexion 4 4  Ankle plantarflexion    Ankle inversion    Ankle eversion     (Blank rows = not tested)    FUNCTIONAL TESTS:  5 times sit to stand: 16  Timed up and go (TUG): 12.3 sce 6 minute walk test: 1190ft 10 meter walk test: 0.40m/s Functional gait assessment: 21  4/21: miniBest: 19/28   GAIT: Distance walked: 70 Assistive device utilized: None Level of assistance: Complete Independence Comments: flexed posture.   TREATMENT DATE: 06/18/2023 Nustep BLE/BUE reciprocal movement training interval resistance level 1-5at 1 min bouts.  PT instructed pt in MiniBest.   [facility] PT Assessment - 06/18/23 0001       Mini-BESTest   Sit To Stand Normal: Comes to stand without use of hands and stabilizes independently.    Rise to Toes Moderate: Heels up, but not full range (smaller than when holding hands), OR noticeable instability for 3 s.    Stand on one leg (left) Moderate: < 20 s    Stand on  one leg (right) Moderate: < 20 s    Stand on one leg - lowest score 1    Compensatory Stepping Correction - Forward Moderate: More than one step is required to recover equilibrium    Compensatory Stepping Correction - Backward No step, OR would fall if not caught, OR falls spontaneously.    Compensatory Stepping Correction - Left Lateral Moderate: Several steps to recover equilibrium    Compensatory Stepping Correction - Right Lateral Moderate: Several steps to recover equilibrium    Stepping Corredtion Lateral - lowest score 1    Stance - Feet together, eyes open, firm surface  Normal: 30s    Stance - Feet together, eyes closed, foam surface  Normal: 30s    Incline - Eyes Closed Severe: Unable    Change in Gait Speed Normal: Significantly changes walkling speed without imbalance    Walk with head turns - Horizontal Moderate: performs head turns with reduction in gait speed.    Walk with pivot turns Moderate:Turns with feet close SLOW (>4 steps) with good balance.    Step over obstacles Moderate: Steps over box but touches box OR displays cautious behavior by slowing gait.    Timed UP & GO with Dual Task Severe: Stops counting while walking OR stops walking while counting.    Mini-BEST total score 15            SCORE 15/28   Standing with 1 foot on wedge 2 x 20 sec bil  Standing BLE on wedge 2 x 25 sec with min assist on first bout and supervision assist/min assist on seconds bout.  Standing on airex pad normal BOS 2 x 30 sec. Feet together x 30 sec    PT provided supervision assist throughout session with CGA with forward step with BUE reach.   PATIENT EDUCATION:  Education details:exercise technique, large-amplitude training/purpose, techniques for donning jacket, finger flicks Person educated: Patient Education method: IT trainer, VC Education comprehension: verbalized understanding, returned demo  HOME EXERCISE PROGRAM: Access Code: [email or web address] URL:  [email or web address] Date: 02/20/2023 Prepared by: Aurora Lees  Exercises - Sit to Stand with Arm Swing  - 1 x daily - 7 x weekly - 2 sets - 10 reps - 2 hold - Seated Reach Forward, Up, and To Sides  - 1 x daily - 7 x weekly - 3 sets - 10 reps - Seated Reaching to Side and Across Body  - 1 x daily - 7 x weekly - 3 sets - 10 reps - Standing Row with Anchored Resistance  - 1 x daily - 7 x weekly - 3 sets - 10 reps  Access Code: [facility] URL: [email or web address] Date: 05/28/2023 Prepared by: Aurora Lees  Exercises - Step Sideways with Arms Reaching  - 1 x daily - 7 x weekly - 3 sets - 10 reps - 2 hold - Staggered Stance Weight Shift with Arms Reaching  - 1 x daily - 7 x weekly - 3 sets - 10 reps - 2 hold - Standing Reach to Opposite Side with Weight Shift  -  1 x daily - 7 x weekly - 3 sets - 10 reps - 2 hold  ASSESSMENT:  CLINICAL IMPRESSION:  Pt arrives highly motivated to participate in therapy session. Pt reports that her upper back is feeling better today and has only had pain for 1 day since last PT treatment. PT treatment focused on assessment of functional movement through mini Best; 15/28. Pt then instructed in dynamic balance training on various surfaces. Difficulty in prevents of posterior LOB while on wedge. Pt  will benefit from continued skilled PT to address back pain improve strength, increase endurance, and improve overall fucntional mobility    OBJECTIVE IMPAIRMENTS: Abnormal gait, cardiopulmonary status limiting activity, decreased activity tolerance, decreased balance, decreased endurance, decreased mobility, difficulty walking, decreased ROM, decreased strength, hypomobility, increased fascial restrictions, impaired perceived functional ability, impaired flexibility, impaired UE functional use, improper body mechanics, and postural dysfunction.   ACTIVITY LIMITATIONS: carrying, squatting, sleeping, and locomotion level  PARTICIPATION  LIMITATIONS: cleaning, laundry, shopping, and community activity  PERSONAL FACTORS: Age, Fitness, and 1-2 comorbidities: lumbar stenosis  are also affecting patient's functional outcome.   REHAB POTENTIAL: Good  CLINICAL DECISION MAKING: Stable/uncomplicated  EVALUATION COMPLEXITY: Low   GOALS: Goals reviewed with patient? Yes   SHORT TERM GOALS: Target date: 06/09/2023    Patient will be independent in home exercise program to improve strength/mobility for better functional independence with ADLs. Baseline: to be given at session 2.  05/07/2023: need to be re-assessed Goal status: INITIAL   LONG TERM GOALS: Target date: 07/07/2023      Patient will improve Mod ODI score by 12.8 points  to demonstrate statistically significant improvement in mobility and quality of life.  Baseline: 28% 3/12: 26% 05/07/2023: 32% indicating moderate disability 5/19/: 24% Goal status: IN PROGRESS  2.  Patient (> 63 years old) will complete five times sit to stand test in < 15 seconds indicating an increased LE strength and improved balance. Baseline: 16 sec  3/12: 11.81. sec  05/07/2023: 11.09 seconds 5/19: 10.41 sec no UE support  Goal status: MET  Upgraded Goal on 05/07/2023: Patient will increase MiniBest Test score to >18/28 to indicate a reduced risk for falling and demonstrate increased independence with functional mobility and ADLs.  Baseline: 5/28: 15  Goal status: INITIAL  3.  Patient will increase 6 min walk test score by >150 to demonstrated improved access to community and improve safety.   Baseline: 1138ft 3/12: 1291ft 05/07/2023: 1278ft 5/19: 1144ft  Goal status: IN PROGRESS  4.  Patient will increase 10 meter walk test to >1.71m/s as to improve gait speed for better community ambulation and to reduce fall risk. Baseline: 0.110m/s  3/12: 1.15m/s 05/07/2023: 1.098 m/s no AD, independently  Goal status: MET  5.  Patient will reduce timed up and go to <11 seconds to reduce  fall risk and demonstrate improved transfer/gait ability. Baseline: 13sec  3/12; 12.5sec  05/07/2023: 11.01 seconds no AD, independently Goal status: MET  Upgraded Goal on 05/07/2023: Patient will increase Functional Gait Assessment (FGA) score to >20/30 as to reduce fall risk and improve dynamic gait safety with community ambulation. Baseline: 5/19: 22 Goal Status: INITIAL   6.  Patient will reduced back pain to 3/10 at worst to indicate improved function with household tasks.  Baseline: 9/10 at night.  3/12: 8/10, when waking up in the morning 05/07/2023: continues to report back pain gets as high as 8/10 when bending forward 5/19:  Goal status: IN PROGRESS  PLAN:  PT FREQUENCY: 1-2x/week  PT DURATION: 12 weeks  PLANNED INTERVENTIONS: 97110-Therapeutic exercises, 97530- Therapeutic activity, [id number]- Neuromuscular re-education, 97535- Self Care, [id number]- Manual therapy, [phone]- Gait training, 97014- Electrical stimulation (unattended), [phone]- Electrical stimulation (manual), Balance training, Stair training, Taping, Dry Needling, Joint mobilization, Joint manipulation, Spinal manipulation, Spinal mobilization, Scar mobilization, DME instructions, Cryotherapy, and Moist heat.  PLAN FOR NEXT SESSION:   Continued to manage Back pain and PD deficits. Dynamic gait and posture training.    Aurora Lees PT, DPT  Physical Therapist - [facility]  10:19 AM 06/18/23

## 2023-06-18 NOTE — Therapy (Signed)
 OUTPATIENT SPEECH LANGUAGE PATHOLOGY PARKINSON'S TREATMENT   Patient Name: Katelyn Crawford MRN: [id number] DOB:[birth year], 83 y.o., female Today's Date: 06/18/2023  PCP: Shary Deems, MD REFERRING PROVIDER: Devora Folks, MD   End of Session - 06/18/23 1021     Visit Number 7    Number of Visits 24    Date for SLP Re-Evaluation 08/04/23    SLP Start Time 0930    SLP Stop Time  1015    SLP Time Calculation (min) 45 min    Activity Tolerance Patient tolerated treatment well             Past Medical History:  Diagnosis Date   Anemia    Aortic atherosclerosis (HCC)    Arthritis    Cardiomegaly    Cardiomyopathy (HCC)    Cholelithiasis    Chronic kidney disease, stage 2 (mild)    Chronic sinus bradycardia    Coronary artery disease 03/10/2012   a.) LHC 03/10/2012: 40% mLAD, 50% pRI, 50% pRCA, 90% mRCA, 50% dRCA, 50% RPDA - unable to cannulate RCA - med mgmt; b.) LHC/PCI 06/25/2013: 30% pLAD, 40% mLAD, 20% mLCx, 30% RI, 30% pRCA, 70% mRCA (2.5 mm Promus DES), 40% dRCA-1, 90% dRCA-2 (2.5 mm Promus DES)   DDD (degenerative disc disease), thoracolumbar    Diverticulosis    Dizziness    Environmental allergies    Full dentures    GERD (gastroesophageal reflux disease) 06/25/2013   H/O bilateral cataract extraction 2019   History of diverticulosis    History of kidney stones    HLD (hyperlipidemia)    Hyperlipidemia, unspecified    Hypertension    Nephrolithiasis    Osteoporosis, post-menopausal    Parkinson disease (HCC)    Pre-diabetes    PVC's (premature ventricular contractions)    Scoliosis    Thoracic kyphosis    Trifascicular bundle branch block    a.) 1st degree AVB + RBBB + LAFB   Vascular disease    Past Surgical History:  Procedure Laterality Date   APPENDECTOMY     BREAST EXCISIONAL BIOPSY Left 1984   neg   BREAST EXCISIONAL BIOPSY Right 1997   lipoma   CATARACT EXTRACTION W/PHACO Left 09/02/2017   Procedure: CATARACT EXTRACTION PHACO AND INTRAOCULAR  LENS PLACEMENT (IOC)  LEFT;  Surgeon: Annell Kidney, MD;  Location: [facility];  Service: Ophthalmology;  Laterality: Left;   CATARACT EXTRACTION W/PHACO Right 10/01/2017   Procedure: CATARACT EXTRACTION PHACO AND INTRAOCULAR LENS PLACEMENT (IOC) RIGHT;  Surgeon: Annell Kidney, MD;  Location: [facility];  Service: Ophthalmology;  Laterality: Right;   COLONOSCOPY WITH PROPOFOL  N/A 11/27/2016   Procedure: COLONOSCOPY WITH PROPOFOL ;  Surgeon: Toledo, Alphonsus Jeans, MD;  Location: [facility];  Service: Gastroenterology;  Laterality: N/A;   CORONARY ANGIOPLASTY WITH STENT PLACEMENT Left 06/25/2013   Procedure: CORONARY ANGIOPLASTY WITH STENT PLACEMENT; Location: [facility]; Surgeon: Arletta Lager, MD   KNEE ARTHROPLASTY Left 01/22/2017   Procedure: COMPUTER ASSISTED TOTAL KNEE ARTHROPLASTY;  Surgeon: Arlyne Lame, MD;  Location: [facility];  Service: Orthopedics;  Laterality: Left;   KNEE ARTHROSCOPY Left 02/01/2015   Procedure: ARTHROSCOPY KNEE, partial medial & lateral menisectomy,,medial and patelofemoral chondroplasty;  Surgeon: Arlyne Lame, MD;  Location: [facility];  Service: Orthopedics;  Laterality: Left;   LEFT HEART CATH AND CORONARY ANGIOGRAPHY Left 03/10/2012   Procedure: LEFT HEART CATH AND CORONARY ANGIOGRAPHY; Location: [facility]; Surgeon: Percival Brace, MD   LITHOTRIPSY Right 1995   RIGHT AXILLARY LIPOMA REMOVAL  TUBAL LIGATION     VARICOSE VEIN SURGERY     Patient Active Problem List   Diagnosis Date Noted   Primary osteoarthritis of hip 02/09/2017   Primary osteoarthritis of knee 02/09/2017   Chronic sinus bradycardia 01/22/2017   Degenerative disc disease, lumbar 01/22/2017   Dizziness 01/22/2017   Nephrolithiasis 01/22/2017   Osteoporosis, 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/22/2017   PVC's (premature ventricular contractions) 01/22/2017   S/P total knee arthroplasty 01/22/2017   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  Pt accompanied by: self  PERTINENT HISTORY: Pt is an 83 y.o. 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 Pt with PMHx GERD, HTN, CAD, and osteoathritis. Per chart review, pt previously participated in LSVT in 2023. Noted, MBS on 04/12/20 with findings of mild oral dysphagia. Regular diet and thin liquids was recommended at that time.   DIAGNOSTIC FINDINGS: MRI brain, 10/26/22, "No acute finding by MRI. Moderate chronic small-vessel ischemic changes of the cerebral hemispheric white matter, which is considerably progressive when compared to the study of 2019."  PAIN:  Are you having pain? No  FALLS: Has patient fallen in last 6 months?  See PT evaluation for details  LIVING ENVIRONMENT: Lives with: lives alone; daughter lives next door   PLOF:  Level of assistance: Independent with ADLs Employment: Retired  PATIENT GOALS    for family to hear me  OBJECTIVE:  TODAY'S TREATMENT:  Pt seen for intensity based Parkinson's treatment. Reviewed HEP. Voice ex's completed as follows: Sustained /a/: x10, 84dB Ascending pitch glides: x10, 85dB Descending pitch glides: x10, 84db   Pt read x10 functional phrases twice through with loud and intentional voice, averaging 83 dB.    During 15 minute speech sample, pt averaged 77dB.   Pt benefited from use of biofeeback using Voice Analyst app.    Pt educated re:  changes to speech/voice due to Parkinson's Disease, voice ex's, SLP POC, and HEP.   PATIENT EDUCATION: Education details: as above Person educated: Patient Education method: Explanation Education comprehension: verbalized understanding and needs further education   HOME EXERCISE PROGRAM: As above (vocal ex's, reading)   GOALS: Goals reviewed with patient? Yes  SHORT TERM GOALS: Target date: 10 sessions  Pt will participate in further cognitive-linguistic assessment with additional goals, as appropriate. Baseline: Goal status: INITIAL  2.  The patient will complete Daily Tasks (Maximum duration "ah", High/Lows, and Functional Phrases) at average loudness >/= 80 dB and with loud, good quality voice with min cues.  Baseline:  Goal status: INITIAL  3.  The patient will complete Hierarchal Speech Loudness reading drills (words/phrases, sentences) at average >/= 75 dB and with loud, good quality voice with min cues.  Baseline:  Goal status: INITIAL  4.  The patient will participate in 5-8 minutes conversation, maintaining average loudness of 75 dB and good quality voice with modified independence.  Baseline:  Goal status: INITIAL    LONG TERM GOALS: Target date: 12 weeks  The patient will complete Daily  Tasks (Maximum duration "ah", High/Lows, and Functional Phrases) at average loudness >/= 80 dB and with loud, good quality voice.  Baseline:  Goal status: INITIAL  2.  The patient will complete Hierarchal Speech Loudness reading drills (words/phrases, sentences, and paragraph) at average >/= 75 dB and with loud, good quality voice.  Baseline:  Goal status: INITIAL  3.  The patient will participate in 20 minutes conversation, maintaining average loudness of >/= 75 dB and loud, good quality voice.  Baseline:  Goal status: INITIAL  4.  The patient will report improved communication effectiveness as measured by the Communication Effectiveness Survey.  Baseline: 05/12/23:  12/32 Goal status: INITIAL    ASSESSMENT:  CLINICAL IMPRESSION: Pt is an 83 y.o. 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 Pt with PMHx GERD, HTN, CAD, and osteoathritis. Per chart review, pt previously participated in LSVT in 2023. Recent assessment completed via informal means and PROM. 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 Patient reports requests to repeat herself from friends and family, and has noticed a decline in her vocal quality over several years. Patient did exhibit some strain during phonation tasks with stimulability testing, but was stimulable for improved loudness, pitch variation and vocal quality in reading and conversation using model and cues for "Loud, like your talking to a crowd." See details of tx session above. Recommend course of intensity based ST to maximize pt's vocal quality and intelligibility for conversations with family and friends to reduce social isolation and improve quality of life.  OBJECTIVE IMPAIRMENTS include dysarthria. These impairments are limiting patient from effectively communicating at home and in community. Factors affecting potential to achieve goals and functional outcome are co-morbidities. Patient will benefit from skilled SLP services to address above impairments and improve overall function.  REHAB POTENTIAL: Good  PLAN: SLP FREQUENCY: 2x/week  SLP DURATION: 12 weeks  PLANNED INTERVENTIONS: Cueing hierachy, Internal/external aids, Functional tasks, SLP instruction and feedback, Compensatory strategies, and Patient/family education   Dia Forget, M.S., CCC-SLP Speech-Language Pathologist [facility] [phone] [place])   [facility] at [facility] [address] [place], [place], [address] Phone: [phone]   Fax:  [phone]

## 2023-06-23 ENCOUNTER — Ambulatory Visit: Payer: Medicare Other | Admitting: Physical Therapy

## 2023-06-23 ENCOUNTER — Ambulatory Visit

## 2023-06-25 ENCOUNTER — Ambulatory Visit

## 2023-06-25 ENCOUNTER — Ambulatory Visit: Payer: Medicare Other | Attending: Family Medicine

## 2023-06-25 DIAGNOSIS — M6281 Muscle weakness (generalized): Secondary | ICD-10-CM | POA: Insufficient documentation

## 2023-06-25 DIAGNOSIS — R2681 Unsteadiness on feet: Secondary | ICD-10-CM | POA: Diagnosis present

## 2023-06-25 DIAGNOSIS — R2689 Other abnormalities of gait and mobility: Secondary | ICD-10-CM | POA: Insufficient documentation

## 2023-06-25 DIAGNOSIS — R41841 Cognitive communication deficit: Secondary | ICD-10-CM | POA: Diagnosis present

## 2023-06-25 DIAGNOSIS — R471 Dysarthria and anarthria: Secondary | ICD-10-CM

## 2023-06-25 DIAGNOSIS — M546 Pain in thoracic spine: Secondary | ICD-10-CM | POA: Diagnosis present

## 2023-06-25 DIAGNOSIS — R278 Other lack of coordination: Secondary | ICD-10-CM | POA: Insufficient documentation

## 2023-06-25 DIAGNOSIS — R262 Difficulty in walking, not elsewhere classified: Secondary | ICD-10-CM | POA: Diagnosis present

## 2023-06-25 DIAGNOSIS — G20A1 Parkinson's disease without dyskinesia, without mention of fluctuations: Secondary | ICD-10-CM | POA: Diagnosis present

## 2023-06-25 DIAGNOSIS — G8929 Other chronic pain: Secondary | ICD-10-CM | POA: Diagnosis present

## 2023-06-25 NOTE — Therapy (Signed)
 OUTPATIENT PHYSICAL THERAPY THORACOLUMBAR TREATMENT/      Patient Name: Katelyn Crawford MRN: [id number] DOB:09/11/1940, 83 y.o., female Today's Date: 06/26/2023   END OF SESSION:   PT End of Session - 06/25/23 1103     Visit Number 33    Number of Visits 38    Date for PT Re-Evaluation 07/07/23    Progress Note Due on Visit 40    PT Start Time 1058    PT Stop Time 1141    PT Time Calculation (min) 43 min    Equipment Utilized During Treatment Gait belt    Activity Tolerance Patient tolerated treatment well    Behavior During Therapy WFL for tasks assessed/performed                  Past Medical History:  Diagnosis Date   Anemia    Aortic atherosclerosis (HCC)    Arthritis    Cardiomegaly    Cardiomyopathy (HCC)    Cholelithiasis    Chronic kidney disease, stage 2 (mild)    Chronic sinus bradycardia    Coronary artery disease 03/10/2012   a.) LHC 03/10/2012: 40% mLAD, 50% pRI, 50% pRCA, 90% mRCA, 50% dRCA, 50% RPDA - unable to cannulate RCA - med mgmt; b.) LHC/PCI 06/25/2013: 30% pLAD, 40% mLAD, 20% mLCx, 30% RI, 30% pRCA, 70% mRCA (2.5 mm Promus DES), 40% dRCA-1, 90% dRCA-2 (2.5 mm Promus DES)   DDD (degenerative disc disease), thoracolumbar    Diverticulosis    Dizziness    Environmental allergies    Full dentures    GERD (gastroesophageal reflux disease) 06/25/2013   H/O bilateral cataract extraction 2019   History of diverticulosis    History of kidney stones    HLD (hyperlipidemia)    Hyperlipidemia, unspecified    Hypertension    Nephrolithiasis    Osteoporosis, post-menopausal    Parkinson disease (HCC)    Pre-diabetes    PVC's (premature ventricular contractions)    Scoliosis    Thoracic kyphosis    Trifascicular bundle branch block    a.) 1st degree AVB + RBBB + LAFB   Vascular disease    Past Surgical History:  Procedure Laterality Date   APPENDECTOMY     BREAST EXCISIONAL BIOPSY Left 1984   neg   BREAST EXCISIONAL BIOPSY Right 1997    lipoma   CATARACT EXTRACTION W/PHACO Left 09/02/2017   Procedure: CATARACT EXTRACTION PHACO AND INTRAOCULAR LENS PLACEMENT (IOC)  LEFT;  Surgeon: Annell Kidney, MD;  Location: [facility];  Service: Ophthalmology;  Laterality: Left;   CATARACT EXTRACTION W/PHACO Right 10/01/2017   Procedure: CATARACT EXTRACTION PHACO AND INTRAOCULAR LENS PLACEMENT (IOC) RIGHT;  Surgeon: Annell Kidney, MD;  Location: [facility];  Service: Ophthalmology;  Laterality: Right;   COLONOSCOPY WITH PROPOFOL  N/A 11/27/2016   Procedure: COLONOSCOPY WITH PROPOFOL ;  Surgeon: Toledo, Alphonsus Jeans, MD;  Location: [facility];  Service: Gastroenterology;  Laterality: N/A;   CORONARY ANGIOPLASTY WITH STENT PLACEMENT Left 06/25/2013   Procedure: CORONARY ANGIOPLASTY WITH STENT PLACEMENT; Location: [facility]; Surgeon: Arletta Lager, MD   KNEE ARTHROPLASTY Left 01/22/2017   Procedure: COMPUTER ASSISTED TOTAL KNEE ARTHROPLASTY;  Surgeon: Arlyne Lame, MD;  Location: [facility];  Service: Orthopedics;  Laterality: Left;   KNEE ARTHROSCOPY Left 02/01/2015   Procedure: ARTHROSCOPY KNEE, partial medial & lateral menisectomy,,medial and patelofemoral chondroplasty;  Surgeon: Arlyne Lame, MD;  Location: [facility];  Service: Orthopedics;  Laterality: Left;   LEFT HEART CATH AND CORONARY ANGIOGRAPHY Left 03/10/2012  Procedure: LEFT HEART CATH AND CORONARY ANGIOGRAPHY; Location: [facility]; Surgeon: Percival Brace, MD   LITHOTRIPSY Right 1995   RIGHT AXILLARY LIPOMA REMOVAL     TUBAL LIGATION     VARICOSE VEIN SURGERY     Patient Active Problem List   Diagnosis Date Noted   Primary osteoarthritis of hip 02/09/2017   Primary osteoarthritis of knee 02/09/2017   Chronic sinus bradycardia 01/22/2017   Degenerative disc disease, lumbar 01/22/2017   Dizziness 01/22/2017   Nephrolithiasis 01/22/2017   Osteoporosis, 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/22/2017   PVC's (premature ventricular contractions) 01/22/2017   S/P total knee  arthroplasty 01/22/2017   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   CKD (chronic kidney disease), 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   GERD (gastroesophageal reflux disease) 06/25/2013   Coronary artery disease involving native coronary artery of native heart 06/24/2013   Essential hypertension 06/21/2013   Mixed hyperlipidemia 06/21/2013    PCP: Yehuda Helms, MD   REFERRING PROVIDER: Meeler, Driscilla George, FNP  REFERRING DIAG:  Diagnosis  [id number] (ICD-10-CM) - Lumbar stenosis with neurogenic claudication  M54.14 (ICD-10-CM) - Thoracic radiculitis    Rationale for Evaluation and Treatment: Rehabilitation  THERAPY DIAG:  Muscle weakness (generalized)  Unsteadiness on feet  Other abnormalities of gait and mobility  Difficulty in walking, not elsewhere classified  Chronic bilateral thoracic back pain  Other lack of coordination  ONSET DATE: >3 months   SUBJECTIVE:                                                                                                                                                                                           SUBJECTIVE STATEMENT:   Patient reports doing okay today- Reports some "mild" pain in mid back   PERTINENT HISTORY:  Hx of PD and familiar to this clinic for PD management.  From recent MD appointment:  "The patient is a pleasant 83 year old female who presents today for acute on chronic bilateral low back pain without radiation to the lower extremities. She describes pain that began June 2020 without injury. She also states that she has had some form of back pain for many years. She describes her pain as mild to moderate that is sharp and intermittent. Bending and lifting increases her pain. Rest can help alleviate her pain. She participated in physical therapy at [facility] clinic in early 2020 with little relief. She continues to stay active in her home and notices that household activities increase her pain such as vacuuming and mopping.  Medications include Tylenol  (mild to moderate relief).  She was last evaluated on 10/09/2022 at which time she was doing well. She was to continue with heat  and home cycling along with physical therapy exercises. She was to continue with tramadol  nightly as needed.  At today's visit she reports that she is experience of increasing pain in the morning in her mid back about the bra line. She would like to return to physical therapy and she states that this was very helpful in managing her pain. We discussed if her pain was to continue consideration would be made for another epidural. She states that she is taking tramadol  at night and doing well with this. Denies any injury. Upon palpation today she is without tenderness to the thoracic paraspinal musculature. "  PAIN:  Are you having pain? Yes: NPRS scale: 0/10 Pain location: middle back Pain description: ache Aggravating factors: sleeping Relieving factors: tramadol    PRECAUTIONS: Fall   RED FLAGS: None   WEIGHT BEARING RESTRICTIONS: No  FALLS:  Has patient fallen in last 6 months? No  LIVING ENVIRONMENT: Lives with: lives alone and daughter lives next door. Lives in: House/apartment Stairs: Yes: External: 4 steps; on right going up and on left going up Has following equipment at home:  None, but has use cane in the past. Has shower rails, shower chair; doesn't use  OCCUPATION: retired   PLOF: Independent, Independent with basic ADLs, and Independent with gait  PATIENT GOALS: stronger. Improve mobility   NEXT MD VISIT: unsure   OBJECTIVE:  Note: Objective measures were completed at Evaluation unless otherwise noted.  DIAGNOSTIC FINDINGS:  No recent imaging   PATIENT SURVEYS:  Modified Oswestry 28%   COGNITION: Overall cognitive status: Within functional limits for tasks assessed     SENSATION: WFL  MUSCLE LENGTH: WFL  POSTURE: rounded shoulders, forward head, and decreased lumbar lordosis  PALPATION: Noted  tightness in upper thoracic paraspinal. No pain to palpation   LUMBAR ROM:   AROM eval  Flexion WFL  Extension 20  Right lateral flexion   Left lateral flexion   Right rotation minimal  Left rotation ~10 deg   Cervical  AROM eval  Flexion 45  Extension 18  Right lateral flexion 20  Left lateral flexion 12  Right rotation 50  Left rotation  40  Thoracic  AROM eval  Flexion WFL  Extension 10  Right lateral flexion 25  Left lateral flexion 20  Right rotation minimal  Left rotation ~10 deg      (Blank rows = not tested)  LOWER EXTREMITY ROM:     Grossly WFL  LOWER EXTREMITY MMT:    MMT Right eval Left eval  Hip flexion 4- 4-  Hip extension    Hip abduction 4- 4-  Hip adduction 4 4  Hip internal rotation    Hip external rotation    Knee flexion 4 4  Knee extension 4+ 4+  Ankle dorsiflexion 4 4  Ankle plantarflexion    Ankle inversion    Ankle eversion     (Blank rows = not tested)    FUNCTIONAL TESTS:  5 times sit to stand: 16  Timed up and go (TUG): 12.3 sce 6 minute walk test: 1180ft 10 meter walk test: 0.3m/s Functional gait assessment: 21  4/21: miniBest: 19/28   GAIT: Distance walked: 70 Assistive device utilized: None Level of assistance: Complete Independence Comments: flexed posture.   TREATMENT DATE: 06/26/2023  NMR:  Nustep BLE/BUE reciprocal movement training interval resistance level 1-5 for total of 6 min.   Step tap onto 6" block without UE support x 20 reps alt LE Fwd/Bwd step up/over 1/2 foam  roll x 20 reps each (min difficulty with retro step)  Side step over back 1/2 foam roll x 20 reps each w/o UE support  (VC and min diffiuclty taking wide step to clear both feet)  Static stand on airex pad with EO and feet apart x 30 sec then progressed to EC x 30 sec (mild unsteadiness - CGA Only)  Static stand (front foot on 6" block and rear foot on airex pad) x 30 sec each side Dynamic march in place on airex pad (VC to perform slower  to incorporate more gluteal activation and focus on balance)  x 20 reps alt LE Static stand at wall- Focusing on erect posture x 60 sec x 3 Standing at wall - scap retraction x 10 reps  (min reports of mid back soreness)    PT provided supervision assist throughout session with CGA with forward step with BUE reach.   PATIENT EDUCATION:  Education details:exercise technique, large-amplitude training/purpose, techniques for donning jacket, finger flicks Person educated: Patient Education method: IT trainer, VC Education comprehension: verbalized understanding, returned demo  HOME EXERCISE PROGRAM: Access Code: [email or web address] URL: [email or web address] Date: 02/20/2023 Prepared by: Aurora Lees  Exercises - Sit to Stand with Arm Swing  - 1 x daily - 7 x weekly - 2 sets - 10 reps - 2 hold - Seated Reach Forward, Up, and To Sides  - 1 x daily - 7 x weekly - 3 sets - 10 reps - Seated Reaching to Side and Across Body  - 1 x daily - 7 x weekly - 3 sets - 10 reps - Standing Row with Anchored Resistance  - 1 x daily - 7 x weekly - 3 sets - 10 reps  Access Code: [facility] URL: [email or web address] Date: 05/28/2023 Prepared by: Aurora Lees  Exercises - Step Sideways with Arms Reaching  - 1 x daily - 7 x weekly - 3 sets - 10 reps - 2 hold - Staggered Stance Weight Shift with Arms Reaching  - 1 x daily - 7 x weekly - 3 sets - 10 reps - 2 hold - Standing Reach to Opposite Side with Weight Shift  - 1 x daily - 7 x weekly - 3 sets - 10 reps - 2 hold  ASSESSMENT:  CLINICAL IMPRESSION:  Pt arrives highly motivated to participate in therapy session. Patient reported mild mid back pain yet no worse during visit. She was challenged with balance activities- mild unsteadiness with narrowed standing or activities involving some single limb standing. She demonstrated some in session progress and adaption to task as seen by less unsteadiness with practice today.  Pt  will  benefit from continued skilled PT to address back pain improve strength, increase endurance, and improve overall functional mobility.   OBJECTIVE IMPAIRMENTS: Abnormal gait, cardiopulmonary status limiting activity, decreased activity tolerance, decreased balance, decreased endurance, decreased mobility, difficulty walking, decreased ROM, decreased strength, hypomobility, increased fascial restrictions, impaired perceived functional ability, impaired flexibility, impaired UE functional use, improper body mechanics, and postural dysfunction.   ACTIVITY LIMITATIONS: carrying, squatting, sleeping, and locomotion level  PARTICIPATION LIMITATIONS: cleaning, laundry, shopping, and community activity  PERSONAL FACTORS: Age, Fitness, and 1-2 comorbidities: lumbar stenosis  are also affecting patient's functional outcome.   REHAB POTENTIAL: Good  CLINICAL DECISION MAKING: Stable/uncomplicated  EVALUATION COMPLEXITY: Low   GOALS: Goals reviewed with patient? Yes   SHORT TERM GOALS: Target date: 06/09/2023    Patient will be independent in home exercise program to improve strength/mobility for better functional independence with  ADLs. Baseline: to be given at session 2.  05/07/2023: need to be re-assessed Goal status: INITIAL   LONG TERM GOALS: Target date: 07/07/2023      Patient will improve Mod ODI score by 12.8 points  to demonstrate statistically significant improvement in mobility and quality of life.  Baseline: 28% 3/12: 26% 05/07/2023: 32% indicating moderate disability 5/19/: 24% Goal status: IN PROGRESS  2.  Patient (> 28 years old) will complete five times sit to stand test in < 15 seconds indicating an increased LE strength and improved balance. Baseline: 16 sec  3/12: 11.81. sec  05/07/2023: 11.09 seconds 5/19: 10.41 sec no UE support  Goal status: MET  Upgraded Goal on 05/07/2023: Patient will increase MiniBest Test score to >18/28 to indicate a reduced risk for  falling and demonstrate increased independence with functional mobility and ADLs.  Baseline: 5/28: 15  Goal status: INITIAL  3.  Patient will increase 6 min walk test score by >150 to demonstrated improved access to community and improve safety.   Baseline: 1128ft 3/12: 1259ft 05/07/2023: 1271ft 5/19: 1144ft  Goal status: IN PROGRESS  4.  Patient will increase 10 meter walk test to >1.33m/s as to improve gait speed for better community ambulation and to reduce fall risk. Baseline: 0.71m/s  3/12: 1.76m/s 05/07/2023: 1.098 m/s no AD, independently  Goal status: MET  5.  Patient will reduce timed up and go to <11 seconds to reduce fall risk and demonstrate improved transfer/gait ability. Baseline: 13sec  3/12; 12.5sec  05/07/2023: 11.01 seconds no AD, independently Goal status: MET  Upgraded Goal on 05/07/2023: Patient will increase Functional Gait Assessment (FGA) score to >20/30 as to reduce fall risk and improve dynamic gait safety with community ambulation. Baseline: 5/19: 22 Goal Status: INITIAL   6.  Patient will reduced back pain to 3/10 at worst to indicate improved function with household tasks.  Baseline: 9/10 at night.  3/12: 8/10, when waking up in the morning 05/07/2023: continues to report back pain gets as high as 8/10 when bending forward 5/19:  Goal status: IN PROGRESS  PLAN:  PT FREQUENCY: 1-2x/week  PT DURATION: 12 weeks  PLANNED INTERVENTIONS: 97110-Therapeutic exercises, 97530- Therapeutic activity, 97112- Neuromuscular re-education, 97535- Self Care, 16109- Manual therapy, 97116- Gait training, 97014- Electrical stimulation (unattended), [phone]- Electrical stimulation (manual), Balance training, Stair training, Taping, Dry Needling, Joint mobilization, Joint manipulation, Spinal manipulation, Spinal mobilization, Scar mobilization, DME instructions, Cryotherapy, and Moist heat.  PLAN FOR NEXT SESSION:   Continued to manage Back pain and PD deficits. Dynamic gait  and posture training.    Ossie Blend, PT Physical Therapist - [facility]  9:55 AM 06/26/23

## 2023-06-25 NOTE — Therapy (Signed)
 OUTPATIENT SPEECH LANGUAGE PATHOLOGY PARKINSON'S TREATMENT   Patient Name: Katelyn Crawford MRN: [id number] DOB:1940/10/25, 83 y.o., female Today's Date: 06/25/2023  PCP: Shary Deems, MD REFERRING PROVIDER: Devora Folks, MD   End of Session - 06/25/23 1145     Visit Number 8    Number of Visits 24    Date for SLP Re-Evaluation 08/04/23    SLP Start Time 1145    SLP Stop Time  1230    SLP Time Calculation (min) 45 min    Activity Tolerance Patient tolerated treatment well             Past Medical History:  Diagnosis Date   Anemia    Aortic atherosclerosis (HCC)    Arthritis    Cardiomegaly    Cardiomyopathy (HCC)    Cholelithiasis    Chronic kidney disease, stage 2 (mild)    Chronic sinus bradycardia    Coronary artery disease 03/10/2012   a.) LHC 03/10/2012: 40% mLAD, 50% pRI, 50% pRCA, 90% mRCA, 50% dRCA, 50% RPDA - unable to cannulate RCA - med mgmt; b.) LHC/PCI 06/25/2013: 30% pLAD, 40% mLAD, 20% mLCx, 30% RI, 30% pRCA, 70% mRCA (2.5 mm Promus DES), 40% dRCA-1, 90% dRCA-2 (2.5 mm Promus DES)   DDD (degenerative disc disease), thoracolumbar    Diverticulosis    Dizziness    Environmental allergies    Full dentures    GERD (gastroesophageal reflux disease) 06/25/2013   H/O bilateral cataract extraction 2019   History of diverticulosis    History of kidney stones    HLD (hyperlipidemia)    Hyperlipidemia, unspecified    Hypertension    Nephrolithiasis    Osteoporosis, post-menopausal    Parkinson disease (HCC)    Pre-diabetes    PVC's (premature ventricular contractions)    Scoliosis    Thoracic kyphosis    Trifascicular bundle branch block    a.) 1st degree AVB + RBBB + LAFB   Vascular disease    Past Surgical History:  Procedure Laterality Date   APPENDECTOMY     BREAST EXCISIONAL BIOPSY Left 1984   neg   BREAST EXCISIONAL BIOPSY Right 1997   lipoma   CATARACT EXTRACTION W/PHACO Left 09/02/2017   Procedure: CATARACT EXTRACTION PHACO AND INTRAOCULAR  LENS PLACEMENT (IOC)  LEFT;  Surgeon: Annell Kidney, MD;  Location: [facility];  Service: Ophthalmology;  Laterality: Left;   CATARACT EXTRACTION W/PHACO Right 10/01/2017   Procedure: CATARACT EXTRACTION PHACO AND INTRAOCULAR LENS PLACEMENT (IOC) RIGHT;  Surgeon: Annell Kidney, MD;  Location: [facility];  Service: Ophthalmology;  Laterality: Right;   COLONOSCOPY WITH PROPOFOL  N/A 11/27/2016   Procedure: COLONOSCOPY WITH PROPOFOL ;  Surgeon: Toledo, Alphonsus Jeans, MD;  Location: [facility];  Service: Gastroenterology;  Laterality: N/A;   CORONARY ANGIOPLASTY WITH STENT PLACEMENT Left 06/25/2013   Procedure: CORONARY ANGIOPLASTY WITH STENT PLACEMENT; Location: [facility]; Surgeon: Arletta Lager, MD   KNEE ARTHROPLASTY Left 01/22/2017   Procedure: COMPUTER ASSISTED TOTAL KNEE ARTHROPLASTY;  Surgeon: Arlyne Lame, MD;  Location: [facility];  Service: Orthopedics;  Laterality: Left;   KNEE ARTHROSCOPY Left 02/01/2015   Procedure: ARTHROSCOPY KNEE, partial medial & lateral menisectomy,,medial and patelofemoral chondroplasty;  Surgeon: Arlyne Lame, MD;  Location: [facility];  Service: Orthopedics;  Laterality: Left;   LEFT HEART CATH AND CORONARY ANGIOGRAPHY Left 03/10/2012   Procedure: LEFT HEART CATH AND CORONARY ANGIOGRAPHY; Location: [facility]; Surgeon: Percival Brace, MD   LITHOTRIPSY Right 1995   RIGHT AXILLARY LIPOMA REMOVAL  TUBAL LIGATION     VARICOSE VEIN SURGERY     Patient Active Problem List   Diagnosis Date Noted   Primary osteoarthritis of hip 02/09/2017   Primary osteoarthritis of knee 02/09/2017   Chronic sinus bradycardia 01/22/2017   Degenerative disc disease, lumbar 01/22/2017   Dizziness 01/22/2017   Nephrolithiasis 01/22/2017   Osteoporosis, 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/22/2017   PVC's (premature ventricular contractions) 01/22/2017   S/P total knee arthroplasty 01/22/2017   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  Pt accompanied by: self  PERTINENT HISTORY: Pt is an 83 y.o. 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 Pt with PMHx GERD, HTN, CAD, and osteoathritis. Per chart review, pt previously participated in LSVT in 2023. Noted, MBS on 04/12/20 with findings of mild oral dysphagia. Regular diet and thin liquids was recommended at that time.   DIAGNOSTIC FINDINGS: MRI brain, 10/26/22, "No acute finding by MRI. Moderate chronic small-vessel ischemic changes of the cerebral hemispheric white matter, which is considerably progressive when compared to the study of 2019."  PAIN:  Are you having pain? No  FALLS: Has patient fallen in last 6 months?  See PT evaluation for details  LIVING ENVIRONMENT: Lives with: lives alone; daughter lives next door   PLOF:  Level of assistance: Independent with ADLs Employment: Retired  PATIENT GOALS    for family to hear me  OBJECTIVE:  TODAY'S TREATMENT:  Pt seen for intensity based Parkinson's treatment. Reviewed HEP. Voice ex's completed as follows: Sustained /a/: x10, 85dB Ascending pitch glides: x10, 85dB Descending pitch glides: x10, 85db   Pt read x10 functional phrases twice through with loud and intentional voice, averaging 83 dB.    During 15 minute speech sample, pt averaged 77dB.   Pt benefited from use of biofeeback using Voice Analyst app.    Pt educated re:  changes to speech/voice due to Parkinson's Disease, voice ex's, SLP POC, and HEP.   PATIENT EDUCATION: Education details: as above Person educated: Patient Education method: Explanation Education comprehension: verbalized understanding and needs further education   HOME EXERCISE PROGRAM: As above (vocal ex's, reading)   GOALS: Goals reviewed with patient? Yes  SHORT TERM GOALS: Target date: 10 sessions  Pt will participate in further cognitive-linguistic assessment with additional goals, as appropriate. Baseline: Goal status: INITIAL  2.  The patient will complete Daily Tasks (Maximum duration "ah", High/Lows, and Functional Phrases) at average loudness >/= 80 dB and with loud, good quality voice with min cues.  Baseline:  Goal status: INITIAL  3.  The patient will complete Hierarchal Speech Loudness reading drills (words/phrases, sentences) at average >/= 75 dB and with loud, good quality voice with min cues.  Baseline:  Goal status: INITIAL  4.  The patient will participate in 5-8 minutes conversation, maintaining average loudness of 75 dB and good quality voice with modified independence.  Baseline:  Goal status: INITIAL    LONG TERM GOALS: Target date: 12 weeks  The patient will complete Daily  Tasks (Maximum duration "ah", High/Lows, and Functional Phrases) at average loudness >/= 80 dB and with loud, good quality voice.  Baseline:  Goal status: INITIAL  2.  The patient will complete Hierarchal Speech Loudness reading drills (words/phrases, sentences, and paragraph) at average >/= 75 dB and with loud, good quality voice.  Baseline:  Goal status: INITIAL  3.  The patient will participate in 20 minutes conversation, maintaining average loudness of >/= 75 dB and loud, good quality voice.  Baseline:  Goal status: INITIAL  4.  The patient will report improved communication effectiveness as measured by the Communication Effectiveness Survey.  Baseline: 05/12/23:  12/32 Goal status: INITIAL    ASSESSMENT:  CLINICAL IMPRESSION: Pt is an 83 y.o. 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 Pt with PMHx GERD, HTN, CAD, and osteoathritis. Per chart review, pt previously participated in LSVT in 2023. Recent assessment completed via informal means and PROM. 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 Patient reports requests to repeat herself from friends and family, and has noticed a decline in her vocal quality over several years. Patient did exhibit some strain during phonation tasks with stimulability testing, but was stimulable for improved loudness, pitch variation and vocal quality in reading and conversation using model and cues for "Loud, like your talking to a crowd." See details of tx session above. Recommend course of intensity based ST to maximize pt's vocal quality and intelligibility for conversations with family and friends to reduce social isolation and improve quality of life.  OBJECTIVE IMPAIRMENTS include dysarthria. These impairments are limiting patient from effectively communicating at home and in community. Factors affecting potential to achieve goals and functional outcome are co-morbidities. Patient will benefit from skilled SLP services to address above impairments and improve overall function.  REHAB POTENTIAL: Good  PLAN: SLP FREQUENCY: 2x/week  SLP DURATION: 12 weeks  PLANNED INTERVENTIONS: Cueing hierachy, Internal/external aids, Functional tasks, SLP instruction and feedback, Compensatory strategies, and Patient/family education   Dia Forget, M.S., CCC-SLP Speech-Language Pathologist [place] [facility] [phone] [place])   [facility] [address] [place], [place], [address] Phone: [phone]   Fax:  [phone]

## 2023-06-30 ENCOUNTER — Ambulatory Visit: Payer: Medicare Other

## 2023-06-30 ENCOUNTER — Ambulatory Visit

## 2023-06-30 DIAGNOSIS — M6281 Muscle weakness (generalized): Secondary | ICD-10-CM

## 2023-06-30 DIAGNOSIS — G8929 Other chronic pain: Secondary | ICD-10-CM

## 2023-06-30 DIAGNOSIS — R278 Other lack of coordination: Secondary | ICD-10-CM

## 2023-06-30 DIAGNOSIS — R2681 Unsteadiness on feet: Secondary | ICD-10-CM

## 2023-06-30 DIAGNOSIS — G20A1 Parkinson's disease without dyskinesia, without mention of fluctuations: Secondary | ICD-10-CM

## 2023-06-30 DIAGNOSIS — R471 Dysarthria and anarthria: Secondary | ICD-10-CM

## 2023-06-30 DIAGNOSIS — R2689 Other abnormalities of gait and mobility: Secondary | ICD-10-CM

## 2023-06-30 DIAGNOSIS — R262 Difficulty in walking, not elsewhere classified: Secondary | ICD-10-CM

## 2023-06-30 NOTE — Therapy (Signed)
 OUTPATIENT SPEECH LANGUAGE PATHOLOGY PARKINSON'S TREATMENT   Patient Name: Katelyn Crawford MRN: [id number] DOB:1940-07-18, 83 y.o., female Today's Date: 06/30/2023  PCP: Shary Deems, MD REFERRING PROVIDER: Devora Folks, MD   End of Session - 06/30/23 1115     Visit Number 9    Number of Visits 24    Date for SLP Re-Evaluation 08/04/23    SLP Start Time 1015    SLP Stop Time  1100    SLP Time Calculation (min) 45 min    Activity Tolerance Patient tolerated treatment well             Past Medical History:  Diagnosis Date   Anemia    Aortic atherosclerosis (HCC)    Arthritis    Cardiomegaly    Cardiomyopathy (HCC)    Cholelithiasis    Chronic kidney disease, stage 2 (mild)    Chronic sinus bradycardia    Coronary artery disease 03/10/2012   a.) LHC 03/10/2012: 40% mLAD, 50% pRI, 50% pRCA, 90% mRCA, 50% dRCA, 50% RPDA - unable to cannulate RCA - med mgmt; b.) LHC/PCI 06/25/2013: 30% pLAD, 40% mLAD, 20% mLCx, 30% RI, 30% pRCA, 70% mRCA (2.5 mm Promus DES), 40% dRCA-1, 90% dRCA-2 (2.5 mm Promus DES)   DDD (degenerative disc disease), thoracolumbar    Diverticulosis    Dizziness    Environmental allergies    Full dentures    GERD (gastroesophageal reflux disease) 06/25/2013   H/O bilateral cataract extraction 2019   History of diverticulosis    History of kidney stones    HLD (hyperlipidemia)    Hyperlipidemia, unspecified    Hypertension    Nephrolithiasis    Osteoporosis, post-menopausal    Parkinson disease (HCC)    Pre-diabetes    PVC's (premature ventricular contractions)    Scoliosis    Thoracic kyphosis    Trifascicular bundle branch block    a.) 1st degree AVB + RBBB + LAFB   Vascular disease    Past Surgical History:  Procedure Laterality Date   APPENDECTOMY     BREAST EXCISIONAL BIOPSY Left 1984   neg   BREAST EXCISIONAL BIOPSY Right 1997   lipoma   CATARACT EXTRACTION W/PHACO Left 09/02/2017   Procedure: CATARACT EXTRACTION PHACO AND INTRAOCULAR  LENS PLACEMENT (IOC)  LEFT;  Surgeon: Annell Kidney, MD;  Location: [facility];  Service: Ophthalmology;  Laterality: Left;   CATARACT EXTRACTION W/PHACO Right 10/01/2017   Procedure: CATARACT EXTRACTION PHACO AND INTRAOCULAR LENS PLACEMENT (IOC) RIGHT;  Surgeon: Annell Kidney, MD;  Location: [facility];  Service: Ophthalmology;  Laterality: Right;   COLONOSCOPY WITH PROPOFOL  N/A 11/27/2016   Procedure: COLONOSCOPY WITH PROPOFOL ;  Surgeon: Toledo, Alphonsus Jeans, MD;  Location: [facility];  Service: Gastroenterology;  Laterality: N/A;   CORONARY ANGIOPLASTY WITH STENT PLACEMENT Left 06/25/2013   Procedure: CORONARY ANGIOPLASTY WITH STENT PLACEMENT; Location: [facility]; Surgeon: Arletta Lager, MD   KNEE ARTHROPLASTY Left 01/22/2017   Procedure: COMPUTER ASSISTED TOTAL KNEE ARTHROPLASTY;  Surgeon: Arlyne Lame, MD;  Location: [facility];  Service: Orthopedics;  Laterality: Left;   KNEE ARTHROSCOPY Left 02/01/2015   Procedure: ARTHROSCOPY KNEE, partial medial & lateral menisectomy,,medial and patelofemoral chondroplasty;  Surgeon: Arlyne Lame, MD;  Location: [facility];  Service: Orthopedics;  Laterality: Left;   LEFT HEART CATH AND CORONARY ANGIOGRAPHY Left 03/10/2012   Procedure: LEFT HEART CATH AND CORONARY ANGIOGRAPHY; Location: [facility]; Surgeon: Percival Brace, MD   LITHOTRIPSY Right 1995   RIGHT AXILLARY LIPOMA REMOVAL  TUBAL LIGATION     VARICOSE VEIN SURGERY     Patient Active Problem List   Diagnosis Date Noted   Primary osteoarthritis of hip 02/09/2017   Primary osteoarthritis of knee 02/09/2017   Chronic sinus bradycardia 01/22/2017   Degenerative disc disease, lumbar 01/22/2017   Dizziness 01/22/2017   Nephrolithiasis 01/22/2017   Osteoporosis, 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/22/2017   PVC's (premature ventricular contractions) 01/22/2017   S/P total knee arthroplasty 01/22/2017   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  Pt accompanied by: self  PERTINENT HISTORY: Pt is an 83 y.o. 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 Pt with PMHx GERD, HTN, CAD, and osteoathritis. Per chart review, pt previously participated in LSVT in 2023. Noted, MBS on 04/12/20 with findings of mild oral dysphagia. Regular diet and thin liquids was recommended at that time.   DIAGNOSTIC FINDINGS: MRI brain, 10/26/22, "No acute finding by MRI. Moderate chronic small-vessel ischemic changes of the cerebral hemispheric white matter, which is considerably progressive when compared to the study of 2019."  PAIN:  Are you having pain? No  FALLS: Has patient fallen in last 6 months?  See PT evaluation for details  LIVING ENVIRONMENT: Lives with: lives alone; daughter lives next door   PLOF:  Level of assistance: Independent with ADLs Employment: Retired  PATIENT GOALS    for family to hear me  OBJECTIVE:  TODAY'S TREATMENT:  Pt seen for intensity based Parkinson's treatment. Reviewed HEP. Voice ex's completed as follows: Sustained /a/: x10, 86dB Ascending pitch glides: x10, 86dB Descending pitch glides: x10, 85db   Pt read x10 functional phrases twice through with loud and intentional voice, averaging 85 dB.    During 15 minute speech sample, pt averaged 78dB.   Pt benefited from use of biofeeback using Voice Analyst app.    Pt educated re:  changes to speech/voice due to Parkinson's Disease, voice ex's, SLP POC, and HEP.   PATIENT EDUCATION: Education details: as above Person educated: Patient Education method: Explanation Education comprehension: verbalized understanding and needs further education   HOME EXERCISE PROGRAM: As above (vocal ex's, reading)   GOALS: Goals reviewed with patient? Yes  SHORT TERM GOALS: Target date: 10 sessions  Pt will participate in further cognitive-linguistic assessment with additional goals, as appropriate. Baseline: Goal status: INITIAL  2.  The patient will complete Daily Tasks (Maximum duration "ah", High/Lows, and Functional Phrases) at average loudness >/= 80 dB and with loud, good quality voice with min cues.  Baseline:  Goal status: INITIAL  3.  The patient will complete Hierarchal Speech Loudness reading drills (words/phrases, sentences) at average >/= 75 dB and with loud, good quality voice with min cues.  Baseline:  Goal status: INITIAL  4.  The patient will participate in 5-8 minutes conversation, maintaining average loudness of 75 dB and good quality voice with modified independence.  Baseline:  Goal status: INITIAL    LONG TERM GOALS: Target date: 12 weeks  The patient will complete Daily  Tasks (Maximum duration "ah", High/Lows, and Functional Phrases) at average loudness >/= 80 dB and with loud, good quality voice.  Baseline:  Goal status: INITIAL  2.  The patient will complete Hierarchal Speech Loudness reading drills (words/phrases, sentences, and paragraph) at average >/= 75 dB and with loud, good quality voice.  Baseline:  Goal status: INITIAL  3.  The patient will participate in 20 minutes conversation, maintaining average loudness of >/= 75 dB and loud, good quality voice.  Baseline:  Goal status: INITIAL  4.  The patient will report improved communication effectiveness as measured by the Communication Effectiveness Survey.  Baseline: 05/12/23:  12/32 Goal status: INITIAL    ASSESSMENT:  CLINICAL IMPRESSION: Pt is an 83 y.o. 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 Pt with PMHx GERD, HTN, CAD, and osteoathritis. Per chart review, pt previously participated in LSVT in 2023. Recent assessment completed via informal means and PROM. 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 Patient reports requests to repeat herself from friends and family, and has noticed a decline in her vocal quality over several years. Patient did exhibit some strain during phonation tasks with stimulability testing, but was stimulable for improved loudness, pitch variation and vocal quality in reading and conversation using model and cues for "Loud, like your talking to a crowd." See details of tx session above. Recommend course of intensity based ST to maximize pt's vocal quality and intelligibility for conversations with family and friends to reduce social isolation and improve quality of life.  OBJECTIVE IMPAIRMENTS include dysarthria. These impairments are limiting patient from effectively communicating at home and in community. Factors affecting potential to achieve goals and functional outcome are co-morbidities. Patient will benefit from skilled SLP services to address above impairments and improve overall function.  REHAB POTENTIAL: Good  PLAN: SLP FREQUENCY: 2x/week  SLP DURATION: 12 weeks  PLANNED INTERVENTIONS: Cueing hierachy, Internal/external aids, Functional tasks, SLP instruction and feedback, Compensatory strategies, and Patient/family education   Dia Forget, M.S., CCC-SLP Speech-Language Pathologist [place] [facility] [phone] [place])   [facility] [facility] at [facility] [address] [place], [place], [address] Phone: [phone]   Fax:  [phone]

## 2023-06-30 NOTE — Therapy (Signed)
 OUTPATIENT PHYSICAL THERAPY THORACOLUMBAR TREATMENT/      Patient Name: Katelyn Crawford MRN: [id number] DOB:Apr 12, 1940, 83 y.o., female Today's Date: 06/30/2023   END OF SESSION:   PT End of Session - 06/30/23 1304     Visit Number 34    Number of Visits 38    Date for PT Re-Evaluation 07/07/23    Progress Note Due on Visit 40    PT Start Time 1100    PT Stop Time 1143    PT Time Calculation (min) 43 min    Equipment Utilized During Treatment Gait belt    Activity Tolerance Patient tolerated treatment well    Behavior During Therapy WFL for tasks assessed/performed                   Past Medical History:  Diagnosis Date   Anemia    Aortic atherosclerosis (HCC)    Arthritis    Cardiomegaly    Cardiomyopathy (HCC)    Cholelithiasis    Chronic kidney disease, stage 2 (mild)    Chronic sinus bradycardia    Coronary artery disease 03/10/2012   a.) LHC 03/10/2012: 40% mLAD, 50% pRI, 50% pRCA, 90% mRCA, 50% dRCA, 50% RPDA - unable to cannulate RCA - med mgmt; b.) LHC/PCI 06/25/2013: 30% pLAD, 40% mLAD, 20% mLCx, 30% RI, 30% pRCA, 70% mRCA (2.5 mm Promus DES), 40% dRCA-1, 90% dRCA-2 (2.5 mm Promus DES)   DDD (degenerative disc disease), thoracolumbar    Diverticulosis    Dizziness    Environmental allergies    Full dentures    GERD (gastroesophageal reflux disease) 06/25/2013   H/O bilateral cataract extraction 2019   History of diverticulosis    History of kidney stones    HLD (hyperlipidemia)    Hyperlipidemia, unspecified    Hypertension    Nephrolithiasis    Osteoporosis, post-menopausal    Parkinson disease (HCC)    Pre-diabetes    PVC's (premature ventricular contractions)    Scoliosis    Thoracic kyphosis    Trifascicular bundle branch block    a.) 1st degree AVB + RBBB + LAFB   Vascular disease    Past Surgical History:  Procedure Laterality Date   APPENDECTOMY     BREAST EXCISIONAL BIOPSY Left 1984   neg   BREAST EXCISIONAL BIOPSY Right 1997    lipoma   CATARACT EXTRACTION W/PHACO Left 09/02/2017   Procedure: CATARACT EXTRACTION PHACO AND INTRAOCULAR LENS PLACEMENT (IOC)  LEFT;  Surgeon: Annell Kidney, MD;  Location: [facility];  Service: Ophthalmology;  Laterality: Left;   CATARACT EXTRACTION W/PHACO Right 10/01/2017   Procedure: CATARACT EXTRACTION PHACO AND INTRAOCULAR LENS PLACEMENT (IOC) RIGHT;  Surgeon: Annell Kidney, MD;  Location: [facility];  Service: Ophthalmology;  Laterality: Right;   COLONOSCOPY WITH PROPOFOL  N/A 11/27/2016   Procedure: COLONOSCOPY WITH PROPOFOL ;  Surgeon: Toledo, Alphonsus Jeans, MD;  Location: [facility];  Service: Gastroenterology;  Laterality: N/A;   CORONARY ANGIOPLASTY WITH STENT PLACEMENT Left 06/25/2013   Procedure: CORONARY ANGIOPLASTY WITH STENT PLACEMENT; Location: [facility]; Surgeon: Arletta Lager, MD   KNEE ARTHROPLASTY Left 01/22/2017   Procedure: COMPUTER ASSISTED TOTAL KNEE ARTHROPLASTY;  Surgeon: Arlyne Lame, MD;  Location: [facility];  Service: Orthopedics;  Laterality: Left;   KNEE ARTHROSCOPY Left 02/01/2015   Procedure: ARTHROSCOPY KNEE, partial medial & lateral menisectomy,,medial and patelofemoral chondroplasty;  Surgeon: Arlyne Lame, MD;  Location: [facility];  Service: Orthopedics;  Laterality: Left;   LEFT HEART CATH AND CORONARY ANGIOGRAPHY Left  03/10/2012   Procedure: LEFT HEART CATH AND CORONARY ANGIOGRAPHY; Location: [facility]; Surgeon: Percival Brace, MD   LITHOTRIPSY Right 1995   RIGHT AXILLARY LIPOMA REMOVAL     TUBAL LIGATION     VARICOSE VEIN SURGERY     Patient Active Problem List   Diagnosis Date Noted   Primary osteoarthritis of hip 02/09/2017   Primary osteoarthritis of knee 02/09/2017   Chronic sinus bradycardia 01/22/2017   Degenerative disc disease, lumbar 01/22/2017   Dizziness 01/22/2017   Nephrolithiasis 01/22/2017   Osteoporosis, 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/22/2017   PVC's (premature ventricular contractions) 01/22/2017   S/P total knee  arthroplasty 01/22/2017   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   CKD (chronic kidney disease), 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   GERD (gastroesophageal reflux disease) 06/25/2013   Coronary artery disease involving native coronary artery of native heart 06/24/2013   Essential hypertension 06/21/2013   Mixed hyperlipidemia 06/21/2013    PCP: Yehuda Helms, MD   REFERRING PROVIDER: Meeler, Driscilla George, FNP  REFERRING DIAG:  Diagnosis  [phone] (ICD-10-CM) - Lumbar stenosis with neurogenic claudication  M54.14 (ICD-10-CM) - Thoracic radiculitis    Rationale for Evaluation and Treatment: Rehabilitation  THERAPY DIAG:  Muscle weakness (generalized)  Unsteadiness on feet  Other abnormalities of gait and mobility  Difficulty in walking, not elsewhere classified  Chronic bilateral thoracic back pain  Other lack of coordination  Parkinson's disease, unspecified whether dyskinesia present, unspecified whether manifestations fluctuate (HCC)  ONSET DATE: >3 months   SUBJECTIVE:                                                                                                                                                                                           SUBJECTIVE STATEMENT:   Patient reports her plan is possibly go to wellness zone when discharged from PT.   PERTINENT HISTORY:  Hx of PD and familiar to this clinic for PD management.  From recent MD appointment:  "The patient is a pleasant 83 year old female who presents today for acute on chronic bilateral low back pain without radiation to the lower extremities. She describes pain that began June 2020 without injury. She also states that she has had some form of back pain for many years. She describes her pain as mild to moderate that is sharp and intermittent. Bending and lifting increases her pain. Rest can help alleviate her pain. She participated in physical therapy at [facility] clinic in early 2020 with little relief.  She continues to stay active in her home and notices that household activities increase her pain such as vacuuming and mopping. Medications include Tylenol  (mild to moderate relief).  She was  last evaluated on 10/09/2022 at which time she was doing well. She was to continue with heat and home cycling along with physical therapy exercises. She was to continue with tramadol  nightly as needed.  At today's visit she reports that she is experience of increasing pain in the morning in her mid back about the bra line. She would like to return to physical therapy and she states that this was very helpful in managing her pain. We discussed if her pain was to continue consideration would be made for another epidural. She states that she is taking tramadol  at night and doing well with this. Denies any injury. Upon palpation today she is without tenderness to the thoracic paraspinal musculature. "  PAIN:  Are you having pain? Yes: NPRS scale: 0/10 Pain location: middle back Pain description: ache Aggravating factors: sleeping Relieving factors: tramadol    PRECAUTIONS: Fall   RED FLAGS: None   WEIGHT BEARING RESTRICTIONS: No  FALLS:  Has patient fallen in last 6 months? No  LIVING ENVIRONMENT: Lives with: lives alone and daughter lives next door. Lives in: House/apartment Stairs: Yes: External: 4 steps; on right going up and on left going up Has following equipment at home:  None, but has use cane in the past. Has shower rails, shower chair; doesn't use  OCCUPATION: retired   PLOF: Independent, Independent with basic ADLs, and Independent with gait  PATIENT GOALS: stronger. Improve mobility   NEXT MD VISIT: unsure   OBJECTIVE:  Note: Objective measures were completed at Evaluation unless otherwise noted.  DIAGNOSTIC FINDINGS:  No recent imaging   PATIENT SURVEYS:  Modified Oswestry 28%   COGNITION: Overall cognitive status: Within functional limits for tasks  assessed     SENSATION: WFL  MUSCLE LENGTH: WFL  POSTURE: rounded shoulders, forward head, and decreased lumbar lordosis  PALPATION: Noted tightness in upper thoracic paraspinal. No pain to palpation   LUMBAR ROM:   AROM eval  Flexion WFL  Extension 20  Right lateral flexion   Left lateral flexion   Right rotation minimal  Left rotation ~10 deg   Cervical  AROM eval  Flexion 45  Extension 18  Right lateral flexion 20  Left lateral flexion 12  Right rotation 50  Left rotation  40  Thoracic  AROM eval  Flexion WFL  Extension 10  Right lateral flexion 25  Left lateral flexion 20  Right rotation minimal  Left rotation ~10 deg      (Blank rows = not tested)  LOWER EXTREMITY ROM:     Grossly WFL  LOWER EXTREMITY MMT:    MMT Right eval Left eval  Hip flexion 4- 4-  Hip extension    Hip abduction 4- 4-  Hip adduction 4 4  Hip internal rotation    Hip external rotation    Knee flexion 4 4  Knee extension 4+ 4+  Ankle dorsiflexion 4 4  Ankle plantarflexion    Ankle inversion    Ankle eversion     (Blank rows = not tested)    FUNCTIONAL TESTS:  5 times sit to stand: 16  Timed up and go (TUG): 12.3 sce 6 minute walk test: 1163ft 10 meter walk test: 0.57m/s Functional gait assessment: 21  4/21: miniBest: 19/28   GAIT: Distance walked: 70 Assistive device utilized: None Level of assistance: Complete Independence Comments: flexed posture.   TREATMENT DATE: 06/30/2023  THEREX:  Nustep BLE/BUE LE strength training Progressive  resistance level 1-5 for total of 6 min.  Wellzone exercises to transition to gym based HEP: -back row- attempted yet patient arms not long enough to reach handles- switched to cable system- extensive work to negotiate equipment and patient with difficulty pulling pins to adjust equipment- will benefit from review- Level 3 2 x 10 reps each side. -Leg press - L3- 3 x 10  -Calf press - L3 3 x 10  (calves burning)  -Knee  ext- L2 2 x 10 BLE -Knee flex L3 2 x 10 BLE   PATIENT EDUCATION:  Education details:exercise technique, large-amplitude training/purpose, techniques for donning jacket, finger flicks Person educated: Patient Education method: IT trainer, VC Education comprehension: verbalized understanding, returned demo  HOME EXERCISE PROGRAM: Access Code: [email or web address] URL: [email or web address] Date: 02/20/2023 Prepared by: Aurora Lees  Exercises - Sit to Stand with Arm Swing  - 1 x daily - 7 x weekly - 2 sets - 10 reps - 2 hold - Seated Reach Forward, Up, and To Sides  - 1 x daily - 7 x weekly - 3 sets - 10 reps - Seated Reaching to Side and Across Body  - 1 x daily - 7 x weekly - 3 sets - 10 reps - Standing Row with Anchored Resistance  - 1 x daily - 7 x weekly - 3 sets - 10 reps  Access Code: [facility] URL: [email or web address] Date: 05/28/2023 Prepared by: Aurora Lees  Exercises - Step Sideways with Arms Reaching  - 1 x daily - 7 x weekly - 3 sets - 10 reps - 2 hold - Staggered Stance Weight Shift with Arms Reaching  - 1 x daily - 7 x weekly - 3 sets - 10 reps - 2 hold - Standing Reach to Opposite Side with Weight Shift  - 1 x daily - 7 x weekly - 3 sets - 10 reps - 2 hold  ASSESSMENT:  CLINICAL IMPRESSION:  Patient stated she might want to rejoin [facility] upon discharge. She was able to perform well over in wellzone - no pain reported yet some difficulty and repetitive cues to perform resistive activities today. She was challenged with leg press yet able to adjust seat and perform well - exhibiting only fatigue as limiting factor today.  Pt  will benefit from continued skilled PT to address back pain improve strength, increase endurance, and improve overall functional mobility.   OBJECTIVE IMPAIRMENTS: Abnormal gait, cardiopulmonary status limiting activity, decreased activity tolerance, decreased balance, decreased endurance, decreased mobility,  difficulty walking, decreased ROM, decreased strength, hypomobility, increased fascial restrictions, impaired perceived functional ability, impaired flexibility, impaired UE functional use, improper body mechanics, and postural dysfunction.   ACTIVITY LIMITATIONS: carrying, squatting, sleeping, and locomotion level  PARTICIPATION LIMITATIONS: cleaning, laundry, shopping, and community activity  PERSONAL FACTORS: Age, Fitness, and 1-2 comorbidities: lumbar stenosis  are also affecting patient's functional outcome.   REHAB POTENTIAL: Good  CLINICAL DECISION MAKING: Stable/uncomplicated  EVALUATION COMPLEXITY: Low   GOALS: Goals reviewed with patient? Yes   SHORT TERM GOALS: Target date: 06/09/2023    Patient will be independent in home exercise program to improve strength/mobility for better functional independence with ADLs. Baseline: to be given at session 2.  05/07/2023: need to be re-assessed Goal status: INITIAL   LONG TERM GOALS: Target date: 07/07/2023      Patient will improve Mod ODI score by 12.8 points  to demonstrate statistically significant improvement in mobility and quality of life.  Baseline: 28% 3/12: 26% 05/07/2023: 32% indicating moderate disability 5/19/: 24% Goal status: IN PROGRESS  2.  Patient (>  101 years old) will complete five times sit to stand test in < 15 seconds indicating an increased LE strength and improved balance. Baseline: 16 sec  3/12: 11.81. sec  05/07/2023: 11.09 seconds 5/19: 10.41 sec no UE support  Goal status: MET  Upgraded Goal on 05/07/2023: Patient will increase MiniBest Test score to >18/28 to indicate a reduced risk for falling and demonstrate increased independence with functional mobility and ADLs.  Baseline: 5/28: 15  Goal status: INITIAL  3.  Patient will increase 6 min walk test score by >150 to demonstrated improved access to community and improve safety.   Baseline: 1125ft 3/12: 1252ft 05/07/2023: 1238ft 5/19:  1141ft  Goal status: IN PROGRESS  4.  Patient will increase 10 meter walk test to >1.85m/s as to improve gait speed for better community ambulation and to reduce fall risk. Baseline: 0.35m/s  3/12: 1.13m/s 05/07/2023: 1.098 m/s no AD, independently  Goal status: MET  5.  Patient will reduce timed up and go to <11 seconds to reduce fall risk and demonstrate improved transfer/gait ability. Baseline: 13sec  3/12; 12.5sec  05/07/2023: 11.01 seconds no AD, independently Goal status: MET  Upgraded Goal on 05/07/2023: Patient will increase Functional Gait Assessment (FGA) score to >20/30 as to reduce fall risk and improve dynamic gait safety with community ambulation. Baseline: 5/19: 22 Goal Status: INITIAL   6.  Patient will reduced back pain to 3/10 at worst to indicate improved function with household tasks.  Baseline: 9/10 at night.  3/12: 8/10, when waking up in the morning 05/07/2023: continues to report back pain gets as high as 8/10 when bending forward 5/19:  Goal status: IN PROGRESS  PLAN:  PT FREQUENCY: 1-2x/week  PT DURATION: 12 weeks  PLANNED INTERVENTIONS: 97110-Therapeutic exercises, 97530- Therapeutic activity, 97112- Neuromuscular re-education, 97535- Self Care, 82956- Manual therapy, 97116- Gait training, 97014- Electrical stimulation (unattended), [phone]- Electrical stimulation (manual), Balance training, Stair training, Taping, Dry Needling, Joint mobilization, Joint manipulation, Spinal manipulation, Spinal mobilization, Scar mobilization, DME instructions, Cryotherapy, and Moist heat.  PLAN FOR NEXT SESSION:   Continued to manage Back pain and PD deficits. Dynamic gait and posture training.  Review activities in wellzone.    Ossie Blend, PT Physical Therapist - [facility]  1:05 PM 06/30/23

## 2023-07-02 ENCOUNTER — Ambulatory Visit: Payer: Medicare Other

## 2023-07-02 ENCOUNTER — Ambulatory Visit

## 2023-07-07 ENCOUNTER — Ambulatory Visit

## 2023-07-07 ENCOUNTER — Ambulatory Visit: Payer: Medicare Other

## 2023-07-07 DIAGNOSIS — M6281 Muscle weakness (generalized): Secondary | ICD-10-CM

## 2023-07-07 DIAGNOSIS — G20A1 Parkinson's disease without dyskinesia, without mention of fluctuations: Secondary | ICD-10-CM

## 2023-07-07 DIAGNOSIS — R278 Other lack of coordination: Secondary | ICD-10-CM

## 2023-07-07 DIAGNOSIS — R262 Difficulty in walking, not elsewhere classified: Secondary | ICD-10-CM

## 2023-07-07 DIAGNOSIS — G8929 Other chronic pain: Secondary | ICD-10-CM

## 2023-07-07 DIAGNOSIS — R471 Dysarthria and anarthria: Secondary | ICD-10-CM

## 2023-07-07 DIAGNOSIS — R2681 Unsteadiness on feet: Secondary | ICD-10-CM

## 2023-07-07 DIAGNOSIS — R2689 Other abnormalities of gait and mobility: Secondary | ICD-10-CM

## 2023-07-07 NOTE — Therapy (Signed)
 OUTPATIENT PHYSICAL THERAPY THORACOLUMBAR TREATMENT/ RECERT     Patient Name: TACIE MCCUISTION MRN: [id number] DOB:1940-08-26, 83 y.o., female Today's Date: 07/07/2023   END OF SESSION:   PT End of Session - 07/07/23 1107     Visit Number 35    Number of Visits 47    Date for PT Re-Evaluation 09/29/23    Progress Note Due on Visit 40    PT Start Time 1103    PT Stop Time 1149    PT Time Calculation (min) 46 min    Equipment Utilized During Treatment Gait belt    Activity Tolerance Patient tolerated treatment well    Behavior During Therapy WFL for tasks assessed/performed                Past Medical History:  Diagnosis Date   Anemia    Aortic atherosclerosis (HCC)    Arthritis    Cardiomegaly    Cardiomyopathy (HCC)    Cholelithiasis    Chronic kidney disease, stage 2 (mild)    Chronic sinus bradycardia    Coronary artery disease 03/10/2012   a.) LHC 03/10/2012: 40% mLAD, 50% pRI, 50% pRCA, 90% mRCA, 50% dRCA, 50% RPDA - unable to cannulate RCA - med mgmt; b.) LHC/PCI 06/25/2013: 30% pLAD, 40% mLAD, 20% mLCx, 30% RI, 30% pRCA, 70% mRCA (2.5 mm Promus DES), 40% dRCA-1, 90% dRCA-2 (2.5 mm Promus DES)   DDD (degenerative disc disease), thoracolumbar    Diverticulosis    Dizziness    Environmental allergies    Full dentures    GERD (gastroesophageal reflux disease) 06/25/2013   H/O bilateral cataract extraction 2019   History of diverticulosis    History of kidney stones    HLD (hyperlipidemia)    Hyperlipidemia, unspecified    Hypertension    Nephrolithiasis    Osteoporosis, post-menopausal    Parkinson disease (HCC)    Pre-diabetes    PVC's (premature ventricular contractions)    Scoliosis    Thoracic kyphosis    Trifascicular bundle branch block    a.) 1st degree AVB + RBBB + LAFB   Vascular disease    Past Surgical History:  Procedure Laterality Date   APPENDECTOMY     BREAST EXCISIONAL BIOPSY Left 1984   neg   BREAST EXCISIONAL BIOPSY Right 1997    lipoma   CATARACT EXTRACTION W/PHACO Left 09/02/2017   Procedure: CATARACT EXTRACTION PHACO AND INTRAOCULAR LENS PLACEMENT (IOC)  LEFT;  Surgeon: Annell Kidney, MD;  Location: [facility];  Service: Ophthalmology;  Laterality: Left;   CATARACT EXTRACTION W/PHACO Right 10/01/2017   Procedure: CATARACT EXTRACTION PHACO AND INTRAOCULAR LENS PLACEMENT (IOC) RIGHT;  Surgeon: Annell Kidney, MD;  Location: [facility];  Service: Ophthalmology;  Laterality: Right;   COLONOSCOPY WITH PROPOFOL  N/A 11/27/2016   Procedure: COLONOSCOPY WITH PROPOFOL ;  Surgeon: Toledo, Alphonsus Jeans, MD;  Location: [facility];  Service: Gastroenterology;  Laterality: N/A;   CORONARY ANGIOPLASTY WITH STENT PLACEMENT Left 06/25/2013   Procedure: CORONARY ANGIOPLASTY WITH STENT PLACEMENT; Location: [facility]; Surgeon: Arletta Lager, MD   KNEE ARTHROPLASTY Left 01/22/2017   Procedure: COMPUTER ASSISTED TOTAL KNEE ARTHROPLASTY;  Surgeon: Arlyne Lame, MD;  Location: [facility];  Service: Orthopedics;  Laterality: Left;   KNEE ARTHROSCOPY Left 02/01/2015   Procedure: ARTHROSCOPY KNEE, partial medial & lateral menisectomy,,medial and patelofemoral chondroplasty;  Surgeon: Arlyne Lame, MD;  Location: [facility];  Service: Orthopedics;  Laterality: Left;   LEFT HEART CATH AND CORONARY ANGIOGRAPHY Left 03/10/2012  Procedure: LEFT HEART CATH AND CORONARY ANGIOGRAPHY; Location: [facility]; Surgeon: Percival Brace, MD   LITHOTRIPSY Right 1995   RIGHT AXILLARY LIPOMA REMOVAL     TUBAL LIGATION     VARICOSE VEIN SURGERY     Patient Active Problem List   Diagnosis Date Noted   Primary osteoarthritis of hip 02/09/2017   Primary osteoarthritis of knee 02/09/2017   Chronic sinus bradycardia 01/22/2017   Degenerative disc disease, lumbar 01/22/2017   Dizziness 01/22/2017   Nephrolithiasis 01/22/2017   Osteoporosis, 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/22/2017   PVC's (premature ventricular contractions) 01/22/2017   S/P total knee  arthroplasty 01/22/2017   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   CKD (chronic kidney disease), 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   GERD (gastroesophageal reflux disease) 06/25/2013   Coronary artery disease involving native coronary artery of native heart 06/24/2013   Essential hypertension 06/21/2013   Mixed hyperlipidemia 06/21/2013    PCP: Yehuda Helms, MD   REFERRING PROVIDER: Meeler, Driscilla George, FNP  REFERRING DIAG:  Diagnosis  [id number] (ICD-10-CM) - Lumbar stenosis with neurogenic claudication  M54.14 (ICD-10-CM) - Thoracic radiculitis    Rationale for Evaluation and Treatment: Rehabilitation  THERAPY DIAG:  Muscle weakness (generalized)  Unsteadiness on feet  Other abnormalities of gait and mobility  Difficulty in walking, not elsewhere classified  Chronic bilateral thoracic back pain  Other lack of coordination  Parkinson's disease, unspecified whether dyskinesia present, unspecified whether manifestations fluctuate (HCC)  ONSET DATE: >3 months   SUBJECTIVE:                                                                                                                                                                                           SUBJECTIVE STATEMENT:   Patient reports she would like to continue with PT- feels like she is making improvement in her walking and balance and would like to concentrate some more on her mid back pain/posture. States no pain right at this moment but report ongoing mid back pain- up to 10/10.    PERTINENT HISTORY:  Hx of PD and familiar to this clinic for PD management.  From recent MD appointment:  The patient is a pleasant 83 year old female who presents today for acute on chronic bilateral low back pain without radiation to the lower extremities. She describes pain that began June 2020 without injury. She also states that she has had some form of back pain for many years. She describes her pain as mild to moderate that is  sharp and intermittent. Bending and lifting increases her pain. Rest can help alleviate her pain. She participated in physical therapy at [facility] clinic in early 2020 with little  relief. She continues to stay active in her home and notices that household activities increase her pain such as vacuuming and mopping. Medications include Tylenol  (mild to moderate relief).  She was last evaluated on 10/09/2022 at which time she was doing well. She was to continue with heat and home cycling along with physical therapy exercises. She was to continue with tramadol  nightly as needed.  At today's visit she reports that she is experience of increasing pain in the morning in her mid back about the bra line. She would like to return to physical therapy and she states that this was very helpful in managing her pain. We discussed if her pain was to continue consideration would be made for another epidural. She states that she is taking tramadol  at night and doing well with this. Denies any injury. Upon palpation today she is without tenderness to the thoracic paraspinal musculature.   PAIN:  Are you having pain? Yes: NPRS scale: 0/10 Pain location: middle back Pain description: ache Aggravating factors: sleeping Relieving factors: tramadol    PRECAUTIONS: Fall   RED FLAGS: None   WEIGHT BEARING RESTRICTIONS: No  FALLS:  Has patient fallen in last 6 months? No  LIVING ENVIRONMENT: Lives with: lives alone and daughter lives next door. Lives in: House/apartment Stairs: Yes: External: 4 steps; on right going up and on left going up Has following equipment at home:  None, but has use cane in the past. Has shower rails, shower chair; doesn't use  OCCUPATION: retired   PLOF: Independent, Independent with basic ADLs, and Independent with gait  PATIENT GOALS: stronger. Improve mobility   NEXT MD VISIT: unsure   OBJECTIVE:  Note: Objective measures were completed at Evaluation unless otherwise  noted.  DIAGNOSTIC FINDINGS:  No recent imaging   PATIENT SURVEYS:  Modified Oswestry 28%   COGNITION: Overall cognitive status: Within functional limits for tasks assessed     SENSATION: WFL  MUSCLE LENGTH: WFL  POSTURE: rounded shoulders, forward head, and decreased lumbar lordosis  PALPATION: Noted tightness in upper thoracic paraspinal. No pain to palpation   LUMBAR ROM:   AROM eval  Flexion WFL  Extension 20  Right lateral flexion   Left lateral flexion   Right rotation minimal  Left rotation ~10 deg   Cervical  AROM eval  Flexion 45  Extension 18  Right lateral flexion 20  Left lateral flexion 12  Right rotation 50  Left rotation  40  Thoracic  AROM eval  Flexion WFL  Extension 10  Right lateral flexion 25  Left lateral flexion 20  Right rotation minimal  Left rotation ~10 deg      (Blank rows = not tested)  LOWER EXTREMITY ROM:     Grossly WFL  LOWER EXTREMITY MMT:    MMT Right eval Left eval  Hip flexion 4- 4-  Hip extension    Hip abduction 4- 4-  Hip adduction 4 4  Hip internal rotation    Hip external rotation    Knee flexion 4 4  Knee extension 4+ 4+  Ankle dorsiflexion 4 4  Ankle plantarflexion    Ankle inversion    Ankle eversion     (Blank rows = not tested)    FUNCTIONAL TESTS:  5 times sit to stand: 16  Timed up and go (TUG): 12.3 sce 6 minute walk test: 1176ft 10 meter walk test: 0.64m/s Functional gait assessment: 21  4/21: miniBest: 19/28   GAIT: Distance walked: 70 Assistive device utilized: None Level  of assistance: Complete Independence Comments: flexed posture.   TREATMENT DATE: 07/07/2023  Physical therapy treatment session today consisted of completing assessment of goals and administration of testing as demonstrated and documented in flow sheet, treatment, and goals section of this note. Addition treatments may be found below.     [facility] PT Assessment - 07/07/23 1120       Mini-BESTest   Sit To  Stand Normal: Comes to stand without use of hands and stabilizes independently.    Rise to Toes Moderate: Heels up, but not full range (smaller than when holding hands), OR noticeable instability for 3 s.    Stand on one leg (left) Moderate: < 20 s    Stand on one leg (right) Moderate: < 20 s    Stand on one leg - lowest score 1    Compensatory Stepping Correction - Forward Moderate: More than one step is required to recover equilibrium    Compensatory Stepping Correction - Backward Moderate: More than one step is required to recover equilibrium    Compensatory Stepping Correction - Left Lateral Moderate: Several steps to recover equilibrium    Compensatory Stepping Correction - Right Lateral Moderate: Several steps to recover equilibrium    Stepping Corredtion Lateral - lowest score 1    Stance - Feet together, eyes open, firm surface  Normal: 30s    Stance - Feet together, eyes closed, foam surface  Normal: 30s    Incline - Eyes Closed Moderate: Stands independently < 30s OR aligns with surface    Change in Gait Speed Normal: Significantly changes walkling speed without imbalance    Walk with head turns - Horizontal Moderate: performs head turns with reduction in gait speed.    Walk with pivot turns Moderate:Turns with feet close SLOW (>4 steps) with good balance.    Step over obstacles Moderate: Steps over box but touches box OR displays cautious behavior by slowing gait.    Timed UP & GO with Dual Task Moderate: Dual Task affects either counting OR walking (>10%) when compared to the TUG without Dual Task.    Mini-BEST total score 18      Functional Gait  Assessment   Gait Level Surface Walks 20 ft in less than 5.5 sec, no assistive devices, good speed, no evidence for imbalance, normal gait pattern, deviates no more than 6 in outside of the 12 in walkway width.    Change in Gait Speed Able to change speed, demonstrates mild gait deviations, deviates 6-10 in outside of the 12 in walkway  width, or no gait deviations, unable to achieve a major change in velocity, or uses a change in velocity, or uses an assistive device.    Gait with Horizontal Head Turns Performs head turns smoothly with slight change in gait velocity (eg, minor disruption to smooth gait path), deviates 6-10 in outside 12 in walkway width, or uses an assistive device.    Gait with Vertical Head Turns Performs head turns with no change in gait. Deviates no more than 6 in outside 12 in walkway width.    Gait and Pivot Turn Pivot turns safely within 3 sec and stops quickly with no loss of balance.    Step Over Obstacle Is able to step over one shoe box (4.5 in total height) but must slow down and adjust steps to clear box safely. May require verbal cueing.    Gait with Narrow Base of Support Ambulates 4-7 steps.    Gait with Eyes Closed Walks 20  ft, no assistive devices, good speed, no evidence of imbalance, normal gait pattern, deviates no more than 6 in outside 12 in walkway width. Ambulates 20 ft in less than 7 sec.    Ambulating Backwards Walks 20 ft, uses assistive device, slower speed, mild gait deviations, deviates 6-10 in outside 12 in walkway width.    Steps Alternating feet, must use rail.    Total Score 22          Mod Oswestry= 28%  PATIENT EDUCATION:  Education details:exercise technique, large-amplitude training/purpose, techniques for donning jacket, finger flicks Person educated: Patient Education method: IT trainer, VC Education comprehension: verbalized understanding, returned demo  HOME EXERCISE PROGRAM: Access Code: [email or web address] URL: [email or web address] Date: 02/20/2023 Prepared by: Aurora Lees  Exercises - Sit to Stand with Arm Swing  - 1 x daily - 7 x weekly - 2 sets - 10 reps - 2 hold - Seated Reach Forward, Up, and To Sides  - 1 x daily - 7 x weekly - 3 sets - 10 reps - Seated Reaching to Side and Across Body  - 1 x daily - 7 x weekly - 3 sets - 10 reps -  Standing Row with Anchored Resistance  - 1 x daily - 7 x weekly - 3 sets - 10 reps  Access Code: [facility] URL: [email or web address] Date: 05/28/2023 Prepared by: Aurora Lees  Exercises - Step Sideways with Arms Reaching  - 1 x daily - 7 x weekly - 3 sets - 10 reps - 2 hold - Staggered Stance Weight Shift with Arms Reaching  - 1 x daily - 7 x weekly - 3 sets - 10 reps - 2 hold - Standing Reach to Opposite Side with Weight Shift  - 1 x daily - 7 x weekly - 3 sets - 10 reps - 2 hold  ASSESSMENT:  CLINICAL IMPRESSION:  Patient presents with good motivation for assessment visit today. She was retested with balance with mixed results- no change in FGA score from last test but did improve Minibest balance test. She has also been inconsistent with Modified Oswestry results but has reported some increased mid to low back lately- intermittent but very painful at times- up to 10/10. She still has potential to improve her posture which may in turn assist with with overall back pain. Discussed recerting at lower frequency to place more emphasis on patient working at home on posture/balance and down to 1x/week to reinforce/modify activities. Patient's condition has the potential to improve in response to therapy. Maximum improvement is yet to be obtained. The anticipated improvement is attainable and reasonable in a generally predictable time. Pt  will benefit from continued skilled PT to address back pain improve strength, increase endurance, and improve overall functional mobility.   OBJECTIVE IMPAIRMENTS: Abnormal gait, cardiopulmonary status limiting activity, decreased activity tolerance, decreased balance, decreased endurance, decreased mobility, difficulty walking, decreased ROM, decreased strength, hypomobility, increased fascial restrictions, impaired perceived functional ability, impaired flexibility, impaired UE functional use, improper body mechanics, and postural dysfunction.    ACTIVITY LIMITATIONS: carrying, squatting, sleeping, and locomotion level  PARTICIPATION LIMITATIONS: cleaning, laundry, shopping, and community activity  PERSONAL FACTORS: Age, Fitness, and 1-2 comorbidities: lumbar stenosis  are also affecting patient's functional outcome.   REHAB POTENTIAL: Good  CLINICAL DECISION MAKING: Stable/uncomplicated  EVALUATION COMPLEXITY: Low   GOALS: Goals reviewed with patient? Yes   SHORT TERM GOALS: Target date: 06/09/2023    Patient will be independent in home exercise program to improve strength/mobility for better functional independence  with ADLs. Baseline: to be given at session 2.  05/07/2023: need to be re-assessed Goal status: INITIAL   LONG TERM GOALS: Target date: 09/29/2023      Patient will improve Mod ODI score by 12.8 points  to demonstrate statistically significant improvement in mobility and quality of life.  Baseline: 28% 3/12: 26% 05/07/2023: 32% indicating moderate disability 5/19/: 24% 07/07/2023= 28% Goal status: IN PROGRESS  2.  Patient (> 54 years old) will complete five times sit to stand test in < 15 seconds indicating an increased LE strength and improved balance. Baseline: 16 sec  3/12: 11.81. sec  05/07/2023: 11.09 seconds 5/19: 10.41 sec no UE support  Goal status: MET   3.  Patient will increase 6 min walk test score by >150 to demonstrated improved access to community and improve safety.   Baseline: 111ft 3/12: 1268ft 05/07/2023: 1297ft 5/19: 1145ft  Goal status: IN PROGRESS  4.  Patient will increase 10 meter walk test to >1.7m/s as to improve gait speed for better community ambulation and to reduce fall risk. Baseline: 0.24m/s  3/12: 1.68m/s 05/07/2023: 1.098 m/s no AD, independently  Goal status: MET  5.  Patient will reduce timed up and go to <11 seconds to reduce fall risk and demonstrate improved transfer/gait ability. Baseline: 13sec  3/12; 12.5sec  05/07/2023: 11.01 seconds no AD,  independently Goal status: MET   6.  Patient will reduced back pain to 3/10 at worst to indicate improved function with household tasks.  Baseline: 9/10 at night.  3/12: 8/10, when waking up in the morning 05/07/2023: continues to report back pain gets as high as 8/10 when bending forward 6/16= no pain presently- still up to 10/10 mid to low back intermittent.  Goal status: IN PROGRESS  7. Patient will increase MiniBest Test score to >18/28 to indicate a reduced risk for falling and demonstrate increased independence with functional mobility and ADLs.  Baseline: 5/28: 15; 07/07/2023= 18  Goal status: PROGRESSING  8.  Patient will increase Functional Gait Assessment (FGA) score to >24/30 as to reduce fall risk and improve dynamic gait safety with community ambulation. Baseline: 5/19: 22; 07/07/2023=22 Goal Status: REVISED PLAN:  PT FREQUENCY: 1x/week  PT DURATION: 12 weeks  PLANNED INTERVENTIONS: 97110-Therapeutic exercises, 97530- Therapeutic activity, 97112- Neuromuscular re-education, 97535- Self Care, [id number]- Manual therapy, 97116- Gait training, 97014- Electrical stimulation (unattended), [phone]- Electrical stimulation (manual), Balance training, Stair training, Taping, Dry Needling, Joint mobilization, Joint manipulation, Spinal manipulation, Spinal mobilization, Scar mobilization, DME instructions, Cryotherapy, and Moist heat.  PLAN FOR NEXT SESSION:   Continued to manage Back pain and PD deficits. Dynamic gait and posture training.    Ossie Blend, PT Physical Therapist - [facility]  12:29 PM 07/07/23

## 2023-07-07 NOTE — Therapy (Signed)
 OUTPATIENT SPEECH LANGUAGE PATHOLOGY PARKINSON'S TREATMENT / PROGRESS NOTE   Patient Name: Katelyn Crawford MRN: [id number] DOB:November 23, 1940, 83 y.o., female Today's Date: 07/07/2023  PCP: Shary Deems, MD REFERRING PROVIDER: Devora Folks, MD  Speech Therapy Progress Note  Dates of Reporting Period: 05/12/23 to 07/07/23  Objective: Patient has been seen for 10 speech therapy sessions this reporting period targeting dysarthria. Patient is making progress toward LTGs and met 3/4 STGs this reporting period. See skilled intervention, clinical impressions, and goals below for details.    End of Session - 07/07/23 1117     Visit Number 10    Number of Visits 24    Date for SLP Re-Evaluation 08/04/23    SLP Start Time 1015    SLP Stop Time  1100    SLP Time Calculation (min) 45 min    Activity Tolerance Patient tolerated treatment well   despite c/o general malaise         Past Medical History:  Diagnosis Date   Anemia    Aortic atherosclerosis (HCC)    Arthritis    Cardiomegaly    Cardiomyopathy (HCC)    Cholelithiasis    Chronic kidney disease, stage 2 (mild)    Chronic sinus bradycardia    Coronary artery disease 03/10/2012   a.) LHC 03/10/2012: 40% mLAD, 50% pRI, 50% pRCA, 90% mRCA, 50% dRCA, 50% RPDA - unable to cannulate RCA - med mgmt; b.) LHC/PCI 06/25/2013: 30% pLAD, 40% mLAD, 20% mLCx, 30% RI, 30% pRCA, 70% mRCA (2.5 mm Promus DES), 40% dRCA-1, 90% dRCA-2 (2.5 mm Promus DES)   DDD (degenerative disc disease), thoracolumbar    Diverticulosis    Dizziness    Environmental allergies    Full dentures    GERD (gastroesophageal reflux disease) 06/25/2013   H/O bilateral cataract extraction 2019   History of diverticulosis    History of kidney stones    HLD (hyperlipidemia)    Hyperlipidemia, unspecified    Hypertension    Nephrolithiasis    Osteoporosis, post-menopausal    Parkinson disease (HCC)    Pre-diabetes    PVC's (premature ventricular contractions)     Scoliosis    Thoracic kyphosis    Trifascicular bundle branch block    a.) 1st degree AVB + RBBB + LAFB   Vascular disease    Past Surgical History:  Procedure Laterality Date   APPENDECTOMY     BREAST EXCISIONAL BIOPSY Left 1984   neg   BREAST EXCISIONAL BIOPSY Right 1997   lipoma   CATARACT EXTRACTION W/PHACO Left 09/02/2017   Procedure: CATARACT EXTRACTION PHACO AND INTRAOCULAR LENS PLACEMENT (IOC)  LEFT;  Surgeon: Annell Kidney, MD;  Location: [facility];  Service: Ophthalmology;  Laterality: Left;   CATARACT EXTRACTION W/PHACO Right 10/01/2017   Procedure: CATARACT EXTRACTION PHACO AND INTRAOCULAR LENS PLACEMENT (IOC) RIGHT;  Surgeon: Annell Kidney, MD;  Location: [facility];  Service: Ophthalmology;  Laterality: Right;   COLONOSCOPY WITH PROPOFOL  N/A 11/27/2016   Procedure: COLONOSCOPY WITH PROPOFOL ;  Surgeon: Toledo, Alphonsus Jeans, MD;  Location: [facility];  Service: Gastroenterology;  Laterality: N/A;   CORONARY ANGIOPLASTY WITH STENT PLACEMENT Left 06/25/2013   Procedure: CORONARY ANGIOPLASTY WITH STENT PLACEMENT; Location: [facility]; Surgeon: Arletta Lager, MD   KNEE ARTHROPLASTY Left 01/22/2017   Procedure: COMPUTER ASSISTED TOTAL KNEE ARTHROPLASTY;  Surgeon: Katelyn Lame, MD;  Location: [facility];  Service: Orthopedics;  Laterality: Left;   KNEE ARTHROSCOPY Left 02/01/2015   Procedure: ARTHROSCOPY KNEE, partial medial & lateral menisectomy,,medial and  patelofemoral chondroplasty;  Surgeon: Katelyn Lame, MD;  Location: [facility];  Service: Orthopedics;  Laterality: Left;   LEFT HEART CATH AND CORONARY ANGIOGRAPHY Left 03/10/2012   Procedure: LEFT HEART CATH AND CORONARY ANGIOGRAPHY; Location: [facility]; Surgeon: Percival Brace, MD   LITHOTRIPSY Right 1995   RIGHT AXILLARY LIPOMA REMOVAL     TUBAL LIGATION     VARICOSE VEIN SURGERY     Patient Active Problem List   Diagnosis Date Noted   Primary osteoarthritis of hip 02/09/2017   Primary  osteoarthritis of knee 02/09/2017   Chronic sinus bradycardia 01/22/2017   Degenerative disc disease, lumbar 01/22/2017   Dizziness 01/22/2017   Nephrolithiasis 01/22/2017   Osteoporosis, 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/22/2017   PVC's (premature ventricular contractions) 01/22/2017   S/P total knee arthroplasty 01/22/2017   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   CKD (chronic kidney disease), 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   GERD (gastroesophageal reflux disease) 06/25/2013   Coronary artery disease involving native coronary artery of native heart 06/24/2013   Essential hypertension 06/21/2013   Mixed hyperlipidemia 06/21/2013    ONSET DATE: 05/08/23 referral date, dx with PD in 2019  REFERRING DIAG: Parkinson's Disease  THERAPY DIAG:  Dysarthria and anarthria  Rationale for Evaluation and Treatment Rehabilitation  SUBJECTIVE:   SUBJECTIVE STATEMENT: Pt alert, pleasant, and cooperative.  Pt accompanied by: self  PERTINENT HISTORY: Pt is an 83 y.o. 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 Pt with PMHx GERD, HTN, CAD, and osteoathritis. Per chart review, pt previously participated in LSVT in 2023. Noted, MBS on 04/12/20 with findings of mild oral dysphagia. Regular diet and thin liquids was recommended at that time.   DIAGNOSTIC FINDINGS: MRI brain, 10/26/22, No acute finding by MRI. Moderate chronic small-vessel ischemic changes of the cerebral hemispheric white matter, which is considerably progressive when compared to the study of 2019.  PAIN:  Are you having pain? No  FALLS: Has patient fallen in last 6 months?  See PT evaluation for details  LIVING ENVIRONMENT: Lives with: lives alone; daughter lives next door   PLOF:  Level of assistance: Independent with ADLs Employment: Retired  PATIENT GOALS    for family to hear me  OBJECTIVE:  TODAY'S TREATMENT:  Pt seen for intensity based Parkinson's  treatment. Reviewed HEP. Voice ex's completed as follows: Sustained /a/: x10, 85dB Ascending pitch glides: x10, 86dB Descending pitch glides: x10, 85db   Pt read x10 functional phrases twice through with loud and intentional voice, averaging 85 dB.    During 15 minute speech sample with background noise, pt averaged 78dB.   Pt benefited from use of biofeeback using Voice Analyst app.    Pt educated re: changes to speech/voice due to Parkinson's Disease, voice ex's, SLP POC, and HEP.   PATIENT EDUCATION: Education details: as above Person educated: Patient Education method: Explanation Education comprehension: verbalized understanding and needs further education   HOME EXERCISE PROGRAM: As above (vocal ex's, reading)   GOALS: Goals reviewed with patient? Yes  SHORT TERM GOALS: Target date: 10 sessions  Pt will participate in further cognitive-linguistic assessment with additional goals, as appropriate. Baseline: Goal status: PROGRESSING  2.  The patient will complete Daily Tasks (Maximum duration ah, High/Lows, and Functional Phrases) at average loudness >/= 80 dB and with loud, good quality voice with min cues.  Baseline:  Goal status: MET  3.  The patient will complete Hierarchal Speech Loudness reading drills (words/phrases, sentences) at average >/= 75 dB  and with loud, good quality voice with min cues.  Baseline:  Goal status: MET  4.  The patient will participate in 5-8 minutes conversation, maintaining average loudness of 75 dB and good quality voice with modified independence.  Baseline:  Goal status: MET    LONG TERM GOALS: Target date: 12 weeks  The patient will complete Daily Tasks (Maximum duration ah, High/Lows, and Functional Phrases) at average loudness >/= 80 dB and with loud, good quality voice.  Baseline:  Goal status: MET  2.  The patient will complete Hierarchal Speech Loudness reading drills (words/phrases, sentences, and paragraph) at  average >/= 75 dB and with loud, good quality voice.  Baseline:  Goal status: MET  3.  The patient will participate in 20 minutes conversation, maintaining average loudness of >/= 75 dB and loud, good quality voice.  Baseline:  Goal status: PROGRESSING  4.  The patient will report improved communication effectiveness as measured by the Communication Effectiveness Survey.  Baseline: 05/12/23: 12/32 Goal status: PROGRESSING    ASSESSMENT:  CLINICAL IMPRESSION: Pt is an 83 y.o. 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 Pt with PMHx GERD, HTN, CAD, and osteoathritis. Per chart review, pt previously participated in LSVT in 2023. Recent assessment completed via informal means and PROM. 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 Patient reports requests to repeat herself from friends and family, and has noticed a decline in her vocal quality over several years. Patient did exhibit some strain during phonation tasks with stimulability testing, but was stimulable for improved loudness, pitch variation and vocal quality in reading and conversation using model and cues for Loud, like your talking to a crowd. See details of tx session above. Recommend course of intensity based ST to maximize pt's vocal quality and intelligibility for conversations with family and friends to reduce social isolation and improve quality of life.  OBJECTIVE IMPAIRMENTS include dysarthria. These impairments are limiting patient from effectively communicating at home and in community. Factors affecting potential to achieve goals and functional outcome are co-morbidities. Patient will benefit from skilled SLP services to address above impairments and improve overall function.  REHAB POTENTIAL: Good  PLAN: SLP FREQUENCY: 2x/week  SLP  DURATION: 12 weeks  PLANNED INTERVENTIONS: Cueing hierachy, Internal/external aids, Functional tasks, SLP instruction and feedback, Compensatory strategies, and Patient/family education   Dia Forget, M.S., CCC-SLP Speech-Language Pathologist [place] [facility] [phone] [place])   [facility] Out[facility] Rehabilitation at [facility] [address] [place], [place], [address] Phone: [phone]   Fax:  [phone]

## 2023-07-09 ENCOUNTER — Ambulatory Visit

## 2023-07-09 ENCOUNTER — Ambulatory Visit: Payer: Medicare Other

## 2023-07-09 DIAGNOSIS — R471 Dysarthria and anarthria: Secondary | ICD-10-CM

## 2023-07-09 DIAGNOSIS — M6281 Muscle weakness (generalized): Secondary | ICD-10-CM | POA: Diagnosis not present

## 2023-07-09 DIAGNOSIS — R41841 Cognitive communication deficit: Secondary | ICD-10-CM

## 2023-07-09 NOTE — Therapy (Signed)
 OUTPATIENT SPEECH LANGUAGE PATHOLOGY PARKINSON'S TREATMENT / PROGRESS NOTE   Patient Name: Katelyn Crawford MRN: [id number] DOB:[birth year], 83 y.o., female Today's Date: 07/09/2023  PCP: Shary Deems, MD REFERRING PROVIDER: Devora Folks, MD    End of Session - 07/09/23 1245     Visit Number 11    Number of Visits 24    Date for SLP Re-Evaluation 08/04/23    SLP Start Time 1100    SLP Stop Time  1145    SLP Time Calculation (min) 45 min    Activity Tolerance Patient tolerated treatment well          Past Medical History:  Diagnosis Date   Anemia    Aortic atherosclerosis (HCC)    Arthritis    Cardiomegaly    Cardiomyopathy (HCC)    Cholelithiasis    Chronic kidney disease, stage 2 (mild)    Chronic sinus bradycardia    Coronary artery disease 03/10/2012   a.) LHC 03/10/2012: 40% mLAD, 50% pRI, 50% pRCA, 90% mRCA, 50% dRCA, 50% RPDA - unable to cannulate RCA - med mgmt; b.) LHC/PCI 06/25/2013: 30% pLAD, 40% mLAD, 20% mLCx, 30% RI, 30% pRCA, 70% mRCA (2.5 mm Promus DES), 40% dRCA-1, 90% dRCA-2 (2.5 mm Promus DES)   DDD (degenerative disc disease), thoracolumbar    Diverticulosis    Dizziness    Environmental allergies    Full dentures    GERD (gastroesophageal reflux disease) 06/25/2013   H/O bilateral cataract extraction 2019   History of diverticulosis    History of kidney stones    HLD (hyperlipidemia)    Hyperlipidemia, unspecified    Hypertension    Nephrolithiasis    Osteoporosis, post-menopausal    Parkinson disease (HCC)    Pre-diabetes    PVC's (premature ventricular contractions)    Scoliosis    Thoracic kyphosis    Trifascicular bundle branch block    a.) 1st degree AVB + RBBB + LAFB   Vascular disease    Past Surgical History:  Procedure Laterality Date   APPENDECTOMY     BREAST EXCISIONAL BIOPSY Left 1984   neg   BREAST EXCISIONAL BIOPSY Right 1997   lipoma   CATARACT EXTRACTION W/PHACO Left 09/02/2017   Procedure: CATARACT EXTRACTION PHACO  AND INTRAOCULAR LENS PLACEMENT (IOC)  LEFT;  Surgeon: Annell Kidney, MD;  Location: [facility];  Service: Ophthalmology;  Laterality: Left;   CATARACT EXTRACTION W/PHACO Right 10/01/2017   Procedure: CATARACT EXTRACTION PHACO AND INTRAOCULAR LENS PLACEMENT (IOC) RIGHT;  Surgeon: Annell Kidney, MD;  Location: [facility];  Service: Ophthalmology;  Laterality: Right;   COLONOSCOPY WITH PROPOFOL  N/A 11/27/2016   Procedure: COLONOSCOPY WITH PROPOFOL ;  Surgeon: Toledo, Alphonsus Jeans, MD;  Location: [facility];  Service: Gastroenterology;  Laterality: N/A;   CORONARY ANGIOPLASTY WITH STENT PLACEMENT Left 06/25/2013   Procedure: CORONARY ANGIOPLASTY WITH STENT PLACEMENT; Location: [facility]; Surgeon: Arletta Lager, MD   KNEE ARTHROPLASTY Left 01/22/2017   Procedure: COMPUTER ASSISTED TOTAL KNEE ARTHROPLASTY;  Surgeon: Arlyne Lame, MD;  Location: [facility];  Service: Orthopedics;  Laterality: Left;   KNEE ARTHROSCOPY Left 02/01/2015   Procedure: ARTHROSCOPY KNEE, partial medial & lateral menisectomy,,medial and patelofemoral chondroplasty;  Surgeon: Arlyne Lame, MD;  Location: [facility];  Service: Orthopedics;  Laterality: Left;   LEFT HEART CATH AND CORONARY ANGIOGRAPHY Left 03/10/2012   Procedure: LEFT HEART CATH AND CORONARY ANGIOGRAPHY; Location: [facility]; Surgeon: Percival Brace, MD   LITHOTRIPSY Right 1995   RIGHT AXILLARY LIPOMA REMOVAL  TUBAL LIGATION     VARICOSE VEIN SURGERY     Patient Active Problem List   Diagnosis Date Noted   Primary osteoarthritis of hip 02/09/2017   Primary osteoarthritis of knee 02/09/2017   Chronic sinus bradycardia 01/22/2017   Degenerative disc disease, lumbar 01/22/2017   Dizziness 01/22/2017   Nephrolithiasis 01/22/2017   Osteoporosis, 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/22/2017   PVC's (premature ventricular contractions) 01/22/2017   S/P total knee arthroplasty 01/22/2017   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  Pt accompanied by: self  PERTINENT HISTORY: Pt is an 83 y.o. 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 Pt with PMHx GERD, HTN, CAD, and osteoathritis. Per chart review, pt previously participated in LSVT in 2023. Noted, MBS on 04/12/20 with findings of mild oral dysphagia. Regular diet and thin liquids was recommended at that time.   DIAGNOSTIC FINDINGS: MRI brain, 10/26/22, No acute finding by MRI. Moderate chronic small-vessel ischemic changes of the cerebral hemispheric white matter, which is considerably progressive when compared to the study of 2019.  PAIN:  Are you having pain? No  FALLS: Has patient fallen in last 6 months?  See PT evaluation for details  LIVING ENVIRONMENT: Lives with: lives alone; daughter lives next door   PLOF:  Level of assistance: Independent with ADLs Employment: Retired  PATIENT GOALS    for family to hear me  OBJECTIVE:  TODAY'S TREATMENT:  Reviewed HEP which pt endorsed completing independently this AM. Pt participated in > 20 minutes conversation, maintaining average loudness of > 75 dB and loud, good quality voice. Pt educated on maintenance HEP for dysarthria.   Given pt's concerns re: wordfinding and potential wordfinding deficits in people with Parkinson's Disease. Pt given Mini ACE, scoring 24/30 with difficulty  with wordfinding/verbal fluency noted. Pt would like to pursue treatment for wordfinding difficulty. New goal set. Will address in upcoming sessions.   PATIENT EDUCATION: Education details: as above Person educated: Patient Education method: Explanation Education comprehension: verbalized understanding and needs further education   HOME EXERCISE PROGRAM: As above (vocal ex's, reading)   GOALS: Goals reviewed with patient? Yes  SHORT TERM GOALS: Target date: 10 sessions  Pt will participate in further cognitive-linguistic assessment with additional goals, as appropriate. Baseline: Goal status: MET  2.  The patient will complete Daily Tasks (Maximum duration ah, High/Lows, and Functional Phrases) at average loudness >/= 80 dB and with loud, good quality voice with min cues.  Baseline:  Goal status: MET  3.  The patient will complete Hierarchal Speech Loudness reading drills (words/phrases, sentences) at average >/= 75 dB and with loud, good quality voice with min cues.  Baseline:  Goal status: MET  4.  The patient will participate in 5-8 minutes conversation, maintaining average loudness of 75 dB and good quality voice with modified independence.  Baseline:  Goal status: MET    LONG TERM GOALS: Target date: 12 weeks  The patient will complete Daily Tasks (Maximum duration  ah, High/Lows, and Functional Phrases) at average loudness >/= 80 dB and with loud, good quality voice.  Baseline:  Goal status: MET  2.  The patient will complete Hierarchal Speech Loudness reading drills (words/phrases, sentences, and paragraph) at average >/= 75 dB and with loud, good quality voice.  Baseline:  Goal status: MET  3.  The patient will participate in 20 minutes conversation, maintaining average loudness of >/= 75 dB and loud, good quality voice.  Baseline:  Goal status: MET  4.  The patient will report improved communication effectiveness as measured by the Communication  Effectiveness Survey.  Baseline: 05/12/23: 12/32 Goal status: PROGRESSING  **New goal- set 07/09/2023 5. With Min A, patient will complete a semantic feature analysis with at least 3 relevant features for 4/5 target words to improve word-finding skills. 4  Baseline:  Goal status: PROGRESSING      ASSESSMENT:  CLINICAL IMPRESSION: Pt is an 83 y.o. 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 Pt with PMHx GERD, HTN, CAD, and osteoathritis. Per chart review, pt previously participated in LSVT in 2023. Pt has met intensity based ST goals, but endorsed difficulty with wordfing. Mini-ACE given today which noted difficulty with wordfinding/fluency.  See details of tx session above. Recommend course of cognitive-communication therapy targeting wordfinding ability to improve social communication.  OBJECTIVE IMPAIRMENTS include wordfinding difficulty. These impairments are limiting patient from effectively communicating at home and in community. Factors affecting potential to achieve goals and functional outcome are co-morbidities. Patient will benefit from skilled SLP services to address above impairments and improve overall function.  REHAB POTENTIAL: Good  PLAN: SLP FREQUENCY: 2x/week  SLP DURATION: 12 weeks  PLANNED INTERVENTIONS: Cueing hierachy, Internal/external aids, Functional tasks, SLP instruction and feedback, Compensatory strategies, and Patient/family education   Dia Forget, M.S., CCC-SLP Speech-Language Pathologist [place] [facility] [phone] [place])    [facility] Outpatient Rehabilitation at [facility] [address] [place], [place], [address] Phone: [phone]   Fax:  [phone]

## 2023-07-14 ENCOUNTER — Ambulatory Visit

## 2023-07-14 ENCOUNTER — Ambulatory Visit: Payer: Medicare Other

## 2023-07-14 DIAGNOSIS — M6281 Muscle weakness (generalized): Secondary | ICD-10-CM | POA: Diagnosis not present

## 2023-07-14 DIAGNOSIS — R41841 Cognitive communication deficit: Secondary | ICD-10-CM

## 2023-07-14 NOTE — Therapy (Signed)
 OUTPATIENT SPEECH LANGUAGE PATHOLOGY PARKINSON'S TREATMENT   Patient Name: Katelyn Crawford MRN: [id number] DOB:December 12, 1940, 83 y.o., female Today's Date: 07/14/2023  PCP: Reyes Costa, MD REFERRING PROVIDER: Jannett Fairly, MD    End of Session - 07/14/23 1100     Visit Number 12    Number of Visits 24    Date for SLP Re-Evaluation 08/04/23    SLP Start Time 1100    SLP Stop Time  1145    SLP Time Calculation (min) 45 min    Activity Tolerance Patient tolerated treatment well          Past Medical History:  Diagnosis Date   Anemia    Aortic atherosclerosis (HCC)    Arthritis    Cardiomegaly    Cardiomyopathy (HCC)    Cholelithiasis    Chronic kidney disease, stage 2 (mild)    Chronic sinus bradycardia    Coronary artery disease 03/10/2012   a.) LHC 03/10/2012: 40% mLAD, 50% pRI, 50% pRCA, 90% mRCA, 50% dRCA, 50% RPDA - unable to cannulate RCA - med mgmt; b.) LHC/PCI 06/25/2013: 30% pLAD, 40% mLAD, 20% mLCx, 30% RI, 30% pRCA, 70% mRCA (2.5 mm Promus DES), 40% dRCA-1, 90% dRCA-2 (2.5 mm Promus DES)   DDD (degenerative disc disease), thoracolumbar    Diverticulosis    Dizziness    Environmental allergies    Full dentures    GERD (gastroesophageal reflux disease) 06/25/2013   H/O bilateral cataract extraction 2019   History of diverticulosis    History of kidney stones    HLD (hyperlipidemia)    Hyperlipidemia, unspecified    Hypertension    Nephrolithiasis    Osteoporosis, post-menopausal    Parkinson disease (HCC)    Pre-diabetes    PVC's (premature ventricular contractions)    Scoliosis    Thoracic kyphosis    Trifascicular bundle branch block    a.) 1st degree AVB + RBBB + LAFB   Vascular disease    Past Surgical History:  Procedure Laterality Date   APPENDECTOMY     BREAST EXCISIONAL BIOPSY Left 1984   neg   BREAST EXCISIONAL BIOPSY Right 1997   lipoma   CATARACT EXTRACTION W/PHACO Left 09/02/2017   Procedure: CATARACT EXTRACTION PHACO AND INTRAOCULAR  LENS PLACEMENT (IOC)  LEFT;  Surgeon: Mittie Gaskin, MD;  Location: [facility];  Service: Ophthalmology;  Laterality: Left;   CATARACT EXTRACTION W/PHACO Right 10/01/2017   Procedure: CATARACT EXTRACTION PHACO AND INTRAOCULAR LENS PLACEMENT (IOC) RIGHT;  Surgeon: Mittie Gaskin, MD;  Location: [facility];  Service: Ophthalmology;  Laterality: Right;   COLONOSCOPY WITH PROPOFOL  N/A 11/27/2016   Procedure: COLONOSCOPY WITH PROPOFOL ;  Surgeon: Toledo, Ladell MARLA, MD;  Location: [facility];  Service: Gastroenterology;  Laterality: N/A;   CORONARY ANGIOPLASTY WITH STENT PLACEMENT Left 06/25/2013   Procedure: CORONARY ANGIOPLASTY WITH STENT PLACEMENT; Location: [facility]; Surgeon: Dayton Dash, MD   KNEE ARTHROPLASTY Left 01/22/2017   Procedure: COMPUTER ASSISTED TOTAL KNEE ARTHROPLASTY;  Surgeon: Mardee Lynwood SQUIBB, MD;  Location: [facility];  Service: Orthopedics;  Laterality: Left;   KNEE ARTHROSCOPY Left 02/01/2015   Procedure: ARTHROSCOPY KNEE, partial medial & lateral menisectomy,,medial and patelofemoral chondroplasty;  Surgeon: Lynwood SQUIBB Mardee, MD;  Location: [facility];  Service: Orthopedics;  Laterality: Left;   LEFT HEART CATH AND CORONARY ANGIOGRAPHY Left 03/10/2012   Procedure: LEFT HEART CATH AND CORONARY ANGIOGRAPHY; Location: [facility]; Surgeon: Marsa Dooms, MD   LITHOTRIPSY Right 1995   RIGHT AXILLARY LIPOMA REMOVAL     TUBAL LIGATION  VARICOSE VEIN SURGERY     Patient Active Problem List   Diagnosis Date Noted   Primary osteoarthritis of hip 02/09/2017   Primary osteoarthritis of knee 02/09/2017   Chronic sinus bradycardia 01/22/2017   Degenerative disc disease, lumbar 01/22/2017   Dizziness 01/22/2017   Nephrolithiasis 01/22/2017   Osteoporosis, 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/22/2017   PVC's (premature ventricular contractions) 01/22/2017   S/P total knee arthroplasty 01/22/2017   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  Pt accompanied by: self  PERTINENT HISTORY: Pt is an 83 y.o. 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 Pt with PMHx GERD, HTN, CAD, and osteoathritis. Per chart review, pt previously participated in LSVT in 2023. Noted, MBS on 04/12/20 with findings of mild oral dysphagia. Regular diet and thin liquids was recommended at that time.   DIAGNOSTIC FINDINGS: MRI brain, 10/26/22, No acute finding by MRI. Moderate chronic small-vessel ischemic changes of the cerebral hemispheric white matter, which is considerably progressive when compared to the study of 2019.  PAIN:  Are you having pain? No  FALLS: Has patient fallen in last 6 months?  See PT evaluation for details  LIVING ENVIRONMENT: Lives with: lives alone; daughter lives next door   PLOF:  Level of assistance: Independent with ADLs Employment: Retired  PATIENT GOALS    for family to hear me  OBJECTIVE:  TODAY'S TREATMENT:  Introduced Engineer, agricultural for improved repair of communication breakdowns due to wordfinding deficits. Pt required mod/max A to utilize for x5 common objects. Will continue to practice in upcoming session. Conversationally, pt with x1 instance of anomia appreciated with pt utilizing extra time to repair/recall.    PATIENT EDUCATION: Education details: as above Person educated: Patient Education method:  Explanation Education comprehension: verbalized understanding and needs further education   HOME EXERCISE PROGRAM: Practice SFA; continue voice ex's previously recommendation   GOALS: Goals reviewed with patient? Yes  SHORT TERM GOALS: Target date: 10 sessions  Pt will participate in further cognitive-linguistic assessment with additional goals, as appropriate. Baseline: Goal status: MET  2.  The patient will complete Daily Tasks (Maximum duration ah, High/Lows, and Functional Phrases) at average loudness >/= 80 dB and with loud, good quality voice with min cues.  Baseline:  Goal status: MET  3.  The patient will complete Hierarchal Speech Loudness reading drills (words/phrases, sentences) at average >/= 75 dB and with loud, good quality voice with min cues.  Baseline:  Goal status: MET  4.  The patient will participate in 5-8 minutes conversation, maintaining average loudness of 75 dB and good quality voice with modified independence.  Baseline:  Goal status: MET    LONG TERM GOALS: Target date: 12 weeks  The patient will complete Daily Tasks (Maximum duration ah, High/Lows, and Functional Phrases) at average loudness >/= 80 dB and with loud, good quality voice.  Baseline:  Goal status: MET  2.  The patient will complete Hierarchal Speech Loudness reading drills (words/phrases, sentences, and paragraph) at average >/=  75 dB and with loud, good quality voice.  Baseline:  Goal status: MET  3.  The patient will participate in 20 minutes conversation, maintaining average loudness of >/= 75 dB and loud, good quality voice.  Baseline:  Goal status: MET  4.  The patient will report improved communication effectiveness as measured by the Communication Effectiveness Survey.  Baseline: 05/12/23: 12/32 Goal status: PROGRESSING  **New goal- set 07/09/2023 5. With Min A, patient will complete a semantic feature analysis with at least 3 relevant features for 4/5 target words to  improve word-finding skills. 4  Baseline:  Goal status: PROGRESSING      ASSESSMENT:  CLINICAL IMPRESSION: Pt is an 83 y.o. 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 Pt with PMHx GERD, HTN, CAD, and osteoathritis. Per chart review, pt previously participated in LSVT in 2023. Pt has met intensity based ST goals, but endorsed difficulty with wordfing. Mini-ACE given today which noted difficulty with wordfinding/fluency.  See details of tx session above. Recommend course of cognitive-communication therapy targeting wordfinding ability to improve social communication.  OBJECTIVE IMPAIRMENTS include wordfinding difficulty. These impairments are limiting patient from effectively communicating at home and in community. Factors affecting potential to achieve goals and functional outcome are co-morbidities. Patient will benefit from skilled SLP services to address above impairments and improve overall function.  REHAB POTENTIAL: Good  PLAN: SLP FREQUENCY: 2x/week  SLP DURATION: 12 weeks  PLANNED INTERVENTIONS: Cueing hierachy, Internal/external aids, Functional tasks, SLP instruction and feedback, Compensatory strategies, and Patient/family education   Delon Bangs, M.S., CCC-SLP Speech-Language Pathologist [place] [facility] [phone] [place])   [facility] [address] [place], [place], [address] Phone: [phone]   Fax:  [phone]

## 2023-07-16 ENCOUNTER — Ambulatory Visit

## 2023-07-16 ENCOUNTER — Ambulatory Visit: Payer: Medicare Other | Admitting: Physical Therapy

## 2023-07-16 DIAGNOSIS — G8929 Other chronic pain: Secondary | ICD-10-CM

## 2023-07-16 DIAGNOSIS — R41841 Cognitive communication deficit: Secondary | ICD-10-CM

## 2023-07-16 DIAGNOSIS — R2681 Unsteadiness on feet: Secondary | ICD-10-CM

## 2023-07-16 DIAGNOSIS — R278 Other lack of coordination: Secondary | ICD-10-CM

## 2023-07-16 DIAGNOSIS — M6281 Muscle weakness (generalized): Secondary | ICD-10-CM | POA: Diagnosis not present

## 2023-07-16 DIAGNOSIS — R262 Difficulty in walking, not elsewhere classified: Secondary | ICD-10-CM

## 2023-07-16 DIAGNOSIS — R2689 Other abnormalities of gait and mobility: Secondary | ICD-10-CM

## 2023-07-16 DIAGNOSIS — G20A1 Parkinson's disease without dyskinesia, without mention of fluctuations: Secondary | ICD-10-CM

## 2023-07-16 NOTE — Therapy (Signed)
 OUTPATIENT PHYSICAL THERAPY THORACOLUMBAR TREATMENT     Patient Name: Katelyn Crawford MRN: [id number] DOB:08-28-1940, 83 y.o., female Today's Date: 07/16/2023   END OF SESSION:   PT End of Session - 07/16/23 1148     Visit Number 36    Number of Visits 47    Date for PT Re-Evaluation 09/29/23    Progress Note Due on Visit 40    PT Start Time 1148    PT Stop Time 1228    PT Time Calculation (min) 40 min    Equipment Utilized During Treatment Gait belt    Activity Tolerance Patient tolerated treatment well    Behavior During Therapy WFL for tasks assessed/performed                Past Medical History:  Diagnosis Date   Anemia    Aortic atherosclerosis (HCC)    Arthritis    Cardiomegaly    Cardiomyopathy (HCC)    Cholelithiasis    Chronic kidney disease, stage 2 (mild)    Chronic sinus bradycardia    Coronary artery disease 03/10/2012   a.) LHC 03/10/2012: 40% mLAD, 50% pRI, 50% pRCA, 90% mRCA, 50% dRCA, 50% RPDA - unable to cannulate RCA - med mgmt; b.) LHC/PCI 06/25/2013: 30% pLAD, 40% mLAD, 20% mLCx, 30% RI, 30% pRCA, 70% mRCA (2.5 mm Promus DES), 40% dRCA-1, 90% dRCA-2 (2.5 mm Promus DES)   DDD (degenerative disc disease), thoracolumbar    Diverticulosis    Dizziness    Environmental allergies    Full dentures    GERD (gastroesophageal reflux disease) 06/25/2013   H/O bilateral cataract extraction 2019   History of diverticulosis    History of kidney stones    HLD (hyperlipidemia)    Hyperlipidemia, unspecified    Hypertension    Nephrolithiasis    Osteoporosis, post-menopausal    Parkinson disease (HCC)    Pre-diabetes    PVC's (premature ventricular contractions)    Scoliosis    Thoracic kyphosis    Trifascicular bundle branch block    a.) 1st degree AVB + RBBB + LAFB   Vascular disease    Past Surgical History:  Procedure Laterality Date   APPENDECTOMY     BREAST EXCISIONAL BIOPSY Left 1984   neg   BREAST EXCISIONAL BIOPSY Right 1997   lipoma    CATARACT EXTRACTION W/PHACO Left 09/02/2017   Procedure: CATARACT EXTRACTION PHACO AND INTRAOCULAR LENS PLACEMENT (IOC)  LEFT;  Surgeon: Mittie Gaskin, MD;  Location: [facility];  Service: Ophthalmology;  Laterality: Left;   CATARACT EXTRACTION W/PHACO Right 10/01/2017   Procedure: CATARACT EXTRACTION PHACO AND INTRAOCULAR LENS PLACEMENT (IOC) RIGHT;  Surgeon: Mittie Gaskin, MD;  Location: [facility];  Service: Ophthalmology;  Laterality: Right;   COLONOSCOPY WITH PROPOFOL  N/A 11/27/2016   Procedure: COLONOSCOPY WITH PROPOFOL ;  Surgeon: Toledo, Ladell MARLA, MD;  Location: [facility];  Service: Gastroenterology;  Laterality: N/A;   CORONARY ANGIOPLASTY WITH STENT PLACEMENT Left 06/25/2013   Procedure: CORONARY ANGIOPLASTY WITH STENT PLACEMENT; Location: [facility]; Surgeon: Dayton Dash, MD   KNEE ARTHROPLASTY Left 01/22/2017   Procedure: COMPUTER ASSISTED TOTAL KNEE ARTHROPLASTY;  Surgeon: Mardee Lynwood SQUIBB, MD;  Location: [facility];  Service: Orthopedics;  Laterality: Left;   KNEE ARTHROSCOPY Left 02/01/2015   Procedure: ARTHROSCOPY KNEE, partial medial & lateral menisectomy,,medial and patelofemoral chondroplasty;  Surgeon: Lynwood SQUIBB Mardee, MD;  Location: [facility];  Service: Orthopedics;  Laterality: Left;   LEFT HEART CATH AND CORONARY ANGIOGRAPHY Left 03/10/2012   Procedure:  LEFT HEART CATH AND CORONARY ANGIOGRAPHY; Location: [facility]; Surgeon: Marsa Dooms, MD   LITHOTRIPSY Right 1995   RIGHT AXILLARY LIPOMA REMOVAL     TUBAL LIGATION     VARICOSE VEIN SURGERY     Patient Active Problem List   Diagnosis Date Noted   Primary osteoarthritis of hip 02/09/2017   Primary osteoarthritis of knee 02/09/2017   Chronic sinus bradycardia 01/22/2017   Degenerative disc disease, lumbar 01/22/2017   Dizziness 01/22/2017   Nephrolithiasis 01/22/2017   Osteoporosis, 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/22/2017   PVC's (premature ventricular contractions) 01/22/2017   S/P total knee  arthroplasty 01/22/2017   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   CKD (chronic kidney disease), 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   GERD (gastroesophageal reflux disease) 06/25/2013   Coronary artery disease involving native coronary artery of native heart 06/24/2013   Essential hypertension 06/21/2013   Mixed hyperlipidemia 06/21/2013    PCP: Auston Reyes BIRCH, MD   REFERRING PROVIDER: Meeler, Benton CROME, FNP  REFERRING DIAG:  Diagnosis  [phone] (ICD-10-CM) - Lumbar stenosis with neurogenic claudication  M54.14 (ICD-10-CM) - Thoracic radiculitis    Rationale for Evaluation and Treatment: Rehabilitation  THERAPY DIAG:  Muscle weakness (generalized)  Unsteadiness on feet  Other abnormalities of gait and mobility  Difficulty in walking, not elsewhere classified  Chronic bilateral thoracic back pain  Other lack of coordination  Parkinson's disease, unspecified whether dyskinesia present, unspecified whether manifestations fluctuate (HCC)  ONSET DATE: >3 months   SUBJECTIVE:                                                                                                                                                                                           SUBJECTIVE STATEMENT:  Had a very busy Saturday, so she did not go to church on Sunday. Has had increased knee pain due to Bakers cyst flare up over the last few weeks, but it is not bothering her today,  Patient reports that she is doing well. Reports that she had some pain in the back this morning, but it resolved after moving around a little movement .     PERTINENT HISTORY:   Hx of PD and familiar to this clinic for PD management.  From recent MD appointment:  The patient is a pleasant 83 year old female who presents today for acute on chronic bilateral low back pain without radiation to the lower extremities. She describes pain that began June 2020 without injury. She also states that she has had some form of back pain  for many years. She describes her pain as mild to moderate that is sharp and intermittent. Bending and lifting increases her pain. Rest  can help alleviate her pain. She participated in physical therapy at [facility] in early 2020 with little relief. She continues to stay active in her home and notices that household activities increase her pain such as vacuuming and mopping. Medications include Tylenol  (mild to moderate relief).  She was last evaluated on 10/09/2022 at which time she was doing well. She was to continue with heat and home cycling along with physical therapy exercises. She was to continue with tramadol  nightly as needed.  At today's visit she reports that she is experience of increasing pain in the morning in her mid back about the bra line. She would like to return to physical therapy and she states that this was very helpful in managing her pain. We discussed if her pain was to continue consideration would be made for another epidural. She states that she is taking tramadol  at night and doing well with this. Denies any injury. Upon palpation today she is without tenderness to the thoracic paraspinal musculature.   PAIN:  Are you having pain? Yes: NPRS scale: 0/10 Pain location: middle back Pain description: ache Aggravating factors: sleeping Relieving factors: tramadol    PRECAUTIONS: Fall   RED FLAGS: None   WEIGHT BEARING RESTRICTIONS: No  FALLS:  Has patient fallen in last 6 months? No  LIVING ENVIRONMENT: Lives with: lives alone and daughter lives next door. Lives in: House/apartment Stairs: Yes: External: 4 steps; on right going up and on left going up Has following equipment at home:  None, but has use cane in the past. Has shower rails, shower chair; doesn't use  OCCUPATION: retired   PLOF: Independent, Independent with basic ADLs, and Independent with gait  PATIENT GOALS: stronger. Improve mobility   NEXT MD VISIT: unsure   OBJECTIVE:  Note:  Objective measures were completed at Evaluation unless otherwise noted.  DIAGNOSTIC FINDINGS:  No recent imaging   PATIENT SURVEYS:  Modified Oswestry 28%   COGNITION: Overall cognitive status: Within functional limits for tasks assessed     SENSATION: WFL  MUSCLE LENGTH: WFL  POSTURE: rounded shoulders, forward head, and decreased lumbar lordosis  PALPATION: Noted tightness in upper thoracic paraspinal. No pain to palpation   LUMBAR ROM:   AROM eval  Flexion WFL  Extension 20  Right lateral flexion   Left lateral flexion   Right rotation minimal  Left rotation ~10 deg   Cervical  AROM eval  Flexion 45  Extension 18  Right lateral flexion 20  Left lateral flexion 12  Right rotation 50  Left rotation  40  Thoracic  AROM eval  Flexion WFL  Extension 10  Right lateral flexion 25  Left lateral flexion 20  Right rotation minimal  Left rotation ~10 deg      (Blank rows = not tested)  LOWER EXTREMITY ROM:     Grossly WFL  LOWER EXTREMITY MMT:    MMT Right eval Left eval  Hip flexion 4- 4-  Hip extension    Hip abduction 4- 4-  Hip adduction 4 4  Hip internal rotation    Hip external rotation    Knee flexion 4 4  Knee extension 4+ 4+  Ankle dorsiflexion 4 4  Ankle plantarflexion    Ankle inversion    Ankle eversion     (Blank rows = not tested)    FUNCTIONAL TESTS:  5 times sit to stand: 16  Timed up and go (TUG): 12.3 sce 6 minute walk test: 1171ft 10 meter walk test: 0.70m/s Functional  gait assessment: 21  4/21: miniBest: 19/28   GAIT: Distance walked: 70 Assistive device utilized: None Level of assistance: Complete Independence Comments: flexed posture.   TREATMENT DATE: 07/16/2023  Gait without AD x 430ft.  Forward reach to sock>trunk extension with bolster along spine and shoulder extension 2x 10  Sit<>stand with overhead reach 2x 10  Seated lateral/cross body reach x 12 bil.  Lateral lunge with hip ER and UE abduction 2x 8  bil.   At rail Reciprocal march x 12 Tandem stance 2 x 20 sec bil.  Side stepping 64ft x 5 light UE support   Therapeutic rest break provided throughout as needed with hydration breaks. CGA and cues for sequencing of movement with UE and LE simultaneously as improved ROM thourgh upper thoracic region   PATIENT EDUCATION:  Education details:exercise technique, large-amplitude training/purpose, techniques for donning jacket, finger flicks Person educated: Patient Education method: IT trainer, VC Education comprehension: verbalized understanding, returned demo  HOME EXERCISE PROGRAM: Access Code: [facility] URL: [email or web address] Date: 07/16/2023 Prepared by: Massie Dollar  Exercises - Step Sideways with Arms Reaching  - 1 x daily - 7 x weekly - 3 sets - 10 reps - 2 hold - Standing Reach to Opposite Side with Weight Shift  - 1 x daily - 7 x weekly - 3 sets - 10 reps - 2 hold - Sit to Stand with Arm Swing  - 1 x daily - 7 x weekly - 3 sets - 10 reps - Seated Reach Forward, Up, and To Sides  - 1 x daily - 7 x weekly - 3 sets - 10 reps - Seated Reaching to Side and Across Body  - 1 x daily - 7 x weekly - 3 sets - 10 reps - Seated Shoulder Row with Anchored Resistance  - 1 x daily - 7 x weekly - 3 sets - 10 reps - Standing March with Counter Support  - 1 x daily - 7 x weekly - 3 sets - 10 reps - Side Stepping with Counter Support  - 1 x daily - 7 x weekly - 3 sets - 10 reps  ASSESSMENT:  CLINICAL IMPRESSION:  Patient presents with good motivation for assessment visit today. No pain in back at start of today. Pt reports that she only recalls one exercises given to her for HEP for PD or back pain management. PT treatment focused on education for HEP review. Unable to print new copy of HEP due to printing error, so hand out unable to be provided.  Pt  will benefit from continued skilled PT to address back pain improve strength, increase endurance, and improve overall  functional mobility.   OBJECTIVE IMPAIRMENTS: Abnormal gait, cardiopulmonary status limiting activity, decreased activity tolerance, decreased balance, decreased endurance, decreased mobility, difficulty walking, decreased ROM, decreased strength, hypomobility, increased fascial restrictions, impaired perceived functional ability, impaired flexibility, impaired UE functional use, improper body mechanics, and postural dysfunction.   ACTIVITY LIMITATIONS: carrying, squatting, sleeping, and locomotion level  PARTICIPATION LIMITATIONS: cleaning, laundry, shopping, and community activity  PERSONAL FACTORS: Age, Fitness, and 1-2 comorbidities: lumbar stenosis  are also affecting patient's functional outcome.   REHAB POTENTIAL: Good  CLINICAL DECISION MAKING: Stable/uncomplicated  EVALUATION COMPLEXITY: Low   GOALS: Goals reviewed with patient? Yes   SHORT TERM GOALS: Target date: 06/09/2023    Patient will be independent in home exercise program to improve strength/mobility for better functional independence with ADLs. Baseline: to be given at session 2.  05/07/2023: need to be re-assessed Goal  status: INITIAL   LONG TERM GOALS: Target date: 09/29/2023      Patient will improve Mod ODI score by 12.8 points  to demonstrate statistically significant improvement in mobility and quality of life.  Baseline: 28% 3/12: 26% 05/07/2023: 32% indicating moderate disability 5/19/: 24% 07/07/2023= 28% Goal status: IN PROGRESS  2.  Patient (> 31 years old) will complete five times sit to stand test in < 15 seconds indicating an increased LE strength and improved balance. Baseline: 16 sec  3/12: 11.81. sec  05/07/2023: 11.09 seconds 5/19: 10.41 sec no UE support  Goal status: MET   3.  Patient will increase 6 min walk test score by >150 to demonstrated improved access to community and improve safety.   Baseline: 1121ft 3/12: 127ft 05/07/2023: 12100ft 5/19: 1170ft  Goal status: IN  PROGRESS  4.  Patient will increase 10 meter walk test to >1.21m/s as to improve gait speed for better community ambulation and to reduce fall risk. Baseline: 0.88m/s  3/12: 1.46m/s 05/07/2023: 1.098 m/s no AD, independently  Goal status: MET  5.  Patient will reduce timed up and go to <11 seconds to reduce fall risk and demonstrate improved transfer/gait ability. Baseline: 13sec  3/12; 12.5sec  05/07/2023: 11.01 seconds no AD, independently Goal status: MET   6.  Patient will reduced back pain to 3/10 at worst to indicate improved function with household tasks.  Baseline: 9/10 at night.  3/12: 8/10, when waking up in the morning 05/07/2023: continues to report back pain gets as high as 8/10 when bending forward 6/16= no pain presently- still up to 10/10 mid to low back intermittent.  Goal status: IN PROGRESS  7. Patient will increase MiniBest Test score to >18/28 to indicate a reduced risk for falling and demonstrate increased independence with functional mobility and ADLs.  Baseline: 5/28: 15; 07/07/2023= 18  Goal status: PROGRESSING  8.  Patient will increase Functional Gait Assessment (FGA) score to >24/30 as to reduce fall risk and improve dynamic gait safety with community ambulation. Baseline: 5/19: 22; 07/07/2023=22 Goal Status: REVISED PLAN:  PT FREQUENCY: 1x/week  PT DURATION: 12 weeks  PLANNED INTERVENTIONS: 97110-Therapeutic exercises, 97530- Therapeutic activity, 97112- Neuromuscular re-education, 97535- Self Care, [id number]- Manual therapy, 97116- Gait training, 97014- Electrical stimulation (unattended), [phone]- Electrical stimulation (manual), Balance training, Stair training, Taping, Dry Needling, Joint mobilization, Joint manipulation, Spinal manipulation, Spinal mobilization, Scar mobilization, DME instructions, Cryotherapy, and Moist heat.  PLAN FOR NEXT SESSION:   Continued to manage Back pain and PD deficits. Dynamic gait and posture training.  Print and review HEP  as appropriate  Encourage pt to perform HEP.   Massie Dollar PT, DPT  Physical Therapist - [facility]  11:49 AM 07/16/23

## 2023-07-16 NOTE — Therapy (Signed)
 OUTPATIENT SPEECH LANGUAGE PATHOLOGY [facility]   Patient Name: Katelyn Crawford MRN: [id number] DOB:1940-10-12, 83 y.o., female Today's Date: 07/16/2023  PCP: Reyes Costa, MD REFERRING PROVIDER: Jannett Fairly, MD    End of Session - 07/16/23 1226     Visit Number 13    Number of Visits 24    Date for SLP Re-Evaluation 08/04/23    SLP Start Time 1100    SLP Stop Time  1145    SLP Time Calculation (min) 45 min    Activity Tolerance Patient tolerated treatment well          Past Medical History:  Diagnosis Date   Anemia    Aortic atherosclerosis (HCC)    Arthritis    Cardiomegaly    Cardiomyopathy (HCC)    Cholelithiasis    Chronic kidney disease, stage 2 (mild)    Chronic sinus bradycardia    Coronary artery disease 03/10/2012   a.) LHC 03/10/2012: 40% mLAD, 50% pRI, 50% pRCA, 90% mRCA, 50% dRCA, 50% RPDA - unable to cannulate RCA - med mgmt; b.) LHC/PCI 06/25/2013: 30% pLAD, 40% mLAD, 20% mLCx, 30% RI, 30% pRCA, 70% mRCA (2.5 mm Promus DES), 40% dRCA-1, 90% dRCA-2 (2.5 mm Promus DES)   DDD (degenerative disc disease), thoracolumbar    Diverticulosis    Dizziness    Environmental allergies    Full dentures    GERD (gastroesophageal reflux disease) 06/25/2013   H/O bilateral cataract extraction 2019   History of diverticulosis    History of kidney stones    HLD (hyperlipidemia)    Hyperlipidemia, unspecified    Hypertension    Nephrolithiasis    Osteoporosis, post-menopausal    Parkinson disease (HCC)    Pre-diabetes    PVC's (premature ventricular contractions)    Scoliosis    Thoracic kyphosis    Trifascicular bundle branch block    a.) 1st degree AVB + RBBB + LAFB   Vascular disease    Past Surgical History:  Procedure Laterality Date   APPENDECTOMY     BREAST EXCISIONAL BIOPSY Left 1984   neg   BREAST EXCISIONAL BIOPSY Right 1997   lipoma   CATARACT EXTRACTION W/PHACO Left 09/02/2017   Procedure: CATARACT EXTRACTION PHACO AND INTRAOCULAR  LENS PLACEMENT (IOC)  LEFT;  Surgeon: Mittie Gaskin, MD;  Location: [facility];  Service: Ophthalmology;  Laterality: Left;   CATARACT EXTRACTION W/PHACO Right 10/01/2017   Procedure: CATARACT EXTRACTION PHACO AND INTRAOCULAR LENS PLACEMENT (IOC) RIGHT;  Surgeon: Mittie Gaskin, MD;  Location: [facility];  Service: Ophthalmology;  Laterality: Right;   COLONOSCOPY WITH PROPOFOL  N/A 11/27/2016   Procedure: COLONOSCOPY WITH PROPOFOL ;  Surgeon: Toledo, Ladell MARLA, MD;  Location: [facility];  Service: Gastroenterology;  Laterality: N/A;   CORONARY ANGIOPLASTY WITH STENT PLACEMENT Left 06/25/2013   Procedure: CORONARY ANGIOPLASTY WITH STENT PLACEMENT; Location: [facility]; Surgeon: Dayton Dash, MD   KNEE ARTHROPLASTY Left 01/22/2017   Procedure: COMPUTER ASSISTED TOTAL KNEE ARTHROPLASTY;  Surgeon: Mardee Lynwood SQUIBB, MD;  Location: [facility];  Service: Orthopedics;  Laterality: Left;   KNEE ARTHROSCOPY Left 02/01/2015   Procedure: ARTHROSCOPY KNEE, partial medial & lateral menisectomy,,medial and patelofemoral chondroplasty;  Surgeon: Lynwood SQUIBB Mardee, MD;  Location: [facility];  Service: Orthopedics;  Laterality: Left;   LEFT HEART CATH AND CORONARY ANGIOGRAPHY Left 03/10/2012   Procedure: LEFT HEART CATH AND CORONARY ANGIOGRAPHY; Location: [facility]; Surgeon: Marsa Dooms, MD   LITHOTRIPSY Right 1995   RIGHT AXILLARY LIPOMA REMOVAL     TUBAL LIGATION  VARICOSE VEIN SURGERY     Patient Active Problem List   Diagnosis Date Noted   Primary osteoarthritis of hip 02/09/2017   Primary osteoarthritis of knee 02/09/2017   Chronic sinus bradycardia 01/22/2017   Degenerative disc disease, lumbar 01/22/2017   Dizziness 01/22/2017   Nephrolithiasis 01/22/2017   Osteoporosis, 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/22/2017   PVC's (premature ventricular contractions) 01/22/2017   S/P total knee arthroplasty 01/22/2017   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   CKD (chronic kidney disease),  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   GERD (gastroesophageal reflux disease) 06/25/2013   Coronary artery disease involving native coronary artery of native heart 06/24/2013   Essential hypertension 06/21/2013   Mixed hyperlipidemia 06/21/2013    ONSET DATE: 05/08/23 referral date, dx with PD in 2019  REFERRING DIAG: Parkinson's Disease  THERAPY DIAG:  Cognitive communication deficit  Rationale for Evaluation and Treatment Rehabilitation  SUBJECTIVE:   SUBJECTIVE STATEMENT: Katelyn Crawford alert, pleasant, and cooperative.  Katelyn Crawford accompanied by: self  PERTINENT HISTORY: Katelyn Crawford is an 83 y.o. 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 Katelyn Crawford with PMHx GERD, HTN, CAD, and osteoathritis. Per chart review, Katelyn Crawford previously participated in LSVT in 2023. Noted, MBS on 04/12/20 with findings of mild oral dysphagia. Regular diet and thin liquids was recommended at that time.   DIAGNOSTIC FINDINGS: MRI brain, 10/26/22, No acute finding by MRI. Moderate chronic small-vessel ischemic changes of the cerebral hemispheric white matter, which is considerably progressive when compared to the study of 2019.  PAIN:  Are you having pain? No  FALLS: Has patient fallen in last 6 months?  See Katelyn Crawford evaluation for details  LIVING ENVIRONMENT: Lives with: lives alone; daughter lives next door   PLOF:  Level of assistance: Independent with ADLs Employment: Retired  PATIENT GOALS    for family to hear me  OBJECTIVE:  TODAY'S TREATMENT:  Reviewed semantic features analysis for improved repair of communication breakdowns due to wordfinding deficits. Katelyn Crawford required min/mod to utilize for x8 common objects. Will continue to practice in upcoming sessions.   PATIENT EDUCATION: Education details: as above Person educated: Patient Education method: Explanation Education comprehension: verbalized understanding and needs further education   HOME EXERCISE  PROGRAM: Practice SFA; continue voice ex's previously recommendation   GOALS: Goals reviewed with patient? Yes  SHORT TERM GOALS: Target date: 10 sessions  Katelyn Crawford will participate in further cognitive-linguistic assessment with additional goals, as appropriate. Baseline: Goal status: MET  2.  The patient will complete Daily Tasks (Maximum duration ah, High/Lows, and Functional Phrases) at average loudness >/= 80 dB and with loud, good quality voice with min cues.  Baseline:  Goal status: MET  3.  The patient will complete Hierarchal Speech Loudness reading drills (words/phrases, sentences) at average >/= 75 dB and with loud, good quality voice with min cues.  Baseline:  Goal status: MET  4.  The patient will participate in 5-8 minutes conversation, maintaining average loudness of 75 dB and good quality voice with modified independence.  Baseline:  Goal status: MET    LONG TERM GOALS: Target date: 12 weeks  The patient will complete Daily Tasks (Maximum duration ah, High/Lows, and Functional Phrases) at average loudness >/= 80 dB and with loud, good quality voice.  Baseline:  Goal status: MET  2.  The patient will complete Hierarchal Speech Loudness reading drills (words/phrases, sentences, and paragraph) at average >/= 75 dB and with loud, good quality voice.  Baseline:  Goal status: MET  3.  The patient will participate in 20 minutes conversation, maintaining average loudness of >/= 75 dB and loud, good quality voice.  Baseline:  Goal status: MET  4.  The patient will report improved communication effectiveness as measured by the Communication Effectiveness Survey.  Baseline: 05/12/23: 12/32 Goal status: PROGRESSING  **New goal- set 07/09/2023 5. With Min A, patient will complete a semantic feature analysis with at least 3 relevant features for 4/5 target words to improve word-finding skills. 4  Baseline:  Goal status: PROGRESSING      ASSESSMENT:  CLINICAL  IMPRESSION: Katelyn Crawford is an 83 y.o. 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 Katelyn Crawford with PMHx GERD, HTN, CAD, and osteoathritis. Per chart review, Katelyn Crawford previously participated in LSVT in 2023. Katelyn Crawford has met intensity based ST goals, but endorsed difficulty with wordfing. Mini-ACE given today which noted difficulty with wordfinding/fluency.  See details of tx session above. Recommend course of cognitive-communication therapy targeting wordfinding ability to improve social communication.  OBJECTIVE IMPAIRMENTS include wordfinding difficulty. These impairments are limiting patient from effectively communicating at home and in community. Factors affecting potential to achieve goals and functional outcome are co-morbidities. Patient will benefit from skilled SLP services to address above impairments and improve overall function.  REHAB POTENTIAL: Good  PLAN: SLP FREQUENCY: 2x/week  SLP DURATION: 12 weeks  PLANNED INTERVENTIONS: Cueing hierachy, Internal/external aids, Functional tasks, SLP instruction and feedback, Compensatory strategies, and Patient/family education   Delon Bangs, M.S., CCC-SLP Speech-Language Pathologist [place] [facility] [phone] [place])   [facility] at [facility] [address] [place], [place], [address] Phone: [phone]   Fax:  [phone]

## 2023-07-21 ENCOUNTER — Ambulatory Visit: Payer: Medicare Other | Admitting: Physical Therapy

## 2023-07-21 ENCOUNTER — Ambulatory Visit

## 2023-07-21 DIAGNOSIS — R41841 Cognitive communication deficit: Secondary | ICD-10-CM

## 2023-07-21 DIAGNOSIS — M6281 Muscle weakness (generalized): Secondary | ICD-10-CM | POA: Diagnosis not present

## 2023-07-21 NOTE — Therapy (Signed)
 OUTPATIENT SPEECH LANGUAGE PATHOLOGY [facility]   Patient Name: Katelyn Crawford MRN: [id number] DOB:[birth year], 83 y.o., female Today's Date: 07/21/2023  PCP: Reyes Costa, MD REFERRING PROVIDER: Jannett Fairly, MD    End of Session - 07/21/23 1057     Visit Number 14    Number of Visits 24    Date for SLP Re-Evaluation 08/04/23    SLP Start Time 1100    SLP Stop Time  1145    SLP Time Calculation (min) 45 min    Activity Tolerance Patient tolerated treatment well          Past Medical History:  Diagnosis Date   Anemia    Aortic atherosclerosis (HCC)    Arthritis    Cardiomegaly    Cardiomyopathy (HCC)    Cholelithiasis    Chronic kidney disease, stage 2 (mild)    Chronic sinus bradycardia    Coronary artery disease 03/10/2012   a.) LHC 03/10/2012: 40% mLAD, 50% pRI, 50% pRCA, 90% mRCA, 50% dRCA, 50% RPDA - unable to cannulate RCA - med mgmt; b.) LHC/PCI 06/25/2013: 30% pLAD, 40% mLAD, 20% mLCx, 30% RI, 30% pRCA, 70% mRCA (2.5 mm Promus DES), 40% dRCA-1, 90% dRCA-2 (2.5 mm Promus DES)   DDD (degenerative disc disease), thoracolumbar    Diverticulosis    Dizziness    Environmental allergies    Full dentures    GERD (gastroesophageal reflux disease) 06/25/2013   H/O bilateral cataract extraction 2019   History of diverticulosis    History of kidney stones    HLD (hyperlipidemia)    Hyperlipidemia, unspecified    Hypertension    Nephrolithiasis    Osteoporosis, post-menopausal    Parkinson disease (HCC)    Pre-diabetes    PVC's (premature ventricular contractions)    Scoliosis    Thoracic kyphosis    Trifascicular bundle branch block    a.) 1st degree AVB + RBBB + LAFB   Vascular disease    Past Surgical History:  Procedure Laterality Date   APPENDECTOMY     BREAST EXCISIONAL BIOPSY Left 1984   neg   BREAST EXCISIONAL BIOPSY Right 1997   lipoma   CATARACT EXTRACTION W/PHACO Left 09/02/2017   Procedure: CATARACT EXTRACTION PHACO AND INTRAOCULAR  LENS PLACEMENT (IOC)  LEFT;  Surgeon: Mittie Gaskin, MD;  Location: [facility];  Service: Ophthalmology;  Laterality: Left;   CATARACT EXTRACTION W/PHACO Right 10/01/2017   Procedure: CATARACT EXTRACTION PHACO AND INTRAOCULAR LENS PLACEMENT (IOC) RIGHT;  Surgeon: Mittie Gaskin, MD;  Location: [facility];  Service: Ophthalmology;  Laterality: Right;   COLONOSCOPY WITH PROPOFOL  N/A 11/27/2016   Procedure: COLONOSCOPY WITH PROPOFOL ;  Surgeon: Toledo, Ladell MARLA, MD;  Location: [facility];  Service: Gastroenterology;  Laterality: N/A;   CORONARY ANGIOPLASTY WITH STENT PLACEMENT Left 06/25/2013   Procedure: CORONARY ANGIOPLASTY WITH STENT PLACEMENT; Location: [facility]; Surgeon: Dayton Dash, MD   KNEE ARTHROPLASTY Left 01/22/2017   Procedure: COMPUTER ASSISTED TOTAL KNEE ARTHROPLASTY;  Surgeon: Mardee Lynwood SQUIBB, MD;  Location: [facility];  Service: Orthopedics;  Laterality: Left;   KNEE ARTHROSCOPY Left 02/01/2015   Procedure: ARTHROSCOPY KNEE, partial medial & lateral menisectomy,,medial and patelofemoral chondroplasty;  Surgeon: Lynwood SQUIBB Mardee, MD;  Location: [facility];  Service: Orthopedics;  Laterality: Left;   LEFT HEART CATH AND CORONARY ANGIOGRAPHY Left 03/10/2012   Procedure: LEFT HEART CATH AND CORONARY ANGIOGRAPHY; Location: [facility]; Surgeon: Marsa Dooms, MD   LITHOTRIPSY Right 1995   RIGHT AXILLARY LIPOMA REMOVAL     TUBAL LIGATION  VARICOSE VEIN SURGERY     Patient Active Problem List   Diagnosis Date Noted   Primary osteoarthritis of hip 02/09/2017   Primary osteoarthritis of knee 02/09/2017   Chronic sinus bradycardia 01/22/2017   Degenerative disc disease, lumbar 01/22/2017   Dizziness 01/22/2017   Nephrolithiasis 01/22/2017   Osteoporosis, 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/22/2017   PVC's (premature ventricular contractions) 01/22/2017   S/P total knee arthroplasty 01/22/2017   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  Pt accompanied by: self  PERTINENT HISTORY: Pt is an 83 y.o. 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 Pt with PMHx GERD, HTN, CAD, and osteoathritis. Per chart review, pt previously participated in LSVT in 2023. Noted, MBS on 04/12/20 with findings of mild oral dysphagia. Regular diet and thin liquids was recommended at that time.   DIAGNOSTIC FINDINGS: MRI brain, 10/26/22, No acute finding by MRI. Moderate chronic small-vessel ischemic changes of the cerebral hemispheric white matter, which is considerably progressive when compared to the study of 2019.  PAIN:  Are you having pain? No  FALLS: Has patient fallen in last 6 months?  See PT evaluation for details  LIVING ENVIRONMENT: Lives with: lives alone; daughter lives next door   PLOF:  Level of assistance: Independent with ADLs Employment: Retired  PATIENT GOALS    for family to hear me  OBJECTIVE:  TODAY'S TREATMENT:  Reviewed semantic features analysis for improved repair of communication breakdowns due to wordfinding deficits. Pt required min cues to utilize for x10 common objects and min/mod cues to utilize during x2 conversational breakdowns.  Will continue to practice in upcoming sessions.   PATIENT EDUCATION: Education details: as above Person educated: Patient Education method: Explanation Education comprehension: verbalized  understanding and needs further education   HOME EXERCISE PROGRAM: Practice SFA; continue voice ex's previously recommendation   GOALS: Goals reviewed with patient? Yes  SHORT TERM GOALS: Target date: 10 sessions  Pt will participate in further cognitive-linguistic assessment with additional goals, as appropriate. Baseline: Goal status: MET  2.  The patient will complete Daily Tasks (Maximum duration ah, High/Lows, and Functional Phrases) at average loudness >/= 80 dB and with loud, good quality voice with min cues.  Baseline:  Goal status: MET  3.  The patient will complete Hierarchal Speech Loudness reading drills (words/phrases, sentences) at average >/= 75 dB and with loud, good quality voice with min cues.  Baseline:  Goal status: MET  4.  The patient will participate in 5-8 minutes conversation, maintaining average loudness of 75 dB and good quality voice with modified independence.  Baseline:  Goal status: MET    LONG TERM GOALS: Target date: 12 weeks  The patient will complete Daily Tasks (Maximum duration ah, High/Lows, and Functional Phrases) at average loudness >/= 80 dB and with loud, good quality voice.  Baseline:  Goal status: MET  2.  The patient will complete Hierarchal Speech Loudness reading drills (words/phrases, sentences, and paragraph) at average >/= 75 dB and with loud,  good quality voice.  Baseline:  Goal status: MET  3.  The patient will participate in 20 minutes conversation, maintaining average loudness of >/= 75 dB and loud, good quality voice.  Baseline:  Goal status: MET  4.  The patient will report improved communication effectiveness as measured by the Communication Effectiveness Survey.  Baseline: 05/12/23: 12/32 Goal status: PROGRESSING  **New goal- set 07/09/2023 5. With Min A, patient will complete a semantic feature analysis with at least 3 relevant features for 4/5 target words to improve word-finding skills.  4  Baseline:  Goal status: PROGRESSING      ASSESSMENT:  CLINICAL IMPRESSION: Pt is an 83 y.o. 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 Pt with PMHx GERD, HTN, CAD, and osteoathritis. Per chart review, pt previously participated in LSVT in 2023. Pt has met intensity based ST goals, but endorsed difficulty with wordfing. Mini-ACE given today which noted difficulty with wordfinding/fluency.  See details of tx session above. Recommend course of cognitive-communication therapy targeting wordfinding ability to improve social communication.  OBJECTIVE IMPAIRMENTS include wordfinding difficulty. These impairments are limiting patient from effectively communicating at home and in community. Factors affecting potential to achieve goals and functional outcome are co-morbidities. Patient will benefit from skilled SLP services to address above impairments and improve overall function.  REHAB POTENTIAL: Good  PLAN: SLP FREQUENCY: 2x/week  SLP DURATION: 12 weeks  PLANNED INTERVENTIONS: Cueing hierachy, Internal/external aids, Functional tasks, SLP instruction and feedback, Compensatory strategies, and Patient/family education   Delon Bangs, M.S., CCC-SLP Speech-Language Pathologist [place] [facility] [phone] [place])   [facility] at [facility] [address] [place], [place], [address] Phone: [phone]   Fax:  [phone]

## 2023-07-23 ENCOUNTER — Ambulatory Visit

## 2023-07-23 ENCOUNTER — Ambulatory Visit: Payer: Medicare Other | Attending: Neurology | Admitting: Physical Therapy

## 2023-07-23 DIAGNOSIS — R41841 Cognitive communication deficit: Secondary | ICD-10-CM | POA: Insufficient documentation

## 2023-07-23 DIAGNOSIS — G8929 Other chronic pain: Secondary | ICD-10-CM | POA: Insufficient documentation

## 2023-07-23 DIAGNOSIS — R2681 Unsteadiness on feet: Secondary | ICD-10-CM | POA: Insufficient documentation

## 2023-07-23 DIAGNOSIS — G20A1 Parkinson's disease without dyskinesia, without mention of fluctuations: Secondary | ICD-10-CM | POA: Diagnosis present

## 2023-07-23 DIAGNOSIS — R278 Other lack of coordination: Secondary | ICD-10-CM | POA: Insufficient documentation

## 2023-07-23 DIAGNOSIS — M546 Pain in thoracic spine: Secondary | ICD-10-CM | POA: Diagnosis present

## 2023-07-23 DIAGNOSIS — M6281 Muscle weakness (generalized): Secondary | ICD-10-CM | POA: Insufficient documentation

## 2023-07-23 DIAGNOSIS — R262 Difficulty in walking, not elsewhere classified: Secondary | ICD-10-CM | POA: Insufficient documentation

## 2023-07-23 DIAGNOSIS — R471 Dysarthria and anarthria: Secondary | ICD-10-CM | POA: Insufficient documentation

## 2023-07-23 DIAGNOSIS — R2689 Other abnormalities of gait and mobility: Secondary | ICD-10-CM | POA: Insufficient documentation

## 2023-07-23 NOTE — Therapy (Signed)
 OUTPATIENT PHYSICAL THERAPY THORACOLUMBAR TREATMENT     Patient Name: Katelyn Crawford MRN: [id number] DOB:04/10/1940, 83 y.o., female Today's Date: 07/23/2023   END OF SESSION:   PT End of Session - 07/23/23 1007     Visit Number 37    Number of Visits 47    Date for PT Re-Evaluation 09/29/23    Progress Note Due on Visit 40    PT Start Time 1017    PT Stop Time 1057    PT Time Calculation (min) 40 min    Equipment Utilized During Treatment Gait belt    Activity Tolerance Patient tolerated treatment well    Behavior During Therapy WFL for tasks assessed/performed                Past Medical History:  Diagnosis Date   Anemia    Aortic atherosclerosis (HCC)    Arthritis    Cardiomegaly    Cardiomyopathy (HCC)    Cholelithiasis    Chronic kidney disease, stage 2 (mild)    Chronic sinus bradycardia    Coronary artery disease 03/10/2012   a.) LHC 03/10/2012: 40% mLAD, 50% pRI, 50% pRCA, 90% mRCA, 50% dRCA, 50% RPDA - unable to cannulate RCA - med mgmt; b.) LHC/PCI 06/25/2013: 30% pLAD, 40% mLAD, 20% mLCx, 30% RI, 30% pRCA, 70% mRCA (2.5 mm Promus DES), 40% dRCA-1, 90% dRCA-2 (2.5 mm Promus DES)   DDD (degenerative disc disease), thoracolumbar    Diverticulosis    Dizziness    Environmental allergies    Full dentures    GERD (gastroesophageal reflux disease) 06/25/2013   H/O bilateral cataract extraction 2019   History of diverticulosis    History of kidney stones    HLD (hyperlipidemia)    Hyperlipidemia, unspecified    Hypertension    Nephrolithiasis    Osteoporosis, post-menopausal    Parkinson disease (HCC)    Pre-diabetes    PVC's (premature ventricular contractions)    Scoliosis    Thoracic kyphosis    Trifascicular bundle branch block    a.) 1st degree AVB + RBBB + LAFB   Vascular disease    Past Surgical History:  Procedure Laterality Date   APPENDECTOMY     BREAST EXCISIONAL BIOPSY Left 1984   neg   BREAST EXCISIONAL BIOPSY Right 1997   lipoma    CATARACT EXTRACTION W/PHACO Left 09/02/2017   Procedure: CATARACT EXTRACTION PHACO AND INTRAOCULAR LENS PLACEMENT (IOC)  LEFT;  Surgeon: Mittie Gaskin, MD;  Location: [facility];  Service: Ophthalmology;  Laterality: Left;   CATARACT EXTRACTION W/PHACO Right 10/01/2017   Procedure: CATARACT EXTRACTION PHACO AND INTRAOCULAR LENS PLACEMENT (IOC) RIGHT;  Surgeon: Mittie Gaskin, MD;  Location: [facility];  Service: Ophthalmology;  Laterality: Right;   COLONOSCOPY WITH PROPOFOL  N/A 11/27/2016   Procedure: COLONOSCOPY WITH PROPOFOL ;  Surgeon: Toledo, Ladell MARLA, MD;  Location: [facility];  Service: Gastroenterology;  Laterality: N/A;   CORONARY ANGIOPLASTY WITH STENT PLACEMENT Left 06/25/2013   Procedure: CORONARY ANGIOPLASTY WITH STENT PLACEMENT; Location: [facility]; Surgeon: Dayton Dash, MD   KNEE ARTHROPLASTY Left 01/22/2017   Procedure: COMPUTER ASSISTED TOTAL KNEE ARTHROPLASTY;  Surgeon: Mardee Lynwood SQUIBB, MD;  Location: [facility];  Service: Orthopedics;  Laterality: Left;   KNEE ARTHROSCOPY Left 02/01/2015   Procedure: ARTHROSCOPY KNEE, partial medial & lateral menisectomy,,medial and patelofemoral chondroplasty;  Surgeon: Lynwood SQUIBB Mardee, MD;  Location: [facility];  Service: Orthopedics;  Laterality: Left;   LEFT HEART CATH AND CORONARY ANGIOGRAPHY Left 03/10/2012   Procedure:  LEFT HEART CATH AND CORONARY ANGIOGRAPHY; Location: [facility]; Surgeon: Marsa Dooms, MD   LITHOTRIPSY Right 1995   RIGHT AXILLARY LIPOMA REMOVAL     TUBAL LIGATION     VARICOSE VEIN SURGERY     Patient Active Problem List   Diagnosis Date Noted   Primary osteoarthritis of hip 02/09/2017   Primary osteoarthritis of knee 02/09/2017   Chronic sinus bradycardia 01/22/2017   Degenerative disc disease, lumbar 01/22/2017   Dizziness 01/22/2017   Nephrolithiasis 01/22/2017   Osteoporosis, 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/22/2017   PVC's (premature ventricular contractions) 01/22/2017   S/P total knee  arthroplasty 01/22/2017   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   CKD (chronic kidney disease), 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   GERD (gastroesophageal reflux disease) 06/25/2013   Coronary artery disease involving native coronary artery of native heart 06/24/2013   Essential hypertension 06/21/2013   Mixed hyperlipidemia 06/21/2013    PCP: Auston Reyes BIRCH, MD   REFERRING PROVIDER: Meeler, Benton CROME, FNP  REFERRING DIAG:  Diagnosis  [phone] (ICD-10-CM) - Lumbar stenosis with neurogenic claudication  M54.14 (ICD-10-CM) - Thoracic radiculitis    Rationale for Evaluation and Treatment: Rehabilitation  THERAPY DIAG:  Muscle weakness (generalized)  Unsteadiness on feet  Other abnormalities of gait and mobility  Difficulty in walking, not elsewhere classified  Chronic bilateral thoracic back pain  ONSET DATE: >3 months   SUBJECTIVE:                                                                                                                                                                                           SUBJECTIVE STATEMENT:  Pt states that she saw Dr Hooten for knee pain from bakers cyst, but reports that pain stopped 1 day prior to appointment. No significant change in medical management of knee pain. Is wearing wrist brace on the RUE on this day. States mild pain in Bil wrists, but does not rate. No other complaints, but reports need for new HEP print out.   PERTINENT HISTORY:   Hx of PD and familiar to this clinic for PD management.  From recent MD appointment:  The patient is a pleasant 83 year old female who presents today for acute on chronic bilateral low back pain without radiation to the lower extremities. She describes pain that began June 2020 without injury. She also states that she has had some form of back pain for many years. She describes her pain as mild to moderate that is sharp and intermittent. Bending and lifting increases her pain. Rest can help  alleviate her pain. She participated in physical therapy at [facility] clinic in early 2020 with little relief. She continues to  stay active in her home and notices that household activities increase her pain such as vacuuming and mopping. Medications include Tylenol  (mild to moderate relief).  She was last evaluated on 10/09/2022 at which time she was doing well. She was to continue with heat and home cycling along with physical therapy exercises. She was to continue with tramadol  nightly as needed.  At today's visit she reports that she is experience of increasing pain in the morning in her mid back about the bra line. She would like to return to physical therapy and she states that this was very helpful in managing her pain. We discussed if her pain was to continue consideration would be made for another epidural. She states that she is taking tramadol  at night and doing well with this. Denies any injury. Upon palpation today she is without tenderness to the thoracic paraspinal musculature.   PAIN:  Are you having pain? Yes: NPRS scale: 0/10 Pain location: middle back Pain description: ache Aggravating factors: sleeping Relieving factors: tramadol    PRECAUTIONS: Fall   RED FLAGS: None   WEIGHT BEARING RESTRICTIONS: No  FALLS:  Has patient fallen in last 6 months? No  LIVING ENVIRONMENT: Lives with: lives alone and daughter lives next door. Lives in: House/apartment Stairs: Yes: External: 4 steps; on right going up and on left going up Has following equipment at home:  None, but has use cane in the past. Has shower rails, shower chair; doesn't use  OCCUPATION: retired   PLOF: Independent, Independent with basic ADLs, and Independent with gait  PATIENT GOALS: stronger. Improve mobility   NEXT MD VISIT: unsure   OBJECTIVE:  Note: Objective measures were completed at Evaluation unless otherwise noted.  DIAGNOSTIC FINDINGS:  No recent imaging   PATIENT SURVEYS:  Modified  Oswestry 28%   COGNITION: Overall cognitive status: Within functional limits for tasks assessed     SENSATION: WFL  MUSCLE LENGTH: WFL  POSTURE: rounded shoulders, forward head, and decreased lumbar lordosis  PALPATION: Noted tightness in upper thoracic paraspinal. No pain to palpation   LUMBAR ROM:   AROM eval  Flexion WFL  Extension 20  Right lateral flexion   Left lateral flexion   Right rotation minimal  Left rotation ~10 deg   Cervical  AROM eval  Flexion 45  Extension 18  Right lateral flexion 20  Left lateral flexion 12  Right rotation 50  Left rotation  40  Thoracic  AROM eval  Flexion WFL  Extension 10  Right lateral flexion 25  Left lateral flexion 20  Right rotation minimal  Left rotation ~10 deg      (Blank rows = not tested)  LOWER EXTREMITY ROM:     Grossly WFL  LOWER EXTREMITY MMT:    MMT Right eval Left eval  Hip flexion 4- 4-  Hip extension    Hip abduction 4- 4-  Hip adduction 4 4  Hip internal rotation    Hip external rotation    Knee flexion 4 4  Knee extension 4+ 4+  Ankle dorsiflexion 4 4  Ankle plantarflexion    Ankle inversion    Ankle eversion     (Blank rows = not tested)    FUNCTIONAL TESTS:  5 times sit to stand: 16  Timed up and go (TUG): 12.3 sce 6 minute walk test: 1139ft 10 meter walk test: 0.49m/s Functional gait assessment: 21  4/21: miniBest: 19/28   GAIT: Distance walked: 70 Assistive device utilized: None Level of assistance: Complete Independence  Comments: flexed posture.   TREATMENT DATE: 07/23/2023  Nustep reciprocal/activity tolerance training with neurologic priming x 5 min level 1-4.   HEP review with handout re-printed.  Strongly encourage pt to perform at least once per day when not in PT to improve thoracic strengthening and improved rotational and large amplitude movements.  Standing lateral step with BUE abduction x 10 bil  Seated lateral reach/cross body reach x 10 bil  Seated  forward/up/out each with BUE, x 10  Sit<>stand with UE swing into extension. X 10  Standing march with stomp x 10 bil  Side stepping at rail x 8 bil   Standing cross body reach x 10 bil  Seated mid row x 10 bil with RTB  Cues for improved posture and trnuk extneison for all movements, min assist only for shoulder abduction with lateral stepping. Able to reproduce all movements with visual feedback initially, then continued without mirroring from PT.   PATIENT EDUCATION:  Education details:exercise technique, large-amplitude training/purpose, techniques for donning jacket, finger flicks Person educated: Patient Education method: IT trainer, VC Education comprehension: verbalized understanding, returned demo  HOME EXERCISE PROGRAM: Access Code: [facility] URL: [email or web address] Date: 07/16/2023 Prepared by: Massie Dollar  Exercises - Step Sideways with Arms Reaching  - 1 x daily - 7 x weekly - 3 sets - 10 reps - 2 hold - Standing Reach to Opposite Side with Weight Shift  - 1 x daily - 7 x weekly - 3 sets - 10 reps - 2 hold - Sit to Stand with Arm Swing  - 1 x daily - 7 x weekly - 3 sets - 10 reps - Seated Reach Forward, Up, and To Sides  - 1 x daily - 7 x weekly - 3 sets - 10 reps - Seated Reaching to Side and Across Body  - 1 x daily - 7 x weekly - 3 sets - 10 reps - Seated Shoulder Row with Anchored Resistance  - 1 x daily - 7 x weekly - 3 sets - 10 reps - Standing March with Counter Support  - 1 x daily - 7 x weekly - 3 sets - 10 reps - Side Stepping with Counter Support  - 1 x daily - 7 x weekly - 3 sets - 10 reps  ASSESSMENT:  CLINICAL IMPRESSION:  Patient presents with good motivation for PT visit today. No pain in back at start treatment on this day. HEP reviewed with new hand out provided to address thoracic weakness and PD deficits with rotational movement and reduced parascapular movement. Instructed pt to perform HEP at least 1 per day to maintain  mobility and manage back pain.   Pt  will benefit from continued skilled PT to address back pain improve strength, increase endurance, and improve overall functional mobility.   OBJECTIVE IMPAIRMENTS: Abnormal gait, cardiopulmonary status limiting activity, decreased activity tolerance, decreased balance, decreased endurance, decreased mobility, difficulty walking, decreased ROM, decreased strength, hypomobility, increased fascial restrictions, impaired perceived functional ability, impaired flexibility, impaired UE functional use, improper body mechanics, and postural dysfunction.   ACTIVITY LIMITATIONS: carrying, squatting, sleeping, and locomotion level  PARTICIPATION LIMITATIONS: cleaning, laundry, shopping, and community activity  PERSONAL FACTORS: Age, Fitness, and 1-2 comorbidities: lumbar stenosis  are also affecting patient's functional outcome.   REHAB POTENTIAL: Good  CLINICAL DECISION MAKING: Stable/uncomplicated  EVALUATION COMPLEXITY: Low   GOALS: Goals reviewed with patient? Yes   SHORT TERM GOALS: Target date: 06/09/2023    Patient will be independent in home exercise program to  improve strength/mobility for better functional independence with ADLs. Baseline: to be given at session 2.  05/07/2023: need to be re-assessed Goal status: INITIAL   LONG TERM GOALS: Target date: 09/29/2023      Patient will improve Mod ODI score by 12.8 points  to demonstrate statistically significant improvement in mobility and quality of life.  Baseline: 28% 3/12: 26% 05/07/2023: 32% indicating moderate disability 5/19/: 24% 07/07/2023= 28% Goal status: IN PROGRESS  2.  Patient (> 39 years old) will complete five times sit to stand test in < 15 seconds indicating an increased LE strength and improved balance. Baseline: 16 sec  3/12: 11.81. sec  05/07/2023: 11.09 seconds 5/19: 10.41 sec no UE support  Goal status: MET   3.  Patient will increase 6 min walk test score by >150  to demonstrated improved access to community and improve safety.   Baseline: 1173ft 3/12: 1279ft 05/07/2023: 1251ft 5/19: 117ft  Goal status: IN PROGRESS  4.  Patient will increase 10 meter walk test to >1.57m/s as to improve gait speed for better community ambulation and to reduce fall risk. Baseline: 0.78m/s  3/12: 1.37m/s 05/07/2023: 1.098 m/s no AD, independently  Goal status: MET  5.  Patient will reduce timed up and go to <11 seconds to reduce fall risk and demonstrate improved transfer/gait ability. Baseline: 13sec  3/12; 12.5sec  05/07/2023: 11.01 seconds no AD, independently Goal status: MET   6.  Patient will reduced back pain to 3/10 at worst to indicate improved function with household tasks.  Baseline: 9/10 at night.  3/12: 8/10, when waking up in the morning 05/07/2023: continues to report back pain gets as high as 8/10 when bending forward 6/16= no pain presently- still up to 10/10 mid to low back intermittent.  Goal status: IN PROGRESS  7. Patient will increase MiniBest Test score to >18/28 to indicate a reduced risk for falling and demonstrate increased independence with functional mobility and ADLs.  Baseline: 5/28: 15; 07/07/2023= 18  Goal status: PROGRESSING  8.  Patient will increase Functional Gait Assessment (FGA) score to >24/30 as to reduce fall risk and improve dynamic gait safety with community ambulation. Baseline: 5/19: 22; 07/07/2023=22 Goal Status: REVISED PLAN:  PT FREQUENCY: 1x/week  PT DURATION: 12 weeks  PLANNED INTERVENTIONS: 97110-Therapeutic exercises, 97530- Therapeutic activity, 97112- Neuromuscular re-education, 97535- Self Care, [id number]- Manual therapy, 97116- Gait training, 97014- Electrical stimulation (unattended), [phone]- Electrical stimulation (manual), Balance training, Stair training, Taping, Dry Needling, Joint mobilization, Joint manipulation, Spinal manipulation, Spinal mobilization, Scar mobilization, DME instructions, Cryotherapy,  and Moist heat.  PLAN FOR NEXT SESSION:   Continued to manage Back pain and PD deficits.  Dynamic gait and posture training.  Encourage pt to perform HEP.   Massie Dollar PT, DPT  Physical Therapist - [place]  [facility]  10:30 AM 07/23/23

## 2023-07-23 NOTE — Therapy (Signed)
 OUTPATIENT SPEECH LANGUAGE PATHOLOGY [facility]S TREATMENT   Patient Name: Katelyn Crawford MRN: [id number] DOB:[birth year], 83 y.o., female Today's Date: 07/23/2023  PCP: Reyes Costa, MD REFERRING PROVIDER: Jannett Fairly, MD    End of Session - 07/23/23 1255     Visit Number 15    Number of Visits 24    Date for SLP Re-Evaluation 08/04/23    SLP Start Time 1100    SLP Stop Time  1145    SLP Time Calculation (min) 45 min    Activity Tolerance Patient tolerated treatment well          Past Medical History:  Diagnosis Date   Anemia    Aortic atherosclerosis (HCC)    Arthritis    Cardiomegaly    Cardiomyopathy (HCC)    Cholelithiasis    Chronic kidney disease, stage 2 (mild)    Chronic sinus bradycardia    Coronary artery disease 03/10/2012   a.) LHC 03/10/2012: 40% mLAD, 50% pRI, 50% pRCA, 90% mRCA, 50% dRCA, 50% RPDA - unable to cannulate RCA - med mgmt; b.) LHC/PCI 06/25/2013: 30% pLAD, 40% mLAD, 20% mLCx, 30% RI, 30% pRCA, 70% mRCA (2.5 mm Promus DES), 40% dRCA-1, 90% dRCA-2 (2.5 mm Promus DES)   DDD (degenerative disc disease), thoracolumbar    Diverticulosis    Dizziness    Environmental allergies    Full dentures    GERD (gastroesophageal reflux disease) 06/25/2013   H/O bilateral cataract extraction 2019   History of diverticulosis    History of kidney stones    HLD (hyperlipidemia)    Hyperlipidemia, unspecified    Hypertension    Nephrolithiasis    Osteoporosis, post-menopausal    Parkinson disease (HCC)    Pre-diabetes    PVC's (premature ventricular contractions)    Scoliosis    Thoracic kyphosis    Trifascicular bundle branch block    a.) 1st degree AVB + RBBB + LAFB   Vascular disease    Past Surgical History:  Procedure Laterality Date   APPENDECTOMY     BREAST EXCISIONAL BIOPSY Left 1984   neg   BREAST EXCISIONAL BIOPSY Right 1997   lipoma   CATARACT EXTRACTION W/PHACO Left 09/02/2017   Procedure: CATARACT EXTRACTION PHACO AND INTRAOCULAR LENS  PLACEMENT (IOC)  LEFT;  Surgeon: Mittie Gaskin, MD;  Location: [facility];  Service: Ophthalmology;  Laterality: Left;   CATARACT EXTRACTION W/PHACO Right 10/01/2017   Procedure: CATARACT EXTRACTION PHACO AND INTRAOCULAR LENS PLACEMENT (IOC) RIGHT;  Surgeon: Mittie Gaskin, MD;  Location: [facility];  Service: Ophthalmology;  Laterality: Right;   COLONOSCOPY WITH PROPOFOL  N/A 11/27/2016   Procedure: COLONOSCOPY WITH PROPOFOL ;  Surgeon: Toledo, Ladell MARLA, MD;  Location: [facility];  Service: Gastroenterology;  Laterality: N/A;   CORONARY ANGIOPLASTY WITH STENT PLACEMENT Left 06/25/2013   Procedure: CORONARY ANGIOPLASTY WITH STENT PLACEMENT; Location: [facility]; Surgeon: Dayton Dash, MD   KNEE ARTHROPLASTY Left 01/22/2017   Procedure: COMPUTER ASSISTED TOTAL KNEE ARTHROPLASTY;  Surgeon: Mardee Lynwood SQUIBB, MD;  Location: [facility];  Service: Orthopedics;  Laterality: Left;   KNEE ARTHROSCOPY Left 02/01/2015   Procedure: ARTHROSCOPY KNEE, partial medial & lateral menisectomy,,medial and patelofemoral chondroplasty;  Surgeon: Lynwood SQUIBB Mardee, MD;  Location: [facility];  Service: Orthopedics;  Laterality: Left;   LEFT HEART CATH AND CORONARY ANGIOGRAPHY Left 03/10/2012   Procedure: LEFT HEART CATH AND CORONARY ANGIOGRAPHY; Location: [facility]; Surgeon: Marsa Dooms, MD   LITHOTRIPSY Right 1995   RIGHT AXILLARY LIPOMA REMOVAL     TUBAL LIGATION  VARICOSE VEIN SURGERY     Patient Active Problem List   Diagnosis Date Noted   Primary osteoarthritis of hip 02/09/2017   Primary osteoarthritis of knee 02/09/2017   Chronic sinus bradycardia 01/22/2017   Degenerative disc disease, lumbar 01/22/2017   Dizziness 01/22/2017   Nephrolithiasis 01/22/2017   Osteoporosis, 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/22/2017   PVC's (premature ventricular contractions) 01/22/2017   S/P total knee arthroplasty 01/22/2017   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   CKD (chronic kidney disease), stage II  08/02/2013   GERD (gastroesophageal reflux disease) 06/25/2013   Coronary artery disease involving native coronary artery of native heart 06/24/2013   Essential hypertension 06/21/2013   Mixed hyperlipidemia 06/21/2013    ONSET DATE: 05/08/23 referral date, dx with PD in 2019  REFERRING DIAG: Parkinson's Disease  THERAPY DIAG:  Cognitive communication deficit  Rationale for Evaluation and Treatment Rehabilitation  SUBJECTIVE:   SUBJECTIVE STATEMENT: Pt alert, pleasant, and cooperative.  Pt accompanied by: self  PERTINENT HISTORY: Pt is an 83 y.o. 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 Pt with PMHx GERD, HTN, CAD, and osteoathritis. Per chart review, pt previously participated in LSVT in 2023. Noted, MBS on 04/12/20 with findings of mild oral dysphagia. Regular diet and thin liquids was recommended at that time.   DIAGNOSTIC FINDINGS: MRI brain, 10/26/22, No acute finding by MRI. Moderate chronic small-vessel ischemic changes of the cerebral hemispheric white matter, which is considerably progressive when compared to the study of 2019.  PAIN:  Are you having pain? No  FALLS: Has patient fallen in last 6 months?  See PT evaluation for details  LIVING ENVIRONMENT: Lives with: lives alone; daughter lives next door   PLOF:  Level of assistance: Independent with ADLs Employment: Retired  PATIENT GOALS    for family to hear me  OBJECTIVE:  TODAY'S TREATMENT:  Reviewed semantic features analysis for improved repair of communication breakdowns due to wordfinding deficits. Pt required rare-min cues to utilize for x10 common objects and min cues to utilize during x2 conversational breakdowns.  Will continue to practice in upcoming sessions.   PATIENT EDUCATION: Education details: as above Person educated: Patient Education method: Explanation Education comprehension: verbalized understanding  and needs further education   HOME EXERCISE PROGRAM: Practice SFA; continue voice ex's previously recommendation   GOALS: Goals reviewed with patient? Yes  SHORT TERM GOALS: Target date: 10 sessions  Pt will participate in further cognitive-linguistic assessment with additional goals, as appropriate. Baseline: Goal status: MET  2.  The patient will complete Daily Tasks (Maximum duration ah, High/Lows, and Functional Phrases) at average loudness >/= 80 dB and with loud, good quality voice with min cues.  Baseline:  Goal status: MET  3.  The patient will complete Hierarchal Speech Loudness reading drills (words/phrases, sentences) at average >/= 75 dB and with loud, good quality voice with min cues.  Baseline:  Goal status: MET  4.  The patient will participate in 5-8 minutes conversation, maintaining average loudness of 75 dB and good quality voice with modified independence.  Baseline:  Goal status: MET    LONG TERM GOALS: Target date: 12 weeks  The patient will complete Daily Tasks (Maximum duration ah, High/Lows, and Functional Phrases) at average loudness >/= 80 dB and with loud, good quality voice.  Baseline:  Goal status: MET  2.  The patient will complete Hierarchal Speech Loudness reading drills (words/phrases, sentences, and paragraph) at average >/= 75 dB and with loud,  good quality voice.  Baseline:  Goal status: MET  3.  The patient will participate in 20 minutes conversation, maintaining average loudness of >/= 75 dB and loud, good quality voice.  Baseline:  Goal status: MET  4.  The patient will report improved communication effectiveness as measured by the Communication Effectiveness Survey.  Baseline: 05/12/23: 12/32 Goal status: PROGRESSING  **New goal- set 07/09/2023 5. With Min A, patient will complete a semantic feature analysis with at least 3 relevant features for 4/5 target words to improve word-finding skills. 4  Baseline:  Goal status:  PROGRESSING      ASSESSMENT:  CLINICAL IMPRESSION: Pt is an 83 y.o. 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 Pt with PMHx GERD, HTN, CAD, and osteoathritis. Per chart review, pt previously participated in LSVT in 2023. Pt has met intensity based ST goals, but endorsed difficulty with wordfing. Mini-ACE given today which noted difficulty with wordfinding/fluency.  See details of tx session above. Recommend course of cognitive-communication therapy targeting wordfinding ability to improve social communication.  OBJECTIVE IMPAIRMENTS include wordfinding difficulty. These impairments are limiting patient from effectively communicating at home and in community. Factors affecting potential to achieve goals and functional outcome are co-morbidities. Patient will benefit from skilled SLP services to address above impairments and improve overall function.  REHAB POTENTIAL: Good  PLAN: SLP FREQUENCY: 2x/week  SLP DURATION: 12 weeks  PLANNED INTERVENTIONS: Cueing hierachy, Internal/external aids, Functional tasks, SLP instruction and feedback, Compensatory strategies, and Patient/family education   Delon Bangs, M.S., CCC-SLP Speech-Language Pathologist [facility] [phone] [place])   [facility] at [facility] [address] [place], [place], [address] Phone: [phone]   Fax:  [phone]

## 2023-07-28 ENCOUNTER — Ambulatory Visit

## 2023-07-28 ENCOUNTER — Ambulatory Visit: Payer: Medicare Other | Admitting: Physical Therapy

## 2023-07-30 ENCOUNTER — Ambulatory Visit

## 2023-07-30 ENCOUNTER — Ambulatory Visit: Payer: Medicare Other | Admitting: Physical Therapy

## 2023-07-30 DIAGNOSIS — R262 Difficulty in walking, not elsewhere classified: Secondary | ICD-10-CM

## 2023-07-30 DIAGNOSIS — R41841 Cognitive communication deficit: Secondary | ICD-10-CM

## 2023-07-30 DIAGNOSIS — R2689 Other abnormalities of gait and mobility: Secondary | ICD-10-CM

## 2023-07-30 DIAGNOSIS — M6281 Muscle weakness (generalized): Secondary | ICD-10-CM | POA: Diagnosis not present

## 2023-07-30 DIAGNOSIS — G8929 Other chronic pain: Secondary | ICD-10-CM

## 2023-07-30 DIAGNOSIS — R471 Dysarthria and anarthria: Secondary | ICD-10-CM

## 2023-07-30 DIAGNOSIS — R2681 Unsteadiness on feet: Secondary | ICD-10-CM

## 2023-07-30 NOTE — Therapy (Signed)
 OUTPATIENT SPEECH LANGUAGE PATHOLOGY [facility]'S DISCHARGE SUMMARY / TREATMENT  Patient Name: Katelyn Crawford MRN: [id number] DOB:Aug 05, 1940, 83 y.o., female Today's Date: 07/30/2023  PCP: Reyes Costa, MD REFERRING PROVIDER: Jannett Fairly, MD    End of Session - 07/30/23 1250     Visit Number 16    Number of Visits 24    Date for SLP Re-Evaluation 08/04/23    SLP Start Time 1100    SLP Stop Time  1145    SLP Time Calculation (min) 45 min    Activity Tolerance Patient tolerated treatment well          Past Medical History:  Diagnosis Date   Anemia    Aortic atherosclerosis (HCC)    Arthritis    Cardiomegaly    Cardiomyopathy (HCC)    Cholelithiasis    Chronic kidney disease, stage 2 (mild)    Chronic sinus bradycardia    Coronary artery disease 03/10/2012   a.) LHC 03/10/2012: 40% mLAD, 50% pRI, 50% pRCA, 90% mRCA, 50% dRCA, 50% RPDA - unable to cannulate RCA - med mgmt; b.) LHC/PCI 06/25/2013: 30% pLAD, 40% mLAD, 20% mLCx, 30% RI, 30% pRCA, 70% mRCA (2.5 mm Promus DES), 40% dRCA-1, 90% dRCA-2 (2.5 mm Promus DES)   DDD (degenerative disc disease), thoracolumbar    Diverticulosis    Dizziness    Environmental allergies    Full dentures    GERD (gastroesophageal reflux disease) 06/25/2013   H/O bilateral cataract extraction 2019   History of diverticulosis    History of kidney stones    HLD (hyperlipidemia)    Hyperlipidemia, unspecified    Hypertension    Nephrolithiasis    Osteoporosis, post-menopausal    Parkinson disease (HCC)    Pre-diabetes    PVC's (premature ventricular contractions)    Scoliosis    Thoracic kyphosis    Trifascicular bundle branch block    a.) 1st degree AVB + RBBB + LAFB   Vascular disease    Past Surgical History:  Procedure Laterality Date   APPENDECTOMY     BREAST EXCISIONAL BIOPSY Left 1984   neg   BREAST EXCISIONAL BIOPSY Right 1997   lipoma   CATARACT EXTRACTION W/PHACO Left 09/02/2017   Procedure: CATARACT EXTRACTION PHACO  AND INTRAOCULAR LENS PLACEMENT (IOC)  LEFT;  Surgeon: Mittie Gaskin, MD;  Location: [facility];  Service: Ophthalmology;  Laterality: Left;   CATARACT EXTRACTION W/PHACO Right 10/01/2017   Procedure: CATARACT EXTRACTION PHACO AND INTRAOCULAR LENS PLACEMENT (IOC) RIGHT;  Surgeon: Mittie Gaskin, MD;  Location: [facility];  Service: Ophthalmology;  Laterality: Right;   COLONOSCOPY WITH PROPOFOL  N/A 11/27/2016   Procedure: COLONOSCOPY WITH PROPOFOL ;  Surgeon: Toledo, Ladell MARLA, MD;  Location: [facility];  Service: Gastroenterology;  Laterality: N/A;   CORONARY ANGIOPLASTY WITH STENT PLACEMENT Left 06/25/2013   Procedure: CORONARY ANGIOPLASTY WITH STENT PLACEMENT; Location: [facility]; Surgeon: Dayton Dash, MD   KNEE ARTHROPLASTY Left 01/22/2017   Procedure: COMPUTER ASSISTED TOTAL KNEE ARTHROPLASTY;  Surgeon: Mardee Lynwood SQUIBB, MD;  Location: [facility];  Service: Orthopedics;  Laterality: Left;   KNEE ARTHROSCOPY Left 02/01/2015   Procedure: ARTHROSCOPY KNEE, partial medial & lateral menisectomy,,medial and patelofemoral chondroplasty;  Surgeon: Lynwood SQUIBB Mardee, MD;  Location: [facility];  Service: Orthopedics;  Laterality: Left;   LEFT HEART CATH AND CORONARY ANGIOGRAPHY Left 03/10/2012   Procedure: LEFT HEART CATH AND CORONARY ANGIOGRAPHY; Location: [facility]; Surgeon: Marsa Dooms, MD   LITHOTRIPSY Right 1995   RIGHT AXILLARY LIPOMA REMOVAL  TUBAL LIGATION     VARICOSE VEIN SURGERY     Patient Active Problem List   Diagnosis Date Noted   Primary osteoarthritis of hip 02/09/2017   Primary osteoarthritis of knee 02/09/2017   Chronic sinus bradycardia 01/22/2017   Degenerative disc disease, lumbar 01/22/2017   Dizziness 01/22/2017   Nephrolithiasis 01/22/2017   Osteoporosis, 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/22/2017   PVC's (premature ventricular contractions) 01/22/2017   S/P total knee arthroplasty 01/22/2017   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  Pt accompanied by: self  PERTINENT HISTORY: Pt is an 83 y.o. 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 Pt with PMHx GERD, HTN, CAD, and osteoathritis. Per chart review, pt previously participated in LSVT in 2023. Noted, MBS on 04/12/20 with findings of mild oral dysphagia. Regular diet and thin liquids was recommended at that time.   DIAGNOSTIC FINDINGS: MRI brain, 10/26/22, No acute finding by MRI. Moderate chronic small-vessel ischemic changes of the cerebral hemispheric white matter, which is considerably progressive when compared to the study of 2019.  PAIN:  Are you having pain? No  FALLS: Has patient fallen in last 6 months?  See PT evaluation for details  LIVING ENVIRONMENT: Lives with: lives alone; daughter lives next door   PLOF:  Level of assistance: Independent with ADLs Employment: Retired  PATIENT GOALS    for family to hear me  OBJECTIVE:  TODAY'S TREATMENT:  Reviewed semantic features analysis for improved repair of communication breakdowns due to wordfinding deficits. Pt required rare cues to utilize for x10 infrequent objects and indep to utilize during x1 conversational breakdowns.    Reviewed HEP for dysarthria, progress to date, and SLP POC.    PATIENT EDUCATION: Education details: as above Person educated: Patient Education  method: Explanation Education comprehension: verbalized understanding and needs further education   HOME EXERCISE PROGRAM: Practice SFA; continue voice ex's previously recommendation   GOALS: Goals reviewed with patient? Yes  SHORT TERM GOALS: Target date: 10 sessions  Pt will participate in further cognitive-linguistic assessment with additional goals, as appropriate. Baseline: Goal status: MET  2.  The patient will complete Daily Tasks (Maximum duration ah, High/Lows, and Functional Phrases) at average loudness >/= 80 dB and with loud, good quality voice with min cues.  Baseline:  Goal status: MET  3.  The patient will complete Hierarchal Speech Loudness reading drills (words/phrases, sentences) at average >/= 75 dB and with loud, good quality voice with min cues.  Baseline:  Goal status: MET  4.  The patient will participate in 5-8 minutes conversation, maintaining average loudness of 75 dB and good quality voice with modified independence.  Baseline:  Goal status: MET    LONG TERM GOALS: Target date: 12 weeks  The patient will complete Daily Tasks (Maximum duration ah, High/Lows, and Functional Phrases) at average loudness >/= 80 dB and with loud, good quality voice.  Baseline:  Goal status: MET  2.  The patient will complete Hierarchal Speech  Loudness reading drills (words/phrases, sentences, and paragraph) at average >/= 75 dB and with loud, good quality voice.  Baseline:  Goal status: MET  3.  The patient will participate in 20 minutes conversation, maintaining average loudness of >/= 75 dB and loud, good quality voice.  Baseline:  Goal status: MET  4.  The patient will report improved communication effectiveness as measured by the Communication Effectiveness Survey.  Baseline: 05/12/23: 12/32; 7/9 23/32 Goal status: MET  **New goal- set 07/09/2023 5. With Min A, patient will complete a semantic feature analysis with at least 3 relevant features for 4/5  target words to improve word-finding skills. 4  Baseline:  Goal status: MET      ASSESSMENT:  CLINICAL IMPRESSION: Pt is an 83 y.o. 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 Pt with PMHx GERD, HTN, CAD, and osteoathritis. Per chart review, pt previously participated in LSVT in 2023. Pt has met all goals and will be discharged from ST this date.  PLAN: D/C ST; all goals met   Delon Bangs, M.S., CCC-SLP Speech-Language Pathologist [facility] [phone] [place])   [facility] Rehabilitation at [facility] [address] [place], [place], [address] Phone: [phone]   Fax:  [phone]

## 2023-07-30 NOTE — Therapy (Signed)
 OUTPATIENT PHYSICAL THERAPY THORACOLUMBAR TREATMENT     Patient Name: Katelyn Crawford MRN: [id number] DOB:1940/09/11, 83 y.o., female Today's Date: 07/30/2023   END OF SESSION:   PT End of Session - 07/30/23 1020     Visit Number 38    Number of Visits 47    Date for PT Re-Evaluation 09/29/23    Progress Note Due on Visit 40    PT Start Time 1018    PT Stop Time 1100    PT Time Calculation (min) 42 min    Equipment Utilized During Treatment Gait belt    Activity Tolerance Patient tolerated treatment well    Behavior During Therapy WFL for tasks assessed/performed                Past Medical History:  Diagnosis Date   Anemia    Aortic atherosclerosis (HCC)    Arthritis    Cardiomegaly    Cardiomyopathy (HCC)    Cholelithiasis    Chronic kidney disease, stage 2 (mild)    Chronic sinus bradycardia    Coronary artery disease 03/10/2012   a.) LHC 03/10/2012: 40% mLAD, 50% pRI, 50% pRCA, 90% mRCA, 50% dRCA, 50% RPDA - unable to cannulate RCA - med mgmt; b.) LHC/PCI 06/25/2013: 30% pLAD, 40% mLAD, 20% mLCx, 30% RI, 30% pRCA, 70% mRCA (2.5 mm Promus DES), 40% dRCA-1, 90% dRCA-2 (2.5 mm Promus DES)   DDD (degenerative disc disease), thoracolumbar    Diverticulosis    Dizziness    Environmental allergies    Full dentures    GERD (gastroesophageal reflux disease) 06/25/2013   H/O bilateral cataract extraction 2019   History of diverticulosis    History of kidney stones    HLD (hyperlipidemia)    Hyperlipidemia, unspecified    Hypertension    Nephrolithiasis    Osteoporosis, post-menopausal    Parkinson disease (HCC)    Pre-diabetes    PVC's (premature ventricular contractions)    Scoliosis    Thoracic kyphosis    Trifascicular bundle branch block    a.) 1st degree AVB + RBBB + LAFB   Vascular disease    Past Surgical History:  Procedure Laterality Date   APPENDECTOMY     BREAST EXCISIONAL BIOPSY Left 1984   neg   BREAST EXCISIONAL BIOPSY Right 1997   lipoma    CATARACT EXTRACTION W/PHACO Left 09/02/2017   Procedure: CATARACT EXTRACTION PHACO AND INTRAOCULAR LENS PLACEMENT (IOC)  LEFT;  Surgeon: Mittie Gaskin, MD;  Location: [facility];  Service: Ophthalmology;  Laterality: Left;   CATARACT EXTRACTION W/PHACO Right 10/01/2017   Procedure: CATARACT EXTRACTION PHACO AND INTRAOCULAR LENS PLACEMENT (IOC) RIGHT;  Surgeon: Mittie Gaskin, MD;  Location: [facility];  Service: Ophthalmology;  Laterality: Right;   COLONOSCOPY WITH PROPOFOL  N/A 11/27/2016   Procedure: COLONOSCOPY WITH PROPOFOL ;  Surgeon: Toledo, Ladell MARLA, MD;  Location: [facility];  Service: Gastroenterology;  Laterality: N/A;   CORONARY ANGIOPLASTY WITH STENT PLACEMENT Left 06/25/2013   Procedure: CORONARY ANGIOPLASTY WITH STENT PLACEMENT; Location: [facility]; Surgeon: Dayton Dash, MD   KNEE ARTHROPLASTY Left 01/22/2017   Procedure: COMPUTER ASSISTED TOTAL KNEE ARTHROPLASTY;  Surgeon: Mardee Lynwood SQUIBB, MD;  Location: [facility];  Service: Orthopedics;  Laterality: Left;   KNEE ARTHROSCOPY Left 02/01/2015   Procedure: ARTHROSCOPY KNEE, partial medial & lateral menisectomy,,medial and patelofemoral chondroplasty;  Surgeon: Lynwood SQUIBB Mardee, MD;  Location: [facility];  Service: Orthopedics;  Laterality: Left;   LEFT HEART CATH AND CORONARY ANGIOGRAPHY Left 03/10/2012   Procedure:  LEFT HEART CATH AND CORONARY ANGIOGRAPHY; Location: [facility]; Surgeon: Marsa Dooms, MD   LITHOTRIPSY Right 1995   RIGHT AXILLARY LIPOMA REMOVAL     TUBAL LIGATION     VARICOSE VEIN SURGERY     Patient Active Problem List   Diagnosis Date Noted   Primary osteoarthritis of hip 02/09/2017   Primary osteoarthritis of knee 02/09/2017   Chronic sinus bradycardia 01/22/2017   Degenerative disc disease, lumbar 01/22/2017   Dizziness 01/22/2017   Nephrolithiasis 01/22/2017   Osteoporosis, 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/22/2017   PVC's (premature ventricular contractions) 01/22/2017   S/P total knee  arthroplasty 01/22/2017   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   CKD (chronic kidney disease), 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   GERD (gastroesophageal reflux disease) 06/25/2013   Coronary artery disease involving native coronary artery of native heart 06/24/2013   Essential hypertension 06/21/2013   Mixed hyperlipidemia 06/21/2013    PCP: Auston Reyes BIRCH, MD   REFERRING PROVIDER: Meeler, Benton CROME, FNP  REFERRING DIAG:  Diagnosis  [phone] (ICD-10-CM) - Lumbar stenosis with neurogenic claudication  M54.14 (ICD-10-CM) - Thoracic radiculitis    Rationale for Evaluation and Treatment: Rehabilitation  THERAPY DIAG:  Muscle weakness (generalized)  Unsteadiness on feet  Other abnormalities of gait and mobility  Difficulty in walking, not elsewhere classified  Chronic bilateral thoracic back pain  ONSET DATE: >3 months   SUBJECTIVE:                                                                                                                                                                                           SUBJECTIVE STATEMENT:  Pt states that she did not take tramadol  last night since it had been several days without pain. But now reports that burning has returned to back and reports increased pain following making breakfast this morning.   States that she completed HEP 4-5 days last week. And states that she perform Back strengthening exercises with theraband on days that she did not perform HEP.   PERTINENT HISTORY:   Hx of PD and familiar to this clinic for PD management.  From recent MD appointment:  The patient is a pleasant 83 year old female who presents today for acute on chronic bilateral low back pain without radiation to the lower extremities. She describes pain that began June 2020 without injury. She also states that she has had some form of back pain for many years. She describes her pain as mild to moderate that is sharp and intermittent. Bending and  lifting increases her pain. Rest can help alleviate her pain. She participated in physical therapy at [facility] clinic in early 2020 with little relief. She  continues to stay active in her home and notices that household activities increase her pain such as vacuuming and mopping. Medications include Tylenol  (mild to moderate relief).  She was last evaluated on 10/09/2022 at which time she was doing well. She was to continue with heat and home cycling along with physical therapy exercises. She was to continue with tramadol  nightly as needed.  At today's visit she reports that she is experience of increasing pain in the morning in her mid back about the bra line. She would like to return to physical therapy and she states that this was very helpful in managing her pain. We discussed if her pain was to continue consideration would be made for another epidural. She states that she is taking tramadol  at night and doing well with this. Denies any injury. Upon palpation today she is without tenderness to the thoracic paraspinal musculature.   PAIN:  Are you having pain? Yes: NPRS scale: 0/10 Pain location: middle back Pain description: ache Aggravating factors: sleeping Relieving factors: tramadol    PRECAUTIONS: Fall   RED FLAGS: None   WEIGHT BEARING RESTRICTIONS: No  FALLS:  Has patient fallen in last 6 months? No  LIVING ENVIRONMENT: Lives with: lives alone and daughter lives next door. Lives in: House/apartment Stairs: Yes: External: 4 steps; on right going up and on left going up Has following equipment at home:  None, but has use cane in the past. Has shower rails, shower chair; doesn't use  OCCUPATION: retired   PLOF: Independent, Independent with basic ADLs, and Independent with gait  PATIENT GOALS: stronger. Improve mobility   NEXT MD VISIT: unsure   OBJECTIVE:  Note: Objective measures were completed at Evaluation unless otherwise noted.  DIAGNOSTIC FINDINGS:  No recent  imaging   PATIENT SURVEYS:  Modified Oswestry 28%   COGNITION: Overall cognitive status: Within functional limits for tasks assessed     SENSATION: WFL  MUSCLE LENGTH: WFL  POSTURE: rounded shoulders, forward head, and decreased lumbar lordosis  PALPATION: Noted tightness in upper thoracic paraspinal. No pain to palpation   LUMBAR ROM:   AROM eval  Flexion WFL  Extension 20  Right lateral flexion   Left lateral flexion   Right rotation minimal  Left rotation ~10 deg   Cervical  AROM eval  Flexion 45  Extension 18  Right lateral flexion 20  Left lateral flexion 12  Right rotation 50  Left rotation  40  Thoracic  AROM eval  Flexion WFL  Extension 10  Right lateral flexion 25  Left lateral flexion 20  Right rotation minimal  Left rotation ~10 deg      (Blank rows = not tested)  LOWER EXTREMITY ROM:     Grossly WFL  LOWER EXTREMITY MMT:    MMT Right eval Left eval  Hip flexion 4- 4-  Hip extension    Hip abduction 4- 4-  Hip adduction 4 4  Hip internal rotation    Hip external rotation    Knee flexion 4 4  Knee extension 4+ 4+  Ankle dorsiflexion 4 4  Ankle plantarflexion    Ankle inversion    Ankle eversion     (Blank rows = not tested)    FUNCTIONAL TESTS:  5 times sit to stand: 16  Timed up and go (TUG): 12.3 sce 6 minute walk test: 1145ft 10 meter walk test: 0.58m/s Functional gait assessment: 21  4/21: miniBest: 19/28   GAIT: Distance walked: 70 Assistive device utilized: None Level of assistance:  Complete Independence Comments: flexed posture.   TREATMENT DATE: 07/30/2023  Octane reciprocal/activity tolerance training with neurologic priming x 6 min level 3.   3# AW donned.  Reciprocal foot tap on 6inch step x 8 bil  Standing bil shoulder extension with 1 foot on 6 inch step x 8 bil  Side stepping up/down 6inch step x 4 bil. UE supported on rail  Forward step up/over 6inch step x 5 bil. UE on rails  Weighted gait  training with 3# AW 2 x 175ft. Mild foot drag on each bout for last 20 ft.    Standing mid row at Matrix 2 x 7.5# with cues for improved scapula ROM.   CGA for safety or UE supported on rails throughout session   PATIENT EDUCATION:  Education details:exercise technique, large-amplitude training/purpose, techniques for donning jacket, finger flicks Person educated: Patient Education method: IT trainer, VC Education comprehension: verbalized understanding, returned demo  HOME EXERCISE PROGRAM: Access Code: [facility] URL: [email or web address] Date: 07/16/2023 Prepared by: Massie Dollar  Exercises - Step Sideways with Arms Reaching  - 1 x daily - 7 x weekly - 3 sets - 10 reps - 2 hold - Standing Reach to Opposite Side with Weight Shift  - 1 x daily - 7 x weekly - 3 sets - 10 reps - 2 hold - Sit to Stand with Arm Swing  - 1 x daily - 7 x weekly - 3 sets - 10 reps - Seated Reach Forward, Up, and To Sides  - 1 x daily - 7 x weekly - 3 sets - 10 reps - Seated Reaching to Side and Across Body  - 1 x daily - 7 x weekly - 3 sets - 10 reps - Seated Shoulder Row with Anchored Resistance  - 1 x daily - 7 x weekly - 3 sets - 10 reps - Standing March with Counter Support  - 1 x daily - 7 x weekly - 3 sets - 10 reps - Side Stepping with Counter Support  - 1 x daily - 7 x weekly - 3 sets - 10 reps  ASSESSMENT:  CLINICAL IMPRESSION:  Patient presents with good motivation for PT visit today. Mildly increased back pain today, as pt did not take pain meds as prescribed last night. Continued to address balance and postural strength will continue to encourage community fitness with transition to d/c from PT in upcoming weeks.  Pt will benefit from continued skilled PT to address back pain improve strength, increase endurance, and improve overall functional mobility.   OBJECTIVE IMPAIRMENTS: Abnormal gait, cardiopulmonary status limiting activity, decreased activity tolerance, decreased  balance, decreased endurance, decreased mobility, difficulty walking, decreased ROM, decreased strength, hypomobility, increased fascial restrictions, impaired perceived functional ability, impaired flexibility, impaired UE functional use, improper body mechanics, and postural dysfunction.   ACTIVITY LIMITATIONS: carrying, squatting, sleeping, and locomotion level  PARTICIPATION LIMITATIONS: cleaning, laundry, shopping, and community activity  PERSONAL FACTORS: Age, Fitness, and 1-2 comorbidities: lumbar stenosis  are also affecting patient's functional outcome.   REHAB POTENTIAL: Good  CLINICAL DECISION MAKING: Stable/uncomplicated  EVALUATION COMPLEXITY: Low   GOALS: Goals reviewed with patient? Yes   SHORT TERM GOALS: Target date: 06/09/2023    Patient will be independent in home exercise program to improve strength/mobility for better functional independence with ADLs. Baseline: to be given at session 2.  05/07/2023: need to be re-assessed Goal status: INITIAL   LONG TERM GOALS: Target date: 09/29/2023      Patient will improve Mod ODI score by  12.8 points  to demonstrate statistically significant improvement in mobility and quality of life.  Baseline: 28% 3/12: 26% 05/07/2023: 32% indicating moderate disability 5/19/: 24% 07/07/2023= 28% Goal status: IN PROGRESS  2.  Patient (> 70 years old) will complete five times sit to stand test in < 15 seconds indicating an increased LE strength and improved balance. Baseline: 16 sec  3/12: 11.81. sec  05/07/2023: 11.09 seconds 5/19: 10.41 sec no UE support  Goal status: MET   3.  Patient will increase 6 min walk test score by >150 to demonstrated improved access to community and improve safety.   Baseline: 1181ft 3/12: 1253ft 05/07/2023: 1274ft 5/19: 115ft  Goal status: IN PROGRESS  4.  Patient will increase 10 meter walk test to >1.54m/s as to improve gait speed for better community ambulation and to reduce fall  risk. Baseline: 0.52m/s  3/12: 1.77m/s 05/07/2023: 1.098 m/s no AD, independently  Goal status: MET  5.  Patient will reduce timed up and go to <11 seconds to reduce fall risk and demonstrate improved transfer/gait ability. Baseline: 13sec  3/12; 12.5sec  05/07/2023: 11.01 seconds no AD, independently Goal status: MET   6.  Patient will reduced back pain to 3/10 at worst to indicate improved function with household tasks.  Baseline: 9/10 at night.  3/12: 8/10, when waking up in the morning 05/07/2023: continues to report back pain gets as high as 8/10 when bending forward 6/16= no pain presently- still up to 10/10 mid to low back intermittent.  Goal status: IN PROGRESS  7. Patient will increase MiniBest Test score to >18/28 to indicate a reduced risk for falling and demonstrate increased independence with functional mobility and ADLs.  Baseline: 5/28: 15; 07/07/2023= 18  Goal status: PROGRESSING  8.  Patient will increase Functional Gait Assessment (FGA) score to >24/30 as to reduce fall risk and improve dynamic gait safety with community ambulation. Baseline: 5/19: 22; 07/07/2023=22 Goal Status: REVISED PLAN:  PT FREQUENCY: 1x/week  PT DURATION: 12 weeks  PLANNED INTERVENTIONS: 97110-Therapeutic exercises, 97530- Therapeutic activity, 97112- Neuromuscular re-education, 97535- Self Care, [id number]- Manual therapy, 97116- Gait training, 97014- Electrical stimulation (unattended), [phone]- Electrical stimulation (manual), Balance training, Stair training, Taping, Dry Needling, Joint mobilization, Joint manipulation, Spinal manipulation, Spinal mobilization, Scar mobilization, DME instructions, Cryotherapy, and Moist heat.  PLAN FOR NEXT SESSION:   Continued to manage Back pain and PD deficits.  Dynamic gait and posture training.  Encourage community fitness as willing   Massie Dollar PT, DPT  Physical Therapist - [facility]  [facility]  10:21 AM 07/30/23

## 2023-08-04 ENCOUNTER — Encounter

## 2023-08-04 ENCOUNTER — Ambulatory Visit: Payer: Medicare Other | Admitting: Physical Therapy

## 2023-08-06 ENCOUNTER — Encounter

## 2023-08-06 ENCOUNTER — Ambulatory Visit: Payer: Medicare Other | Admitting: Physical Therapy

## 2023-08-11 ENCOUNTER — Encounter

## 2023-08-11 ENCOUNTER — Ambulatory Visit: Payer: Medicare Other | Admitting: Physical Therapy

## 2023-08-13 ENCOUNTER — Ambulatory Visit: Payer: Medicare Other | Admitting: Physical Therapy

## 2023-08-13 ENCOUNTER — Encounter

## 2023-08-13 DIAGNOSIS — R262 Difficulty in walking, not elsewhere classified: Secondary | ICD-10-CM

## 2023-08-13 DIAGNOSIS — G20A1 Parkinson's disease without dyskinesia, without mention of fluctuations: Secondary | ICD-10-CM

## 2023-08-13 DIAGNOSIS — G8929 Other chronic pain: Secondary | ICD-10-CM

## 2023-08-13 DIAGNOSIS — R2689 Other abnormalities of gait and mobility: Secondary | ICD-10-CM

## 2023-08-13 DIAGNOSIS — R278 Other lack of coordination: Secondary | ICD-10-CM

## 2023-08-13 DIAGNOSIS — M6281 Muscle weakness (generalized): Secondary | ICD-10-CM | POA: Diagnosis not present

## 2023-08-13 DIAGNOSIS — R2681 Unsteadiness on feet: Secondary | ICD-10-CM

## 2023-08-13 NOTE — Therapy (Signed)
 OUTPATIENT PHYSICAL THERAPY THORACOLUMBAR TREATMENT     Patient Name: Katelyn Crawford MRN: [id number] DOB:19-May-1940, 83 y.o., female Today's Date: 08/13/2023   END OF SESSION:   PT End of Session - 08/13/23 0955     Visit Number 39    Number of Visits 47    Date for PT Re-Evaluation 09/29/23    Progress Note Due on Visit 40    PT Start Time 1020    PT Stop Time 1056    PT Time Calculation (min) 36 min    Equipment Utilized During Treatment Gait belt    Activity Tolerance Patient tolerated treatment well    Behavior During Therapy WFL for tasks assessed/performed                Past Medical History:  Diagnosis Date   Anemia    Aortic atherosclerosis (HCC)    Arthritis    Cardiomegaly    Cardiomyopathy (HCC)    Cholelithiasis    Chronic kidney disease, stage 2 (mild)    Chronic sinus bradycardia    Coronary artery disease 03/10/2012   a.) LHC 03/10/2012: 40% mLAD, 50% pRI, 50% pRCA, 90% mRCA, 50% dRCA, 50% RPDA - unable to cannulate RCA - med mgmt; b.) LHC/PCI 06/25/2013: 30% pLAD, 40% mLAD, 20% mLCx, 30% RI, 30% pRCA, 70% mRCA (2.5 mm Promus DES), 40% dRCA-1, 90% dRCA-2 (2.5 mm Promus DES)   DDD (degenerative disc disease), thoracolumbar    Diverticulosis    Dizziness    Environmental allergies    Full dentures    GERD (gastroesophageal reflux disease) 06/25/2013   H/O bilateral cataract extraction 2019   History of diverticulosis    History of kidney stones    HLD (hyperlipidemia)    Hyperlipidemia, unspecified    Hypertension    Nephrolithiasis    Osteoporosis, post-menopausal    Parkinson disease (HCC)    Pre-diabetes    PVC's (premature ventricular contractions)    Scoliosis    Thoracic kyphosis    Trifascicular bundle branch block    a.) 1st degree AVB + RBBB + LAFB   Vascular disease    Past Surgical History:  Procedure Laterality Date   APPENDECTOMY     BREAST EXCISIONAL BIOPSY Left 1984   neg   BREAST EXCISIONAL BIOPSY Right 1997   lipoma    CATARACT EXTRACTION W/PHACO Left 09/02/2017   Procedure: CATARACT EXTRACTION PHACO AND INTRAOCULAR LENS PLACEMENT (IOC)  LEFT;  Surgeon: Mittie Gaskin, MD;  Location: [facility];  Service: Ophthalmology;  Laterality: Left;   CATARACT EXTRACTION W/PHACO Right 10/01/2017   Procedure: CATARACT EXTRACTION PHACO AND INTRAOCULAR LENS PLACEMENT (IOC) RIGHT;  Surgeon: Mittie Gaskin, MD;  Location: [facility];  Service: Ophthalmology;  Laterality: Right;   COLONOSCOPY WITH PROPOFOL  N/A 11/27/2016   Procedure: COLONOSCOPY WITH PROPOFOL ;  Surgeon: Toledo, Ladell MARLA, MD;  Location: [facility];  Service: Gastroenterology;  Laterality: N/A;   CORONARY ANGIOPLASTY WITH STENT PLACEMENT Left 06/25/2013   Procedure: CORONARY ANGIOPLASTY WITH STENT PLACEMENT; Location: [facility]; Surgeon: Dayton Dash, MD   KNEE ARTHROPLASTY Left 01/22/2017   Procedure: COMPUTER ASSISTED TOTAL KNEE ARTHROPLASTY;  Surgeon: Mardee Lynwood SQUIBB, MD;  Location: [facility];  Service: Orthopedics;  Laterality: Left;   KNEE ARTHROSCOPY Left 02/01/2015   Procedure: ARTHROSCOPY KNEE, partial medial & lateral menisectomy,,medial and patelofemoral chondroplasty;  Surgeon: Lynwood SQUIBB Mardee, MD;  Location: [facility];  Service: Orthopedics;  Laterality: Left;   LEFT HEART CATH AND CORONARY ANGIOGRAPHY Left 03/10/2012   Procedure:  LEFT HEART CATH AND CORONARY ANGIOGRAPHY; Location: [facility]; Surgeon: Marsa Dooms, MD   LITHOTRIPSY Right 1995   RIGHT AXILLARY LIPOMA REMOVAL     TUBAL LIGATION     VARICOSE VEIN SURGERY     Patient Active Problem List   Diagnosis Date Noted   Primary osteoarthritis of hip 02/09/2017   Primary osteoarthritis of knee 02/09/2017   Chronic sinus bradycardia 01/22/2017   Degenerative disc disease, lumbar 01/22/2017   Dizziness 01/22/2017   Nephrolithiasis 01/22/2017   Osteoporosis, 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/22/2017   PVC's (premature ventricular contractions) 01/22/2017   S/P total knee  arthroplasty 01/22/2017   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   CKD (chronic kidney disease), 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   GERD (gastroesophageal reflux disease) 06/25/2013   Coronary artery disease involving native coronary artery of native heart 06/24/2013   Essential hypertension 06/21/2013   Mixed hyperlipidemia 06/21/2013    PCP: Auston Reyes BIRCH, MD   REFERRING PROVIDER: Meeler, Benton CROME, FNP  REFERRING DIAG:  Diagnosis  [phone] (ICD-10-CM) - Lumbar stenosis with neurogenic claudication  M54.14 (ICD-10-CM) - Thoracic radiculitis    Rationale for Evaluation and Treatment: Rehabilitation  THERAPY DIAG:  Unsteadiness on feet  Other abnormalities of gait and mobility  Difficulty in walking, not elsewhere classified  Chronic bilateral thoracic back pain  Other lack of coordination  Parkinson's disease, unspecified whether dyskinesia present, unspecified whether manifestations fluctuate (HCC)  Muscle weakness (generalized)  ONSET DATE: >3 months   SUBJECTIVE:                                                                                                                                                                                           SUBJECTIVE STATEMENT:  Pt states that she has been out of town for family vacation. Reports that she has active every day play ping pong, corn hole and [place]. States that she has lowered pain medicine to half, and will start to take them every other day.  Also states that she has been sleeping much better over the last 2 weeks.   PERTINENT HISTORY:   Hx of PD and familiar to this clinic for PD management.  From recent MD appointment:  The patient is a pleasant 83 year old female who presents today for acute on chronic bilateral low back pain without radiation to the lower extremities. She describes pain that began June 2020 without injury. She also states that she has had some form of back pain for many  years. She describes her pain as mild to moderate that is sharp and intermittent. Bending and lifting increases her pain. Rest can help alleviate her pain.  She participated in physical therapy at [facility] in early 2020 with little relief. She continues to stay active in her home and notices that household activities increase her pain such as vacuuming and mopping. Medications include Tylenol  (mild to moderate relief).  She was last evaluated on 10/09/2022 at which time she was doing well. She was to continue with heat and home cycling along with physical therapy exercises. She was to continue with tramadol  nightly as needed.  At today's visit she reports that she is experience of increasing pain in the morning in her mid back about the bra line. She would like to return to physical therapy and she states that this was very helpful in managing her pain. We discussed if her pain was to continue consideration would be made for another epidural. She states that she is taking tramadol  at night and doing well with this. Denies any injury. Upon palpation today she is without tenderness to the thoracic paraspinal musculature.   PAIN:  Are you having pain? Yes: NPRS scale: 0/10 Pain location: middle back Pain description: ache Aggravating factors: sleeping Relieving factors: tramadol    PRECAUTIONS: Fall   RED FLAGS: None   WEIGHT BEARING RESTRICTIONS: No  FALLS:  Has patient fallen in last 6 months? No  LIVING ENVIRONMENT: Lives with: lives alone and daughter lives next door. Lives in: House/apartment Stairs: Yes: External: 4 steps; on right going up and on left going up Has following equipment at home:  None, but has use cane in the past. Has shower rails, shower chair; doesn't use  OCCUPATION: retired   PLOF: Independent, Independent with basic ADLs, and Independent with gait  PATIENT GOALS: stronger. Improve mobility   NEXT MD VISIT: unsure   OBJECTIVE:  Note: Objective  measures were completed at Evaluation unless otherwise noted.  DIAGNOSTIC FINDINGS:  No recent imaging   PATIENT SURVEYS:  Modified Oswestry 28%   COGNITION: Overall cognitive status: Within functional limits for tasks assessed     SENSATION: WFL  MUSCLE LENGTH: WFL  POSTURE: rounded shoulders, forward head, and decreased lumbar lordosis  PALPATION: Noted tightness in upper thoracic paraspinal. No pain to palpation   LUMBAR ROM:   AROM eval  Flexion WFL  Extension 20  Right lateral flexion   Left lateral flexion   Right rotation minimal  Left rotation ~10 deg   Cervical  AROM eval  Flexion 45  Extension 18  Right lateral flexion 20  Left lateral flexion 12  Right rotation 50  Left rotation  40  Thoracic  AROM eval  Flexion WFL  Extension 10  Right lateral flexion 25  Left lateral flexion 20  Right rotation minimal  Left rotation ~10 deg      (Blank rows = not tested)  LOWER EXTREMITY ROM:     Grossly WFL  LOWER EXTREMITY MMT:    MMT Right eval Left eval  Hip flexion 4- 4-  Hip extension    Hip abduction 4- 4-  Hip adduction 4 4  Hip internal rotation    Hip external rotation    Knee flexion 4 4  Knee extension 4+ 4+  Ankle dorsiflexion 4 4  Ankle plantarflexion    Ankle inversion    Ankle eversion     (Blank rows = not tested)    FUNCTIONAL TESTS:  5 times sit to stand: 16  Timed up and go (TUG): 12.3 sce 6 minute walk test: 1155ft 10 meter walk test: 0.9m/s Functional gait assessment: 21  4/21:  miniBest: 19/28   GAIT: Distance walked: 70 Assistive device utilized: None Level of assistance: Complete Independence Comments: flexed posture.   TREATMENT DATE: 08/13/2023  Nustep reciprocal/activity tolerance training with neurologic priming x 6 min level 3.  Sit<>stand with UE swing into extension x 10  Seated trunk rotation to clap contralateral hand in horizontal abduction x 12  Trunkal lateral flexion with BUE in abduction  of 90 deg x 10  Standing shoulder extension RTB x 12 Standing high row x 12 RTB  Lateral step with lateral reach x 10 bil  Seated trunk extension and scapula retraction with half bolster along spine. CGA-supervision assist  for safety provided pt PT with gait belt in place throughout session    Pt reports need for urination and need to leave early for prior engagement. PT treatment cut a little short .     PATIENT EDUCATION:  Education details:exercise technique, large-amplitude training/purpose, techniques for donning jacket, finger flicks Person educated: Patient Education method: IT trainer, VC Education comprehension: verbalized understanding, returned demo  HOME EXERCISE PROGRAM: Access Code: [facility] URL: [email or web address] Date: 07/16/2023 Prepared by: Massie Dollar  Exercises - Step Sideways with Arms Reaching  - 1 x daily - 7 x weekly - 3 sets - 10 reps - 2 hold - Standing Reach to Opposite Side with Weight Shift  - 1 x daily - 7 x weekly - 3 sets - 10 reps - 2 hold - Sit to Stand with Arm Swing  - 1 x daily - 7 x weekly - 3 sets - 10 reps - Seated Reach Forward, Up, and To Sides  - 1 x daily - 7 x weekly - 3 sets - 10 reps - Seated Reaching to Side and Across Body  - 1 x daily - 7 x weekly - 3 sets - 10 reps - Seated Shoulder Row with Anchored Resistance  - 1 x daily - 7 x weekly - 3 sets - 10 reps - Standing March with Counter Support  - 1 x daily - 7 x weekly - 3 sets - 10 reps - Side Stepping with Counter Support  - 1 x daily - 7 x weekly - 3 sets - 10 reps  ASSESSMENT:  CLINICAL IMPRESSION:  Patient presents with good motivation for PT visit today, but session cut a little short due to restroom break and need to leave a little early . Returning from family vacation reports no pain, and better sleep over the last week. Continued to address reduced strength and ROM in mid back along with large amplitude deficits from PD. Due to reduced pain and  improved function PT treatment will cease after next schedule appointment.    Pt will benefit from continued skilled PT to address back pain improve strength, increase endurance, and improve overall functional mobility.   OBJECTIVE IMPAIRMENTS: Abnormal gait, cardiopulmonary status limiting activity, decreased activity tolerance, decreased balance, decreased endurance, decreased mobility, difficulty walking, decreased ROM, decreased strength, hypomobility, increased fascial restrictions, impaired perceived functional ability, impaired flexibility, impaired UE functional use, improper body mechanics, and postural dysfunction.   ACTIVITY LIMITATIONS: carrying, squatting, sleeping, and locomotion level  PARTICIPATION LIMITATIONS: cleaning, laundry, shopping, and community activity  PERSONAL FACTORS: Age, Fitness, and 1-2 comorbidities: lumbar stenosis  are also affecting patient's functional outcome.   REHAB POTENTIAL: Good  CLINICAL DECISION MAKING: Stable/uncomplicated  EVALUATION COMPLEXITY: Low   GOALS: Goals reviewed with patient? Yes   SHORT TERM GOALS: Target date: 06/09/2023    Patient will be independent in  home exercise program to improve strength/mobility for better functional independence with ADLs. Baseline: to be given at session 2.  05/07/2023: need to be re-assessed Goal status: INITIAL   LONG TERM GOALS: Target date: 09/29/2023      Patient will improve Mod ODI score by 12.8 points  to demonstrate statistically significant improvement in mobility and quality of life.  Baseline: 28% 3/12: 26% 05/07/2023: 32% indicating moderate disability 5/19/: 24% 07/07/2023= 28% Goal status: IN PROGRESS  2.  Patient (> 68 years old) will complete five times sit to stand test in < 15 seconds indicating an increased LE strength and improved balance. Baseline: 16 sec  3/12: 11.81. sec  05/07/2023: 11.09 seconds 5/19: 10.41 sec no UE support  Goal status: MET   3.  Patient  will increase 6 min walk test score by >150 to demonstrated improved access to community and improve safety.   Baseline: 1170ft 3/12: 1244ft 05/07/2023: 1266ft 5/19: 1137ft  Goal status: IN PROGRESS  4.  Patient will increase 10 meter walk test to >1.71m/s as to improve gait speed for better community ambulation and to reduce fall risk. Baseline: 0.33m/s  3/12: 1.106m/s 05/07/2023: 1.098 m/s no AD, independently  Goal status: MET  5.  Patient will reduce timed up and go to <11 seconds to reduce fall risk and demonstrate improved transfer/gait ability. Baseline: 13sec  3/12; 12.5sec  05/07/2023: 11.01 seconds no AD, independently Goal status: MET   6.  Patient will reduced back pain to 3/10 at worst to indicate improved function with household tasks.  Baseline: 9/10 at night.  3/12: 8/10, when waking up in the morning 05/07/2023: continues to report back pain gets as high as 8/10 when bending forward 6/16= no pain presently- still up to 10/10 mid to low back intermittent.  Goal status: IN PROGRESS  7. Patient will increase MiniBest Test score to >18/28 to indicate a reduced risk for falling and demonstrate increased independence with functional mobility and ADLs.  Baseline: 5/28: 15; 07/07/2023= 18  Goal status: PROGRESSING  8.  Patient will increase Functional Gait Assessment (FGA) score to >24/30 as to reduce fall risk and improve dynamic gait safety with community ambulation. Baseline: 5/19: 22; 07/07/2023=22 Goal Status: REVISED PLAN:  PT FREQUENCY: 1x/week  PT DURATION: 12 weeks  PLANNED INTERVENTIONS: 97110-Therapeutic exercises, 97530- Therapeutic activity, 97112- Neuromuscular re-education, 97535- Self Care, [id number]- Manual therapy, 97116- Gait training, 97014- Electrical stimulation (unattended), [phone]- Electrical stimulation (manual), Balance training, Stair training, Taping, Dry Needling, Joint mobilization, Joint manipulation, Spinal manipulation, Spinal mobilization, Scar  mobilization, DME instructions, Cryotherapy, and Moist heat.  PLAN FOR NEXT SESSION:   Progress note and d/c assessment   Massie Dollar PT, DPT  Physical Therapist - [facility]  [facility]  11:06 AM 08/13/23

## 2023-08-18 ENCOUNTER — Encounter

## 2023-08-18 ENCOUNTER — Ambulatory Visit: Payer: Medicare Other | Admitting: Physical Therapy

## 2023-08-20 ENCOUNTER — Encounter

## 2023-08-20 ENCOUNTER — Ambulatory Visit: Payer: Medicare Other | Admitting: Physical Therapy

## 2023-08-20 DIAGNOSIS — G20A1 Parkinson's disease without dyskinesia, without mention of fluctuations: Secondary | ICD-10-CM

## 2023-08-20 DIAGNOSIS — R2681 Unsteadiness on feet: Secondary | ICD-10-CM

## 2023-08-20 DIAGNOSIS — M6281 Muscle weakness (generalized): Secondary | ICD-10-CM

## 2023-08-20 DIAGNOSIS — R262 Difficulty in walking, not elsewhere classified: Secondary | ICD-10-CM

## 2023-08-20 DIAGNOSIS — R2689 Other abnormalities of gait and mobility: Secondary | ICD-10-CM

## 2023-08-20 DIAGNOSIS — R278 Other lack of coordination: Secondary | ICD-10-CM

## 2023-08-20 DIAGNOSIS — G8929 Other chronic pain: Secondary | ICD-10-CM

## 2023-08-20 NOTE — Therapy (Signed)
 OUTPATIENT PHYSICAL THERAPY THORACOLUMBAR TREATMENT/  DC assessment.  PHYSICAL THERAPY PROGRESS NOTE   Dates of reporting period  06/11/23   to   08/20/2023       Patient Name: Katelyn Crawford MRN: [id number] DOB:03-26-1940, 83 y.o., female Today's Date: 08/20/2023   END OF SESSION:   PT End of Session - 08/20/23 1021     Visit Number 40    Number of Visits 47    Date for PT Re-Evaluation 09/29/23    Progress Note Due on Visit 40    PT Start Time 1019    PT Stop Time 1100    PT Time Calculation (min) 41 min    Equipment Utilized During Treatment Gait belt    Activity Tolerance Patient tolerated treatment well    Behavior During Therapy WFL for tasks assessed/performed                Past Medical History:  Diagnosis Date   Anemia    Aortic atherosclerosis (HCC)    Arthritis    Cardiomegaly    Cardiomyopathy (HCC)    Cholelithiasis    Chronic kidney disease, stage 2 (mild)    Chronic sinus bradycardia    Coronary artery disease 03/10/2012   a.) LHC 03/10/2012: 40% mLAD, 50% pRI, 50% pRCA, 90% mRCA, 50% dRCA, 50% RPDA - unable to cannulate RCA - med mgmt; b.) LHC/PCI 06/25/2013: 30% pLAD, 40% mLAD, 20% mLCx, 30% RI, 30% pRCA, 70% mRCA (2.5 mm Promus DES), 40% dRCA-1, 90% dRCA-2 (2.5 mm Promus DES)   DDD (degenerative disc disease), thoracolumbar    Diverticulosis    Dizziness    Environmental allergies    Full dentures    GERD (gastroesophageal reflux disease) 06/25/2013   H/O bilateral cataract extraction 2019   History of diverticulosis    History of kidney stones    HLD (hyperlipidemia)    Hyperlipidemia, unspecified    Hypertension    Nephrolithiasis    Osteoporosis, post-menopausal    Parkinson disease (HCC)    Pre-diabetes    PVC's (premature ventricular contractions)    Scoliosis    Thoracic kyphosis    Trifascicular bundle branch block    a.) 1st degree AVB + RBBB + LAFB   Vascular disease    Past Surgical History:  Procedure Laterality Date    APPENDECTOMY     BREAST EXCISIONAL BIOPSY Left 1984   neg   BREAST EXCISIONAL BIOPSY Right 1997   lipoma   CATARACT EXTRACTION W/PHACO Left 09/02/2017   Procedure: CATARACT EXTRACTION PHACO AND INTRAOCULAR LENS PLACEMENT (IOC)  LEFT;  Surgeon: Mittie Gaskin, MD;  Location: [facility];  Service: Ophthalmology;  Laterality: Left;   CATARACT EXTRACTION W/PHACO Right 10/01/2017   Procedure: CATARACT EXTRACTION PHACO AND INTRAOCULAR LENS PLACEMENT (IOC) RIGHT;  Surgeon: Mittie Gaskin, MD;  Location: [facility];  Service: Ophthalmology;  Laterality: Right;   COLONOSCOPY WITH PROPOFOL  N/A 11/27/2016   Procedure: COLONOSCOPY WITH PROPOFOL ;  Surgeon: Toledo, Ladell MARLA, MD;  Location: [facility];  Service: Gastroenterology;  Laterality: N/A;   CORONARY ANGIOPLASTY WITH STENT PLACEMENT Left 06/25/2013   Procedure: CORONARY ANGIOPLASTY WITH STENT PLACEMENT; Location: [facility]; Surgeon: Dayton Dash, MD   KNEE ARTHROPLASTY Left 01/22/2017   Procedure: COMPUTER ASSISTED TOTAL KNEE ARTHROPLASTY;  Surgeon: Mardee Lynwood SQUIBB, MD;  Location: [facility];  Service: Orthopedics;  Laterality: Left;   KNEE ARTHROSCOPY Left 02/01/2015   Procedure: ARTHROSCOPY KNEE, partial medial & lateral menisectomy,,medial and patelofemoral chondroplasty;  Surgeon: Lynwood SQUIBB Mardee,  MD;  Location: [facility];  Service: Orthopedics;  Laterality: Left;   LEFT HEART CATH AND CORONARY ANGIOGRAPHY Left 03/10/2012   Procedure: LEFT HEART CATH AND CORONARY ANGIOGRAPHY; Location: [facility]; Surgeon: Marsa Dooms, MD   LITHOTRIPSY Right 1995   RIGHT AXILLARY LIPOMA REMOVAL     TUBAL LIGATION     VARICOSE VEIN SURGERY     Patient Active Problem List   Diagnosis Date Noted   Primary osteoarthritis of hip 02/09/2017   Primary osteoarthritis of knee 02/09/2017   Chronic sinus bradycardia 01/22/2017   Degenerative disc disease, lumbar 01/22/2017   Dizziness 01/22/2017   Nephrolithiasis 01/22/2017    Osteoporosis, 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/22/2017   PVC's (premature ventricular contractions) 01/22/2017   S/P total knee arthroplasty 01/22/2017   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   CKD (chronic kidney disease), 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   GERD (gastroesophageal reflux disease) 06/25/2013   Coronary artery disease involving native coronary artery of native heart 06/24/2013   Essential hypertension 06/21/2013   Mixed hyperlipidemia 06/21/2013    PCP: Auston Reyes BIRCH, MD   REFERRING PROVIDER: Meeler, Benton CROME, FNP  REFERRING DIAG:  Diagnosis  [phone] (ICD-10-CM) - Lumbar stenosis with neurogenic claudication  M54.14 (ICD-10-CM) - Thoracic radiculitis    Rationale for Evaluation and Treatment: Rehabilitation  THERAPY DIAG:  Unsteadiness on feet  Other abnormalities of gait and mobility  Difficulty in walking, not elsewhere classified  Chronic bilateral thoracic back pain  Other lack of coordination  Parkinson's disease, unspecified whether dyskinesia present, unspecified whether manifestations fluctuate (HCC)  Muscle weakness (generalized)  ONSET DATE: >3 months   SUBJECTIVE:                                                                                                                                                                                           SUBJECTIVE STATEMENT:   Pt states she is doing well on this day. States that she was having a rough time on Saturday and Sunday after doing a lot on Friday.  States that she rested for most of the Day Sunday, and has felt better since Monday afternoon.  Non Pain reported, but states that she just feels like she can't do anything at times.    PERTINENT HISTORY:   Hx of PD and familiar to this clinic for PD management.  From recent MD appointment:  The patient is a pleasant 83 year old female who presents today for acute on chronic bilateral low back pain without radiation to the lower extremities. She  describes pain that began June 2020 without injury. She also states that she has had some form of back pain for many years.  She describes her pain as mild to moderate that is sharp and intermittent. Bending and lifting increases her pain. Rest can help alleviate her pain. She participated in physical therapy at [facility] clinic in early 2020 with little relief. She continues to stay active in her home and notices that household activities increase her pain such as vacuuming and mopping. Medications include Tylenol  (mild to moderate relief).  She was last evaluated on 10/09/2022 at which time she was doing well. She was to continue with heat and home cycling along with physical therapy exercises. She was to continue with tramadol  nightly as needed.  At today's visit she reports that she is experience of increasing pain in the morning in her mid back about the bra line. She would like to return to physical therapy and she states that this was very helpful in managing her pain. We discussed if her pain was to continue consideration would be made for another epidural. She states that she is taking tramadol  at night and doing well with this. Denies any injury. Upon palpation today she is without tenderness to the thoracic paraspinal musculature.   PAIN:  Are you having pain? Yes: NPRS scale: 0/10 Pain location: middle back Pain description: ache Aggravating factors: sleeping Relieving factors: tramadol    PRECAUTIONS: Fall   RED FLAGS: None   WEIGHT BEARING RESTRICTIONS: No  FALLS:  Has patient fallen in last 6 months? No  LIVING ENVIRONMENT: Lives with: lives alone and daughter lives next door. Lives in: House/apartment Stairs: Yes: External: 4 steps; on right going up and on left going up Has following equipment at home:  None, but has use cane in the past. Has shower rails, shower chair; doesn't use  OCCUPATION: retired   PLOF: Independent, Independent with basic ADLs, and Independent with  gait  PATIENT GOALS: stronger. Improve mobility   NEXT MD VISIT: unsure   OBJECTIVE:  Note: Objective measures were completed at Evaluation unless otherwise noted.  DIAGNOSTIC FINDINGS:  No recent imaging   PATIENT SURVEYS:  Modified Oswestry 28%   COGNITION: Overall cognitive status: Within functional limits for tasks assessed     SENSATION: WFL  MUSCLE LENGTH: WFL  POSTURE: rounded shoulders, forward head, and decreased lumbar lordosis  PALPATION: Noted tightness in upper thoracic paraspinal. No pain to palpation   LUMBAR ROM:   AROM eval  Flexion WFL  Extension 20  Right lateral flexion   Left lateral flexion   Right rotation minimal  Left rotation ~10 deg   Cervical  AROM eval  Flexion 45  Extension 18  Right lateral flexion 20  Left lateral flexion 12  Right rotation 50  Left rotation  40  Thoracic  AROM eval  Flexion WFL  Extension 10  Right lateral flexion 25  Left lateral flexion 20  Right rotation minimal  Left rotation ~10 deg      (Blank rows = not tested)  LOWER EXTREMITY ROM:     Grossly WFL  LOWER EXTREMITY MMT:    MMT Right eval Left eval  Hip flexion 4- 4-  Hip extension    Hip abduction 4- 4-  Hip adduction 4 4  Hip internal rotation    Hip external rotation    Knee flexion 4 4  Knee extension 4+ 4+  Ankle dorsiflexion 4 4  Ankle plantarflexion    Ankle inversion    Ankle eversion     (Blank rows = not tested)    FUNCTIONAL TESTS:  5 times sit to stand:  16  Timed up and go (TUG): 12.3 sce 6 minute walk test: 1142ft 10 meter walk test: 0.60m/s Functional gait assessment: 21  4/21: miniBest: 19/28   GAIT: Distance walked: 70 Assistive device utilized: None Level of assistance: Complete Independence Comments: flexed posture.   TREATMENT DATE: 08/20/2023   Octane x 4 min for neurologic priming BLE only.   PT instructed pt in TUG: 10.95  (average of 2 trials(11.51 sec, 10.4sec); >13.5 sec indicates  increased fall risk)  6 Min Walk Test:  Instructed patient to ambulate as quickly and as safely as possible for 6 minutes using LRAD. Patient was allowed to take standing rest breaks without stopping the test, but if the patient required a sitting rest break the clock would be stopped and the test would be over.  Results: 1200 feet (365 meters, Avg speed 1.54m/s) using no AD with supervision assist. Moderate SOB upon completion  Results indicate that the patient has reduced endurance with ambulation compared to age matched norms.  Age Matched Norms: 25-69 yo M: 28 F: 47, 65-79 yo M: 75 F: 471, 54-89 yo M: 417 F: 392 MDC: 58.21 meters (190.98 feet) or 50 meters (ANPTA Core Set of Outcome Measures for Adults with Neurologic Conditions, 2018)  10 Meter Walk Test: Patient instructed to walk 10 meters (32.8 ft) as quickly and as safely as possible at their normal speed x2 and at a fast speed x2. Time measured from 2 meter mark to 8 meter mark to accommodate ramp-up and ramp-down.   Average Normal speed: 1.01 m/s  Cut off scores: <0.4 m/s = household Ambulator, 0.4-0.8 m/s = limited community Ambulator, >0.8 m/s = community Ambulator, >1.2 m/s = crossing a street, <1.0 = increased fall risk MCID 0.05 m/s (small), 0.13 m/s (moderate), 0.06 m/s (significant)  (ANPTA Core Set of Outcome Measures for Adults with Neurologic Conditions, 2018)   [facility] PT Assessment - 08/20/23 0001       Mini-BESTest   Sit To Stand Normal: Comes to stand without use of hands and stabilizes independently.    Rise to Toes Normal: Stable for 3 s with maximum height.    Stand on one leg (left) Moderate: < 20 s    Stand on one leg (right) Moderate: < 20 s    Stand on one leg - lowest score 1    Compensatory Stepping Correction - Forward Normal: Recovers independently with a single, large step (second realignement is allowed).    Compensatory Stepping Correction - Backward Moderate: More than one step is required to  recover equilibrium    Compensatory Stepping Correction - Left Lateral Moderate: Several steps to recover equilibrium    Compensatory Stepping Correction - Right Lateral Normal: Recovers independently with 1 step (crossover or lateral OK)    Stepping Corredtion Lateral - lowest score 1    Stance - Feet together, eyes open, firm surface  Normal: 30s    Stance - Feet together, eyes closed, foam surface  Normal: 30s    Incline - Eyes Closed Moderate: Stands independently < 30s OR aligns with surface    Change in Gait Speed Normal: Significantly changes walkling speed without imbalance    Walk with head turns - Horizontal Normal: performs head turns with no change in gait speed and good balance    Walk with pivot turns Normal: Turns with feet close FAST (< 3 steps) with good balance.    Step over obstacles Moderate: Steps over box but touches box OR displays cautious behavior  by slowing gait.    Timed UP & GO with Dual Task Moderate: Dual Task affects either counting OR walking (>10%) when compared to the TUG without Dual Task.    Mini-BEST total score 22      Functional Gait  Assessment   Gait Level Surface Walks 20 ft in less than 5.5 sec, no assistive devices, good speed, no evidence for imbalance, normal gait pattern, deviates no more than 6 in outside of the 12 in walkway width.    Change in Gait Speed Able to smoothly change walking speed without loss of balance or gait deviation. Deviate no more than 6 in outside of the 12 in walkway width.    Gait with Horizontal Head Turns Performs head turns smoothly with no change in gait. Deviates no more than 6 in outside 12 in walkway width    Gait with Vertical Head Turns Performs head turns with no change in gait. Deviates no more than 6 in outside 12 in walkway width.    Gait and Pivot Turn Pivot turns safely within 3 sec and stops quickly with no loss of balance.    Step Over Obstacle Is able to step over one shoe box (4.5 in total height) but must  slow down and adjust steps to clear box safely. May require verbal cueing.    Gait with Narrow Base of Support Ambulates 4-7 steps.    Gait with Eyes Closed Walks 20 ft, no assistive devices, good speed, no evidence of imbalance, normal gait pattern, deviates no more than 6 in outside 12 in walkway width. Ambulates 20 ft in less than 7 sec.    Ambulating Backwards Walks 20 ft, uses assistive device, slower speed, mild gait deviations, deviates 6-10 in outside 12 in walkway width.    Steps Alternating feet, must use rail.    Total Score 24         Patient demonstrates increased fall risk as noted by score of 24/30 on  Functional Gait Assessment.   <22/30 = predictive of falls, <20/30 = fall in 6 months, <18/30 = predictive of falls in PD MCID: 5 points stroke population, 4 points geriatric population (ANPTA Core Set of Outcome Measures for Adults with Neurologic Conditions, 2018)   PATIENT EDUCATION:  Education details:exercise technique, large-amplitude training/purpose, techniques for donning jacket, finger flicks Person educated: Patient Education method: IT trainer, VC Education comprehension: verbalized understanding, returned demo  HOME EXERCISE PROGRAM: Access Code: [facility] URL: [email or web address] Date: 07/16/2023 Prepared by: Massie Dollar  Exercises - Step Sideways with Arms Reaching  - 1 x daily - 7 x weekly - 3 sets - 10 reps - 2 hold - Standing Reach to Opposite Side with Weight Shift  - 1 x daily - 7 x weekly - 3 sets - 10 reps - 2 hold - Sit to Stand with Arm Swing  - 1 x daily - 7 x weekly - 3 sets - 10 reps - Seated Reach Forward, Up, and To Sides  - 1 x daily - 7 x weekly - 3 sets - 10 reps - Seated Reaching to Side and Across Body  - 1 x daily - 7 x weekly - 3 sets - 10 reps - Seated Shoulder Row with Anchored Resistance  - 1 x daily - 7 x weekly - 3 sets - 10 reps - Standing March with Counter Support  - 1 x daily - 7 x weekly - 3 sets - 10  reps - Side Stepping with Counter Support  -  1 x daily - 7 x weekly - 3 sets - 10 reps  ASSESSMENT:  CLINICAL IMPRESSION:  Patient presents with good motivation for PT visit today. PT instructed pt in goal assessment for progress note. Pt continues to reports less back pain and less intensity when present. Has met 5 of 8 LTG with improved balance noted with increased FGA and Mini BEST.  Was able to sustain gait speed and 6 min walk test. Due to improved back pain, and balance with standardized assessments, pt will no longer benefit from PT and d/c from skilled PT at this time. Pt was provided with extensive HEP to manage back pain and PD deficits at prior visit.     OBJECTIVE IMPAIRMENTS: Abnormal gait, cardiopulmonary status limiting activity, decreased activity tolerance, decreased balance, decreased endurance, decreased mobility, difficulty walking, decreased ROM, decreased strength, hypomobility, increased fascial restrictions, impaired perceived functional ability, impaired flexibility, impaired UE functional use, improper body mechanics, and postural dysfunction.   ACTIVITY LIMITATIONS: carrying, squatting, sleeping, and locomotion level  PARTICIPATION LIMITATIONS: cleaning, laundry, shopping, and community activity  PERSONAL FACTORS: Age, Fitness, and 1-2 comorbidities: lumbar stenosis  are also affecting patient's functional outcome.   REHAB POTENTIAL: Good  CLINICAL DECISION MAKING: Stable/uncomplicated  EVALUATION COMPLEXITY: Low   GOALS: Goals reviewed with patient? Yes   SHORT TERM GOALS: Target date: 06/09/2023    Patient will be independent in home exercise program to improve strength/mobility for better functional independence with ADLs. Baseline: to be given at session 2.  05/07/2023: need to be re-assessed Goal status: INITIAL   LONG TERM GOALS: Target date: 09/29/2023      Patient will improve Mod ODI score by 12.8 points  to demonstrate statistically  significant improvement in mobility and quality of life.  Baseline: 28% 3/12: 26% 05/07/2023: 32% indicating moderate disability 5/19/: 24% 07/07/2023= 28% Goal status: IN PROGRESS  2.  Patient (> 100 years old) will complete five times sit to stand test in < 15 seconds indicating an increased LE strength and improved balance. Baseline: 16 sec  3/12: 11.81. sec  05/07/2023: 11.09 seconds 5/19: 10.41 sec no UE support  Goal status: MET   3.  Patient will increase 6 min walk test score by >150 to demonstrated improved access to community and improve safety.   Baseline: 1147ft 3/12: 1254ft 05/07/2023: 1257ft 5/19: 1145ft  7/30: 1265ft  Goal status: IN PROGRESS  4.  Patient will increase 10 meter walk test to >1.46m/s as to improve gait speed for better community ambulation and to reduce fall risk. Baseline: 0.47m/s  3/12: 1.7m/s 05/07/2023: 1.098 m/s no AD, independently  7/30: 1.8m/s average on 6 min walk test.  Goal status: MET  5.  Patient will reduce timed up and go to <11 seconds to reduce fall risk and demonstrate improved transfer/gait ability. Baseline: 13sec  3/12; 12.5sec  05/07/2023: 11.01 seconds no AD, independently 7/30: 10.95 sec  Goal status: MET   6.  Patient will reduced back pain to 3/10 at worst to indicate improved function with household tasks.  Baseline: 9/10 at night.  3/12: 8/10, when waking up in the morning 05/07/2023: continues to report back pain gets as high as 8/10 when bending forward 6/16= no pain presently- still up to 10/10 mid to low back intermittent.  7/30: no pain at time of PT treatment. 6/10 at worst.  Goal status: IN PROGRESS  7. Patient will increase MiniBest Test score to >18/28 to indicate a reduced risk for falling and demonstrate increased independence  with functional mobility and ADLs.  Baseline: 5/28: 15; 07/07/2023= 18  7/30: 22    Goal status: MET  8.  Patient will increase Functional Gait Assessment (FGA) score to >24/30 as  to reduce fall risk and improve dynamic gait safety with community ambulation. Baseline: 5/19: 22; 07/07/2023=22 7/30: 24  Goal Status: MET  PLAN:  PT FREQUENCY: 1x/week  PT DURATION: 12 weeks  PLANNED INTERVENTIONS: 97110-Therapeutic exercises, 97530- Therapeutic activity, 97112- Neuromuscular re-education, 97535- Self Care, [id number]- Manual therapy, 97116- Gait training, 97014- Electrical stimulation (unattended), [phone]- Electrical stimulation (manual), Balance training, Stair training, Taping, Dry Needling, Joint mobilization, Joint manipulation, Spinal manipulation, Spinal mobilization, Scar mobilization, DME instructions, Cryotherapy, and Moist heat.  PLAN FOR NEXT SESSION:   N/a  Massie Dollar PT, DPT  Physical Therapist - [facility]  [facility]  10:22 AM 08/20/23

## 2023-08-25 ENCOUNTER — Ambulatory Visit: Payer: Medicare Other | Admitting: Physical Therapy

## 2023-08-25 ENCOUNTER — Encounter

## 2023-08-27 ENCOUNTER — Encounter

## 2023-08-27 ENCOUNTER — Ambulatory Visit: Payer: Medicare Other | Admitting: Physical Therapy

## 2023-08-30 ENCOUNTER — Observation Stay

## 2023-08-30 ENCOUNTER — Inpatient Hospital Stay
Admission: EM | Admit: 2023-08-30 | Discharge: 2023-09-02 | DRG: 641 | Disposition: A | Discharge status: Home / Self Care | Attending: Family Medicine | Admitting: Family Medicine

## 2023-08-30 ENCOUNTER — Other Ambulatory Visit: Payer: Self-pay

## 2023-08-30 ENCOUNTER — Emergency Department

## 2023-08-30 ENCOUNTER — Encounter: Payer: Self-pay | Admitting: Emergency Medicine

## 2023-08-30 DIAGNOSIS — N182 Chronic kidney disease, stage 2 (mild): Secondary | ICD-10-CM | POA: Diagnosis present

## 2023-08-30 DIAGNOSIS — Z8249 Family history of ischemic heart disease and other diseases of the circulatory system: Secondary | ICD-10-CM

## 2023-08-30 DIAGNOSIS — I10 Essential (primary) hypertension: Secondary | ICD-10-CM | POA: Diagnosis present

## 2023-08-30 DIAGNOSIS — Z880 Allergy status to penicillin: Secondary | ICD-10-CM

## 2023-08-30 DIAGNOSIS — Z7982 Long term (current) use of aspirin: Secondary | ICD-10-CM

## 2023-08-30 DIAGNOSIS — E871 Hypo-osmolality and hyponatremia: Secondary | ICD-10-CM | POA: Diagnosis not present

## 2023-08-30 DIAGNOSIS — E785 Hyperlipidemia, unspecified: Secondary | ICD-10-CM | POA: Diagnosis present

## 2023-08-30 DIAGNOSIS — I160 Hypertensive urgency: Secondary | ICD-10-CM | POA: Diagnosis not present

## 2023-08-30 DIAGNOSIS — G8929 Other chronic pain: Secondary | ICD-10-CM | POA: Diagnosis present

## 2023-08-30 DIAGNOSIS — R519 Headache, unspecified: Secondary | ICD-10-CM

## 2023-08-30 DIAGNOSIS — Z79899 Other long term (current) drug therapy: Secondary | ICD-10-CM

## 2023-08-30 DIAGNOSIS — Z87442 Personal history of urinary calculi: Secondary | ICD-10-CM

## 2023-08-30 DIAGNOSIS — I2510 Atherosclerotic heart disease of native coronary artery without angina pectoris: Secondary | ICD-10-CM | POA: Diagnosis present

## 2023-08-30 DIAGNOSIS — Z882 Allergy status to sulfonamides status: Secondary | ICD-10-CM

## 2023-08-30 DIAGNOSIS — M549 Dorsalgia, unspecified: Secondary | ICD-10-CM | POA: Diagnosis present

## 2023-08-30 DIAGNOSIS — R110 Nausea: Secondary | ICD-10-CM | POA: Diagnosis not present

## 2023-08-30 DIAGNOSIS — Z9842 Cataract extraction status, left eye: Secondary | ICD-10-CM

## 2023-08-30 DIAGNOSIS — Z955 Presence of coronary angioplasty implant and graft: Secondary | ICD-10-CM

## 2023-08-30 DIAGNOSIS — Z961 Presence of intraocular lens: Secondary | ICD-10-CM | POA: Diagnosis present

## 2023-08-30 DIAGNOSIS — I440 Atrioventricular block, first degree: Secondary | ICD-10-CM | POA: Diagnosis present

## 2023-08-30 DIAGNOSIS — G20A1 Parkinson's disease without dyskinesia, without mention of fluctuations: Secondary | ICD-10-CM | POA: Diagnosis present

## 2023-08-30 DIAGNOSIS — I129 Hypertensive chronic kidney disease with stage 1 through stage 4 chronic kidney disease, or unspecified chronic kidney disease: Secondary | ICD-10-CM | POA: Diagnosis present

## 2023-08-30 DIAGNOSIS — Z96652 Presence of left artificial knee joint: Secondary | ICD-10-CM | POA: Diagnosis present

## 2023-08-30 DIAGNOSIS — Z9841 Cataract extraction status, right eye: Secondary | ICD-10-CM

## 2023-08-30 LAB — CBC WITH DIFFERENTIAL/PLATELET
Abs Immature Granulocytes: 0.02 K/uL (ref 0.00–0.07)
Basophils Absolute: 0.1 K/uL (ref 0.0–0.1)
Basophils Relative: 1 %
Eosinophils Absolute: 0.1 K/uL (ref 0.0–0.5)
Eosinophils Relative: 2 %
HCT: 38.5 % (ref 36.0–46.0)
Hemoglobin: 13.2 g/dL (ref 12.0–15.0)
Immature Granulocytes: 0 %
Lymphocytes Relative: 37 %
Lymphs Abs: 2.3 K/uL (ref 0.7–4.0)
MCH: 29.7 pg (ref 26.0–34.0)
MCHC: 34.3 g/dL (ref 30.0–36.0)
MCV: 86.5 fL (ref 80.0–100.0)
Monocytes Absolute: 0.6 K/uL (ref 0.1–1.0)
Monocytes Relative: 10 %
Neutro Abs: 3 K/uL (ref 1.7–7.7)
Neutrophils Relative %: 50 %
Platelets: 161 K/uL (ref 150–400)
RBC: 4.45 MIL/uL (ref 3.87–5.11)
RDW: 13.2 % (ref 11.5–15.5)
WBC: 6.1 K/uL (ref 4.0–10.5)
nRBC: 0 % (ref 0.0–0.2)

## 2023-08-30 LAB — TROPONIN I (HIGH SENSITIVITY): Troponin I (High Sensitivity): 11 ng/L (ref <18)

## 2023-08-30 LAB — BASIC METABOLIC PANEL WITH GFR
Anion gap: 10 (ref 5–15)
BUN: 22 mg/dL (ref 8–23)
CO2: 25 mmol/L (ref 22–32)
Calcium: 9.9 mg/dL (ref 8.9–10.3)
Chloride: 91 mmol/L — ABNORMAL LOW (ref 98–111)
Creatinine, Ser: 0.88 mg/dL (ref 0.44–1.00)
GFR, Estimated: >60 mL/min (ref >60)
Glucose, Bld: 106 mg/dL — ABNORMAL HIGH (ref 70–99)
Potassium: 4.5 mmol/L (ref 3.5–5.1)
Sodium: 126 mmol/L — ABNORMAL LOW (ref 135–145)

## 2023-08-30 LAB — SODIUM, URINE, RANDOM: Sodium, Ur: 46 mmol/L

## 2023-08-30 LAB — OSMOLALITY, URINE: Osmolality, Ur: 283 mosm/kg — ABNORMAL LOW (ref 300–900)

## 2023-08-30 LAB — TSH: TSH: 6.87 u[IU]/mL — ABNORMAL HIGH (ref 0.350–4.500)

## 2023-08-30 LAB — OSMOLALITY: Osmolality: 266 mosm/kg — ABNORMAL LOW (ref 275–295)

## 2023-08-30 MED ORDER — IOHEXOL 350 MG/ML SOLN
75.0000 mL | Freq: Once | INTRAVENOUS | Status: AC | PRN
  Administered 2023-08-30: 75 mL via INTRAVENOUS

## 2023-08-30 MED ORDER — ASPIRIN 81 MG PO TBEC
81.0000 mg | DELAYED_RELEASE_TABLET | Freq: Every day | ORAL | Status: DC
  Administered 2023-08-30 – 2023-09-02 (×6): 81 mg via ORAL
  Filled 2023-08-30 (×4): qty 1

## 2023-08-30 MED ORDER — LORATADINE 10 MG PO TABS
10.0000 mg | ORAL_TABLET | Freq: Every day | ORAL | Status: DC
  Administered 2023-08-30 – 2023-09-02 (×6): 10 mg via ORAL
  Filled 2023-08-30 (×4): qty 1

## 2023-08-30 MED ORDER — ROSUVASTATIN CALCIUM 20 MG PO TABS
20.0000 mg | ORAL_TABLET | Freq: Every day | ORAL | Status: DC
  Administered 2023-08-30 – 2023-09-01 (×4): 20 mg via ORAL
  Filled 2023-08-30: qty 1
  Filled 2023-08-30 (×2): qty 2

## 2023-08-30 MED ORDER — CARBIDOPA-LEVODOPA ER 50-200 MG PO TBCR
1.0000 | EXTENDED_RELEASE_TABLET | Freq: Every day | ORAL | Status: DC
  Administered 2023-08-30 – 2023-09-01 (×4): 1 via ORAL
  Filled 2023-08-30 (×3): qty 1

## 2023-08-30 MED ORDER — ONDANSETRON HCL 4 MG/2ML IJ SOLN
4.0000 mg | Freq: Four times a day (QID) | INTRAMUSCULAR | Status: DC | PRN
  Administered 2023-09-01 (×2): 4 mg via INTRAVENOUS
  Filled 2023-08-30: qty 2

## 2023-08-30 MED ORDER — BUTALBITAL-APAP-CAFFEINE 50-325-40 MG PO TABS
1.0000 | ORAL_TABLET | Freq: Four times a day (QID) | ORAL | Status: DC | PRN
  Administered 2023-08-30 – 2023-09-01 (×5): 1 via ORAL
  Filled 2023-08-30 (×4): qty 1

## 2023-08-30 MED ORDER — ACETAMINOPHEN 500 MG PO TABS: 500.0000 mg | ORAL_TABLET | Freq: Three times a day (TID) | ORAL | Status: DC | PRN

## 2023-08-30 MED ORDER — ENOXAPARIN SODIUM 40 MG/0.4ML IJ SOSY: 40.0000 mg | PREFILLED_SYRINGE | INTRAMUSCULAR | Status: DC

## 2023-08-30 MED ORDER — CARBIDOPA-LEVODOPA 25-250 MG PO TABS
1.0000 | ORAL_TABLET | Freq: Four times a day (QID) | ORAL | Status: DC
  Administered 2023-08-30 – 2023-09-02 (×18): 1 via ORAL
  Filled 2023-08-30 (×17): qty 1

## 2023-08-30 MED ORDER — HYDRALAZINE HCL 20 MG/ML IJ SOLN
10.0000 mg | Freq: Once | INTRAMUSCULAR | Status: AC
  Administered 2023-08-30: 10 mg via INTRAVENOUS
  Filled 2023-08-30: qty 1

## 2023-08-30 MED ORDER — PANTOPRAZOLE SODIUM 40 MG PO TBEC
40.0000 mg | DELAYED_RELEASE_TABLET | Freq: Two times a day (BID) | ORAL | Status: DC
  Administered 2023-08-30 – 2023-09-02 (×10): 40 mg via ORAL
  Filled 2023-08-30 (×7): qty 1

## 2023-08-30 MED ORDER — LOSARTAN POTASSIUM 50 MG PO TABS
50.0000 mg | ORAL_TABLET | Freq: Every evening | ORAL | Status: DC
  Administered 2023-08-30 – 2023-09-01 (×4): 50 mg via ORAL
  Filled 2023-08-30 (×3): qty 1

## 2023-08-30 MED ORDER — ENOXAPARIN SODIUM 40 MG/0.4ML IJ SOSY
40.0000 mg | PREFILLED_SYRINGE | INTRAMUSCULAR | Status: DC
  Administered 2023-08-30 – 2023-09-01 (×4): 40 mg via SUBCUTANEOUS
  Filled 2023-08-30 (×3): qty 0.4

## 2023-08-30 MED ORDER — ONDANSETRON HCL 4 MG PO TABS: 4.0000 mg | ORAL_TABLET | Freq: Four times a day (QID) | ORAL | Status: DC | PRN

## 2023-08-30 MED ORDER — TRAMADOL HCL 50 MG PO TABS: 25.0000 mg | ORAL_TABLET | Freq: Every day | ORAL | Status: DC

## 2023-08-30 MED ORDER — LABETALOL HCL 5 MG/ML IV SOLN
10.0000 mg | Freq: Once | INTRAVENOUS | Status: AC
  Administered 2023-08-30: 10 mg via INTRAVENOUS
  Filled 2023-08-30: qty 4

## 2023-08-30 MED ORDER — SERTRALINE HCL 50 MG PO TABS
25.0000 mg | ORAL_TABLET | Freq: Every day | ORAL | Status: DC
  Administered 2023-08-30 – 2023-09-01 (×4): 25 mg via ORAL
  Filled 2023-08-30 (×3): qty 1

## 2023-08-30 MED ORDER — GABAPENTIN 300 MG PO CAPS
300.0000 mg | ORAL_CAPSULE | Freq: Every day | ORAL | Status: DC
Start: 2023-08-30 — End: 2023-09-02
  Administered 2023-08-30 – 2023-09-02 (×6): 300 mg via ORAL
  Filled 2023-08-30 (×4): qty 1

## 2023-08-30 NOTE — ED Notes (Signed)
 Patient transported to CT

## 2023-08-30 NOTE — ED Notes (Signed)
 Pt ABCs intact. RR even and unlabored. Pt in NAD. Bed in lowest locked position. Call bell in reach. Denies needs at this time.

## 2023-08-30 NOTE — ED Notes (Signed)
 This NT assisted the patient to use the commode inside her room. Patient was able to walk with assistance and used the toilet independently. No acute needs at this time.

## 2023-08-30 NOTE — ED Notes (Signed)
 Pt notified room is a double

## 2023-08-30 NOTE — ED Notes (Addendum)
 Pt AOX4. c/o headache, denies dizziness, SHOB, CP.

## 2023-08-30 NOTE — H&P (Addendum)
 History and Physical    Katelyn Crawford FMW:[id number] DOB: 02/25/1940 DOA: 08/30/2023  PCP: Auston Reyes BIRCH, MD (Confirm with patient/family/NH records and if not entered, this has to be entered at [facility] point of entry) Patient coming from: Home  I have personally briefly reviewed patient's old medical records in [facility]  Chief Complaint: Headache  HPI: Katelyn Crawford is a 83 y.o. female with medical history significant of HTN, Parkinson's disease, CAD s/p stenting, chronic back pain, GERD, presented with severe headache and uncontrolled hypertension.  Patient is a poor historian, with daughter at bedside gave all history.  Patient started to have a dull ache headache, located on the back of the head, intermittent for the last 2 to 3 weeks, usually responded to Tylenol .  Last night, patient started to have 10/10 headache on the back of the head, denied any blurry vision hearing changes were dizziness.  Daughter checked her blood pressure was 210 compared to baseline 130s which concerned family enough to take her to ED.  Patient denied any numbness weakness in the limbs no vision problems or hearing changes.  ED Course: Blood pressure 210/82 O2 saturation 100% room air.  CT head negative for acute findings, blood work showed BUN 22 creatinine.  78, sodium 126 potassium 4.5 WBC 6.1 hemoglobin 13.2  Patient was given labetalol  10 mg x 1 in the ED and blood pressure came down to 130/80 however patient continued to experience severe headache without significant improvement.  Review of Systems: As per HPI otherwise 14 point review of systems negative.    Past Medical History:  Diagnosis Date   Anemia    Aortic atherosclerosis (HCC)    Arthritis    Cardiomegaly    Cardiomyopathy (HCC)    Cholelithiasis    Chronic kidney disease, stage 2 (mild)    Chronic sinus bradycardia    Coronary artery disease 03/10/2012   a.) LHC 03/10/2012: 40% mLAD, 50% pRI, 50% pRCA, 90% mRCA, 50% dRCA, 50% RPDA  - unable to cannulate RCA - med mgmt; b.) LHC/PCI 06/25/2013: 30% pLAD, 40% mLAD, 20% mLCx, 30% RI, 30% pRCA, 70% mRCA (2.5 mm Promus DES), 40% dRCA-1, 90% dRCA-2 (2.5 mm Promus DES)   DDD (degenerative disc disease), thoracolumbar    Diverticulosis    Dizziness    Environmental allergies    Full dentures    GERD (gastroesophageal reflux disease) 06/25/2013   H/O bilateral cataract extraction 2019   History of diverticulosis    History of kidney stones    HLD (hyperlipidemia)    Hyperlipidemia, unspecified    Hypertension    Nephrolithiasis    Osteoporosis, post-menopausal    Parkinson disease (HCC)    Pre-diabetes    PVC's (premature ventricular contractions)    Scoliosis    Thoracic kyphosis    Trifascicular bundle branch block    a.) 1st degree AVB + RBBB + LAFB   Vascular disease     Past Surgical History:  Procedure Laterality Date   APPENDECTOMY     BREAST EXCISIONAL BIOPSY Left 1984   neg   BREAST EXCISIONAL BIOPSY Right 1997   lipoma   CATARACT EXTRACTION W/PHACO Left 09/02/2017   Procedure: CATARACT EXTRACTION PHACO AND INTRAOCULAR LENS PLACEMENT (IOC)  LEFT;  Surgeon: Mittie Gaskin, MD;  Location: [facility];  Service: Ophthalmology;  Laterality: Left;   CATARACT EXTRACTION W/PHACO Right 10/01/2017   Procedure: CATARACT EXTRACTION PHACO AND INTRAOCULAR LENS PLACEMENT (IOC) RIGHT;  Surgeon: Mittie Gaskin, MD;  Location:  [facility];  Service: Ophthalmology;  Laterality: Right;   COLONOSCOPY WITH PROPOFOL  N/A 11/27/2016   Procedure: COLONOSCOPY WITH PROPOFOL ;  Surgeon: Toledo, Ladell POUR, MD;  Location: [facility];  Service: Gastroenterology;  Laterality: N/A;   CORONARY ANGIOPLASTY WITH STENT PLACEMENT Left 06/25/2013   Procedure: CORONARY ANGIOPLASTY WITH STENT PLACEMENT; Location: [facility]; Surgeon: Dayton Dash, MD   KNEE ARTHROPLASTY Left 01/22/2017   Procedure: COMPUTER ASSISTED TOTAL KNEE ARTHROPLASTY;  Surgeon: Mardee Lynwood SQUIBB, MD;   Location: [facility];  Service: Orthopedics;  Laterality: Left;   KNEE ARTHROSCOPY Left 02/01/2015   Procedure: ARTHROSCOPY KNEE, partial medial & lateral menisectomy,,medial and patelofemoral chondroplasty;  Surgeon: Lynwood SQUIBB Mardee, MD;  Location: [facility];  Service: Orthopedics;  Laterality: Left;   LEFT HEART CATH AND CORONARY ANGIOGRAPHY Left 03/10/2012   Procedure: LEFT HEART CATH AND CORONARY ANGIOGRAPHY; Location: [facility]; Surgeon: Marsa Dooms, MD   LITHOTRIPSY Right 1995   RIGHT AXILLARY LIPOMA REMOVAL     TUBAL LIGATION     VARICOSE VEIN SURGERY       reports that she has never smoked. She has never used smokeless tobacco. She reports that she does not drink alcohol and does not use drugs.  Allergies  Allergen Reactions   Penicillins     Has patient had a PCN reaction causing immediate rash, facial/tongue/throat swelling, SOB or lightheadedness with hypotension: no Has patient had a PCN reaction causing severe rash involving mucus membranes or skin necrosis: no Has patient had a PCN reaction that required hospitalization: no Has patient had a PCN reaction occurring within the last 10 years: no If all of the above answers are NO, then may proceed with Cephalosporin use.    Sulfa Antibiotics     UNKNOWN    Family History  Problem Relation Age of Onset   Hypertension Mother    Congestive Heart Failure Father    Coronary artery disease Father    Coronary artery disease Son        STEMI w/ PCI   Breast cancer Neg Hx    Colon cancer Neg Hx    Colon polyps Neg Hx      Prior to Admission medications   Medication Sig Start Date End Date Taking? Authorizing Provider  acetaminophen  (TYLENOL ) 500 MG tablet Take 500 mg by mouth every 8 (eight) hours as needed.   Yes [provider]  aspirin  EC 81 MG tablet Take 81 mg by mouth daily.   Yes [provider]  B COMPLEX VITAMINS PO Take 1 tablet by mouth daily.   Yes [provider]  calcium   carbonate (OS-CAL - DOSED IN MG OF ELEMENTAL CALCIUM ) 1250 (500 Ca) MG tablet Take 625 mg by mouth 2 (two) times daily with a meal.   Yes [provider]  carbidopa -levodopa  (SINEMET  CR) 50-200 MG tablet Take 1 tablet by mouth at bedtime.   Yes [provider]  carbidopa -levodopa  (SINEMET  IR) 25-250 MG tablet Take 1 tablet by mouth 4 (four) times daily. 0700, 1100, 1500, and 1900   Yes [provider]  cetirizine (ZYRTEC) 10 MG tablet Take 10 mg by mouth at bedtime.    Yes [provider]  Cholecalciferol  (VITAMIN D -3) 1000 units CAPS Take 1,000 Int'l Units by mouth daily.   Yes [provider]  fluticasone  (FLONASE ) 50 MCG/ACT nasal spray Place 2 sprays into both nostrils daily. Patient taking differently: Place 2 sprays into both nostrils daily as needed. 12/15/16  Yes Van Knee, MD  gabapentin  (NEURONTIN )  300 MG capsule Take 300 mg by mouth daily.   Yes [provider]  losartan  (COZAAR ) 25 MG tablet Take 50 mg by mouth Nightly.   Yes [provider]  magnesium  sulfate 500 MG/ML SOLN oral syringe Take 250 mg by mouth daily.   Yes [provider]  Multiple Vitamins-Minerals (MULTIVITAMIN ADULT) TABS Take 1 tablet by mouth 2 (two) times daily.   Yes [provider]  nitroGLYCERIN (NITROSTAT) 0.4 MG SL tablet Place 0.4 mg under the tongue every 5 (five) minutes as needed for chest pain.   Yes [provider]  OVER THE COUNTER MEDICATION Take 1 capsule by mouth daily.  Herbal Name: Carotenoid Antioxidants   Yes [provider]  pantoprazole  (PROTONIX ) 40 MG tablet Take 40 mg by mouth 2 (two) times daily before a meal.   Yes [provider]  rosuvastatin  (CRESTOR ) 20 MG tablet Take 20 mg by mouth at bedtime.   Yes [provider]  sertraline  (ZOLOFT ) 50 MG tablet Take 25 mg by mouth at bedtime.    Yes [provider]  traMADol  (ULTRAM ) 50 MG tablet Take 25-50 mg by  mouth at bedtime. 08/13/23  Yes [provider]  vitamin C  (ASCORBIC ACID ) 500 MG tablet Take 500 mg by mouth daily.   Yes [provider]  gabapentin  (NEURONTIN ) 100 MG capsule Take 100 mg by mouth 1 day or 1 dose. Patient not taking: Reported on 08/30/2023    [provider]  hydrALAZINE  (APRESOLINE ) 25 MG tablet Take 25 mg by mouth daily as needed (when SBP > 150 mid day). Patient not taking: Reported on 08/30/2023    [provider]  Multiple Minerals (CALCIUM -MAGNESIUM -ZINC ) TABS Take 1 tablet by mouth daily. Patient not taking: Reported on 08/30/2023    [provider]  ondansetron  (ZOFRAN ) 4 MG tablet Take 4 mg by mouth every 8 (eight) hours as needed for nausea or vomiting. Patient not taking: Reported on 08/30/2023    [provider]    Physical Exam: Vitals:   08/30/23 0645 08/30/23 0800 08/30/23 0805 08/30/23 0848  BP: (!) 172/76 131/80    Pulse: 67 (!) 58 65   Resp:  10 (!) 22   Temp:    98.2 F (36.8 C)  TempSrc:    Oral  SpO2: 100% 99% 100%   Weight:      Height:        Constitutional: NAD, calm, comfortable Vitals:   08/30/23 0645 08/30/23 0800 08/30/23 0805 08/30/23 0848  BP: (!) 172/76 131/80    Pulse: 67 (!) 58 65   Resp:  10 (!) 22   Temp:    98.2 F (36.8 C)  TempSrc:    Oral  SpO2: 100% 99% 100%   Weight:      Height:       Eyes: PERRL, lids and conjunctivae normal ENMT: Mucous membranes are moist. Posterior pharynx clear of any exudate or lesions.Normal dentition.  Neck: normal, supple, no masses, no thyromegaly Respiratory: clear to auscultation bilaterally, no wheezing, no crackles. Normal respiratory effort. No accessory muscle use.  Cardiovascular: Regular rate and rhythm, no murmurs / rubs / gallops. No extremity edema. 2+ pedal pulses. No carotid bruits.  Abdomen: no tenderness, no masses palpated. No hepatosplenomegaly. Bowel sounds positive.  Musculoskeletal: no clubbing / cyanosis. No joint  deformity upper and lower extremities. Good ROM, no contractures. Normal muscle tone.  Skin: no rashes, lesions, ulcers. No induration Neurologic: CN 2-12 grossly intact. Sensation intact,  DTR normal. Strength 5/5 in all 4.  Psychiatric: Normal judgment and insight. Alert and oriented x 3. Normal mood.     Labs on Admission: I have personally reviewed following labs and imaging studies  CBC: Recent Labs  Lab 08/30/23 0452  WBC 6.1  NEUTROABS 3.0  HGB 13.2  HCT 38.5  MCV 86.5  PLT 161   Basic Metabolic Panel: Recent Labs  Lab 08/30/23 0452  NA 126*  K 4.5  CL 91*  CO2 25  GLUCOSE 106*  BUN 22  CREATININE 0.88  CALCIUM  9.9   GFR: Estimated Creatinine Clearance: 37.3 mL/min (by C-G formula based on SCr of 0.88 mg/dL). Liver Function Tests: No results for input(s): AST, ALT, ALKPHOS, BILITOT, PROT, ALBUMIN in the last 168 hours. No results for input(s): LIPASE, AMYLASE in the last 168 hours. No results for input(s): AMMONIA in the last 168 hours. Coagulation Profile: No results for input(s): INR, PROTIME in the last 168 hours. Cardiac Enzymes: No results for input(s): CKTOTAL, CKMB, CKMBINDEX, TROPONINI in the last 168 hours. BNP (last 3 results) No results for input(s): PROBNP in the last 8760 hours. HbA1C: No results for input(s): HGBA1C in the last 72 hours. CBG: No results for input(s): GLUCAP in the last 168 hours. Lipid Profile: No results for input(s): CHOL, HDL, LDLCALC, TRIG, CHOLHDL, LDLDIRECT in the last 72 hours. Thyroid Function Tests: No results for input(s): TSH, T4TOTAL, FREET4, T3FREE, THYROIDAB in the last 72 hours. Anemia Panel: No results for input(s): VITAMINB12, FOLATE, FERRITIN, TIBC, IRON, RETICCTPCT in the last 72 hours. Urine analysis:    Component Value Date/Time   COLORURINE AMBER (A) 01/07/2022 1826   APPEARANCEUR CLOUDY (A) 01/07/2022 1826   LABSPEC 1.010  01/07/2022 1826   PHURINE 7.0 01/07/2022 1826   GLUCOSEU NEGATIVE 01/07/2022 1826   HGBUR NEGATIVE 01/07/2022 1826   BILIRUBINUR NEGATIVE 01/07/2022 1826   KETONESUR 5 (A) 01/07/2022 1826   PROTEINUR NEGATIVE 01/07/2022 1826   NITRITE NEGATIVE 01/07/2022 1826   LEUKOCYTESUR NEGATIVE 01/07/2022 1826    Radiological Exams on Admission: CT Head Wo Contrast Result Date: 08/30/2023 EXAM: CT HEAD WITHOUT CONTRAST 08/30/2023 04:58:24 AM TECHNIQUE: CT of the head was performed without the administration of intravenous contrast. Automated exposure control, iterative reconstruction, and/or weight based adjustment of the mA/kV was utilized to reduce the radiation dose to as low as reasonably achievable. COMPARISON: 10/08/2022 CLINICAL HISTORY: Headache, new onset (Age >= 51y). Patient brought in by EMS, coming from home with headache for 2 weeks. Pressure all over head today. Patient found today with BP 215/90, HR 80, 85 BG. FINDINGS: BRAIN AND VENTRICLES: No acute hemorrhage. Gray-white differentiation is preserved. No hydrocephalus. No extra-axial collection. No mass effect or midline shift. Mild periventricular white matter hypoattenuation is similar to the prior exam, likely reflecting the sequelae of chronic microvascular ischemia. Normal lacunar infarct is present in the posterior right cerebellum. ORBITS: Bilateral lens replacements are noted. The globes and orbits are otherwise within normal limits. SINUSES: No acute abnormality. SOFT TISSUES AND SKULL: No acute soft tissue abnormality. No skull fracture. Atherosclerotic calcifications are present in the cavernous carotid arteries bilaterally and at the dural margin of both vertebral arteries. No hyperdense vessel is present. IMPRESSION: 1. No acute intracranial abnormality related to the new onset headache. 2. Mild periventricular white matter hypoattenuation, similar to the prior exam, likely reflecting the sequelae of chronic microvascular ischemia. 3.  Atherosclerotic calcifications in the cavernous carotid arteries bilaterally and at the dural margin of both vertebral arteries.  No hyperdense vessel is present. Electronically signed by: Lonni Necessary MD 08/30/2023 05:13 AM EDT RP Workstation: [id number]    EKG: Independently reviewed.  Sinus rhythm, no acute ST changes.  Assessment/Plan Principal Problem:   Hypertensive urgency Active Problems:   HTN (hypertension), malignant  (please populate well all problems here in Problem List. (For example, if patient is on BP meds at home and you resume or decide to hold them, it is a problem that needs to be her. Same for CAD, COPD, HLD and so on)  Intractable headache - No clear etiology - Given there is a significant changes of severity of headache, decided to order CT venogram to rule out intracranial venous thrombosis. - Fioricet  as needed - Other DDx, DC tramadol  for concern about narcotics induced headache  HTN emergency - Responded to IV labetalol  but no significant improvement of patient's headache. - Resume home BP meds including losartan   Hyponatremia - Unclear etiology - Check hyponatremia study, check TSH - Fluid restriction  Parkinson's disease - Stable, continue Sinemet   Chronic back pain - Continue gabapentin   CAD - No acute concern, continue losartan  and statin and aspirin   Total time spent on patient care, 55 minutes.  DVT prophylaxis: Lovenox  Code Status: Full code Family Communication: Daughter at bedside Disposition Plan: Expect less than 2 midnight hospital stay Consults called: None Admission status: Telemetry observation   Cort ONEIDA Mana MD [facility] Pager [phone]  08/30/2023, 9:19 AM

## 2023-08-30 NOTE — ED Notes (Signed)
 Pt assisted to bedside commode

## 2023-08-30 NOTE — ED Triage Notes (Signed)
 Pt BIB [facility], coming from home with headache x2 weeks. Pressure all over head today. Pt found today with BP 215/90, HR 80, 85 BG. Nitroglycerin spray given and BP improved to 174/91.  - Stroke scale  Past Medical History:  Diagnosis Date   Anemia    Aortic atherosclerosis (HCC)    Arthritis    Cardiomegaly    Cardiomyopathy (HCC)    Cholelithiasis    Chronic kidney disease, stage 2 (mild)    Chronic sinus bradycardia    Coronary artery disease 03/10/2012   a.) LHC 03/10/2012: 40% mLAD, 50% pRI, 50% pRCA, 90% mRCA, 50% dRCA, 50% RPDA - unable to cannulate RCA - med mgmt; b.) LHC/PCI 06/25/2013: 30% pLAD, 40% mLAD, 20% mLCx, 30% RI, 30% pRCA, 70% mRCA (2.5 mm Promus DES), 40% dRCA-1, 90% dRCA-2 (2.5 mm Promus DES)   DDD (degenerative disc disease), thoracolumbar    Diverticulosis    Dizziness    Environmental allergies    Full dentures    GERD (gastroesophageal reflux disease) 06/25/2013   H/O bilateral cataract extraction 2019   History of diverticulosis    History of kidney stones    HLD (hyperlipidemia)    Hyperlipidemia, unspecified    Hypertension    Nephrolithiasis    Osteoporosis, post-menopausal    Parkinson disease (HCC)    Pre-diabetes    PVC's (premature ventricular contractions)    Scoliosis    Thoracic kyphosis    Trifascicular bundle branch block    a.) 1st degree AVB + RBBB + LAFB   Vascular disease

## 2023-08-30 NOTE — ED Provider Notes (Signed)
 [facility] Provider Note    Event Date/Time   First MD Initiated Contact with Patient 08/30/23 [phone]     (approximate)   History   Headache   HPI Katelyn Crawford is a 83 y.o. female who presents for evaluation of severe headache.  She says she woke up with a bad headache and that the back of her head was sore to the touch.  She has had no fall or injury recently and she is not sensitive to light or having any nausea or vomiting.  When EMS arrived they found that her blood pressure was above 200 systolic.  They gave her a spray of nitroglycerin which brought down her blood pressure to the 170s and it also made her headache go away although the back of her head is still tender.  This is not happened to her before and she says she has been compliant with her medication regimen.  Nothing in particular made the symptoms better or worse except as described above.  No recent changes to her medication, no fever, chest pain, nor shortness of breath.  No nausea or vomiting.     Physical Exam   Triage Vital Signs: ED Triage Vitals  Encounter Vitals Group     BP 08/30/23 0446 (!) 210/82     Girls Systolic BP Percentile --      Girls Diastolic BP Percentile --      Boys Systolic BP Percentile --      Boys Diastolic BP Percentile --      Pulse Rate 08/30/23 0446 72     Resp 08/30/23 0446 20     Temp 08/30/23 0504 98.2 F (36.8 C)     Temp Source 08/30/23 0504 Oral     SpO2 08/30/23 0446 100 %     Weight 08/30/23 0442 69.9 kg (154 lb)     Height 08/30/23 0442 1.422 m (4' 8)     Head Circumference --      Peak Flow --      Pain Score 08/30/23 0440 5     Pain Loc --      Pain Education --      Exclude from Growth Chart --     Most recent vital signs: Vitals:   08/30/23 0530 08/30/23 0600  BP: (!) 202/89 (!) 157/48  Pulse: 73 (!) 59  Resp: 15 20  Temp:    SpO2: 100% 99%    General: Awake, no distress.  CV:  Good peripheral perfusion.  Resp:  Normal  effort. Speaking easily and comfortably, no accessory muscle usage nor intercostal retractions.   Abd:  No distention.  Other:  NIH stroke scale 0, no focal neurological deficits   ED Results / Procedures / Treatments   Labs (all labs ordered are listed, but only abnormal results are displayed) Labs Reviewed  BASIC METABOLIC PANEL WITH GFR - Abnormal; Notable for the following components:      Result Value   Sodium 126 (*)    Chloride 91 (*)    Glucose, Bld 106 (*)    All other components within normal limits  CBC WITH DIFFERENTIAL/PLATELET  TROPONIN I (HIGH SENSITIVITY)     EKG  ED ECG REPORT I, Darleene Dome, the attending physician, personally viewed and interpreted this ECG.  Date: 08/30/2023 EKG Time: 4:46 AM Rate: 74 Rhythm: Sinus rhythm with first-degree AV block QRS Axis: normal Intervals: Prolonged PR interval of 247 ms, otherwise unremarkable ST/T Wave abnormalities: Non-specific ST  segment / T-wave changes, but no clear evidence of acute ischemia. Narrative Interpretation: no definitive evidence of acute ischemia; does not meet STEMI criteria.    RADIOLOGY See ED course for details   PROCEDURES:  Critical Care performed: No  Procedures    IMPRESSION / MDM / ASSESSMENT AND PLAN / ED COURSE  I reviewed the triage vital signs and the nursing notes.                              Differential diagnosis includes, but is not limited to, hypertensive urgency/emergency, intracranial bleed, renal dysfunction  Patient's presentation is most consistent with acute presentation with potential threat to life or bodily function.  Labs/studies ordered: EKG, CT head, CBC with differential, high-sensitivity troponin, basic metabolic panel  Interventions/Medications given:  Medications  labetalol  (NORMODYNE ) injection 10 mg (10 mg Intravenous Given 08/30/23 0538)    (Note:  hospital course my include additional interventions and/or labs/studies not listed  above.)   Patient felt better after nitroglycerin by EMS, but her head started hurting again as her blood pressure came up.  I will give her a dose of labetalol  10 mg IV.  Head CT pending, labs generally reassuring and unremarkable.     Clinical Course as of 08/30/23 0715  Sat Aug 30, 2023  0545 I independently viewed and interpreted the patient's head CT and I see no evidence of acute intracranial bleed.  I also read the radiologist's report, which confirmed no acute findings. [CF]  [phone] Patient is feeling better after the labetalol  10 mg IV.  Her blood pressure has come down to about 157/50.  Her headache seems to correlate with her uncontrolled hypertension.  Labs are notable only for some hyponatremia.  I will hold off on any IV fluids given that rapid correction with normal sodium is unlikely to be beneficial.  CBC is normal.  I discussed the situation with the patient and her daughter.  Since she is requiring IV medication to control her hypertension and improve her headache, I will consult the hospitalist team for admission for additional management.  I will hold off on an infusion of antihypertensives given that a dose of labetalol  controlled her well at least temporarily.  I explained that this does not correlate exactly given that she is also tender to palpation of the back of her head, but there does seem to be a direct correlation between blood pressure control and her severe headache.  Consulting hospitalist team for admission [CF]  [phone] I consulted by phone with the admitting hospitalist, and they will admit the patient - Dr. Lawence [CF]    Clinical Course User Index [CF] Gordan Huxley, MD     FINAL CLINICAL IMPRESSION(S) / ED DIAGNOSES   Final diagnoses:  Hypertensive urgency  Severe headache  Hyponatremia     Rx / DC Orders   ED Discharge Orders     None        Note:  This document was prepared using Dragon voice recognition software and may include  unintentional dictation errors.   Gordan Huxley, MD 08/30/23 [phone]

## 2023-08-30 NOTE — ED Notes (Signed)
 Pt assisted to bedside commode and given additional pillow

## 2023-08-31 ENCOUNTER — Observation Stay

## 2023-08-31 DIAGNOSIS — Z955 Presence of coronary angioplasty implant and graft: Secondary | ICD-10-CM | POA: Diagnosis not present

## 2023-08-31 DIAGNOSIS — R519 Headache, unspecified: Secondary | ICD-10-CM | POA: Diagnosis present

## 2023-08-31 DIAGNOSIS — Z882 Allergy status to sulfonamides status: Secondary | ICD-10-CM | POA: Diagnosis not present

## 2023-08-31 DIAGNOSIS — Z96652 Presence of left artificial knee joint: Secondary | ICD-10-CM | POA: Diagnosis present

## 2023-08-31 DIAGNOSIS — Z961 Presence of intraocular lens: Secondary | ICD-10-CM | POA: Diagnosis present

## 2023-08-31 DIAGNOSIS — E871 Hypo-osmolality and hyponatremia: Secondary | ICD-10-CM | POA: Diagnosis present

## 2023-08-31 DIAGNOSIS — I10 Essential (primary) hypertension: Secondary | ICD-10-CM | POA: Diagnosis not present

## 2023-08-31 DIAGNOSIS — M549 Dorsalgia, unspecified: Secondary | ICD-10-CM | POA: Diagnosis present

## 2023-08-31 DIAGNOSIS — R110 Nausea: Secondary | ICD-10-CM | POA: Diagnosis not present

## 2023-08-31 DIAGNOSIS — Z8249 Family history of ischemic heart disease and other diseases of the circulatory system: Secondary | ICD-10-CM | POA: Diagnosis not present

## 2023-08-31 DIAGNOSIS — I129 Hypertensive chronic kidney disease with stage 1 through stage 4 chronic kidney disease, or unspecified chronic kidney disease: Secondary | ICD-10-CM | POA: Diagnosis present

## 2023-08-31 DIAGNOSIS — N182 Chronic kidney disease, stage 2 (mild): Secondary | ICD-10-CM | POA: Diagnosis present

## 2023-08-31 DIAGNOSIS — Z9841 Cataract extraction status, right eye: Secondary | ICD-10-CM | POA: Diagnosis not present

## 2023-08-31 DIAGNOSIS — Z87442 Personal history of urinary calculi: Secondary | ICD-10-CM | POA: Diagnosis not present

## 2023-08-31 DIAGNOSIS — I160 Hypertensive urgency: Secondary | ICD-10-CM | POA: Diagnosis present

## 2023-08-31 DIAGNOSIS — G8929 Other chronic pain: Secondary | ICD-10-CM | POA: Diagnosis present

## 2023-08-31 DIAGNOSIS — Z7982 Long term (current) use of aspirin: Secondary | ICD-10-CM | POA: Diagnosis not present

## 2023-08-31 DIAGNOSIS — I2510 Atherosclerotic heart disease of native coronary artery without angina pectoris: Secondary | ICD-10-CM | POA: Diagnosis present

## 2023-08-31 DIAGNOSIS — Z79899 Other long term (current) drug therapy: Secondary | ICD-10-CM | POA: Diagnosis not present

## 2023-08-31 DIAGNOSIS — I440 Atrioventricular block, first degree: Secondary | ICD-10-CM | POA: Diagnosis present

## 2023-08-31 DIAGNOSIS — E785 Hyperlipidemia, unspecified: Secondary | ICD-10-CM | POA: Diagnosis present

## 2023-08-31 DIAGNOSIS — Z9842 Cataract extraction status, left eye: Secondary | ICD-10-CM | POA: Diagnosis not present

## 2023-08-31 DIAGNOSIS — Z880 Allergy status to penicillin: Secondary | ICD-10-CM | POA: Diagnosis not present

## 2023-08-31 DIAGNOSIS — G20A1 Parkinson's disease without dyskinesia, without mention of fluctuations: Secondary | ICD-10-CM | POA: Diagnosis present

## 2023-08-31 LAB — CBC WITH DIFFERENTIAL/PLATELET
Abs Immature Granulocytes: 0.03 K/uL (ref 0.00–0.07)
Basophils Absolute: 0 K/uL (ref 0.0–0.1)
Basophils Relative: 1 %
Eosinophils Absolute: 0.1 K/uL (ref 0.0–0.5)
Eosinophils Relative: 2 %
HCT: 37.2 % (ref 36.0–46.0)
Hemoglobin: 12.6 g/dL (ref 12.0–15.0)
Immature Granulocytes: 0 %
Lymphocytes Relative: 27 %
Lymphs Abs: 2 K/uL (ref 0.7–4.0)
MCH: 29.4 pg (ref 26.0–34.0)
MCHC: 33.9 g/dL (ref 30.0–36.0)
MCV: 86.9 fL (ref 80.0–100.0)
Monocytes Absolute: 0.8 K/uL (ref 0.1–1.0)
Monocytes Relative: 11 %
Neutro Abs: 4.3 K/uL (ref 1.7–7.7)
Neutrophils Relative %: 59 %
Platelets: 166 K/uL (ref 150–400)
RBC: 4.28 MIL/uL (ref 3.87–5.11)
RDW: 13.3 % (ref 11.5–15.5)
WBC: 7.4 K/uL (ref 4.0–10.5)
nRBC: 0 % (ref 0.0–0.2)

## 2023-08-31 LAB — BASIC METABOLIC PANEL WITH GFR
Anion gap: 15 (ref 5–15)
BUN: 20 mg/dL (ref 8–23)
CO2: 19 mmol/L — ABNORMAL LOW (ref 22–32)
Calcium: 9.2 mg/dL (ref 8.9–10.3)
Chloride: 91 mmol/L — ABNORMAL LOW (ref 98–111)
Creatinine, Ser: 1.11 mg/dL — ABNORMAL HIGH (ref 0.44–1.00)
GFR, Estimated: 49 mL/min — ABNORMAL LOW (ref >60)
Glucose, Bld: 93 mg/dL (ref 70–99)
Potassium: 4.3 mmol/L (ref 3.5–5.1)
Sodium: 125 mmol/L — ABNORMAL LOW (ref 135–145)

## 2023-08-31 LAB — T4, FREE: Free T4: 0.97 ng/dL (ref 0.61–1.12)

## 2023-08-31 LAB — COMPREHENSIVE METABOLIC PANEL WITH GFR
ALT: 7 U/L (ref 0–44)
AST: 35 U/L (ref 15–41)
Albumin: 4 g/dL (ref 3.5–5.0)
Alkaline Phosphatase: 60 U/L (ref 38–126)
Anion gap: 10 (ref 5–15)
BUN: 16 mg/dL (ref 8–23)
CO2: 22 mmol/L (ref 22–32)
Calcium: 8.9 mg/dL (ref 8.9–10.3)
Chloride: 91 mmol/L — ABNORMAL LOW (ref 98–111)
Creatinine, Ser: 0.79 mg/dL (ref 0.44–1.00)
GFR, Estimated: >60 mL/min (ref >60)
Glucose, Bld: 118 mg/dL — ABNORMAL HIGH (ref 70–99)
Potassium: 3.9 mmol/L (ref 3.5–5.1)
Sodium: 123 mmol/L — ABNORMAL LOW (ref 135–145)
Total Bilirubin: 0.9 mg/dL (ref 0.0–1.2)
Total Protein: 6.8 g/dL (ref 6.5–8.1)

## 2023-08-31 MED ORDER — SODIUM CHLORIDE 0.9 % IV SOLN: INTRAVENOUS | Status: AC

## 2023-08-31 MED ORDER — LEVOTHYROXINE SODIUM 25 MCG PO TABS
25.0000 ug | ORAL_TABLET | Freq: Every day | ORAL | Status: DC
  Administered 2023-09-01 – 2023-09-02 (×4): 25 ug via ORAL
  Filled 2023-08-31 (×2): qty 1

## 2023-08-31 NOTE — Plan of Care (Signed)

## 2023-08-31 NOTE — Progress Notes (Signed)
 PROGRESS NOTE    Katelyn Crawford  FMW:[id number] DOB: 04-06-1940 DOA: 08/30/2023 PCP: Auston Reyes BIRCH, MD  Chief Complaint  Patient presents with   Headache    Hospital Course:  Katelyn Crawford patient is an 83 year old female with history of hypertension, Parkinson's disease, CAD status post stenting, chronic back pain, GERD, who presented with severe headache and uncontrolled hypertension.  Patient reported a dull headache in the occipital region which has been intermittent over the last 2 to 3 weeks but typically responsive to Tylenol .  The night prior to arrival she began having 10 out of 10 headache and some sensitivity to sound.  Her blood pressure was checked and noted to be SBP over 200 and thus family brought her to ED.  On arrival to ED blood pressure was 210/82, head CT negative for acute findings.  Labs revealed hyponatremia with a sodium of 126.  Patient received labetalol  x 1 in the ED and blood pressure resolved to 130/80.  She was admitted for further workup  Subjective: This morning patient reports no residual headache.  Her children are at bedside.  They report that she has had intermittent episodes of fatigue over the last few weeks.  They are unsure if this of the Parkinson's or alternative etiology.  They report that she feels enthusiastic to get up and get going but has been unable to do so due to debilitating fatigue.   Objective: Vitals:   08/31/23 0018 08/31/23 0435 08/31/23 0810 08/31/23 1202  BP: 129/63 134/68 (!) 165/72 (!) 123/55  Pulse: 65 61 66 90  Resp: 17 17    Temp: 98.8 F (37.1 C) 98.4 F (36.9 C) 97.9 F (36.6 C) 97.7 F (36.5 C)  TempSrc: Oral Oral    SpO2: 98% 100% 96% 100%  Weight:      Height:        Intake/Output Summary (Last 24 hours) at 08/31/2023 1306 Last data filed at 08/30/2023 2359 Gross per 24 hour  Intake 0 ml  Output --  Net 0 ml   Filed Weights   08/30/23 0442  Weight: 69.9 kg    Examination: General exam: Appears calm and  comfortable, NAD  Respiratory system: No work of breathing, symmetric chest wall expansion Cardiovascular system: S1 & S2 heard, RRR.  Gastrointestinal system: Abdomen is nondistended, soft and nontender.  Neuro: Alert and oriented. No focal neurological deficits. Extremities: Symmetric, expected ROM Skin: No rashes, lesions Psychiatry: Demonstrates appropriate judgement and insight. Mood & affect appropriate for situation.   Assessment & Plan:  Principal Problem:   Hypertensive urgency Active Problems:   HTN (hypertension), malignant   Hypertensive urgency - Responded to IV labetalol  x 1 - Have resumed home BP meds now and blood pressure is much improved - Questionable if hypertensive urgency was actually secondary to headache instead of other way around.  Headache is now resolved  Headache - CT venogram unremarkable, CT head unremarkable - May have been migraine headache.  Completely resolved now.  Will trial sumatriptan  if it recurs - May be in some related to hyponatremia  Hyponatremia - Unclear etiology at this time.  Presented at 57, has now down trended to 123 - Patient is clinically euvolemic, not on any diuretics. - Is on low-dose sertraline , SSRIs can cause hyponatremia.  Is not a new medication for this patient, has been on this since at least 2016.  Consider this as an etiology if no other organic cause found.  Patient was initially started on fluid  restriction but hyponatremia has worsened.  Will initiate NS at 100 cc/h and repeat at 1600 today - Urine sodium: 46, urine osmole's: 283, osmolality 266. 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  Though she is technically subclinical at this time this may be an explanation for her recurrent fatigue - Instructed patient she will need close outpatient follow-up to repeat these levels in  the near future  Parkinson's disease - Continue Sinemet  at home dose - Outpatient follow-up with neurology, Dr. Maree  Chronic back pain - Continue home meds  CAD - No acute concerns.  Resume home meds   DVT prophylaxis: lovenox    Code Status: Full Code Disposition:  Pending clinical resolution hyponatremia  Consultants:    Procedures:    Antimicrobials:  Anti-infectives (From admission, onward)    None       Data Reviewed: I have personally reviewed following labs and imaging studies CBC: Recent Labs  Lab 08/30/23 0452 08/31/23 0951  WBC 6.1 7.4  NEUTROABS 3.0 4.3  HGB 13.2 12.6  HCT 38.5 37.2  MCV 86.5 86.9  PLT 161 166   Basic Metabolic Panel: Recent Labs  Lab 08/30/23 0452 08/31/23 0951  NA 126* 123*  K 4.5 3.9  CL 91* 91*  CO2 25 22  GLUCOSE 106* 118*  BUN 22 16  CREATININE 0.88 0.79  CALCIUM  9.9 8.9   GFR: Estimated Creatinine Clearance: 41.1 mL/min (by C-G formula based on SCr of 0.79 mg/dL). Liver Function Tests: Recent Labs  Lab 08/31/23 0951  AST 35  ALT 7  ALKPHOS 60  BILITOT 0.9  PROT 6.8  ALBUMIN 4.0   CBG: No results for input(s): GLUCAP in the last 168 hours.  No results found for this or any previous visit (from the past 240 hours).   Radiology Studies: CT VENOGRAM HEAD Result Date: 08/30/2023 EXAM: CT VENOGRAM WITH CONTAST 08/30/2023 09:42:20 AM TECHNIQUE: CT venogram of the head/brain was performed with the administration of intravenous contrast. Multiplanar reformatted images are provided for review. MIP images are provided for review. Automated exposure control, iterative reconstruction, and/or weight based adjustment of the mA/kV was utilized to reduce the radiation dose to as low as reasonably achievable. COMPARISON: CT head without contrast 08/30/2023 at 04:53 AM. MR head without contrast 10/08/2022. CLINICAL HISTORY: Headache, sudden, severe. FINDINGS: BRAIN/VENTRICLES: No acute intracranial hemorrhage. No extra  axial fluid collection. Gray-white differentiation is maintained. No mass effect or midline shift. No hydrocephalus. Mild periventricular white matter changes are stable. ORBITS: No acute abnormality. SINUSES AND MASTOIDS: No acute abnormality. SOFT TISSUES AND SKULL: No acute abnormality. CT VENOGRAM: No dural venous sinus thrombosis. No significant stenosis. Atherosclerotic calcifications are again noted within the cavernous internal carotid arteries and at the drill margin of the left vertebral artery. No focal stenosis or branch vessel occlusion is present within the Circle of Willis. IMPRESSION: 1. No acute intracranial abnormality. 2. No dural venous sinus thrombosis. Electronically signed by: Lonni Necessary MD 08/30/2023 10:12 AM EDT RP Workstation: [id number]   CT Head Wo Contrast Result Date: 08/30/2023 EXAM: CT HEAD WITHOUT CONTRAST 08/30/2023 04:58:24 AM TECHNIQUE: CT of the head was performed without the administration of intravenous contrast. Automated exposure control, iterative reconstruction, and/or weight based adjustment of the mA/kV was utilized to reduce the radiation dose to as low as reasonably achievable. COMPARISON: 10/08/2022 CLINICAL HISTORY: Headache, new onset (Age >= 51y).  Patient brought in by EMS, coming from home with headache for 2 weeks. Pressure all over head today. Patient found today with BP 215/90, HR 80, 85 BG. FINDINGS: BRAIN AND VENTRICLES: No acute hemorrhage. Gray-white differentiation is preserved. No hydrocephalus. No extra-axial collection. No mass effect or midline shift. Mild periventricular white matter hypoattenuation is similar to the prior exam, likely reflecting the sequelae of chronic microvascular ischemia. Normal lacunar infarct is present in the posterior right cerebellum. ORBITS: Bilateral lens replacements are noted. The globes and orbits are otherwise within normal limits. SINUSES: No acute abnormality. SOFT TISSUES AND SKULL: No acute soft tissue  abnormality. No skull fracture. Atherosclerotic calcifications are present in the cavernous carotid arteries bilaterally and at the dural margin of both vertebral arteries. No hyperdense vessel is present. IMPRESSION: 1. No acute intracranial abnormality related to the new onset headache. 2. Mild periventricular white matter hypoattenuation, similar to the prior exam, likely reflecting the sequelae of chronic microvascular ischemia. 3. Atherosclerotic calcifications in the cavernous carotid arteries bilaterally and at the dural margin of both vertebral arteries. No hyperdense vessel is present. Electronically signed by: Lonni Necessary MD 08/30/2023 05:13 AM EDT RP Workstation: [id number]    Scheduled Meds:  aspirin  EC  81 mg Oral Daily   carbidopa -levodopa   1 tablet Oral QHS   carbidopa -levodopa   1 tablet Oral QID   enoxaparin  (LOVENOX ) injection  40 mg Subcutaneous Q24H   gabapentin   300 mg Oral Daily   [START ON 09/01/2023] levothyroxine   25 mcg Oral Q0600   loratadine   10 mg Oral Daily   losartan   50 mg Oral Nightly   pantoprazole   40 mg Oral BID AC   rosuvastatin   20 mg Oral QHS   sertraline   25 mg Oral QHS   Continuous Infusions:  sodium chloride        LOS: 0 days  MDM: Patient is high risk for one or more organ failure.  They necessitate ongoing hospitalization for continued IV therapies and subsequent lab monitoring. Total time spent interpreting labs and vitals, reviewing the medical record, coordinating care amongst consultants and care team members, directly assessing and discussing care with the patient and/or family: 55 min  Lashandra Arauz, DO [facility]  To contact the attending physician between 7A-7P please use Epic Chat. To contact the covering physician during after hours 7P-7A, please review Amion.  08/31/2023, 1:06 PM   *This document has been created with the assistance of dictation software. Please excuse typographical errors. *

## 2023-08-31 NOTE — Plan of Care (Signed)
   Problem: Education: Goal: Knowledge of General Education information will improve Description Including pain rating scale, medication(s)/side effects and non-pharmacologic comfort measures Outcome: Progressing

## 2023-08-31 NOTE — Care Management Obs Status (Signed)
 MEDICARE OBSERVATION STATUS NOTIFICATION   Patient Details  Name: Katelyn Crawford MRN: [id number] Date of Birth: 1940-07-02   Medicare Observation Status Notification Given:  Yes    Rubby Barbary W, CMA 08/31/2023, 11:10 AM

## 2023-09-01 ENCOUNTER — Encounter

## 2023-09-01 ENCOUNTER — Inpatient Hospital Stay

## 2023-09-01 ENCOUNTER — Ambulatory Visit: Payer: Medicare Other | Admitting: Physical Therapy

## 2023-09-01 DIAGNOSIS — I160 Hypertensive urgency: Secondary | ICD-10-CM | POA: Diagnosis not present

## 2023-09-01 LAB — CBC WITH DIFFERENTIAL/PLATELET
Abs Immature Granulocytes: 0.01 K/uL (ref 0.00–0.07)
Basophils Absolute: 0.1 K/uL (ref 0.0–0.1)
Basophils Relative: 1 %
Eosinophils Absolute: 0.1 K/uL (ref 0.0–0.5)
Eosinophils Relative: 2 %
HCT: 36.2 % (ref 36.0–46.0)
Hemoglobin: 12.6 g/dL (ref 12.0–15.0)
Immature Granulocytes: 0 %
Lymphocytes Relative: 39 %
Lymphs Abs: 2.2 K/uL (ref 0.7–4.0)
MCH: 29.9 pg (ref 26.0–34.0)
MCHC: 34.8 g/dL (ref 30.0–36.0)
MCV: 86 fL (ref 80.0–100.0)
Monocytes Absolute: 0.7 K/uL (ref 0.1–1.0)
Monocytes Relative: 12 %
Neutro Abs: 2.7 K/uL (ref 1.7–7.7)
Neutrophils Relative %: 46 %
Platelets: 149 K/uL — ABNORMAL LOW (ref 150–400)
RBC: 4.21 MIL/uL (ref 3.87–5.11)
RDW: 13.7 % (ref 11.5–15.5)
WBC: 5.8 K/uL (ref 4.0–10.5)
nRBC: 0 % (ref 0.0–0.2)

## 2023-09-01 LAB — COMPREHENSIVE METABOLIC PANEL WITH GFR
ALT: 22 U/L (ref 0–44)
AST: 35 U/L (ref 15–41)
Albumin: 3.9 g/dL (ref 3.5–5.0)
Alkaline Phosphatase: 60 U/L (ref 38–126)
Anion gap: 8 (ref 5–15)
BUN: 14 mg/dL (ref 8–23)
CO2: 21 mmol/L — ABNORMAL LOW (ref 22–32)
Calcium: 8.9 mg/dL (ref 8.9–10.3)
Chloride: 96 mmol/L — ABNORMAL LOW (ref 98–111)
Creatinine, Ser: 1.16 mg/dL — ABNORMAL HIGH (ref 0.44–1.00)
GFR, Estimated: 46 mL/min — ABNORMAL LOW (ref >60)
Glucose, Bld: 103 mg/dL — ABNORMAL HIGH (ref 70–99)
Potassium: 4.3 mmol/L (ref 3.5–5.1)
Sodium: 125 mmol/L — ABNORMAL LOW (ref 135–145)
Total Bilirubin: 0.7 mg/dL (ref 0.0–1.2)
Total Protein: 6.4 g/dL — ABNORMAL LOW (ref 6.5–8.1)

## 2023-09-01 LAB — HEPATIC FUNCTION PANEL
ALT: 6 U/L (ref 0–44)
AST: 31 U/L (ref 15–41)
Albumin: 3.6 g/dL (ref 3.5–5.0)
Alkaline Phosphatase: 52 U/L (ref 38–126)
Bilirubin, Direct: <0.1 mg/dL (ref 0.0–0.2)
Total Bilirubin: 0.6 mg/dL (ref 0.0–1.2)
Total Protein: 5.9 g/dL — ABNORMAL LOW (ref 6.5–8.1)

## 2023-09-01 LAB — SODIUM: Sodium: 136 mmol/L (ref 135–145)

## 2023-09-01 MED ORDER — IOHEXOL 300 MG/ML  SOLN
75.0000 mL | Freq: Once | INTRAMUSCULAR | Status: AC | PRN
  Administered 2023-09-01 (×2): 75 mL via INTRAVENOUS

## 2023-09-01 MED ORDER — NIFEDIPINE ER OSMOTIC RELEASE 30 MG PO TB24
30.0000 mg | ORAL_TABLET | Freq: Every day | ORAL | Status: AC
  Administered 2023-09-01 (×2): 30 mg via ORAL
  Filled 2023-09-01: qty 1

## 2023-09-01 MED ORDER — TOLVAPTAN 15 MG PO TABS
15.0000 mg | ORAL_TABLET | Freq: Once | ORAL | Status: AC
  Administered 2023-09-01 (×2): 15 mg via ORAL
  Filled 2023-09-01 (×2): qty 1

## 2023-09-01 NOTE — Plan of Care (Signed)
  Problem: Education: Goal: Knowledge of General Education information will improve Description: Including pain rating scale, medication(s)/side effects and non-pharmacologic comfort measures Outcome: Progressing   Problem: Coping: Goal: Level of anxiety will decrease Outcome: Progressing   Problem: Nutrition: Goal: Adequate nutrition will be maintained Outcome: Progressing   Problem: Pain Managment: Goal: General experience of comfort will improve and/or be controlled Outcome: Progressing

## 2023-09-01 NOTE — Progress Notes (Signed)
 MEDICATION RELATED CONSULT NOTE - INITIAL   Pharmacy Consult for  Tolvaptan  monitoring  Indication: Hyponatremia   Allergies  Allergen Reactions   Penicillins     Has patient had a PCN reaction causing immediate rash, facial/tongue/throat swelling, SOB or lightheadedness with hypotension: no Has patient had a PCN reaction causing severe rash involving mucus membranes or skin necrosis: no Has patient had a PCN reaction that required hospitalization: no Has patient had a PCN reaction occurring within the last 10 years: no If all of the above answers are NO, then may proceed with Cephalosporin use.    Sulfa Antibiotics     UNKNOWN    Patient Measurements: Height: 4' 8 (142.2 cm) Weight: 69.9 kg (154 lb) IBW/kg (Calculated) : 36.3 Adjusted Body Weight:   Vital Signs: Temp: 97.4 F (36.3 C) (08/11 2036) Temp Source: Oral (08/11 2036) BP: 117/52 (08/11 2036) Pulse Rate: 80 (08/11 2036) Intake/Output from previous day: 08/10 0701 - 08/11 0700 In: 1808.5 [P.O.:360; I.V.:1448.5] Out: -  Intake/Output from this shift: Total I/O In: 75 [P.O.:75] Out: 875 [Urine:875]  Labs: Recent Labs    08/30/23 0452 08/31/23 0951 08/31/23 1618 09/01/23 0831 09/01/23 1607  WBC 6.1 7.4  --  5.8  --   HGB 13.2 12.6  --  12.6  --   HCT 38.5 37.2  --  36.2  --   PLT 161 166  --  149*  --   CREATININE 0.88 0.79 1.11* 1.16*  --   ALBUMIN  --  4.0  --  3.9 3.6  PROT  --  6.8  --  6.4* 5.9*  AST  --  35  --  35 31  ALT  --  7  --  22 6  ALKPHOS  --  60  --  60 52  BILITOT  --  0.9  --  0.7 0.6  BILIDIR  --   --   --   --  <0.1  IBILI  --   --   --   --  NOT CALCULATED   Estimated Creatinine Clearance: 28.3 mL/min (A) (by C-G formula based on SCr of 1.16 mg/dL (H)).   Microbiology: No results found for this or any previous visit (from the past 720 hours).  Medical History: Past Medical History:  Diagnosis Date   Anemia    Aortic atherosclerosis (HCC)    Arthritis     Cardiomegaly    Cardiomyopathy (HCC)    Cholelithiasis    Chronic kidney disease, stage 2 (mild)    Chronic sinus bradycardia    Coronary artery disease 03/10/2012   a.) LHC 03/10/2012: 40% mLAD, 50% pRI, 50% pRCA, 90% mRCA, 50% dRCA, 50% RPDA - unable to cannulate RCA - med mgmt; b.) LHC/PCI 06/25/2013: 30% pLAD, 40% mLAD, 20% mLCx, 30% RI, 30% pRCA, 70% mRCA (2.5 mm Promus DES), 40% dRCA-1, 90% dRCA-2 (2.5 mm Promus DES)   DDD (degenerative disc disease), thoracolumbar    Diverticulosis    Dizziness    Environmental allergies    Full dentures    GERD (gastroesophageal reflux disease) 06/25/2013   H/O bilateral cataract extraction 2019   History of diverticulosis    History of kidney stones    HLD (hyperlipidemia)    Hyperlipidemia, unspecified    Hypertension    Nephrolithiasis    Osteoporosis, post-menopausal    Parkinson disease (HCC)    Pre-diabetes    PVC's (premature ventricular contractions)    Scoliosis    Thoracic kyphosis  Trifascicular bundle branch block    a.) 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 - Alert MD if >8 mEq/L increase in serum sodium in 8 hours    [name] D 09/01/2023,10:45 PM

## 2023-09-01 NOTE — Progress Notes (Signed)
 PROGRESS NOTE    Katelyn Crawford  FMW:[id number] DOB: [birth year] DOA: 08/30/2023 PCP: Auston Reyes BIRCH, MD  Chief Complaint  Patient presents with   Headache    Hospital Course:  Katelyn Crawford patient is an 83 year old female with history of hypertension, Parkinson's disease, CAD status post stenting, chronic back pain, GERD, who presented with severe headache and uncontrolled hypertension.  Patient reported a dull headache in the occipital region which has been intermittent over the last 2 to 3 weeks but typically responsive to Tylenol .  The night prior to arrival she began having 10 out of 10 headache and some sensitivity to sound.  Her blood pressure was checked and noted to be SBP over 200 and thus family brought her to ED.  On arrival to ED blood pressure was 210/82, head CT negative for acute findings.  Labs revealed hyponatremia with a sodium of 126.  Patient received labetalol  x 1 in the ED and blood pressure resolved to 130/80.  She was admitted for further workup  Subjective: No acute issues overnight.  Patient had blood pressure rise this morning with SBP in 190s, accompanied headache.  Received 30 mg nifedipine  with resolution in headache and blood pressure resolved towards 160s.  At the time of my evaluation patient complaining of some residual headache but reports it is much improved from prior.   Objective: Vitals:   08/31/23 2115 08/31/23 2255 09/01/23 0501 09/01/23 0825  BP: (!) 114/53 (!) 146/83 (!) 160/59 (!) 191/78  Pulse: 70 72 65 64  Resp: 20 18 18 18   Temp:  98.6 F (37 C) 98.3 F (36.8 C) 98.1 F (36.7 C)  TempSrc:  Oral Oral   SpO2:  99% 100% 100%  Weight:      Height:        Intake/Output Summary (Last 24 hours) at 09/01/2023 1528 Last data filed at 09/01/2023 1300 Gross per 24 hour  Intake 1688.46 ml  Output --  Net 1688.46 ml   Filed Weights   08/30/23 0442  Weight: 69.9 kg    Examination: General exam: Appears calm and comfortable, NAD  Respiratory  system: No work of breathing, symmetric chest wall expansion Cardiovascular system: S1 & S2 heard, RRR.  Gastrointestinal system: Abdomen is nondistended, soft and nontender.  Neuro: Alert and oriented. No focal neurological deficits. Extremities: Symmetric, expected ROM Skin: No rashes, lesions Psychiatry: Demonstrates appropriate judgement and insight. Mood & affect appropriate for situation.   Assessment & Plan:  Principal Problem:   Hypertensive urgency Active Problems:   HTN (hypertension), malignant   Hyponatremia   Hypertensive urgency - Possibly some degree of vasospasm playing a role.  Blood pressure has been very labile.  But immediately responsive to labetalol  x 1 and later nifedipine . - Continue with home BP meds and monitor blood pressure closely. - Questionable if hypertensive urgency was actually secondary to headache instead of other way around.  Headache is now resolved  Headache - CT venogram unremarkable, CT head unremarkable - Appears related to blood pressure.  Can trial sumatriptan  at next event - May be in some related to hyponatremia  Hyponatremia - Unclear etiology at this time.  Presented at 49, has now down trended to 123 - Patient is clinically euvolemic, not on any diuretics. - Is on low-dose sertraline , SSRIs can cause hyponatremia.  Is not a new medication for this patient, has been on this since at least 2016.  Consider this as an etiology if no other organic cause found.  Patient  was initially started on fluid restriction but hyponatremia has worsened. - Has now been on NS for over 24 hours with no significant improvement.   - Urine sodium: 46, urine osmole's: 283, osmolality 266. 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  CXR negative for lung pathology. - Nephrology consulted.  Proceeding with tolvaptan  today as well as CT chest. 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   Though she is technically subclinical at this time this may be an explanation for her recurrent fatigue - Instructed patient she will need close outpatient follow-up to repeat these levels in the near future  Parkinson's disease - Continue Sinemet  at home dose - Outpatient follow-up with neurology, Dr. Maree  Chronic back pain - Continue home meds  CAD - No acute concerns.  Resume home meds   DVT prophylaxis: lovenox    Code Status: Full Code Disposition:  Pending clinical resolution hyponatremia  Consultants:  Treatment Team:  Consulting Physician: Lateef, Munsoor, MD  Procedures:    Antimicrobials:  Anti-infectives (From admission, onward)    None       Data Reviewed: I have personally reviewed following labs and imaging studies CBC: Recent Labs  Lab 08/30/23 0452 08/31/23 0951 09/01/23 0831  WBC 6.1 7.4 5.8  NEUTROABS 3.0 4.3 2.7  HGB 13.2 12.6 12.6  HCT 38.5 37.2 36.2  MCV 86.5 86.9 86.0  PLT 161 166 149*   Basic Metabolic Panel: Recent Labs  Lab 08/30/23 0452 08/31/23 0951 08/31/23 1618 09/01/23 0831  NA 126* 123* 125* 125*  K 4.5 3.9 4.3 4.3  CL 91* 91* 91* 96*  CO2 25 22 19* 21*  GLUCOSE 106* 118* 93 103*  BUN 22 16 20 14   CREATININE 0.88 0.79 1.11* 1.16*  CALCIUM  9.9 8.9 9.2 8.9   GFR: Estimated Creatinine Clearance: 28.3 mL/min (A) (by C-G formula based on SCr of 1.16 mg/dL (H)). Liver Function Tests: Recent Labs  Lab 08/31/23 0951 09/01/23 0831  AST 35 35  ALT 7 22  ALKPHOS 60 60  BILITOT 0.9 0.7  PROT 6.8 6.4*  ALBUMIN 4.0 3.9   CBG: No results for input(s): GLUCAP in the last 168 hours.  No results found for this or any previous visit (from the past 240 hours).   Radiology Studies: DG Chest Port 1 View Result Date: 08/31/2023 CLINICAL DATA:  Hyponatremia. EXAM: PORTABLE CHEST 1 VIEW COMPARISON:  01/07/2022. FINDINGS: The heart is enlarged and the mediastinal contour is stable. There is atherosclerotic calcification of the  aorta. No consolidation, effusion, or pneumothorax is seen. No acute osseous abnormality. IMPRESSION: No active disease. Electronically Signed   By: Leita Birmingham M.D.   On: 08/31/2023 14:56    Scheduled Meds:  aspirin  EC  81 mg Oral Daily   carbidopa -levodopa   1 tablet Oral QHS   carbidopa -levodopa   1 tablet Oral QID   enoxaparin  (LOVENOX ) injection  40 mg Subcutaneous Q24H   gabapentin   300 mg Oral Daily   levothyroxine   25 mcg Oral Q0600   loratadine   10 mg Oral Daily   losartan   50 mg Oral Nightly   pantoprazole   40 mg Oral BID AC   rosuvastatin   20 mg Oral QHS   sertraline   25 mg Oral QHS   tolvaptan   15 mg Oral Once   Continuous Infusions:     LOS: 1 day  MDM: Patient is high risk for one or more organ failure.  They necessitate ongoing hospitalization  for continued IV therapies and subsequent lab monitoring. Total time spent interpreting labs and vitals, reviewing the medical record, coordinating care amongst consultants and care team members, directly assessing and discussing care with the patient and/or family: 55 min  Loreda Silverio, DO [facility]  To contact the attending physician between 7A-7P please use Epic Chat. To contact the covering physician during after hours 7P-7A, please review Amion.  09/01/2023, 3:28 PM   *This document has been created with the assistance of dictation software. Please excuse typographical errors. *

## 2023-09-01 NOTE — Progress Notes (Signed)
 Per [facility] ADT, DW MOD, MSSP, RRCM  MSSP RRM (Nurse) Inpatient & ED at [facility] 08/31/23 (Sunday)  Recent ED visit Location: [facility]. Date: 08/30/2023.   [facility] - Unable To Enroll Patient  A [facility] Coordinator has successfully reached patient.   Katelyn Crawford, was identified by admission/discharge/transfer alert as a candidate for [facility] care management services.   At this time, Katelyn Crawford has not been discharged from the hospital and is unable to be enrolled in [facility] Management services.  Please consider discussing the benefits of [facility] with Katelyn Crawford and referring again at a later date.  Katelyn Crawford    [address]; [place], [place] [address] l  [email or web address] l [phone]   For more information on [facility] services, click here.

## 2023-09-01 NOTE — Consult Note (Addendum)
 [facility]  CONSULT NOTE    Date: 09/01/2023                  Patient Name:  Katelyn Crawford  MRN: [id number]  DOB: [birth year]  Age / Sex: 84 y.o., female         PCP: Auston Reyes BIRCH, MD                 Service Requesting Consult: TRH                 Reason for Consult: Hyponatremia            History of Present Illness: Katelyn Crawford is a 83 y.o.  female with past medical conditions including hypertension, CAD, GERD, parkinson's disease, who was admitted to [facility] on 08/30/2023 for Hyponatremia [E87.1] Severe headache [R51.9] HTN (hypertension), malignant [I10] Hypertensive urgency [I16.0]  Patient is seen laying in bed. Daughter at bedside. Patient states she presents to ED due to headache. Does endorse poor oral intake prior to admission with increase fluid consumption. Denies nausea or vomiting prior to admission. Is experiencing nausea now.   Labs on Ed arrival concerning for sodium 126. Sodium has ranged from 123-126 during this admission. Has not responded appropriately to IV hydration.  Imaging negative for acute findings.    Medications: Outpatient medications: Medications Prior to Admission  Medication Sig Dispense Refill Last Dose/Taking   acetaminophen  (TYLENOL ) 500 MG tablet Take 500 mg by mouth every 8 (eight) hours as needed.   Taking As Needed   aspirin  EC 81 MG tablet Take 81 mg by mouth daily.   Taking   B COMPLEX VITAMINS PO Take 1 tablet by mouth daily.   Taking   calcium  carbonate (OS-CAL - DOSED IN MG OF ELEMENTAL CALCIUM ) 1250 (500 Ca) MG tablet Take 625 mg by mouth 2 (two) times daily with a meal.   Taking   carbidopa -levodopa  (SINEMET  CR) 50-200 MG tablet Take 1 tablet by mouth at bedtime.   Taking   carbidopa -levodopa  (SINEMET  IR) 25-250 MG tablet Take 1 tablet by mouth 4 (four) times daily. 0700, 1100, 1500, and 1900   Taking   cetirizine (ZYRTEC) 10 MG tablet Take 10 mg by mouth at bedtime.    Taking   Cholecalciferol  (VITAMIN  D-3) 1000 units CAPS Take 1,000 Int'l Units by mouth daily.   Taking   fluticasone  (FLONASE ) 50 MCG/ACT nasal spray Place 2 sprays into both nostrils daily. (Patient taking differently: Place 2 sprays into both nostrils daily as needed.) 16 g 0 Taking Differently   gabapentin  (NEURONTIN ) 300 MG capsule Take 300 mg by mouth daily.   Taking   losartan  (COZAAR ) 25 MG tablet Take 50 mg by mouth Nightly.   08/29/2023 Bedtime   magnesium  sulfate 500 MG/ML SOLN oral syringe Take 250 mg by mouth daily.   Taking   Multiple Vitamins-Minerals (MULTIVITAMIN ADULT) TABS Take 1 tablet by mouth 2 (two) times daily.   Taking   nitroGLYCERIN (NITROSTAT) 0.4 MG SL tablet Place 0.4 mg under the tongue every 5 (five) minutes as needed for chest pain.   Taking As Needed   OVER THE COUNTER MEDICATION Take 1 capsule by mouth daily.  Herbal Name: Carotenoid Antioxidants   Taking   pantoprazole  (PROTONIX ) 40 MG tablet Take 40 mg by mouth 2 (two) times daily before a meal.   Taking   rosuvastatin  (CRESTOR ) 20 MG tablet Take 20 mg by mouth at bedtime.  Taking   sertraline  (ZOLOFT ) 50 MG tablet Take 25 mg by mouth at bedtime.    Taking   traMADol  (ULTRAM ) 50 MG tablet Take 25-50 mg by mouth at bedtime.   Taking   vitamin C  (ASCORBIC ACID ) 500 MG tablet Take 500 mg by mouth daily.   Taking   gabapentin  (NEURONTIN ) 100 MG capsule Take 100 mg by mouth 1 day or 1 dose. (Patient not taking: Reported on 08/30/2023)   Not Taking   hydrALAZINE  (APRESOLINE ) 25 MG tablet Take 25 mg by mouth daily as needed (when SBP > 150 mid day). (Patient not taking: Reported on 08/30/2023)   Not Taking   Multiple Minerals (CALCIUM -MAGNESIUM -ZINC ) TABS Take 1 tablet by mouth daily. (Patient not taking: Reported on 08/30/2023)   Not Taking   ondansetron  (ZOFRAN ) 4 MG tablet Take 4 mg by mouth every 8 (eight) hours as needed for nausea or vomiting. (Patient not taking: Reported on 08/30/2023)   Not Taking    Current medications: Current Facility-Administered  Medications  Medication Dose Route Frequency Provider Last Rate Last Admin   aspirin  EC tablet 81 mg  81 mg Oral Daily Laurita Manor T, MD   81 mg at 09/01/23 0850   butalbital -acetaminophen -caffeine  (FIORICET ) 50-325-40 MG per tablet 1 tablet  1 tablet Oral Q6H PRN Laurita Manor DASEN, MD   1 tablet at 09/01/23 9390   carbidopa -levodopa  (SINEMET  CR) 50-200 MG per tablet controlled release 1 tablet  1 tablet Oral QHS Laurita Manor T, MD   1 tablet at 08/31/23 2125   carbidopa -levodopa  (SINEMET  IR) 25-250 MG per tablet immediate release 1 tablet  1 tablet Oral QID Laurita Manor T, MD   1 tablet at 09/01/23 1302   enoxaparin  (LOVENOX ) injection 40 mg  40 mg Subcutaneous Q24H Patel, Rutvi M, RPH   40 mg at 08/31/23 2125   gabapentin  (NEURONTIN ) capsule 300 mg  300 mg Oral Daily Laurita Manor T, MD   300 mg at 09/01/23 0849   levothyroxine  (SYNTHROID ) tablet 25 mcg  25 mcg Oral Q0600 Dezii, Alexandra, DO   25 mcg at 09/01/23 0542   loratadine  (CLARITIN ) tablet 10 mg  10 mg Oral Daily Laurita Manor T, MD   10 mg at 09/01/23 0849   losartan  (COZAAR ) tablet 50 mg  50 mg Oral Nightly Laurita Manor T, MD   50 mg at 08/31/23 2125   ondansetron  (ZOFRAN ) tablet 4 mg  4 mg Oral Q6H PRN Laurita Manor T, MD       Or   ondansetron  (ZOFRAN ) injection 4 mg  4 mg Intravenous Q6H PRN Laurita Manor T, MD   4 mg at 09/01/23 1012   pantoprazole  (PROTONIX ) EC tablet 40 mg  40 mg Oral BID AC Zhang, Ping T, MD   40 mg at 09/01/23 0849   rosuvastatin  (CRESTOR ) tablet 20 mg  20 mg Oral QHS Laurita Manor T, MD   20 mg at 08/31/23 2126   sertraline  (ZOLOFT ) tablet 25 mg  25 mg Oral QHS Laurita Manor DASEN, MD   25 mg at 08/31/23 2125      Allergies: Allergies  Allergen Reactions   Penicillins     Has patient had a PCN reaction causing immediate rash, facial/tongue/throat swelling, SOB or lightheadedness with hypotension: no Has patient had a PCN reaction causing severe rash involving mucus membranes or skin necrosis: no Has patient had a PCN  reaction that required hospitalization: no Has patient had a PCN reaction occurring within the last 10 years: no  If all of the above answers are NO, then may proceed with Cephalosporin use.    Sulfa Antibiotics     UNKNOWN      Past Medical History: Past Medical History:  Diagnosis Date   Anemia    Aortic atherosclerosis (HCC)    Arthritis    Cardiomegaly    Cardiomyopathy (HCC)    Cholelithiasis    Chronic kidney disease, stage 2 (mild)    Chronic sinus bradycardia    Coronary artery disease 03/10/2012   a.) LHC 03/10/2012: 40% mLAD, 50% pRI, 50% pRCA, 90% mRCA, 50% dRCA, 50% RPDA - unable to cannulate RCA - med mgmt; b.) LHC/PCI 06/25/2013: 30% pLAD, 40% mLAD, 20% mLCx, 30% RI, 30% pRCA, 70% mRCA (2.5 mm Promus DES), 40% dRCA-1, 90% dRCA-2 (2.5 mm Promus DES)   DDD (degenerative disc disease), thoracolumbar    Diverticulosis    Dizziness    Environmental allergies    Full dentures    GERD (gastroesophageal reflux disease) 06/25/2013   H/O bilateral cataract extraction 2019   History of diverticulosis    History of kidney stones    HLD (hyperlipidemia)    Hyperlipidemia, unspecified    Hypertension    Nephrolithiasis    Osteoporosis, post-menopausal    Parkinson disease (HCC)    Pre-diabetes    PVC's (premature ventricular contractions)    Scoliosis    Thoracic kyphosis    Trifascicular bundle branch block    a.) 1st degree AVB + RBBB + LAFB   Vascular disease      Past Surgical History: Past Surgical History:  Procedure Laterality Date   APPENDECTOMY     BREAST EXCISIONAL BIOPSY Left 1984   neg   BREAST EXCISIONAL BIOPSY Right 1997   lipoma   CATARACT EXTRACTION W/PHACO Left 09/02/2017   Procedure: CATARACT EXTRACTION PHACO AND INTRAOCULAR LENS PLACEMENT (IOC)  LEFT;  Surgeon: Mittie Gaskin, MD;  Location: [facility];  Service: Ophthalmology;  Laterality: Left;   CATARACT EXTRACTION W/PHACO Right 10/01/2017   Procedure: CATARACT  EXTRACTION PHACO AND INTRAOCULAR LENS PLACEMENT (IOC) RIGHT;  Surgeon: Mittie Gaskin, MD;  Location: [facility];  Service: Ophthalmology;  Laterality: Right;   COLONOSCOPY WITH PROPOFOL  N/A 11/27/2016   Procedure: COLONOSCOPY WITH PROPOFOL ;  Surgeon: Toledo, Ladell POUR, MD;  Location: [facility];  Service: Gastroenterology;  Laterality: N/A;   CORONARY ANGIOPLASTY WITH STENT PLACEMENT Left 06/25/2013   Procedure: CORONARY ANGIOPLASTY WITH STENT PLACEMENT; Location: [facility]; Surgeon: Dayton Dash, MD   KNEE ARTHROPLASTY Left 01/22/2017   Procedure: COMPUTER ASSISTED TOTAL KNEE ARTHROPLASTY;  Surgeon: Mardee Lynwood SQUIBB, MD;  Location: [facility];  Service: Orthopedics;  Laterality: Left;   KNEE ARTHROSCOPY Left 02/01/2015   Procedure: ARTHROSCOPY KNEE, partial medial & lateral menisectomy,,medial and patelofemoral chondroplasty;  Surgeon: Lynwood SQUIBB Mardee, MD;  Location: [facility];  Service: Orthopedics;  Laterality: Left;   LEFT HEART CATH AND CORONARY ANGIOGRAPHY Left 03/10/2012   Procedure: LEFT HEART CATH AND CORONARY ANGIOGRAPHY; Location: [facility]; Surgeon: Marsa Dooms, MD   LITHOTRIPSY Right 1995   RIGHT AXILLARY LIPOMA REMOVAL     TUBAL LIGATION     VARICOSE VEIN SURGERY       Family History: Family History  Problem Relation Age of Onset   Hypertension Mother    Congestive Heart Failure Father    Coronary artery disease Father    Coronary artery disease Son        STEMI w/ PCI   Breast cancer Neg Hx    Colon  cancer Neg Hx    Colon polyps Neg Hx      Social History: Social History   Socioeconomic History   Marital status: Widowed    Spouse name: Not on file   Number of children: 3   Years of education: 48   Highest education level: Some college, no degree  Occupational History   Not on file  Tobacco Use   Smoking status: Never   Smokeless tobacco: Never  Vaping Use   Vaping status: Never Used  Substance and Sexual Activity   Alcohol use: No   Drug  use: No   Sexual activity: Not Currently  Other Topics Concern   Not on file  Social History Narrative   Lives alone   Social Drivers of Health   Financial Resource Strain: Patient Declined (09/08/2022)   Received from [facility]   Overall Financial Resource Strain (CARDIA)    Difficulty of Paying Living Expenses: Patient declined  Food Insecurity: Patient Declined (09/08/2022)   Received from [facility]   Hunger Vital Sign    Within the past 12 months, you worried that your food would run out before you got the money to buy more.: Patient declined    Within the past 12 months, the food you bought just didn't last and you didn't have money to get more.: Patient declined  Transportation Needs: Patient Declined (09/08/2022)   Received from [facility] - Transportation    In the past 12 months, has lack of transportation kept you from medical appointments or from getting medications?: Patient declined    Lack of Transportation (Non-Medical): Patient declined  Physical Activity: Not on file  Stress: Not on file  Social Connections: Not on file  Intimate Partner Violence: Not on file     Review of Systems: Review of Systems  Constitutional:  Negative for chills, fever and malaise/fatigue.  HENT:  Negative for congestion, sore throat and tinnitus.   Eyes:  Negative for blurred vision and redness.  Respiratory:  Negative for cough, shortness of breath and wheezing.   Cardiovascular:  Negative for chest pain, palpitations, claudication and leg swelling.  Gastrointestinal:  Negative for abdominal pain, blood in stool, diarrhea, nausea and vomiting.  Genitourinary:  Negative for flank pain, frequency and hematuria.  Musculoskeletal:  Negative for back pain, falls and myalgias.  Skin:  Negative for rash.  Neurological:  Positive for headaches. Negative for dizziness and weakness.  Endo/Heme/Allergies:  Does not bruise/bleed  easily.  Psychiatric/Behavioral:  Negative for depression. The patient is not nervous/anxious and does not have insomnia.     Vital Signs: Blood pressure (!) 191/78, pulse 64, temperature 98.1 F (36.7 C), resp. rate 18, height 4' 8 (1.422 m), weight 69.9 kg, SpO2 100%.  Weight trends: Filed Weights   08/30/23 0442  Weight: 69.9 kg    Physical Exam: General: NAD  Head: Normocephalic, atraumatic. Moist oral mucosal membranes  Eyes: Anicteric  Neck: Supple  Lungs:  Clear to auscultation  Heart: Regular rate and rhythm  Abdomen:  Soft, nontender,   Extremities:  trace peripheral edema.  Neurologic: Awake and alert, oriented  Skin: No lesions        Lab results: Basic Metabolic Panel: Recent Labs  Lab 08/31/23 0951 08/31/23 1618 09/01/23 0831  NA 123* 125* 125*  K 3.9 4.3 4.3  CL 91* 91* 96*  CO2 22 19* 21*  GLUCOSE 118* 93 103*  BUN 16 20 14   CREATININE  0.79 1.11* 1.16*  CALCIUM  8.9 9.2 8.9    Liver Function Tests: Recent Labs  Lab 08/31/23 0951 09/01/23 0831  AST 35 35  ALT 7 22  ALKPHOS 60 60  BILITOT 0.9 0.7  PROT 6.8 6.4*  ALBUMIN 4.0 3.9   No results for input(s): LIPASE, AMYLASE in the last 168 hours. No results for input(s): AMMONIA in the last 168 hours.  CBC: Recent Labs  Lab 08/30/23 0452 08/31/23 0951 09/01/23 0831  WBC 6.1 7.4 5.8  NEUTROABS 3.0 4.3 2.7  HGB 13.2 12.6 12.6  HCT 38.5 37.2 36.2  MCV 86.5 86.9 86.0  PLT 161 166 149*    Cardiac Enzymes: No results for input(s): CKTOTAL, CKMB, CKMBINDEX, TROPONINI in the last 168 hours.  BNP: Invalid input(s): POCBNP  CBG: No results for input(s): GLUCAP in the last 168 hours.  Microbiology: Results for orders placed or performed during the hospital encounter of 01/28/17  Urine Culture     Status: Abnormal   Collection Time: 01/28/17  7:30 PM   Specimen: Urine, Clean Catch  Result Value Ref Range Status   Specimen Description   Final    URINE, CLEAN  CATCH Performed at [facility], [address], [place], [place] [address]    Special Requests   Final    NONE Performed at [facility], [address] [place], [place] [address]    Culture MULTIPLE SPECIES PRESENT, SUGGEST RECOLLECTION (A)  Final   Report Status 01/30/2017 FINAL  Final    Coagulation Studies: No results for input(s): LABPROT, INR in the last 72 hours.  Urinalysis: No results for input(s): COLORURINE, LABSPEC, PHURINE, GLUCOSEU, HGBUR, BILIRUBINUR, KETONESUR, PROTEINUR, UROBILINOGEN, NITRITE, LEUKOCYTESUR in the last 72 hours.  Invalid input(s): APPERANCEUR    Imaging: DG Chest Port 1 View Result Date: 08/31/2023 CLINICAL DATA:  Hyponatremia. EXAM: PORTABLE CHEST 1 VIEW COMPARISON:  01/07/2022. FINDINGS: The heart is enlarged and the mediastinal contour is stable. There is atherosclerotic calcification of the aorta. No consolidation, effusion, or pneumothorax is seen. No acute osseous abnormality. IMPRESSION: No active disease. Electronically Signed   By: Leita Birmingham M.D.   On: 08/31/2023 14:56     Assessment & Plan: Katelyn Crawford is a 83 y.o.  female with past medical conditions including hypertension, CAD, GERD, parkinson's disease,, who was admitted to [facility] on 08/30/2023 for Hyponatremia [E87.1] Severe headache [R51.9] HTN (hypertension), malignant [I10] Hypertensive urgency [I16.0]   Hyponatremia, acute. Sodium 126 on admission. Appears secondary to increased free water intake with decreased solute consumption. Initially treated with IV with little relief of symptoms. Continues to complain of headache. Head CT negative. Will order CT chest to evaluate lung malignancy. Will order trial dose of Tolvaptan  15mg  today. Expect sodium to be around 130 in am.    LOS: 1 Dequavius Kuhner 8/11/20251:59 PM la

## 2023-09-01 NOTE — Evaluation (Signed)
 Physical Therapy Evaluation Patient Details Name: Katelyn Crawford MRN: [id number] DOB: Jan 19, 1941 Today's Date: 09/01/2023  History of Present Illness  83 y/o female presented to ED on 08/30/23 for headache x 2 weeks. Admitted for HTN emergency. PMH: HTN, CAD, Parkinson's  Clinical Impression  Patient admitted with the above. PTA, patient lives alone and was independent with no AD. Daughter lives next door. Patient presents with weakness, impaired balance, and decreased activity tolerance. Patient required CGA for bed mobility and sit to stand. Ambulated short distance to [facility] with HHAx1 initially and with RW to return to bed. Patient complaining of wash of weakness and mild dizziness. Patient will benefit from skilled PT services during acute stay to address listed deficits. Patient will benefit from ongoing therapy at discharge to maximize functional independence and safety.         If plan is discharge home, recommend the following: A little help with walking and/or transfers;A little help with bathing/dressing/bathroom;Assistance with cooking/housework;Assist for transportation;Help with stairs or ramp for entrance   Can travel by private vehicle        Equipment Recommendations Rolling Tarryn Bogdan (2 wheels);BSC/3in1  Recommendations for Other Services       Functional Status Assessment Patient has had a recent decline in their functional status and demonstrates the ability to make significant improvements in function in a reasonable and predictable amount of time.     Precautions / Restrictions Precautions Precautions: Fall Recall of Precautions/Restrictions: Intact Restrictions Weight Bearing Restrictions Per Provider Order: No      Mobility  Bed Mobility Overal bed mobility: Needs Assistance Bed Mobility: Supine to Sit, Sit to Supine     Supine to sit: Contact guard Sit to supine: Contact guard assist        Transfers Overall transfer level: Needs assistance Equipment used:  None, Rolling Ayiana Winslett (2 wheels) Transfers: Sit to/from Stand Sit to Stand: Contact guard assist                Ambulation/Gait Ambulation/Gait assistance: Contact guard assist Gait Distance (Feet): 8 Feet (+8') Assistive device: 1 person hand held assist, Rolling Rosha Cocker (2 wheels) Gait Pattern/deviations: Step-to pattern, Decreased stride length Gait velocity: decreased     General Gait Details: Initially HHA to [facility] in room and used RW to get back towards bed. Patient with feeling of weakness and mild dizziness upon second attempt. Deferred further at this time per request.  Stairs            Wheelchair Mobility     Tilt Bed    Modified Rankin (Stroke Patients Only)       Balance Overall balance assessment: Needs assistance Sitting-balance support: No upper extremity supported, Feet supported Sitting balance-Leahy Scale: Good     Standing balance support: Bilateral upper extremity supported, Reliant on assistive device for balance Standing balance-Leahy Scale: Fair                               Pertinent Vitals/Pain Pain Assessment Pain Assessment: No/denies pain    Home Living Family/patient expects to be discharged to:: Private residence Living Arrangements: Alone Available Help at Discharge: Family;Available 24 hours/day Type of Home: House Home Access: Stairs to enter Entrance Stairs-Rails: Doctor, general practice of Steps: 4   Home Layout: One level Home Equipment: Agricultural consultant (2 wheels);Cane - single point      Prior Function Prior Level of Function : Independent/Modified Independent;Driving  Mobility Comments: with no AD       Extremity/Trunk Assessment   Upper Extremity Assessment Upper Extremity Assessment: Generalized weakness    Lower Extremity Assessment Lower Extremity Assessment: Generalized weakness       Communication   Communication Communication: No apparent difficulties     Cognition Arousal: Alert Behavior During Therapy: WFL for tasks assessed/performed   PT - Cognitive impairments: No apparent impairments                         Following commands: Intact       Cueing       General Comments      Exercises     Assessment/Plan    PT Assessment Patient needs continued PT services  PT Problem List Decreased strength;Decreased activity tolerance;Decreased balance;Decreased mobility;Decreased safety awareness;Decreased knowledge of precautions;Decreased knowledge of use of DME;Cardiopulmonary status limiting activity       PT Treatment Interventions DME instruction;Gait training;Functional mobility training;Therapeutic activities;Therapeutic exercise;Balance training;Stair training;Neuromuscular re-education;Patient/family education    PT Goals (Current goals can be found in the Care Plan section)  Acute Rehab PT Goals Patient Stated Goal: to go home when ready PT Goal Formulation: With patient Time For Goal Achievement: 09/15/23 Potential to Achieve Goals: Good    Frequency Min 3X/week     Co-evaluation               AM-PAC PT 6 Clicks Mobility  Outcome Measure Help needed turning from your back to your side while in a flat bed without using bedrails?: A Little Help needed moving from lying on your back to sitting on the side of a flat bed without using bedrails?: A Little Help needed moving to and from a bed to a chair (including a wheelchair)?: A Little Help needed standing up from a chair using your arms (e.g., wheelchair or bedside chair)?: A Little Help needed to walk in hospital room?: A Little Help needed climbing 3-5 steps with a railing? : A Little 6 Click Score: 18    End of Session   Activity Tolerance: Patient tolerated treatment well Patient left: in bed;with call bell/phone within reach;with bed alarm set;with family/visitor present Nurse Communication: Mobility status PT Visit Diagnosis:  Unsteadiness on feet (R26.81);Muscle weakness (generalized) (M62.81)    Time: 8554-8491 PT Time Calculation (min) (ACUTE ONLY): 23 min   Charges:   PT Evaluation $PT Eval Moderate Complexity: 1 Mod PT Treatments $Therapeutic Activity: 8-22 mins PT General Charges $$ ACUTE PT VISIT: 1 Visit         Maryanne Finder, PT, DPT Physical Therapist - [facility]  [facility]   Dammon Makarewicz A Tamyah Cutbirth 09/01/2023, 3:52 PM

## 2023-09-02 DIAGNOSIS — I10 Essential (primary) hypertension: Secondary | ICD-10-CM | POA: Diagnosis not present

## 2023-09-02 DIAGNOSIS — E871 Hypo-osmolality and hyponatremia: Secondary | ICD-10-CM | POA: Diagnosis not present

## 2023-09-02 DIAGNOSIS — I160 Hypertensive urgency: Secondary | ICD-10-CM | POA: Diagnosis not present

## 2023-09-02 LAB — SODIUM: Sodium: 135 mmol/L (ref 135–145)

## 2023-09-02 MED ORDER — LEVOTHYROXINE SODIUM 25 MCG PO TABS: 25.0000 ug | ORAL_TABLET | Freq: Every day | ORAL | 0 refills | Status: AC

## 2023-09-02 MED ORDER — SENNOSIDES-DOCUSATE SODIUM 8.6-50 MG PO TABS
1.0000 | ORAL_TABLET | Freq: Once | ORAL | Status: AC
  Administered 2023-09-02 (×2): 1 via ORAL
  Filled 2023-09-02: qty 1

## 2023-09-02 MED ORDER — ENSURE PLUS HIGH PROTEIN PO LIQD
237.0000 mL | Freq: Two times a day (BID) | ORAL | Status: DC
  Administered 2023-09-02 (×2): 237 mL via ORAL

## 2023-09-02 MED ORDER — SUMATRIPTAN SUCCINATE 50 MG PO TABS: 50.0000 mg | ORAL_TABLET | Freq: Once | ORAL | 0 refills | Status: AC | PRN

## 2023-09-02 NOTE — Progress Notes (Signed)
 Pt being discharged with daughter at side. Pt c/o passing hard stool. Senna given as ordered. Pt alert and oriented. Able to make needs and concerns known. Pt able to ambulate with walker and 1 assist. Right arm IV removed and gauze dressing applied. Catheter and tip intact. Discharge paperwork explained and given to daughter. Both understands and will follow. Staff will assist to lobby.

## 2023-09-02 NOTE — Progress Notes (Signed)
 MEDICATION RELATED CONSULT NOTE - INITIAL   Pharmacy Consult for  Tolvaptan  monitoring  Indication: Hyponatremia   Allergies  Allergen Reactions   Penicillins     Has patient had a PCN reaction causing immediate rash, facial/tongue/throat swelling, SOB or lightheadedness with hypotension: no Has patient had a PCN reaction causing severe rash involving mucus membranes or skin necrosis: no Has patient had a PCN reaction that required hospitalization: no Has patient had a PCN reaction occurring within the last 10 years: no If all of the above answers are NO, then may proceed with Cephalosporin use.    Sulfa Antibiotics     UNKNOWN    Patient Measurements: Height: 4' 8 (142.2 cm) Weight: 69.9 kg (154 lb) IBW/kg (Calculated) : 36.3 Adjusted Body Weight:   Vital Signs: Temp: 98.7 F (37.1 C) (08/12 0218) Temp Source: Oral (08/12 0218) BP: 99/56 (08/12 0242) Pulse Rate: 77 (08/12 0218) Intake/Output from previous day: 08/11 0701 - 08/12 0700 In: 525 [P.O.:525] Out: 2925 [Urine:2925] Intake/Output from this shift: No intake/output data recorded.  Labs: Recent Labs    08/31/23 0951 08/31/23 1618 09/01/23 0831 09/01/23 1607  WBC 7.4  --  5.8  --   HGB 12.6  --  12.6  --   HCT 37.2  --  36.2  --   PLT 166  --  149*  --   CREATININE 0.79 1.11* 1.16*  --   ALBUMIN 4.0  --  3.9 3.6  PROT 6.8  --  6.4* 5.9*  AST 35  --  35 31  ALT 7  --  22 6  ALKPHOS 60  --  60 52  BILITOT 0.9  --  0.7 0.6  BILIDIR  --   --   --  <0.1  IBILI  --   --   --  NOT CALCULATED   Estimated Creatinine Clearance: 28.3 mL/min (A) (by C-G formula based on SCr of 1.16 mg/dL (H)).   Microbiology: No results found for this or any previous visit (from the past 720 hours).  Medical History: Past Medical History:  Diagnosis Date   Anemia    Aortic atherosclerosis (HCC)    Arthritis    Cardiomegaly    Cardiomyopathy (HCC)    Cholelithiasis    Chronic kidney disease, stage 2 (mild)     Chronic sinus bradycardia    Coronary artery disease 03/10/2012   a.) LHC 03/10/2012: 40% mLAD, 50% pRI, 50% pRCA, 90% mRCA, 50% dRCA, 50% RPDA - unable to cannulate RCA - med mgmt; b.) LHC/PCI 06/25/2013: 30% pLAD, 40% mLAD, 20% mLCx, 30% RI, 30% pRCA, 70% mRCA (2.5 mm Promus DES), 40% dRCA-1, 90% dRCA-2 (2.5 mm Promus DES)   DDD (degenerative disc disease), thoracolumbar    Diverticulosis    Dizziness    Environmental allergies    Full dentures    GERD (gastroesophageal reflux disease) 06/25/2013   H/O bilateral cataract extraction 2019   History of diverticulosis    History of kidney stones    HLD (hyperlipidemia)    Hyperlipidemia, unspecified    Hypertension    Nephrolithiasis    Osteoporosis, post-menopausal    Parkinson disease (HCC)    Pre-diabetes    PVC's (premature ventricular contractions)    Scoliosis    Thoracic kyphosis    Trifascicular bundle branch block    a.) 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 - Alert MD if >8 mEq/L increase in serum sodium in 8 hours    Gianni Mihalik D 09/02/2023,7:01 AM

## 2023-09-02 NOTE — Plan of Care (Signed)

## 2023-09-02 NOTE — Progress Notes (Signed)
 [facility]  ROUNDING NOTE   Subjective:   Patient seen sitting up in bed Alert and oriented Daughter at bedside Tolerating small meals  Sodium 135   Objective:  Vital signs in last 24 hours:  Temp:  [97.4 F (36.3 C)-99.3 F (37.4 C)] 99.3 F (37.4 C) (08/12 0828) Pulse Rate:  [67-80] 67 (08/12 0828) Resp:  [16-18] 16 (08/12 0828) BP: (99-147)/(37-79) 145/72 (08/12 0828) SpO2:  [94 %-100 %] 97 % (08/12 0828)  Weight change:  Filed Weights   08/30/23 0442  Weight: 69.9 kg    Intake/Output: I/O last 3 completed shifts: In: 2213.5 [P.O.:765; I.V.:1448.5] Out: 2925 [Urine:2925]   Intake/Output this shift:  No intake/output data recorded.  Physical Exam: General: NAD  Head: Normocephalic, atraumatic. Moist oral mucosal membranes  Eyes: Anicteric  Neck: Supple  Lungs:  Clear to auscultation, normal effort  Heart: Regular rate and rhythm  Abdomen:  Soft, nontender  Extremities:   No peripheral edema.  Neurologic: Awake, alert, conversant  Skin: Warm,dry, no rash       Basic Metabolic Panel: Recent Labs  Lab 08/30/23 0452 08/31/23 0951 08/31/23 1618 09/01/23 0831 09/01/23 2149 09/02/23 0639  NA 126* 123* 125* 125* 136 135  K 4.5 3.9 4.3 4.3  --   --   CL 91* 91* 91* 96*  --   --   CO2 25 22 19* 21*  --   --   GLUCOSE 106* 118* 93 103*  --   --   BUN 22 16 20 14   --   --   CREATININE 0.88 0.79 1.11* 1.16*  --   --   CALCIUM  9.9 8.9 9.2 8.9  --   --     Liver Function Tests: Recent Labs  Lab 08/31/23 0951 09/01/23 0831 09/01/23 1607  AST 35 35 31  ALT 7 22 6   ALKPHOS 60 60 52  BILITOT 0.9 0.7 0.6  PROT 6.8 6.4* 5.9*  ALBUMIN 4.0 3.9 3.6   No results for input(s): LIPASE, AMYLASE in the last 168 hours. No results for input(s): AMMONIA in the last 168 hours.  CBC: Recent Labs  Lab 08/30/23 0452 08/31/23 0951 09/01/23 0831  WBC 6.1 7.4 5.8  NEUTROABS 3.0 4.3 2.7  HGB 13.2 12.6 12.6  HCT 38.5 37.2 36.2  MCV 86.5  86.9 86.0  PLT 161 166 149*    Cardiac Enzymes: No results for input(s): CKTOTAL, CKMB, CKMBINDEX, TROPONINI in the last 168 hours.  BNP: Invalid input(s): POCBNP  CBG: No results for input(s): GLUCAP in the last 168 hours.  Microbiology: Results for orders placed or performed during the hospital encounter of 01/28/17  Urine Culture     Status: Abnormal   Collection Time: 01/28/17  7:30 PM   Specimen: Urine, Clean Catch  Result Value Ref Range Status   Specimen Description   Final    URINE, CLEAN CATCH Performed at [facility], [address], [place], [place] [address]    Special Requests   Final    NONE Performed at [facility], [address] [place], [place] [address]    Culture MULTIPLE SPECIES PRESENT, SUGGEST RECOLLECTION (A)  Final   Report Status 01/30/2017 FINAL  Final    Coagulation Studies: No results for input(s): LABPROT, INR in the last 72 hours.  Urinalysis: No results for input(s): COLORURINE, LABSPEC, PHURINE, GLUCOSEU, HGBUR, BILIRUBINUR, KETONESUR, PROTEINUR, UROBILINOGEN, NITRITE, LEUKOCYTESUR in the last 72 hours.  Invalid input(s): APPERANCEUR    Imaging: CT  CHEST W CONTRAST Result Date: 09/02/2023 EXAM: CT CHEST WITH CONTRAST 09/01/2023 10:49:38 PM TECHNIQUE: CT of the chest was performed with the administration of intravenous contrast. Multiplanar reformatted images are provided for review. Automated exposure control, iterative reconstruction, and/or weight based adjustment of the mA/kV was utilized to reduce the radiation dose to as low as reasonably achievable. CONTRAST: 75mL iohexol  (OMNIPAQUE ) 300 MG/ML solution COMPARISON: None available. CLINICAL HISTORY: Hyponatremia. History of hypertension and Parkinson's disease. FINDINGS: MEDIASTINUM: Mild cardiac enlargement. Trace pericardial effusion. Aortic atherosclerosis and coronary artery calcification. LYMPH NODES: No mediastinal,  hilar or axillary lymphadenopathy. LUNGS AND PLEURA: No pleural effusion or pneumothorax. No consolidative change. The central airways are grossly patent. Several calcified granulomas noted in the right upper lobe. SOFT TISSUES/BONES: There is mild thoracic scoliosis with multilevel degenerative disc disease. No acute or suspicious osseous findings. UPPER ABDOMEN: Calcification noted within the lateral dome of liver. A small cyst is noted within the central right lobe of the liver measuring 0.9 cm. IMPRESSION: 1. No acute abnormality and no findings to account for patient's symptoms hyponatremia. 2. Aortic atherosclerosis and coronary artery calcifications. Electronically signed by: Waddell Calk MD 09/02/2023 05:24 AM EDT RP Workstation: [id number]   DG Chest Port 1 View Result Date: 08/31/2023 CLINICAL DATA:  Hyponatremia. EXAM: PORTABLE CHEST 1 VIEW COMPARISON:  01/07/2022. FINDINGS: The heart is enlarged and the mediastinal contour is stable. There is atherosclerotic calcification of the aorta. No consolidation, effusion, or pneumothorax is seen. No acute osseous abnormality. IMPRESSION: No active disease. Electronically Signed   By: Leita Waddell M.D.   On: 08/31/2023 14:56     Medications:     aspirin  EC  81 mg Oral Daily   carbidopa -levodopa   1 tablet Oral QHS   carbidopa -levodopa   1 tablet Oral QID   enoxaparin  (LOVENOX ) injection  40 mg Subcutaneous Q24H   gabapentin   300 mg Oral Daily   levothyroxine   25 mcg Oral Q0600   loratadine   10 mg Oral Daily   losartan   50 mg Oral Nightly   pantoprazole   40 mg Oral BID AC   rosuvastatin   20 mg Oral QHS   sertraline   25 mg Oral QHS   butalbital -acetaminophen -caffeine , ondansetron  **OR** ondansetron  (ZOFRAN ) IV  Assessment/ Plan:  [name] is a 83 y.o.  female with past medical conditions including hypertension, CAD, GERD, parkinson's disease,, who was admitted to [facility] on 08/30/2023 for Hyponatremia [E87.1] Severe headache [R51.9] HTN  (hypertension), malignant [I10] Hypertensive urgency [I16.0]   Hyponatremia, acute. Sodium 126 on admission. Appears secondary to increased free water intake with decreased solute consumption. Initially treated with IV with little relief of symptoms. CT chest negative for malignancy. Received trial dose of Tolvaptan , sodium 135 today. Patient encouraged to limit fluid intake to 32-40oz per day. Encouraged to increase oral intake, will order Ensure supplementation.   Due to sodium correction, we will sign off at this time.      LOS: 2 Calvyn Kurtzman 8/12/202511:46 AM

## 2023-09-02 NOTE — Progress Notes (Signed)
 Physical Therapy Treatment Patient Details Name: Katelyn Crawford MRN: [id number] DOB: 03-24-1940 Today's Date: 09/02/2023   History of Present Illness 83 y/o female presented to ED on 08/30/23 for headache x 2 weeks. Admitted for HTN emergency. PMH: HTN, CAD, Parkinson's    PT Comments  Pt was long sitting in bed upon arrival. She is alert and agreeable. Supportive daughter (lives next door to pt) present. Pt was agreeable to session and remains cooperative. Overall endorses feeling much better than previous few days. She was able to exit bed, stand to RW, and tolerate ambulation with use of RW. Does have some unsteadiness noted. Encouraged pt to use RW at DC. Pt's daughter confirmed that pt will have 24/7 assistance at DC and prefer to DC directly home with HHPT. DC recs remain appropriate.     If plan is discharge home, recommend the following: A little help with walking and/or transfers;A little help with bathing/dressing/bathroom;Assistance with cooking/housework;Assist for transportation;Help with stairs or ramp for entrance     Equipment Recommendations  Rolling walker (2 wheels);BSC/3in1       Precautions / Restrictions Precautions Precautions: Fall Recall of Precautions/Restrictions: Intact Restrictions Weight Bearing Restrictions Per Provider Order: No     Mobility  Bed Mobility Overal bed mobility: Needs Assistance Bed Mobility: Supine to Sit, Sit to Supine  Supine to sit: Contact guard Sit to supine: Contact guard assist   Transfers Overall transfer level: Needs assistance Equipment used: None, Rolling walker (2 wheels) Transfers: Sit to/from Stand Sit to Stand: Contact guard assist   Ambulation/Gait Ambulation/Gait assistance: Contact guard assist Gait Distance (Feet): 60 Feet Assistive device: Rolling walker (2 wheels) Gait Pattern/deviations: Step-to pattern, Decreased stride length Gait velocity: decreased  General Gait Details: pt tolerated ambulation ~ 60 ft with  RW. CGA for safety. Both pt and daughter were encouraged to use RW at DC and have CGA for safety. both state confidence in DCing home with HHPT   Balance Overall balance assessment: Needs assistance Sitting-balance support: No upper extremity supported, Feet supported Sitting balance-Leahy Scale: Good     Standing balance support: Bilateral upper extremity supported, Reliant on assistive device for balance Standing balance-Leahy Scale: Fair Standing balance comment: reliant on RW for dynamic activity.      Communication Communication Communication: No apparent difficulties  Cognition Arousal: Alert Behavior During Therapy: WFL for tasks assessed/performed   PT - Cognitive impairments: No apparent impairments      PT - Cognition Comments: Pt is A and O x 3. supportive daughter present throughout session Following commands: Intact        PT Goals (current goals can now be found in the care plan section) Acute Rehab PT Goals Patient Stated Goal: to go home when ready Progress towards PT goals: Progressing toward goals    Frequency    Min 3X/week       AM-PAC PT 6 Clicks Mobility   Outcome Measure  Help needed turning from your back to your side while in a flat bed without using bedrails?: A Little Help needed moving from lying on your back to sitting on the side of a flat bed without using bedrails?: A Little Help needed moving to and from a bed to a chair (including a wheelchair)?: A Little Help needed standing up from a chair using your arms (e.g., wheelchair or bedside chair)?: A Little Help needed to walk in hospital room?: A Little Help needed climbing 3-5 steps with a railing? : A Little 6 Click Score: 18  End of Session   Activity Tolerance: Patient tolerated treatment well Patient left: in bed;with call bell/phone within reach;with bed alarm set;with family/visitor present Nurse Communication: Mobility status PT Visit Diagnosis: Unsteadiness on feet  (R26.81);Muscle weakness (generalized) (M62.81)     Time: 8959-8884 PT Time Calculation (min) (ACUTE ONLY): 35 min  Charges:    $Gait Training: 8-22 mins $Therapeutic Activity: 8-22 mins PT General Charges $$ ACUTE PT VISIT: 1 Visit                     Rankin Essex PTA 09/02/23, 1:18 PM

## 2023-09-02 NOTE — Discharge Summary (Signed)
 DISCHARGE SUMMARY    Katelyn Crawford FMW:[id number] DOB: May 19, 1940 DOA: 08/30/2023  PCP: Auston Reyes BIRCH, MD  Admit date: 08/30/2023 Discharge date: 09/02/2023   Recommendations for Outpatient Follow-up:  Follow up with PCP in 1 weeks to recheck BMP and ensure sodium level has stabilized. Follow-up with neurology as scheduled.  Patient has many questions about her Parkinson's medications and would like to discuss medication titration  Hospital Course: Katelyn Crawford patient is an 83 year old female with history of hypertension, Parkinson's disease, CAD status post stenting, chronic back pain, GERD, who presented with severe headache and uncontrolled hypertension.  Patient reported a dull headache in the occipital region which has been intermittent over the last 2 to 3 weeks but typically responsive to Tylenol .  The night prior to arrival she began having 10 out of 10 headache and some sensitivity to sound.  Her blood pressure was checked and noted to be SBP over 200 and thus family brought her to ED.  On arrival to ED blood pressure was 210/82, head CT negative for acute findings.  Labs revealed hyponatremia with a sodium of 126.  Patient received labetalol  x 1 in the ED and blood pressure resolved to 130/80.  She was admitted for further workup. She did not have significant improvement with fluid restriction.  Nephrology was consulted and gave 1 dose of tolvaptan .  Sodium quickly resolved to normal.  On 8/12 sodium was 135, patient had no further headache and blood pressure remained stable.  She will discharge home with close outpatient follow-up with her PCP.  Hypertensive urgency - Possibly some degree of vasospasm playing a role.  Blood pressure has been very labile.  But immediately responsive to labetalol  x 1 and later nifedipine . - Continue with home BP meds and monitor blood pressure closely. - Questionable if hypertensive urgency was actually secondary to headache instead of other way around.   Headache is now resolved   Headache - CT venogram unremarkable, CT head unremarkable - Slowly blood pressure related.  Trial of sumatriptan  hand outpatient if headache recurs - May be in some related to hyponatremia which is now resolved.  No headaches since resolution in sodium   Hyponatremia - Unclear etiology at this time.  Presented at 126, down trended to 123 - Clinically euvolemic, not on any diuretics - Resolved after 1 dose tolvaptan  - Is on low-dose sertraline , SSRIs can cause hyponatremia.  Is not a new medication for this patient, has been on this since at least 2016.  Consider this as an etiology if no other organic cause found and hyponatremia returns - Urine sodium: 46, urine osmole's: 283, osmolality 266. 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  CXR and CTA negative for lung pathology. 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  Though she is technically subclinical at this time this may be an explanation for her recurrent fatigue - Instructed patient she will need close outpatient follow-up to repeat these levels in the near future   Parkinson's disease - Continue Sinemet  at home dose - Outpatient follow-up with neurology, Dr. Maree   Chronic back pain - Continue home meds   CAD - No acute concerns.  Resume home meds Discharge Instructions  Discharge Instructions     Call MD for:  difficulty breathing, headache or visual disturbances   Complete by: As directed    Call MD for:  persistant dizziness or  light-headedness   Complete by: As directed    Call MD for:  persistant nausea and vomiting   Complete by: As directed    Call MD for:  severe uncontrolled pain   Complete by: As directed    Call MD for:  temperature >100.4   Complete by: As directed    Diet general   Complete by: As directed    Discharge instructions   Complete by: As directed    With  your primary care physician in 1 week to recheck your sodium levels.  Follow-up with neurology as scheduled.   Increase activity slowly   Complete by: As directed       Allergies as of 09/02/2023       Reactions   Penicillins    Has patient had a PCN reaction causing immediate rash, facial/tongue/throat swelling, SOB or lightheadedness with hypotension: no Has patient had a PCN reaction causing severe rash involving mucus membranes or skin necrosis: no Has patient had a PCN reaction that required hospitalization: no Has patient had a PCN reaction occurring within the last 10 years: no If all of the above answers are NO, then may proceed with Cephalosporin use.   Sulfa Antibiotics    UNKNOWN        Medication List     STOP taking these medications    Calcium -Magnesium -Zinc  Tabs   hydrALAZINE  25 MG tablet Commonly known as: APRESOLINE    ondansetron  4 MG tablet Commonly known as: ZOFRAN    OVER THE COUNTER MEDICATION       TAKE these medications    acetaminophen  500 MG tablet Commonly known as: TYLENOL  Take 500 mg by mouth every 8 (eight) hours as needed.   ascorbic acid  500 MG tablet Commonly known as: VITAMIN C  Take 500 mg by mouth daily.   aspirin  EC 81 MG tablet Take 81 mg by mouth daily.   B COMPLEX VITAMINS PO Take 1 tablet by mouth daily.   calcium  carbonate 1250 (500 Ca) MG tablet Commonly known as: OS-CAL - dosed in mg of elemental calcium  Take 625 mg by mouth 2 (two) times daily with a meal.   carbidopa -levodopa  25-250 MG tablet Commonly known as: SINEMET  IR Take 1 tablet by mouth 4 (four) times daily. 0700, 1100, 1500, and 1900   carbidopa -levodopa  50-200 MG tablet Commonly known as: SINEMET  CR Take 1 tablet by mouth at bedtime.   cetirizine 10 MG tablet Commonly known as: ZYRTEC Take 10 mg by mouth at bedtime.   fluticasone  50 MCG/ACT nasal spray Commonly known as: FLONASE  Place 2 sprays into both nostrils daily. What changed:   when to take this reasons to take this   gabapentin  300 MG capsule Commonly known as: NEURONTIN  Take 300 mg by mouth daily. What changed: Another medication with the same name was removed. Continue taking this medication, and follow the directions you see here.   levothyroxine  25 MCG tablet Commonly known as: SYNTHROID  Take 1 tablet (25 mcg total) by mouth daily at 6 (six) AM. Start taking on: September 03, 2023   losartan  25 MG tablet Commonly known as: COZAAR  Take 50 mg by mouth Nightly.   magnesium  sulfate 500 MG/ML Soln oral syringe Take 250 mg by mouth daily.   Multivitamin Adult Tabs Take 1 tablet by mouth 2 (two) times daily.   nitroGLYCERIN 0.4 MG SL tablet Commonly known as: NITROSTAT Place 0.4 mg under the tongue every 5 (five) minutes as needed for chest pain.   pantoprazole  40 MG tablet Commonly  known as: PROTONIX  Take 40 mg by mouth 2 (two) times daily before a meal.   rosuvastatin  20 MG tablet Commonly known as: CRESTOR  Take 20 mg by mouth at bedtime.   sertraline  50 MG tablet Commonly known as: ZOLOFT  Take 25 mg by mouth at bedtime.   SUMAtriptan  50 MG tablet Commonly known as: Imitrex  Take 1 tablet (50 mg total) by mouth once as needed for migraine. May repeat in 2 hours if headache persists or recurs. Do not exceed 100mg  in a day.   traMADol  50 MG tablet Commonly known as: ULTRAM  Take 25-50 mg by mouth at bedtime.   Vitamin D -3 25 MCG (1000 UT) Caps Take 1,000 Int'l Units by mouth daily.        Allergies  Allergen Reactions   Penicillins     Has patient had a PCN reaction causing immediate rash, facial/tongue/throat swelling, SOB or lightheadedness with hypotension: no Has patient had a PCN reaction causing severe rash involving mucus membranes or skin necrosis: no Has patient had a PCN reaction that required hospitalization: no Has patient had a PCN reaction occurring within the last 10 years: no If all of the above answers are NO, then  may proceed with Cephalosporin use.    Sulfa Antibiotics     UNKNOWN    Consultations: Treatment Team:  Lateef, Munsoor, MD   Procedures/Studies: CT CHEST W CONTRAST Result Date: 09/02/2023 EXAM: CT CHEST WITH CONTRAST 09/01/2023 10:49:38 PM TECHNIQUE: CT of the chest was performed with the administration of intravenous contrast. Multiplanar reformatted images are provided for review. Automated exposure control, iterative reconstruction, and/or weight based adjustment of the mA/kV was utilized to reduce the radiation dose to as low as reasonably achievable. CONTRAST: 75mL iohexol  (OMNIPAQUE ) 300 MG/ML solution COMPARISON: None available. CLINICAL HISTORY: Hyponatremia. History of hypertension and Parkinson's disease. FINDINGS: MEDIASTINUM: Mild cardiac enlargement. Trace pericardial effusion. Aortic atherosclerosis and coronary artery calcification. LYMPH NODES: No mediastinal, hilar or axillary lymphadenopathy. LUNGS AND PLEURA: No pleural effusion or pneumothorax. No consolidative change. The central airways are grossly patent. Several calcified granulomas noted in the right upper lobe. SOFT TISSUES/BONES: There is mild thoracic scoliosis with multilevel degenerative disc disease. No acute or suspicious osseous findings. UPPER ABDOMEN: Calcification noted within the lateral dome of liver. A small cyst is noted within the central right lobe of the liver measuring 0.9 cm. IMPRESSION: 1. No acute abnormality and no findings to account for patient's symptoms hyponatremia. 2. Aortic atherosclerosis and coronary artery calcifications. Electronically signed by: Waddell Calk MD 09/02/2023 05:24 AM EDT RP Workstation: [id number]   DG Chest Port 1 View Result Date: 08/31/2023 CLINICAL DATA:  Hyponatremia. EXAM: PORTABLE CHEST 1 VIEW COMPARISON:  01/07/2022. FINDINGS: The heart is enlarged and the mediastinal contour is stable. There is atherosclerotic calcification of the aorta. No consolidation,  effusion, or pneumothorax is seen. No acute osseous abnormality. IMPRESSION: No active disease. Electronically Signed   By: Leita Waddell M.D.   On: 08/31/2023 14:56   CT VENOGRAM HEAD Result Date: 08/30/2023 EXAM: CT VENOGRAM WITH CONTAST 08/30/2023 09:42:20 AM TECHNIQUE: CT venogram of the head/brain was performed with the administration of intravenous contrast. Multiplanar reformatted images are provided for review. MIP images are provided for review. Automated exposure control, iterative reconstruction, and/or weight based adjustment of the mA/kV was utilized to reduce the radiation dose to as low as reasonably achievable. COMPARISON: CT head without contrast 08/30/2023 at 04:53 AM. MR head without contrast 10/08/2022. CLINICAL HISTORY: Headache, sudden, severe. FINDINGS: BRAIN/VENTRICLES: No acute  intracranial hemorrhage. No extra axial fluid collection. Gray-white differentiation is maintained. No mass effect or midline shift. No hydrocephalus. Mild periventricular white matter changes are stable. ORBITS: No acute abnormality. SINUSES AND MASTOIDS: No acute abnormality. SOFT TISSUES AND SKULL: No acute abnormality. CT VENOGRAM: No dural venous sinus thrombosis. No significant stenosis. Atherosclerotic calcifications are again noted within the cavernous internal carotid arteries and at the drill margin of the left vertebral artery. No focal stenosis or branch vessel occlusion is present within the Circle of Willis. IMPRESSION: 1. No acute intracranial abnormality. 2. No dural venous sinus thrombosis. Electronically signed by: Lonni Necessary MD 08/30/2023 10:12 AM EDT RP Workstation: [id number]   CT Head Wo Contrast Result Date: 08/30/2023 EXAM: CT HEAD WITHOUT CONTRAST 08/30/2023 04:58:24 AM TECHNIQUE: CT of the head was performed without the administration of intravenous contrast. Automated exposure control, iterative reconstruction, and/or weight based adjustment of the mA/kV was utilized to reduce  the radiation dose to as low as reasonably achievable. COMPARISON: 10/08/2022 CLINICAL HISTORY: Headache, new onset (Age >= 51y). Patient brought in by EMS, coming from home with headache for 2 weeks. Pressure all over head today. Patient found today with BP 215/90, HR 80, 85 BG. FINDINGS: BRAIN AND VENTRICLES: No acute hemorrhage. Gray-white differentiation is preserved. No hydrocephalus. No extra-axial collection. No mass effect or midline shift. Mild periventricular white matter hypoattenuation is similar to the prior exam, likely reflecting the sequelae of chronic microvascular ischemia. Normal lacunar infarct is present in the posterior right cerebellum. ORBITS: Bilateral lens replacements are noted. The globes and orbits are otherwise within normal limits. SINUSES: No acute abnormality. SOFT TISSUES AND SKULL: No acute soft tissue abnormality. No skull fracture. Atherosclerotic calcifications are present in the cavernous carotid arteries bilaterally and at the dural margin of both vertebral arteries. No hyperdense vessel is present. IMPRESSION: 1. No acute intracranial abnormality related to the new onset headache. 2. Mild periventricular white matter hypoattenuation, similar to the prior exam, likely reflecting the sequelae of chronic microvascular ischemia. 3. Atherosclerotic calcifications in the cavernous carotid arteries bilaterally and at the dural margin of both vertebral arteries. No hyperdense vessel is present. Electronically signed by: Lonni Necessary MD 08/30/2023 05:13 AM EDT RP Workstation: [id number]      Discharge Exam: Vitals:   09/02/23 0242 09/02/23 0828  BP: (!) 99/56 (!) 145/72  Pulse:  67  Resp:  16  Temp:  99.3 F (37.4 C)  SpO2:  97%   Vitals:   09/01/23 2036 09/02/23 0218 09/02/23 0242 09/02/23 0828  BP: (!) 117/52 (!) 99/37 (!) 99/56 (!) 145/72  Pulse: 80 77  67  Resp: 18 18  16   Temp: (!) 97.4 F (36.3 C) 98.7 F (37.1 C)  99.3 F (37.4 C)  TempSrc: Oral  Oral  Oral  SpO2: 100% 94%  97%  Weight:      Height:        Constitutional:  Normal appearance. Non toxic-appearing.  HENT: Head Normocephalic and atraumatic.  Mucous membranes are moist.  Eyes:  Extraocular intact. Conjunctivae normal.  Cardiovascular: Rate and Rhythm: Normal rate and regular rhythm.  Pulmonary: Non labored, symmetric rise of chest wall.  Skin: warm and dry. not jaundiced.  Neurological: No focal deficit present. alert. Oriented.  Psychiatric: Mood and Affect congruent.    The results of significant diagnostics from this hospitalization (including imaging, microbiology, ancillary and laboratory) are listed below for reference.     Microbiology: No results found for this or any previous visit (from the  past 240 hours).   Labs: BNP (last 3 results) No results for input(s): BNP in the last 8760 hours. Basic Metabolic Panel: Recent Labs  Lab 08/30/23 0452 08/31/23 0951 08/31/23 1618 09/01/23 0831 09/01/23 2149 09/02/23 0639  NA 126* 123* 125* 125* 136 135  K 4.5 3.9 4.3 4.3  --   --   CL 91* 91* 91* 96*  --   --   CO2 25 22 19* 21*  --   --   GLUCOSE 106* 118* 93 103*  --   --   BUN 22 16 20 14   --   --   CREATININE 0.88 0.79 1.11* 1.16*  --   --   CALCIUM  9.9 8.9 9.2 8.9  --   --    Liver Function Tests: Recent Labs  Lab 08/31/23 0951 09/01/23 0831 09/01/23 1607  AST 35 35 31  ALT 7 22 6   ALKPHOS 60 60 52  BILITOT 0.9 0.7 0.6  PROT 6.8 6.4* 5.9*  ALBUMIN 4.0 3.9 3.6   No results for input(s): LIPASE, AMYLASE in the last 168 hours. No results for input(s): AMMONIA in the last 168 hours. CBC: Recent Labs  Lab 08/30/23 0452 08/31/23 0951 09/01/23 0831  WBC 6.1 7.4 5.8  NEUTROABS 3.0 4.3 2.7  HGB 13.2 12.6 12.6  HCT 38.5 37.2 36.2  MCV 86.5 86.9 86.0  PLT 161 166 149*   Cardiac Enzymes: No results for input(s): CKTOTAL, CKMB, CKMBINDEX, TROPONINI in the last 168 hours. BNP: Invalid input(s): POCBNP CBG: No  results for input(s): GLUCAP in the last 168 hours. D-Dimer No results for input(s): DDIMER in the last 72 hours. Hgb A1c No results for input(s): HGBA1C in the last 72 hours. Lipid Profile No results for input(s): CHOL, HDL, LDLCALC, TRIG, CHOLHDL, LDLDIRECT in the last 72 hours. Thyroid function studies No results for input(s): TSH, T4TOTAL, T3FREE, THYROIDAB in the last 72 hours.  Invalid input(s): FREET3 Anemia work up No results for input(s): VITAMINB12, FOLATE, FERRITIN, TIBC, IRON, RETICCTPCT in the last 72 hours. Urinalysis    Component Value Date/Time   COLORURINE AMBER (A) 01/07/2022 1826   APPEARANCEUR CLOUDY (A) 01/07/2022 1826   LABSPEC 1.010 01/07/2022 1826   PHURINE 7.0 01/07/2022 1826   GLUCOSEU NEGATIVE 01/07/2022 1826   HGBUR NEGATIVE 01/07/2022 1826   BILIRUBINUR NEGATIVE 01/07/2022 1826   KETONESUR 5 (A) 01/07/2022 1826   PROTEINUR NEGATIVE 01/07/2022 1826   NITRITE NEGATIVE 01/07/2022 1826   LEUKOCYTESUR NEGATIVE 01/07/2022 1826   Sepsis Labs Recent Labs  Lab 08/30/23 0452 08/31/23 0951 09/01/23 0831  WBC 6.1 7.4 5.8   Microbiology No results found for this or any previous visit (from the past 240 hours).   Time coordinating discharge: 32 min   SIGNED: Bufford Helms, DO [facility] 09/02/2023, 4:48 PM Pager   If 7PM-7AM, please contact night-coverage

## 2023-09-03 ENCOUNTER — Ambulatory Visit: Payer: Medicare Other | Admitting: Physical Therapy

## 2023-09-03 ENCOUNTER — Encounter

## 2023-09-08 ENCOUNTER — Encounter

## 2023-09-08 ENCOUNTER — Ambulatory Visit: Payer: Medicare Other | Admitting: Physical Therapy

## 2023-09-10 ENCOUNTER — Ambulatory Visit: Payer: Medicare Other | Admitting: Physical Therapy

## 2023-09-10 ENCOUNTER — Encounter

## 2023-09-15 ENCOUNTER — Encounter

## 2023-09-15 ENCOUNTER — Ambulatory Visit: Payer: Medicare Other | Admitting: Physical Therapy

## 2023-09-17 ENCOUNTER — Ambulatory Visit: Admitting: Physical Therapy

## 2023-09-17 ENCOUNTER — Encounter

## 2023-09-24 ENCOUNTER — Encounter

## 2023-09-24 ENCOUNTER — Ambulatory Visit: Admitting: Physical Therapy

## 2023-09-29 ENCOUNTER — Encounter

## 2023-09-29 ENCOUNTER — Ambulatory Visit: Admitting: Physical Therapy

## 2023-10-01 ENCOUNTER — Ambulatory Visit: Admitting: Physical Therapy

## 2023-10-01 ENCOUNTER — Encounter

## 2023-10-06 ENCOUNTER — Ambulatory Visit: Admitting: Physical Therapy

## 2023-10-06 ENCOUNTER — Encounter

## 2023-10-07 ENCOUNTER — Emergency Department

## 2023-10-07 ENCOUNTER — Other Ambulatory Visit: Payer: Self-pay

## 2023-10-07 ENCOUNTER — Observation Stay
Admission: EM | Admit: 2023-10-07 | Discharge: 2023-10-08 | Disposition: A | Discharge status: Home / Self Care | Attending: Emergency Medicine | Admitting: Emergency Medicine

## 2023-10-07 DIAGNOSIS — Z96652 Presence of left artificial knee joint: Secondary | ICD-10-CM | POA: Insufficient documentation

## 2023-10-07 DIAGNOSIS — G20A1 Parkinson's disease without dyskinesia, without mention of fluctuations: Secondary | ICD-10-CM | POA: Diagnosis present

## 2023-10-07 DIAGNOSIS — I129 Hypertensive chronic kidney disease with stage 1 through stage 4 chronic kidney disease, or unspecified chronic kidney disease: Secondary | ICD-10-CM | POA: Diagnosis not present

## 2023-10-07 DIAGNOSIS — K219 Gastro-esophageal reflux disease without esophagitis: Secondary | ICD-10-CM | POA: Diagnosis not present

## 2023-10-07 DIAGNOSIS — G20C Parkinsonism, unspecified: Secondary | ICD-10-CM | POA: Insufficient documentation

## 2023-10-07 DIAGNOSIS — Z79899 Other long term (current) drug therapy: Secondary | ICD-10-CM | POA: Diagnosis not present

## 2023-10-07 DIAGNOSIS — E782 Mixed hyperlipidemia: Secondary | ICD-10-CM | POA: Diagnosis not present

## 2023-10-07 DIAGNOSIS — I2510 Atherosclerotic heart disease of native coronary artery without angina pectoris: Secondary | ICD-10-CM | POA: Insufficient documentation

## 2023-10-07 DIAGNOSIS — Z7982 Long term (current) use of aspirin: Secondary | ICD-10-CM | POA: Diagnosis not present

## 2023-10-07 DIAGNOSIS — N182 Chronic kidney disease, stage 2 (mild): Secondary | ICD-10-CM | POA: Diagnosis not present

## 2023-10-07 DIAGNOSIS — G44319 Acute post-traumatic headache, not intractable: Secondary | ICD-10-CM | POA: Diagnosis not present

## 2023-10-07 DIAGNOSIS — I10 Essential (primary) hypertension: Principal | ICD-10-CM | POA: Diagnosis present

## 2023-10-07 DIAGNOSIS — R519 Headache, unspecified: Secondary | ICD-10-CM

## 2023-10-07 DIAGNOSIS — I160 Hypertensive urgency: Principal | ICD-10-CM | POA: Diagnosis present

## 2023-10-07 DIAGNOSIS — R030 Elevated blood-pressure reading, without diagnosis of hypertension: Secondary | ICD-10-CM | POA: Diagnosis present

## 2023-10-07 LAB — URINALYSIS, ROUTINE W REFLEX MICROSCOPIC
Bilirubin Urine: NEGATIVE
Glucose, UA: NEGATIVE mg/dL
Hgb urine dipstick: NEGATIVE
Ketones, ur: NEGATIVE mg/dL
Leukocytes,Ua: NEGATIVE
Nitrite: NEGATIVE
Protein, ur: NEGATIVE mg/dL
Specific Gravity, Urine: 1.009 (ref 1.005–1.030)
pH: 7 (ref 5.0–8.0)

## 2023-10-07 LAB — BASIC METABOLIC PANEL WITH GFR
Anion gap: 10 (ref 5–15)
BUN: 16 mg/dL (ref 8–23)
CO2: 23 mmol/L (ref 22–32)
Calcium: 9.6 mg/dL (ref 8.9–10.3)
Chloride: 98 mmol/L (ref 98–111)
Creatinine, Ser: 0.94 mg/dL (ref 0.44–1.00)
GFR, Estimated: 60 mL/min — ABNORMAL LOW (ref >60)
Glucose, Bld: 107 mg/dL — ABNORMAL HIGH (ref 70–99)
Potassium: 4.4 mmol/L (ref 3.5–5.1)
Sodium: 131 mmol/L — ABNORMAL LOW (ref 135–145)

## 2023-10-07 LAB — CBC WITH DIFFERENTIAL/PLATELET
Abs Immature Granulocytes: 0.02 K/uL (ref 0.00–0.07)
Basophils Absolute: 0.1 K/uL (ref 0.0–0.1)
Basophils Relative: 1 %
Eosinophils Absolute: 0.2 K/uL (ref 0.0–0.5)
Eosinophils Relative: 3 %
HCT: 40.7 % (ref 36.0–46.0)
Hemoglobin: 13.4 g/dL (ref 12.0–15.0)
Immature Granulocytes: 0 %
Lymphocytes Relative: 32 %
Lymphs Abs: 2.2 K/uL (ref 0.7–4.0)
MCH: 29.3 pg (ref 26.0–34.0)
MCHC: 32.9 g/dL (ref 30.0–36.0)
MCV: 89.1 fL (ref 80.0–100.0)
Monocytes Absolute: 0.6 K/uL (ref 0.1–1.0)
Monocytes Relative: 9 %
Neutro Abs: 3.9 K/uL (ref 1.7–7.7)
Neutrophils Relative %: 55 %
Platelets: 145 K/uL — ABNORMAL LOW (ref 150–400)
RBC: 4.57 MIL/uL (ref 3.87–5.11)
RDW: 14.1 % (ref 11.5–15.5)
WBC: 7 K/uL (ref 4.0–10.5)
nRBC: 0 % (ref 0.0–0.2)

## 2023-10-07 LAB — HEPATIC FUNCTION PANEL
ALT: 21 U/L (ref 0–44)
AST: 28 U/L (ref 15–41)
Albumin: 3.9 g/dL (ref 3.5–5.0)
Alkaline Phosphatase: 50 U/L (ref 38–126)
Bilirubin, Direct: 0.1 mg/dL (ref 0.0–0.2)
Indirect Bilirubin: 0.6 mg/dL (ref 0.3–0.9)
Total Bilirubin: 0.7 mg/dL (ref 0.0–1.2)
Total Protein: 6.2 g/dL — ABNORMAL LOW (ref 6.5–8.1)

## 2023-10-07 LAB — RESP PANEL BY RT-PCR (RSV, FLU A&B, COVID)  RVPGX2
Influenza A by PCR: NEGATIVE
Influenza B by PCR: NEGATIVE
Resp Syncytial Virus by PCR: NEGATIVE
SARS Coronavirus 2 by RT PCR: NEGATIVE

## 2023-10-07 LAB — LACTIC ACID, PLASMA: Lactic Acid, Venous: 1.7 mmol/L (ref 0.5–1.9)

## 2023-10-07 LAB — TROPONIN I (HIGH SENSITIVITY): Troponin I (High Sensitivity): 10 ng/L (ref <18)

## 2023-10-07 MED ORDER — ACETAMINOPHEN 325 MG PO TABS
650.0000 mg | ORAL_TABLET | Freq: Once | ORAL | Status: AC
  Administered 2023-10-07: 650 mg via ORAL
  Filled 2023-10-07: qty 2

## 2023-10-07 MED ORDER — LABETALOL HCL 5 MG/ML IV SOLN
5.0000 mg | Freq: Once | INTRAVENOUS | Status: AC
  Administered 2023-10-07: 5 mg via INTRAVENOUS
  Filled 2023-10-07: qty 4

## 2023-10-07 MED ORDER — LABETALOL HCL 5 MG/ML IV SOLN
10.0000 mg | Freq: Once | INTRAVENOUS | Status: AC
  Administered 2023-10-07: 10 mg via INTRAVENOUS
  Filled 2023-10-07: qty 4

## 2023-10-07 MED ORDER — POLYETHYLENE GLYCOL 3350 17 G PO PACK: 17.0000 g | PACK | Freq: Every day | ORAL | Status: DC | PRN

## 2023-10-07 MED ORDER — GABAPENTIN 300 MG PO CAPS
300.0000 mg | ORAL_CAPSULE | Freq: Every day | ORAL | Status: DC
  Administered 2023-10-07: 300 mg via ORAL
  Filled 2023-10-07: qty 1

## 2023-10-07 MED ORDER — LEVOTHYROXINE SODIUM 25 MCG PO TABS
25.0000 ug | ORAL_TABLET | Freq: Every day | ORAL | Status: DC
  Administered 2023-10-08: 25 ug via ORAL
  Filled 2023-10-07: qty 1

## 2023-10-07 MED ORDER — ENOXAPARIN SODIUM 40 MG/0.4ML IJ SOSY: 40.0000 mg | PREFILLED_SYRINGE | INTRAMUSCULAR | Status: DC

## 2023-10-07 MED ORDER — ONDANSETRON HCL 4 MG/2ML IJ SOLN: 4.0000 mg | Freq: Four times a day (QID) | INTRAMUSCULAR | Status: DC | PRN

## 2023-10-07 MED ORDER — HYDRALAZINE HCL 20 MG/ML IJ SOLN: 10.0000 mg | Freq: Four times a day (QID) | INTRAMUSCULAR | Status: DC | PRN

## 2023-10-07 MED ORDER — LOSARTAN POTASSIUM 50 MG PO TABS
50.0000 mg | ORAL_TABLET | Freq: Every evening | ORAL | Status: DC
  Administered 2023-10-07: 50 mg via ORAL
  Filled 2023-10-07: qty 1

## 2023-10-07 MED ORDER — DIPHENHYDRAMINE HCL 50 MG/ML IJ SOLN
12.5000 mg | Freq: Once | INTRAMUSCULAR | Status: AC
  Administered 2023-10-07: 12.5 mg via INTRAVENOUS
  Filled 2023-10-07: qty 1

## 2023-10-07 MED ORDER — METOCLOPRAMIDE HCL 5 MG/ML IJ SOLN
10.0000 mg | Freq: Once | INTRAMUSCULAR | Status: AC
  Administered 2023-10-07: 10 mg via INTRAVENOUS
  Filled 2023-10-07: qty 2

## 2023-10-07 MED ORDER — PANTOPRAZOLE SODIUM 40 MG PO TBEC
40.0000 mg | DELAYED_RELEASE_TABLET | Freq: Two times a day (BID) | ORAL | Status: DC
  Administered 2023-10-07 – 2023-10-08 (×2): 40 mg via ORAL
  Filled 2023-10-07 (×2): qty 1

## 2023-10-07 MED ORDER — CARBIDOPA-LEVODOPA ER 50-200 MG PO TBCR
1.0000 | EXTENDED_RELEASE_TABLET | Freq: Two times a day (BID) | ORAL | Status: DC
  Filled 2023-10-07: qty 1

## 2023-10-07 MED ORDER — B COMPLEX-C PO TABS
1.0000 | ORAL_TABLET | Freq: Every day | ORAL | Status: DC
  Administered 2023-10-07 – 2023-10-08 (×2): 1 via ORAL
  Filled 2023-10-07 (×2): qty 1

## 2023-10-07 MED ORDER — CARBIDOPA-LEVODOPA 25-250 MG PO TABS
1.0000 | ORAL_TABLET | Freq: Four times a day (QID) | ORAL | Status: DC
  Administered 2023-10-07 – 2023-10-08 (×4): 1 via ORAL
  Filled 2023-10-07 (×4): qty 1

## 2023-10-07 MED ORDER — ONDANSETRON HCL 4 MG PO TABS: 4.0000 mg | ORAL_TABLET | Freq: Four times a day (QID) | ORAL | Status: DC | PRN

## 2023-10-07 MED ORDER — ASPIRIN 81 MG PO TBEC
81.0000 mg | DELAYED_RELEASE_TABLET | Freq: Every day | ORAL | Status: DC
  Administered 2023-10-07 – 2023-10-08 (×2): 81 mg via ORAL
  Filled 2023-10-07 (×2): qty 1

## 2023-10-07 MED ORDER — CARBIDOPA-LEVODOPA ER 50-200 MG PO TBCR
1.0000 | EXTENDED_RELEASE_TABLET | Freq: Every day | ORAL | Status: DC
  Administered 2023-10-07: 1 via ORAL
  Filled 2023-10-07: qty 1

## 2023-10-07 NOTE — H&P (Signed)
 History and Physical    Katelyn Crawford FMW:[id number] DOB: 08/17/1940 DOA: 10/07/2023  DOS: the patient was seen and examined on 10/07/2023  PCP: Auston Reyes BIRCH, MD   Patient coming from: Home  I have personally briefly reviewed patient's old medical records in [facility]  Chief Complaint: Uncontrolled BP/Headache  HPI: Katelyn Crawford is a pleasant 83 y.o. female with medical history significant for CAD s/p stent, Parkinson's disease, HTN, HLD who was brought in to ED by EMS for uncontrolled blood pressure.  Patient also complained of headache and upper back burning.  Blood pressure with EMS was 230/90.  Patient and daughter stated that patient had been ongoing intermittent uncontrolled hypertension and initially prescribed hydralazine  to take 1 tablet if the blood pressure is 170.  That medication has expired and she does not have anything to take at home.  Patient also complained of some frontal headache but improved after blood pressure is improved.  She denies any chest pain, palpitations, abdominal pain, nausea, vomiting.  She denied any dizziness. She had similar admission to hospitalist service within a month ago for severe headache and uncontrolled hypertension along with hyponatremia.  ED Course: Upon arrival to the ED, patient is found to be hypertensive, EKG with no new findings, CT head negative for acute pathology.  There was no acute signs of infection.  Patient received labetalol  injection 10 mg one-time, Tylenol , Reglan , Benadryl , 1 more gram of labetalol .  Patient feels much better but is still not at the baseline.  Hospitalist service was consulted for evaluation for admission for uncontrolled hypertension with headache.  Review of Systems:  ROS  All other systems negative except as noted in the HPI.  Past Medical History:  Diagnosis Date   Anemia    Aortic atherosclerosis (HCC)    Arthritis    Cardiomegaly    Cardiomyopathy (HCC)    Cholelithiasis    Chronic  kidney disease, stage 2 (mild)    Chronic sinus bradycardia    Coronary artery disease 03/10/2012   a.) LHC 03/10/2012: 40% mLAD, 50% pRI, 50% pRCA, 90% mRCA, 50% dRCA, 50% RPDA - unable to cannulate RCA - med mgmt; b.) LHC/PCI 06/25/2013: 30% pLAD, 40% mLAD, 20% mLCx, 30% RI, 30% pRCA, 70% mRCA (2.5 mm Promus DES), 40% dRCA-1, 90% dRCA-2 (2.5 mm Promus DES)   DDD (degenerative disc disease), thoracolumbar    Diverticulosis    Dizziness    Environmental allergies    Full dentures    GERD (gastroesophageal reflux disease) 06/25/2013   H/O bilateral cataract extraction 2019   History of diverticulosis    History of kidney stones    HLD (hyperlipidemia)    Hyperlipidemia, unspecified    Hypertension    Nephrolithiasis    Osteoporosis, post-menopausal    Parkinson disease (HCC)    Pre-diabetes    PVC's (premature ventricular contractions)    Scoliosis    Thoracic kyphosis    Trifascicular bundle branch block    a.) 1st degree AVB + RBBB + LAFB   Vascular disease     Past Surgical History:  Procedure Laterality Date   APPENDECTOMY     BREAST EXCISIONAL BIOPSY Left 1984   neg   BREAST EXCISIONAL BIOPSY Right 1997   lipoma   CATARACT EXTRACTION W/PHACO Left 09/02/2017   Procedure: CATARACT EXTRACTION PHACO AND INTRAOCULAR LENS PLACEMENT (IOC)  LEFT;  Surgeon: Mittie Gaskin, MD;  Location: [facility];  Service: Ophthalmology;  Laterality: Left;   CATARACT EXTRACTION W/PHACO  Right 10/01/2017   Procedure: CATARACT EXTRACTION PHACO AND INTRAOCULAR LENS PLACEMENT (IOC) RIGHT;  Surgeon: Mittie Gaskin, MD;  Location: [facility];  Service: Ophthalmology;  Laterality: Right;   COLONOSCOPY WITH PROPOFOL  N/A 11/27/2016   Procedure: COLONOSCOPY WITH PROPOFOL ;  Surgeon: Toledo, Ladell POUR, MD;  Location: [facility];  Service: Gastroenterology;  Laterality: N/A;   CORONARY ANGIOPLASTY WITH STENT PLACEMENT Left 06/25/2013   Procedure: CORONARY ANGIOPLASTY WITH  STENT PLACEMENT; Location: [facility]; Surgeon: Dayton Dash, MD   KNEE ARTHROPLASTY Left 01/22/2017   Procedure: COMPUTER ASSISTED TOTAL KNEE ARTHROPLASTY;  Surgeon: Mardee Lynwood SQUIBB, MD;  Location: [facility];  Service: Orthopedics;  Laterality: Left;   KNEE ARTHROSCOPY Left 02/01/2015   Procedure: ARTHROSCOPY KNEE, partial medial & lateral menisectomy,,medial and patelofemoral chondroplasty;  Surgeon: Lynwood SQUIBB Mardee, MD;  Location: [facility];  Service: Orthopedics;  Laterality: Left;   LEFT HEART CATH AND CORONARY ANGIOGRAPHY Left 03/10/2012   Procedure: LEFT HEART CATH AND CORONARY ANGIOGRAPHY; Location: [facility]; Surgeon: Marsa Dooms, MD   LITHOTRIPSY Right 1995   RIGHT AXILLARY LIPOMA REMOVAL     TUBAL LIGATION     VARICOSE VEIN SURGERY       reports that she has never smoked. She has never used smokeless tobacco. She reports that she does not drink alcohol and does not use drugs.  Allergies  Allergen Reactions   Penicillins     Has patient had a PCN reaction causing immediate rash, facial/tongue/throat swelling, SOB or lightheadedness with hypotension: no Has patient had a PCN reaction causing severe rash involving mucus membranes or skin necrosis: no Has patient had a PCN reaction that required hospitalization: no Has patient had a PCN reaction occurring within the last 10 years: no If all of the above answers are NO, then may proceed with Cephalosporin use.    Sulfa Antibiotics     UNKNOWN    Family History  Problem Relation Age of Onset   Hypertension Mother    Congestive Heart Failure Father    Coronary artery disease Father    Coronary artery disease Son        STEMI w/ PCI   Breast cancer Neg Hx    Colon cancer Neg Hx    Colon polyps Neg Hx     Prior to Admission medications   Medication Sig Start Date End Date Taking? Authorizing Provider  acetaminophen  (TYLENOL ) 500 MG tablet Take 500 mg by mouth every 8 (eight) hours as needed.    [provider]   aspirin  EC 81 MG tablet Take 81 mg by mouth daily.    [provider]  B COMPLEX VITAMINS PO Take 1 tablet by mouth daily.    [provider]  calcium  carbonate (OS-CAL - DOSED IN MG OF ELEMENTAL CALCIUM ) 1250 (500 Ca) MG tablet Take 625 mg by mouth 2 (two) times daily with a meal.    [provider]  carbidopa -levodopa  (SINEMET  CR) 50-200 MG tablet Take 1 tablet by mouth at bedtime.    [provider]  carbidopa -levodopa  (SINEMET  IR) 25-250 MG tablet Take 1 tablet by mouth 4 (four) times daily. 0700, 1100, 1500, and 1900    [provider]  cetirizine (ZYRTEC) 10 MG tablet Take 10 mg by mouth at bedtime.     [provider]  Cholecalciferol  (VITAMIN D -3) 1000 units CAPS Take 1,000 Int'l Units by mouth daily.    [provider]  fluticasone  (FLONASE ) 50 MCG/ACT nasal spray Place 2 sprays into both nostrils daily. Patient  taking differently: Place 2 sprays into both nostrils daily as needed. 12/15/16   Van Knee, MD  gabapentin  (NEURONTIN ) 300 MG capsule Take 300 mg by mouth daily.    [provider]  levothyroxine  (SYNTHROID ) 25 MCG tablet Take 1 tablet (25 mcg total) by mouth daily at 6 (six) AM. 09/03/23 10/03/23  Dezii, Alexandra, DO  losartan  (COZAAR ) 25 MG tablet Take 50 mg by mouth Nightly.    [provider]  magnesium  sulfate 500 MG/ML SOLN oral syringe Take 250 mg by mouth daily.    [provider]  Multiple Vitamins-Minerals (MULTIVITAMIN ADULT) TABS Take 1 tablet by mouth 2 (two) times daily.    [provider]  nitroGLYCERIN (NITROSTAT) 0.4 MG SL tablet Place 0.4 mg under the tongue every 5 (five) minutes as needed for chest pain.    [provider]  pantoprazole  (PROTONIX ) 40 MG tablet Take 40 mg by mouth 2 (two) times daily before a meal.    [provider]  rosuvastatin  (CRESTOR ) 20 MG tablet Take 20 mg by mouth at bedtime.    [provider]   sertraline  (ZOLOFT ) 50 MG tablet Take 25 mg by mouth at bedtime.     [provider]  SUMAtriptan  (IMITREX ) 50 MG tablet Take 1 tablet (50 mg total) by mouth once as needed for migraine. May repeat in 2 hours if headache persists or recurs. Do not exceed 100mg  in a day. 09/02/23 09/01/24  Dezii, Alexandra, DO  traMADol  (ULTRAM ) 50 MG tablet Take 25-50 mg by mouth at bedtime. 08/13/23   [provider]  vitamin C  (ASCORBIC ACID ) 500 MG tablet Take 500 mg by mouth daily.    [provider]    Physical Exam: Vitals:   10/07/23 1000 10/07/23 1112 10/07/23 1120 10/07/23 1130  BP: (!) 164/73  (!) 156/72 (!) 174/86  Pulse: (!) 56  60 63  Resp: 15  (!) 23 20  Temp:  98.2 F (36.8 C)    TempSrc:  Oral    SpO2: 100%  100% 100%  Weight:      Height:        Physical Exam   Constitutional: Alert, awake, calm, comfortable HEENT: Neck supple Respiratory: Clear to auscultation B/L, no wheezing, no rales.  Cardiovascular: Regular rate and rhythm, no murmurs / rubs / gallops. No extremity edema. 2+ pedal pulses. No carotid bruits.  Abdomen: Soft, no tenderness, Bowel sounds positive.  Musculoskeletal: no clubbing / cyanosis. Good ROM, no contractures. Normal muscle tone.  Skin: no rashes, lesions, ulcers. Neurologic: CN 2-12 grossly intact. Sensation intact, No focal deficit identified Psychiatric: Alert and oriented x 3. Normal mood.    Labs on Admission: I have personally reviewed following labs and imaging studies  CBC: Recent Labs  Lab 10/07/23 0801  WBC 7.0  NEUTROABS 3.9  HGB 13.4  HCT 40.7  MCV 89.1  PLT 145*   Basic Metabolic Panel: Recent Labs  Lab 10/07/23 0801  NA 131*  K 4.4  CL 98  CO2 23  GLUCOSE 107*  BUN 16  CREATININE 0.94  CALCIUM  9.6   GFR: Estimated Creatinine Clearance: 35 mL/min (by C-G formula based on SCr of 0.94 mg/dL). Liver Function Tests: Recent Labs  Lab 10/07/23 1208  AST 28  ALT 21  ALKPHOS 50  BILITOT 0.7   PROT 6.2*  ALBUMIN 3.9   No results for input(s): LIPASE, AMYLASE in the last 168 hours. No results for input(s): AMMONIA in the last 168 hours. Coagulation  Profile: No results for input(s): INR, PROTIME in the last 168 hours. Cardiac Enzymes: Recent Labs  Lab 10/07/23 0801  TROPONINIHS 10   BNP (last 3 results) No results for input(s): BNP in the last 8760 hours. HbA1C: No results for input(s): HGBA1C in the last 72 hours. CBG: No results for input(s): GLUCAP in the last 168 hours. Lipid Profile: No results for input(s): CHOL, HDL, LDLCALC, TRIG, CHOLHDL, LDLDIRECT in the last 72 hours. Thyroid Function Tests: No results for input(s): TSH, T4TOTAL, FREET4, T3FREE, THYROIDAB in the last 72 hours. Anemia Panel: No results for input(s): VITAMINB12, FOLATE, FERRITIN, TIBC, IRON, RETICCTPCT in the last 72 hours. Urine analysis:    Component Value Date/Time   COLORURINE YELLOW (A) 10/07/2023 0830   APPEARANCEUR CLEAR (A) 10/07/2023 0830   LABSPEC 1.009 10/07/2023 0830   PHURINE 7.0 10/07/2023 0830   GLUCOSEU NEGATIVE 10/07/2023 0830   HGBUR NEGATIVE 10/07/2023 0830   BILIRUBINUR NEGATIVE 10/07/2023 0830   KETONESUR NEGATIVE 10/07/2023 0830   PROTEINUR NEGATIVE 10/07/2023 0830   NITRITE NEGATIVE 10/07/2023 0830   LEUKOCYTESUR NEGATIVE 10/07/2023 0830    Radiological Exams on Admission: I have personally reviewed images CT Head Wo Contrast Result Date: 10/07/2023 EXAM: CT HEAD WITHOUT CONTRAST 10/07/2023 10:39:31 AM TECHNIQUE: CT of the head was performed without the administration of intravenous contrast. Automated exposure control, iterative reconstruction, and/or weight based adjustment of the mA/kV was utilized to reduce the radiation dose to as low as reasonably achievable. COMPARISON: 09/15/2019 CLINICAL HISTORY: Headache, increasing frequency or severity. BIB [facility] for HTN. Reports was here on the 9th for the same but BP  is not getting better and even took extra BP meds. Patient complains of headache and upper back burning. EMS got 230/90. FINDINGS: BRAIN AND VENTRICLES: No acute hemorrhage. No evidence of acute infarct. No hydrocephalus. No extra-axial collection. No mass effect or midline shift. Nonspecific hypoattenuation in the periventricular and subcortical white matter, most likely representing chronic microvascular ischemic changes. 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 ORBITS: Bilateral lens replacement. SINUSES: No acute abnormality. SOFT TISSUES AND SKULL: No acute soft tissue abnormality. No skull fracture. IMPRESSION: 1. No acute intracranial abnormality. 2. Mild chronic microvascular ischemic changes. 3. 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 Electronically signed by: Donnice Mania MD 10/07/2023 11:07 AM EDT RP Workstation: [id number]    EKG: My personal interpretation of EKG shows: Sinus rhythm with RBBB and LAFB, no evidence of acute ischemia    Assessment/Plan Principal Problem:   Hypertensive urgency Active Problems:   GERD (gastroesophageal reflux disease)   Mixed hyperlipidemia   Parkinson's disease (HCC)    Assessment and Plan: 83 year old female with multiple medical problems including but not limited to essential hypertension, Parkinson's disease, GERD, hyperlipidemia who was brought in to ED for uncontrolled hypertension and headache.  1.  Uncontrolled hypertension with a headache - Patient has Parkinson's disease with dis-autoregulation.  It appears that patient has this phenomenon for some time.  Patient was prescribed hydralazine  at home which she ran out recently. - CT scan of the brain has been negative for acute pathology, troponins have been negative, EKG no acute ischemia, normal kidney function - She will be placed in observation - She will be given hydralazine  as needed for systolic blood pressure more than 160 - Will resume her home medications - Her blood  pressure will be monitored - She may need prescription of hydralazine  as needed at the time of discharge  2.  Parkinson's disease - Stable - Resume  her home medications  3.  GERD/HLD - Resume her home medications     DVT prophylaxis: Lovenox  Code Status: DNR/DNI(Do NOT Intubate) Family Communication: Patient and her daughter at bedside Disposition Plan: Home Consults called: None Admission status: Observation, Telemetry bed   Nena Rebel, MD [facility] 10/07/2023, 1:19 PM

## 2023-10-07 NOTE — ED Provider Notes (Signed)
 [facility] Provider Note    Event Date/Time   First MD Initiated Contact with Patient 10/07/23 [phone]     (approximate)   History   Hypertension   HPI  Katelyn Crawford is a 83 y.o. female with a history of CAD, Parkinson's, and hypertension who presents with concern for elevated blood pressure readings over 202 systolic last night and tonight.  The patient states that she started to have back pain in her mid/upper back yesterday.  She has had this before where she will get the back pain and then developed elevated blood pressure.  She also reports a frontal headache.  She denies any chest or abdominal pain.  She denies feeling dizzy or lightheaded.  She has no shortness of breath or leg swelling.  She took an extra dose of her losartan  last night and again this morning but the blood pressure remained high.  I reviewed the past medical records.  The patient was admitted to the hospitalist service last month with severe headache and uncontrolled hypertension, as well as hyponatremia.   Physical Exam   Triage Vital Signs: ED Triage Vitals  Encounter Vitals Group     BP      Girls Systolic BP Percentile      Girls Diastolic BP Percentile      Boys Systolic BP Percentile      Boys Diastolic BP Percentile      Pulse      Resp      Temp      Temp src      SpO2      Weight      Height      Head Circumference      Peak Flow      Pain Score      Pain Loc      Pain Education      Exclude from Growth Chart     Most recent vital signs: Vitals:   10/07/23 1120 10/07/23 1130  BP: (!) 156/72 (!) 174/86  Pulse: 60 63  Resp: (!) 23 20  Temp:    SpO2: 100% 100%     General: Alert, comfortable appearing, no distress.  CV:  Good peripheral perfusion.  Resp:  Normal effort.  Lungs CTAB. Abd:  No distention.  Other:  No midline spinal tenderness.  No peripheral edema.   ED Results / Procedures / Treatments   Labs (all labs ordered are listed, but only  abnormal results are displayed) Labs Reviewed  BASIC METABOLIC PANEL WITH GFR - Abnormal; Notable for the following components:      Result Value   Sodium 131 (*)    Glucose, Bld 107 (*)    GFR, Estimated 60 (*)    All other components within normal limits  CBC WITH DIFFERENTIAL/PLATELET - Abnormal; Notable for the following components:   Platelets 145 (*)    All other components within normal limits  URINALYSIS, ROUTINE W REFLEX MICROSCOPIC - Abnormal; Notable for the following components:   Color, Urine YELLOW (*)    APPearance CLEAR (*)    All other components within normal limits  RESP PANEL BY RT-PCR (RSV, FLU A&B, COVID)  [id number]  HEPATIC FUNCTION PANEL  LACTIC ACID, PLASMA  LACTIC ACID, PLASMA  TROPONIN I (HIGH SENSITIVITY)     EKG  ED ECG REPORT I, Waylon Cassis, the attending physician, personally viewed and interpreted this ECG.  Date: 10/07/2023 EKG Time: 0729 Rate: 68 Rhythm: normal sinus rhythm QRS  Axis: normal Intervals: RBBB, LAFB ST/T Wave abnormalities: normal Narrative Interpretation: no evidence of acute ischemia    RADIOLOGY  CT head: I independently viewed and interpreted the images; there is no ICH.  Radiology report indicates no acute abnormality  PROCEDURES:  Critical Care performed: No  Procedures   MEDICATIONS ORDERED IN ED: Medications  carbidopa -levodopa  (SINEMET  CR) 50-200 MG per tablet controlled release 1 tablet (has no administration in time range)  labetalol  (NORMODYNE ) injection 10 mg (10 mg Intravenous Given 10/07/23 0814)  acetaminophen  (TYLENOL ) tablet 650 mg (650 mg Oral Given 10/07/23 0830)  metoCLOPramide  (REGLAN ) injection 10 mg (10 mg Intravenous Given 10/07/23 1110)  diphenhydrAMINE  (BENADRYL ) injection 12.5 mg (12.5 mg Intravenous Given 10/07/23 1110)  labetalol  (NORMODYNE ) injection 5 mg (5 mg Intravenous Given 10/07/23 1146)     IMPRESSION / MDM / ASSESSMENT AND PLAN / ED COURSE  I reviewed the triage vital  signs and the nursing notes.  83 year old female with PMH as noted above presents with elevated blood pressure, also reporting some upper back pain.  She has had this back pain before.  She had a similar presentation about a month ago with hypertension and headache.  On exam her blood pressure is elevated.  Physical exam is otherwise unremarkable for acute findings.  Differential diagnosis includes, but is not limited to, hypertensive urgency or emergency, musculoskeletal back pain, less likely ACS or other cardiac etiology.  There is no clinical evidence of aortic dissection or other vascular etiology, or of PE.  The patient has no chest pain or respiratory symptoms.  We will obtain basic labs, troponin, give IV labetalol  which has worked well previously, and reassess.  Patient's presentation is most consistent with acute complicated illness / injury requiring diagnostic workup.  The patient is on the cardiac monitor to evaluate for evidence of arrhythmia and/or significant heart rate changes.   ----------------------------------------- 12:01 PM on 10/07/2023 -----------------------------------------  Lab workup is unremarkable.  BMP shows normal creatinine.  CBC is normal.  Troponin is negative.  The blood pressure responded immediately to the labetalol , however the patient continued to report relatively severe headache that did not respond to Tylenol .  We obtained a CT head which is also negative for acute findings.  I gave a dose of Reglan  and Benadryl  for the headache.  The patient is now having some akathisia likely due to the Reglan  in combination with her Parkinson's.  She has also been off of her Sinemet  for a few days.  I ordered a dose of her Sinemet  now.  She also reports a cough so I have ordered a chest x-ray and respiratory panel.  The patient remains alert but continues to appear weak and uncomfortable despite the negative workup so far.  Her blood pressure is starting to climb  again so I ordered additional labetalol .  Given her persistent symptoms and persistently elevated blood pressure, the patient will benefit from inpatient admission for further workup and management.  I consulted Dr. Roann from the hospitalist service; based on our discussion he agrees to evaluate the patient for admission.    FINAL CLINICAL IMPRESSION(S) / ED DIAGNOSES   Final diagnoses:  Uncontrolled hypertension  Acute nonintractable headache, unspecified headache type     Rx / DC Orders   ED Discharge Orders     None        Note:  This document was prepared using Dragon voice recognition software and may include unintentional dictation errors.    Jacolyn Pae, MD 10/07/23 1204

## 2023-10-07 NOTE — ED Triage Notes (Signed)
 BIB [facility] for HTN.  Reports was here on the 9th for the same but BP is not getting better and even took extra BP meds.  Patient complaisn of headache and upper back burning.  EMS got 230/90;s

## 2023-10-07 NOTE — ED Notes (Signed)
 Patient standby assist to bathroom.  Reports she had a good bowel movement.

## 2023-10-07 NOTE — ED Notes (Signed)
 Transported to CT

## 2023-10-08 ENCOUNTER — Encounter

## 2023-10-08 ENCOUNTER — Ambulatory Visit: Admitting: Physical Therapy

## 2023-10-08 DIAGNOSIS — I10 Essential (primary) hypertension: Secondary | ICD-10-CM

## 2023-10-08 DIAGNOSIS — K219 Gastro-esophageal reflux disease without esophagitis: Secondary | ICD-10-CM | POA: Diagnosis not present

## 2023-10-08 DIAGNOSIS — R519 Headache, unspecified: Secondary | ICD-10-CM | POA: Diagnosis not present

## 2023-10-08 DIAGNOSIS — E782 Mixed hyperlipidemia: Secondary | ICD-10-CM

## 2023-10-08 DIAGNOSIS — G20A1 Parkinson's disease without dyskinesia, without mention of fluctuations: Secondary | ICD-10-CM

## 2023-10-08 DIAGNOSIS — I160 Hypertensive urgency: Secondary | ICD-10-CM | POA: Diagnosis not present

## 2023-10-08 LAB — BASIC METABOLIC PANEL WITH GFR
Anion gap: 10 (ref 5–15)
BUN: 13 mg/dL (ref 8–23)
CO2: 25 mmol/L (ref 22–32)
Calcium: 9.1 mg/dL (ref 8.9–10.3)
Chloride: 98 mmol/L (ref 98–111)
Creatinine, Ser: 1.02 mg/dL — ABNORMAL HIGH (ref 0.44–1.00)
GFR, Estimated: 54 mL/min — ABNORMAL LOW (ref >60)
Glucose, Bld: 95 mg/dL (ref 70–99)
Potassium: 4.1 mmol/L (ref 3.5–5.1)
Sodium: 133 mmol/L — ABNORMAL LOW (ref 135–145)

## 2023-10-08 LAB — CBC
HCT: 35.3 % — ABNORMAL LOW (ref 36.0–46.0)
Hemoglobin: 11.8 g/dL — ABNORMAL LOW (ref 12.0–15.0)
MCH: 29.7 pg (ref 26.0–34.0)
MCHC: 33.4 g/dL (ref 30.0–36.0)
MCV: 88.9 fL (ref 80.0–100.0)
Platelets: 139 K/uL — ABNORMAL LOW (ref 150–400)
RBC: 3.97 MIL/uL (ref 3.87–5.11)
RDW: 14.4 % (ref 11.5–15.5)
WBC: 5.5 K/uL (ref 4.0–10.5)
nRBC: 0 % (ref 0.0–0.2)

## 2023-10-08 MED ORDER — HYDRALAZINE HCL 25 MG PO TABS: 25.0000 mg | ORAL_TABLET | Freq: Three times a day (TID) | ORAL | 2 refills | Status: DC | PRN

## 2023-10-08 NOTE — Discharge Summary (Signed)
 Physician Discharge Summary   Patient: Katelyn Crawford MRN: [id number] DOB: Jan 31, 1940  Admit date:     10/07/2023  Discharge date: 10/08/23  Discharge Physician: Amaryllis Dare   PCP: Auston Reyes BIRCH, MD   Recommendations at discharge:  Follow-up with primary care provider Follow-up with neurology  Discharge Diagnoses: Principal Problem:   Hypertensive urgency Active Problems:   GERD (gastroesophageal reflux disease)   Mixed hyperlipidemia   Uncontrolled hypertension   Parkinson's disease (HCC)   Acute nonintractable headache   Hospital Course: Partly taken from H&P.  Katelyn Crawford is a pleasant 83 y.o. female with medical history significant for CAD s/p stent, Parkinson's disease, HTN, HLD who was brought in to ED by EMS for uncontrolled blood pressure.  Patient also complained of headache and upper back burning.  Blood pressure with EMS was 230/90.  Patient and daughter stated that patient had been ongoing intermittent uncontrolled hypertension and initially prescribed hydralazine  to take 1 tablet if the blood pressure is 170.  That medication has expired and she does not have anything to take at home.   She had similar admission to hospitalist service within a month ago for severe headache and uncontrolled hypertension along with hyponatremia.   On presentation patient was hypertensive, CT head negative for any acute abnormality and EKG with no new findings.  Patient was given labetalol  10 mg x 1, Tylenol , Reglan  and Benadryl .  Admitted under observation for hypertension.  9/17: Vitals stable with blood pressure of 99/59.  Patient with history of Parkinson's with related autoregulation.  Patient was given hydralazine  to use as needed for systolic above 160.  Labs seem stable.  Symptoms resolved and blood pressure seems well-controlled.  Patient was given hydralazine  25 mg tablets to use as needed.  She will continue the rest of her home medications and need to have a close  follow-up with her providers for further assistance.  Case was discussed with her daughter who is also her caregiver at bedside.  Consultants: None Procedures performed: None Disposition: Home Diet recommendation:  Discharge Diet Orders (From admission, onward)     Start     Ordered   10/08/23 0000  Diet - low sodium heart healthy        10/08/23 1111           Regular diet DISCHARGE MEDICATION: Allergies as of 10/08/2023       Reactions   Penicillins    Has patient had a PCN reaction causing immediate rash, facial/tongue/throat swelling, SOB or lightheadedness with hypotension: no Has patient had a PCN reaction causing severe rash involving mucus membranes or skin necrosis: no Has patient had a PCN reaction that required hospitalization: no Has patient had a PCN reaction occurring within the last 10 years: no If all of the above answers are NO, then may proceed with Cephalosporin use.   Sulfa Antibiotics    UNKNOWN        Medication List     TAKE these medications    acetaminophen  500 MG tablet Commonly known as: TYLENOL  Take 500 mg by mouth every 8 (eight) hours as needed.   ascorbic acid  500 MG tablet Commonly known as: VITAMIN C  Take 500 mg by mouth daily.   aspirin  EC 81 MG tablet Take 81 mg by mouth daily.   B COMPLEX VITAMINS PO Take 1 tablet by mouth daily.   calcium  carbonate 1250 (500 Ca) MG tablet Commonly known as: OS-CAL - dosed in mg of elemental calcium  Take 625  mg by mouth 2 (two) times daily with a meal.   carbidopa -levodopa  25-250 MG tablet Commonly known as: SINEMET  IR Take 1 tablet by mouth 4 (four) times daily. 0700, 1100, 1500, and 1900   carbidopa -levodopa  50-200 MG tablet Commonly known as: SINEMET  CR Take 1 tablet by mouth at bedtime.   cetirizine 10 MG tablet Commonly known as: ZYRTEC Take 10 mg by mouth at bedtime.   fluticasone  50 MCG/ACT nasal spray Commonly known as: FLONASE  Place 2 sprays into both nostrils  daily. What changed:  when to take this reasons to take this   gabapentin  300 MG capsule Commonly known as: NEURONTIN  Take 300 mg by mouth daily.   hydrALAZINE  25 MG tablet Commonly known as: APRESOLINE  Take 1 tablet (25 mg total) by mouth 3 (three) times daily as needed (Systolic BP above 160).   levothyroxine  25 MCG tablet Commonly known as: SYNTHROID  Take 1 tablet (25 mcg total) by mouth daily at 6 (six) AM.   losartan  25 MG tablet Commonly known as: COZAAR  Take 50 mg by mouth Nightly.   magnesium  sulfate 500 MG/ML Soln oral syringe Take 250 mg by mouth daily.   Multivitamin Adult Tabs Take 1 tablet by mouth 2 (two) times daily.   nitroGLYCERIN 0.4 MG SL tablet Commonly known as: NITROSTAT Place 0.4 mg under the tongue every 5 (five) minutes as needed for chest pain.   pantoprazole  40 MG tablet Commonly known as: PROTONIX  Take 40 mg by mouth 2 (two) times daily before a meal.   rosuvastatin  20 MG tablet Commonly known as: CRESTOR  Take 20 mg by mouth at bedtime.   sertraline  50 MG tablet Commonly known as: ZOLOFT  Take 25 mg by mouth at bedtime.   SUMAtriptan  50 MG tablet Commonly known as: Imitrex  Take 1 tablet (50 mg total) by mouth once as needed for migraine. May repeat in 2 hours if headache persists or recurs. Do not exceed 100mg  in a day.   Vitamin D -3 25 MCG (1000 UT) Caps Take 1,000 Int'l Units by mouth daily.        Follow-up Information     Sparks, Reyes BIRCH, MD Follow up.   Specialty: Internal Medicine Why: hospital follow up Contact information: [address] [place] [address] [phone]                Discharge Exam: Katelyn Crawford   10/07/23 0724 10/07/23 2006  Weight: 69.9 kg 65.4 kg   General.  Frail elderly lady, in no acute distress. Pulmonary.  Lungs clear bilaterally, normal respiratory effort. CV.  Regular rate and rhythm, no JVD, rub or murmur. Abdomen.  Soft, nontender, nondistended, BS positive. CNS.   Alert and oriented .  No focal neurologic deficit. Extremities.  No edema,  pulses intact and symmetrical. Psychiatry.  Judgment and insight appears normal.   Condition at discharge: stable  The results of significant diagnostics from this hospitalization (including imaging, microbiology, ancillary and laboratory) are listed below for reference.   Imaging Studies: DG Chest Port 1 View Result Date: 10/07/2023 EXAM: 1 VIEW XRAY OF THE CHEST 10/07/2023 01:47:10 PM COMPARISON: 08/31/2023 CLINICAL HISTORY: Cough 10031. cough FINDINGS: LUNGS AND PLEURA: Low lung volumes. Left basilar opacity. No pulmonary edema. No pleural effusion. No pneumothorax. HEART AND MEDIASTINUM: No acute abnormality of the cardiac and mediastinal silhouettes. Atherosclerotic calcifications. BONES AND SOFT TISSUES: No acute osseous abnormality. IMPRESSION: 1. Left basilar opacity, possibly related to low lung volumes. Electronically signed by: Norman Gatlin MD 10/07/2023 01:52 PM EDT  RP Workstation: [id number]   CT Head Wo Contrast Result Date: 10/07/2023 EXAM: CT HEAD WITHOUT CONTRAST 10/07/2023 10:39:31 AM TECHNIQUE: CT of the head was performed without the administration of intravenous contrast. Automated exposure control, iterative reconstruction, and/or weight based adjustment of the mA/kV was utilized to reduce the radiation dose to as low as reasonably achievable. COMPARISON: 09/15/2019 CLINICAL HISTORY: Headache, increasing frequency or severity. BIB [facility] for HTN. Reports was here on the 9th for the same but BP is not getting better and even took extra BP meds. Patient complains of headache and upper back burning. EMS got 230/90. FINDINGS: BRAIN AND VENTRICLES: No acute hemorrhage. No evidence of acute infarct. No hydrocephalus. No extra-axial collection. No mass effect or midline shift. Nonspecific hypoattenuation in the periventricular and subcortical white matter, most likely representing chronic microvascular ischemic  changes. 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 ORBITS: Bilateral lens replacement. SINUSES: No acute abnormality. SOFT TISSUES AND SKULL: No acute soft tissue abnormality. No skull fracture. IMPRESSION: 1. No acute intracranial abnormality. 2. Mild chronic microvascular ischemic changes. 3. 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 Electronically signed by: Donnice Mania MD 10/07/2023 11:07 AM EDT RP Workstation: [id number]    Microbiology: Results for orders placed or performed during the hospital encounter of 10/07/23  Resp panel by RT-PCR (RSV, Flu A&B, Covid) Anterior Nasal Swab     Status: None   Collection Time: 10/07/23 11:47 AM   Specimen: Anterior Nasal Swab  Result Value Ref Range Status   SARS Coronavirus 2 by RT PCR NEGATIVE NEGATIVE Final    Comment: (NOTE) SARS-CoV-2 target nucleic acids are NOT DETECTED.  The SARS-CoV-2 RNA is generally detectable in upper respiratory specimens during the acute phase of infection. The lowest concentration of SARS-CoV-2 viral copies this assay can detect is 138 copies/mL. A negative result does not preclude SARS-Cov-2 infection and should not be used as the sole basis for treatment or other patient management decisions. A negative result may occur with  improper specimen collection/handling, submission of specimen other than nasopharyngeal swab, presence of viral mutation(s) within the areas targeted by this assay, and inadequate number of viral copies(<138 copies/mL). A negative result must be combined with clinical observations, patient history, and epidemiological information. The expected result is Negative.  Fact Sheet for Patients:  BloggerCourse.com  Fact Sheet for Healthcare Providers:  SeriousBroker.it  This test is no t yet approved or cleared by the [place]  FDA and  has been authorized for detection and/or diagnosis of SARS-CoV-2 by FDA under an Emergency Use  Authorization (EUA). This EUA will remain  in effect (meaning this test can be used) for the duration of the COVID-19 declaration under Section 564(b)(1) of the Act, 21 U.S.C.section 360bbb-3(b)(1), unless the authorization is terminated  or revoked sooner.       Influenza A by PCR NEGATIVE NEGATIVE Final   Influenza B by PCR NEGATIVE NEGATIVE Final    Comment: (NOTE) The Xpert Xpress SARS-CoV-2/FLU/RSV plus assay is intended as an aid in the diagnosis of influenza from Nasopharyngeal swab specimens and should not be used as a sole basis for treatment. Nasal washings and aspirates are unacceptable for Xpert Xpress SARS-CoV-2/FLU/RSV testing.  Fact Sheet for Patients: BloggerCourse.com  Fact Sheet for Healthcare Providers: SeriousBroker.it  This test is not yet approved or cleared by the [place]  FDA and has been authorized for detection and/or diagnosis of SARS-CoV-2 by FDA under an Emergency Use Authorization (EUA). This EUA will remain in effect (meaning this test can  be used) for the duration of the COVID-19 declaration under Section 564(b)(1) of the Act, 21 U.S.C. section 360bbb-3(b)(1), unless the authorization is terminated or revoked.     Resp Syncytial Virus by PCR NEGATIVE NEGATIVE Final    Comment: (NOTE) Fact Sheet for Patients: BloggerCourse.com  Fact Sheet for Healthcare Providers: SeriousBroker.it  This test is not yet approved or cleared by the [place]  FDA and has been authorized for detection and/or diagnosis of SARS-CoV-2 by FDA under an Emergency Use Authorization (EUA). This EUA will remain in effect (meaning this test can be used) for the duration of the COVID-19 declaration under Section 564(b)(1) of the Act, 21 U.S.C. section 360bbb-3(b)(1), unless the authorization is terminated or revoked.  Performed at [facility], [address] [place], [place] [address]     Labs: CBC: Recent Labs  Lab 10/07/23 0801 10/08/23 0550  WBC 7.0 5.5  NEUTROABS 3.9  --   HGB 13.4 11.8*  HCT 40.7 35.3*  MCV 89.1 88.9  PLT 145* 139*   Basic Metabolic Panel: Recent Labs  Lab 10/07/23 0801 10/08/23 0550  NA 131* 133*  K 4.4 4.1  CL 98 98  CO2 23 25  GLUCOSE 107* 95  BUN 16 13  CREATININE 0.94 1.02*  CALCIUM  9.6 9.1   Liver Function Tests: Recent Labs  Lab 10/07/23 1208  AST 28  ALT 21  ALKPHOS 50  BILITOT 0.7  PROT 6.2*  ALBUMIN 3.9   CBG: No results for input(s): GLUCAP in the last 168 hours.  Discharge time spent: greater than 30 minutes.  This record has been created using Conservation officer, historic buildings. Errors have been sought and corrected,but may not always be located. Such creation errors do not reflect on the standard of care.   Signed: Amaryllis Dare, MD [facility] 10/08/2023

## 2023-10-08 NOTE — Care Management Obs Status (Signed)
 MEDICARE OBSERVATION STATUS NOTIFICATION   Patient Details  Name: CIARRA BRADDY MRN: [id number] Date of Birth: 06-Mar-1940   Medicare Observation Status Notification Given:  Yes    Evonna Stoltz W, CMA 10/08/2023, 11:03 AM

## 2023-10-08 NOTE — Hospital Course (Addendum)
 Partly taken from H&P.  Katelyn Crawford is a pleasant 83 y.o. female with medical history significant for CAD s/p stent, Parkinson's disease, HTN, HLD who was brought in to ED by EMS for uncontrolled blood pressure.  Patient also complained of headache and upper back burning.  Blood pressure with EMS was 230/90.  Patient and daughter stated that patient had been ongoing intermittent uncontrolled hypertension and initially prescribed hydralazine  to take 1 tablet if the blood pressure is 170.  That medication has expired and she does not have anything to take at home.   She had similar admission to hospitalist service within a month ago for severe headache and uncontrolled hypertension along with hyponatremia.   On presentation patient was hypertensive, CT head negative for any acute abnormality and EKG with no new findings.  Patient was given labetalol  10 mg x 1, Tylenol , Reglan  and Benadryl .  Admitted under observation for hypertension.  9/17: Vitals stable with blood pressure of 99/59.  Patient with history of Parkinson's with related autoregulation.  Patient was given hydralazine  to use as needed for systolic above 160.  Labs seem stable.  Symptoms resolved and blood pressure seems well-controlled.  Patient was given hydralazine  25 mg tablets to use as needed.  She will continue the rest of her home medications and need to have a close follow-up with her providers for further assistance.

## 2023-10-08 NOTE — Plan of Care (Signed)
  Problem: Clinical Measurements: Goal: Ability to maintain clinical measurements within normal limits will improve Outcome: Progressing Goal: Diagnostic test results will improve Outcome: Progressing   Problem: Nutrition: Goal: Adequate nutrition will be maintained Outcome: Progressing   Problem: Pain Managment: Goal: General experience of comfort will improve and/or be controlled Outcome: Progressing

## 2023-10-13 ENCOUNTER — Encounter

## 2023-10-13 ENCOUNTER — Ambulatory Visit: Admitting: Physical Therapy

## 2023-10-15 ENCOUNTER — Encounter

## 2023-10-15 ENCOUNTER — Ambulatory Visit: Admitting: Physical Therapy

## 2023-10-20 ENCOUNTER — Encounter

## 2023-10-20 ENCOUNTER — Ambulatory Visit: Admitting: Physical Therapy

## 2023-10-22 ENCOUNTER — Ambulatory Visit: Admitting: Physical Therapy

## 2023-10-22 ENCOUNTER — Encounter

## 2023-10-22 ENCOUNTER — Other Ambulatory Visit: Payer: Self-pay | Admitting: Internal Medicine

## 2023-10-22 DIAGNOSIS — Z1231 Encounter for screening mammogram for malignant neoplasm of breast: Secondary | ICD-10-CM

## 2023-10-24 ENCOUNTER — Inpatient Hospital Stay
Admission: EM | Admit: 2023-10-24 | Discharge: 2023-10-26 | DRG: 057 | Disposition: A | Discharge status: Home Health | Attending: Internal Medicine | Admitting: Internal Medicine

## 2023-10-24 ENCOUNTER — Observation Stay
Admit: 2023-10-24 | Discharge: 2023-10-24 | Disposition: A | Discharge status: Home / Self Care | Attending: Internal Medicine | Admitting: Internal Medicine

## 2023-10-24 ENCOUNTER — Other Ambulatory Visit: Payer: Self-pay

## 2023-10-24 ENCOUNTER — Emergency Department

## 2023-10-24 DIAGNOSIS — Z7982 Long term (current) use of aspirin: Secondary | ICD-10-CM

## 2023-10-24 DIAGNOSIS — G909 Disorder of the autonomic nervous system, unspecified: Principal | ICD-10-CM | POA: Diagnosis present

## 2023-10-24 DIAGNOSIS — E66811 Obesity, class 1: Secondary | ICD-10-CM | POA: Diagnosis present

## 2023-10-24 DIAGNOSIS — N182 Chronic kidney disease, stage 2 (mild): Secondary | ICD-10-CM | POA: Diagnosis present

## 2023-10-24 DIAGNOSIS — Z79899 Other long term (current) drug therapy: Secondary | ICD-10-CM

## 2023-10-24 DIAGNOSIS — G903 Multi-system degeneration of the autonomic nervous system: Principal | ICD-10-CM | POA: Diagnosis present

## 2023-10-24 DIAGNOSIS — I2510 Atherosclerotic heart disease of native coronary artery without angina pectoris: Secondary | ICD-10-CM | POA: Diagnosis present

## 2023-10-24 DIAGNOSIS — Z66 Do not resuscitate: Secondary | ICD-10-CM | POA: Diagnosis present

## 2023-10-24 DIAGNOSIS — N3000 Acute cystitis without hematuria: Secondary | ICD-10-CM | POA: Diagnosis present

## 2023-10-24 DIAGNOSIS — E871 Hypo-osmolality and hyponatremia: Secondary | ICD-10-CM | POA: Diagnosis present

## 2023-10-24 DIAGNOSIS — M810 Age-related osteoporosis without current pathological fracture: Secondary | ICD-10-CM | POA: Diagnosis present

## 2023-10-24 DIAGNOSIS — I10 Essential (primary) hypertension: Secondary | ICD-10-CM | POA: Diagnosis present

## 2023-10-24 DIAGNOSIS — R55 Syncope and collapse: Secondary | ICD-10-CM | POA: Diagnosis not present

## 2023-10-24 DIAGNOSIS — Z955 Presence of coronary angioplasty implant and graft: Secondary | ICD-10-CM

## 2023-10-24 DIAGNOSIS — R4020 Unspecified coma: Principal | ICD-10-CM

## 2023-10-24 DIAGNOSIS — K219 Gastro-esophageal reflux disease without esophagitis: Secondary | ICD-10-CM | POA: Diagnosis present

## 2023-10-24 DIAGNOSIS — E039 Hypothyroidism, unspecified: Secondary | ICD-10-CM | POA: Diagnosis present

## 2023-10-24 DIAGNOSIS — I129 Hypertensive chronic kidney disease with stage 1 through stage 4 chronic kidney disease, or unspecified chronic kidney disease: Secondary | ICD-10-CM | POA: Diagnosis present

## 2023-10-24 DIAGNOSIS — Z96652 Presence of left artificial knee joint: Secondary | ICD-10-CM | POA: Diagnosis present

## 2023-10-24 DIAGNOSIS — Z8249 Family history of ischemic heart disease and other diseases of the circulatory system: Secondary | ICD-10-CM

## 2023-10-24 DIAGNOSIS — Z6832 Body mass index (BMI) 32.0-32.9, adult: Secondary | ICD-10-CM

## 2023-10-24 DIAGNOSIS — D696 Thrombocytopenia, unspecified: Secondary | ICD-10-CM | POA: Diagnosis present

## 2023-10-24 DIAGNOSIS — Z880 Allergy status to penicillin: Secondary | ICD-10-CM

## 2023-10-24 DIAGNOSIS — R531 Weakness: Secondary | ICD-10-CM | POA: Diagnosis not present

## 2023-10-24 DIAGNOSIS — I959 Hypotension, unspecified: Secondary | ICD-10-CM | POA: Insufficient documentation

## 2023-10-24 DIAGNOSIS — I951 Orthostatic hypotension: Secondary | ICD-10-CM | POA: Diagnosis present

## 2023-10-24 DIAGNOSIS — G20A1 Parkinson's disease without dyskinesia, without mention of fluctuations: Secondary | ICD-10-CM | POA: Diagnosis present

## 2023-10-24 DIAGNOSIS — Z79890 Hormone replacement therapy (postmenopausal): Secondary | ICD-10-CM

## 2023-10-24 LAB — URINALYSIS, COMPLETE (UACMP) WITH MICROSCOPIC
Bacteria, UA: NONE SEEN
Bilirubin Urine: NEGATIVE
Glucose, UA: NEGATIVE mg/dL
Hgb urine dipstick: NEGATIVE
Ketones, ur: NEGATIVE mg/dL
Nitrite: NEGATIVE
Protein, ur: NEGATIVE mg/dL
Specific Gravity, Urine: 1.008 (ref 1.005–1.030)
pH: 7 (ref 5.0–8.0)

## 2023-10-24 LAB — COMPREHENSIVE METABOLIC PANEL WITH GFR
ALT: <5 U/L (ref 0–44)
AST: 35 U/L (ref 15–41)
Albumin: 3.9 g/dL (ref 3.5–5.0)
Alkaline Phosphatase: 56 U/L (ref 38–126)
Anion gap: 12 (ref 5–15)
BUN: 17 mg/dL (ref 8–23)
CO2: 25 mmol/L (ref 22–32)
Calcium: 9.5 mg/dL (ref 8.9–10.3)
Chloride: 94 mmol/L — ABNORMAL LOW (ref 98–111)
Creatinine, Ser: 0.89 mg/dL (ref 0.44–1.00)
GFR, Estimated: >60 mL/min (ref >60)
Glucose, Bld: 114 mg/dL — ABNORMAL HIGH (ref 70–99)
Potassium: 4.2 mmol/L (ref 3.5–5.1)
Sodium: 131 mmol/L — ABNORMAL LOW (ref 135–145)
Total Bilirubin: 0.7 mg/dL (ref 0.0–1.2)
Total Protein: 7.1 g/dL (ref 6.5–8.1)

## 2023-10-24 LAB — CBC WITH DIFFERENTIAL/PLATELET
Abs Immature Granulocytes: 0.01 K/uL (ref 0.00–0.07)
Basophils Absolute: 0 K/uL (ref 0.0–0.1)
Basophils Relative: 1 %
Eosinophils Absolute: 0.2 K/uL (ref 0.0–0.5)
Eosinophils Relative: 3 %
HCT: 35.6 % — ABNORMAL LOW (ref 36.0–46.0)
Hemoglobin: 12 g/dL (ref 12.0–15.0)
Immature Granulocytes: 0 %
Lymphocytes Relative: 30 %
Lymphs Abs: 1.7 K/uL (ref 0.7–4.0)
MCH: 30.1 pg (ref 26.0–34.0)
MCHC: 33.7 g/dL (ref 30.0–36.0)
MCV: 89.2 fL (ref 80.0–100.0)
Monocytes Absolute: 0.6 K/uL (ref 0.1–1.0)
Monocytes Relative: 10 %
Neutro Abs: 3.2 K/uL (ref 1.7–7.7)
Neutrophils Relative %: 56 %
Platelets: 148 K/uL — ABNORMAL LOW (ref 150–400)
RBC: 3.99 MIL/uL (ref 3.87–5.11)
RDW: 14.4 % (ref 11.5–15.5)
WBC: 5.7 K/uL (ref 4.0–10.5)
nRBC: 0 % (ref 0.0–0.2)

## 2023-10-24 LAB — TROPONIN I (HIGH SENSITIVITY): Troponin I (High Sensitivity): 6 ng/L (ref <18)

## 2023-10-24 LAB — CBG MONITORING, ED: Glucose-Capillary: 93 mg/dL (ref 70–99)

## 2023-10-24 MED ORDER — LORATADINE 10 MG PO TABS
10.0000 mg | ORAL_TABLET | Freq: Every day | ORAL | Status: DC
Start: 2023-10-24 — End: 2023-10-26
  Administered 2023-10-24 – 2023-10-26 (×3): 10 mg via ORAL
  Filled 2023-10-24 (×3): qty 1

## 2023-10-24 MED ORDER — SODIUM CHLORIDE 0.9 % IV SOLN
1.0000 g | Freq: Once | INTRAVENOUS | Status: AC
  Administered 2023-10-24: 1 g via INTRAVENOUS
  Filled 2023-10-24: qty 10

## 2023-10-24 MED ORDER — ASPIRIN 81 MG PO TBEC
81.0000 mg | DELAYED_RELEASE_TABLET | Freq: Every day | ORAL | Status: DC
  Administered 2023-10-24 – 2023-10-26 (×3): 81 mg via ORAL
  Filled 2023-10-24 (×3): qty 1

## 2023-10-24 MED ORDER — SODIUM CHLORIDE 0.9 % IV SOLN
12.5000 mg | Freq: Four times a day (QID) | INTRAVENOUS | Status: DC | PRN
  Filled 2023-10-24: qty 0.5

## 2023-10-24 MED ORDER — CARBIDOPA-LEVODOPA ER 50-200 MG PO TBCR
1.0000 | EXTENDED_RELEASE_TABLET | Freq: Every day | ORAL | Status: DC
  Administered 2023-10-24 – 2023-10-25 (×2): 1 via ORAL
  Filled 2023-10-24 (×2): qty 1

## 2023-10-24 MED ORDER — ROSUVASTATIN CALCIUM 20 MG PO TABS
20.0000 mg | ORAL_TABLET | Freq: Every day | ORAL | Status: DC
  Administered 2023-10-24 – 2023-10-25 (×2): 20 mg via ORAL
  Filled 2023-10-24: qty 2
  Filled 2023-10-24: qty 1
  Filled 2023-10-24: qty 2
  Filled 2023-10-24: qty 1

## 2023-10-24 MED ORDER — SUMATRIPTAN SUCCINATE 50 MG PO TABS
50.0000 mg | ORAL_TABLET | Freq: Once | ORAL | Status: DC | PRN
  Administered 2023-10-24: 50 mg via ORAL
  Filled 2023-10-24 (×3): qty 1

## 2023-10-24 MED ORDER — HYDRALAZINE HCL 50 MG PO TABS
50.0000 mg | ORAL_TABLET | Freq: Two times a day (BID) | ORAL | Status: DC
  Administered 2023-10-24 (×2): 50 mg via ORAL
  Filled 2023-10-24 (×3): qty 1

## 2023-10-24 MED ORDER — HEPARIN SODIUM (PORCINE) 5000 UNIT/ML IJ SOLN
5000.0000 [IU] | Freq: Two times a day (BID) | INTRAMUSCULAR | Status: DC
  Administered 2023-10-24 – 2023-10-26 (×5): 5000 [IU] via SUBCUTANEOUS
  Filled 2023-10-24 (×5): qty 1

## 2023-10-24 MED ORDER — ONDANSETRON HCL 4 MG PO TABS: 4.0000 mg | ORAL_TABLET | Freq: Four times a day (QID) | ORAL | Status: DC | PRN

## 2023-10-24 MED ORDER — CAPTOPRIL 25 MG PO TABS
25.0000 mg | ORAL_TABLET | Freq: Two times a day (BID) | ORAL | Status: DC
  Administered 2023-10-24 (×2): 25 mg via ORAL
  Filled 2023-10-24 (×4): qty 1

## 2023-10-24 MED ORDER — CARBIDOPA-LEVODOPA 25-250 MG PO TABS
1.0000 | ORAL_TABLET | Freq: Four times a day (QID) | ORAL | Status: DC
  Administered 2023-10-24 – 2023-10-26 (×8): 1 via ORAL
  Filled 2023-10-24 (×10): qty 1

## 2023-10-24 MED ORDER — GABAPENTIN 300 MG PO CAPS
300.0000 mg | ORAL_CAPSULE | Freq: Every day | ORAL | Status: DC
  Administered 2023-10-24 – 2023-10-25 (×2): 300 mg via ORAL
  Filled 2023-10-24 (×2): qty 1

## 2023-10-24 MED ORDER — SODIUM CHLORIDE 0.9% FLUSH
3.0000 mL | Freq: Two times a day (BID) | INTRAVENOUS | Status: DC
  Administered 2023-10-24: 10 mL via INTRAVENOUS
  Administered 2023-10-24 – 2023-10-25 (×2): 3 mL via INTRAVENOUS
  Administered 2023-10-25: 10 mL via INTRAVENOUS
  Administered 2023-10-26: 3 mL via INTRAVENOUS

## 2023-10-24 MED ORDER — ONDANSETRON HCL 4 MG/2ML IJ SOLN
4.0000 mg | Freq: Four times a day (QID) | INTRAMUSCULAR | Status: DC | PRN
  Administered 2023-10-24: 4 mg via INTRAVENOUS
  Filled 2023-10-24: qty 2

## 2023-10-24 MED ORDER — SERTRALINE HCL 50 MG PO TABS
25.0000 mg | ORAL_TABLET | Freq: Every day | ORAL | Status: DC
  Administered 2023-10-24 – 2023-10-25 (×2): 25 mg via ORAL
  Filled 2023-10-24 (×2): qty 1

## 2023-10-24 MED ORDER — PANTOPRAZOLE SODIUM 40 MG PO TBEC
40.0000 mg | DELAYED_RELEASE_TABLET | Freq: Two times a day (BID) | ORAL | Status: DC
  Administered 2023-10-24 – 2023-10-26 (×4): 40 mg via ORAL
  Filled 2023-10-24 (×4): qty 1

## 2023-10-24 MED ORDER — LEVOTHYROXINE SODIUM 50 MCG PO TABS
25.0000 ug | ORAL_TABLET | Freq: Every day | ORAL | Status: DC
  Administered 2023-10-25 – 2023-10-26 (×2): 25 ug via ORAL
  Filled 2023-10-24 (×2): qty 1

## 2023-10-24 MED ORDER — ACETAMINOPHEN 500 MG PO TABS
500.0000 mg | ORAL_TABLET | Freq: Three times a day (TID) | ORAL | Status: DC | PRN
  Administered 2023-10-24 – 2023-10-25 (×2): 500 mg via ORAL
  Filled 2023-10-24 (×3): qty 1

## 2023-10-24 MED ORDER — HYDROMORPHONE HCL 2 MG PO TABS
1.0000 mg | ORAL_TABLET | Freq: Four times a day (QID) | ORAL | Status: DC | PRN
  Administered 2023-10-24: 1 mg via ORAL
  Filled 2023-10-24 (×2): qty 1

## 2023-10-24 NOTE — Progress Notes (Signed)
 PT Cancellation Note  Patient Details Name: REE ALCALDE MRN: [id number] DOB: January 10, 1941   Cancelled Treatment:    Reason Eval/Treat Not Completed: Medical issues which prohibited therapy (Consult received and chart reviewed.  Patient noted with symptomatic orthostasis during admission assessment to unit. Will hold mobility assessment unti BP stabilized and patient able to safely tolerate mobility. Will continue to follow and re-attempt as medically appropriate)   Tudor Chandley H. Delores, PT, DPT, NCS 10/24/23, 2:31 PM [phone]

## 2023-10-24 NOTE — ED Provider Notes (Signed)
 [facility] Provider Note    Event Date/Time   First MD Initiated Contact with Patient 10/24/23 [phone]     (approximate)   History   Hypertension and Weakness  Pt here via [facility] from home with hypertension, 125/79 . Pt endorses weakness , has been having episodes of being non verbal at times. Pt alert and oriented on arrival. Pt c/o burning in her back.  175/80 77 97% RA    HPI Katelyn Crawford is a 83 y.o. female PMH hypertension, CKD, CAD with prior stent, Parkinson's, hyponatremia presents for evaluation of elevated blood pressure and possible fatigue/weakness recently -Per EMS, they were called for elevated blood pressure.  Apparently patient had had several elevated blood pressure readings overnight though was normotensive on their evaluation.  Was reported to have possibly had a couple episodes of transient difficulty speaking though details are somewhat unclear. - Patient tells me that she believes her daughter and I called an ambulance because she started feeling lightheaded and nauseous when she walked to the bathroom.  Says she did have some headache last night though this has resolved.  Somewhat limited historian. - collateral gathered from daughter Romero) by phone -- pt woke up at 7am, had elevated BP, then within next hour she started wrapping her hands around her head, told her she wasn't sure she could walk. Said she felt she may have passed out, family heard no thud.  Patient then became nonresponsive for about 1-2 minutes -- 2 distinct episodes this morning, eyes were open but she was completely nonresponsive to them screaming at her to try to wake her. Was breathing. No convulsions. Was up from 1:30-7am, has had difficulty sleeping. No trauma.    Per chart review, patient was recently admitted 10/07/2023-10/08/2023 after calling EMS for elevated blood pressure and noted headache, upper back discomfort.  Blood pressure 230/90.  CT head negative.  EKG with  no new findings.  Discharge hypertension medicine regimen: Losartan  50 mg nightly, hydralazine  25 mg 3 times daily as needed (SBP greater than 160).   Similar admission in August of this year as well.     Physical Exam   Triage Vital Signs: BP (!) 175/79   Pulse 77   Temp 98 F (36.7 C) (Oral)   Resp (!) 23   Ht 4' 8 (1.422 m)   Wt 65.4 kg   SpO2 100%   BMI 32.32 kg/m     Most recent vital signs: Vitals:   10/24/23 1000 10/24/23 1030  BP: (!) 166/69 (!) 175/79  Pulse: 73 77  Resp: (!) 22 (!) 23  Temp:    SpO2: 100% 100%     General: Awake, no distress.  CV:  Good peripheral perfusion. RRR, RP 2+ Resp:  Normal effort. CTAB Abd:  No distention. Nontender to deep palpation throughout Neuro:  Aox4, CN II-XII intact, FNF wnl, finger taps fast b/l, 5/5 strength in bilateral finger extension/grip, arm flexion/extension, EHL/FHL. BUE AG 10+ sec no drift, BLE AG 5+ sec no drift. SILT.  Gait testing deferred.     ED Results / Procedures / Treatments   Labs (all labs ordered are listed, but only abnormal results are displayed) Labs Reviewed  COMPREHENSIVE METABOLIC PANEL WITH GFR - Abnormal; Notable for the following components:      Result Value   Sodium 131 (*)    Chloride 94 (*)    Glucose, Bld 114 (*)    All other components within normal limits  URINALYSIS,  COMPLETE (UACMP) WITH MICROSCOPIC - Abnormal; Notable for the following components:   Color, Urine YELLOW (*)    APPearance HAZY (*)    Leukocytes,Ua TRACE (*)    All other components within normal limits  CBC WITH DIFFERENTIAL/PLATELET - Abnormal; Notable for the following components:   HCT 35.6 (*)    Platelets 148 (*)    All other components within normal limits  CBC WITH DIFFERENTIAL/PLATELET  CBG MONITORING, ED  TROPONIN I (HIGH SENSITIVITY)     EKG  Ecg = sinus rhythm, rate 68, no gross ST elevation or depression, no significant repolarization abnormality, left axis deviation, first-degree AV  block present, 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   RADIOLOGY CT head interpreted by myself and radiology report reviewed.  No acute pathology identified.    PROCEDURES:  Critical Care performed: No  Procedures   MEDICATIONS ORDERED IN ED: Medications  cefTRIAXone (ROCEPHIN) 1 g in sodium chloride  0.9 % 100 mL IVPB (has no administration in time range)     IMPRESSION / MDM / ASSESSMENT AND PLAN / ED COURSE  I reviewed the triage vital signs and the nursing notes.                              DDX/MDM/AP: Differential diagnosis includes, but is not limited to, arrhythmia, ACS, consider symptoms secondary to previously elevated blood pressure or possible transient hypotension.  Consider underlying electrolyte abnormality or anemia.  No findings to suggest acute stroke.  Considered but doubt seizures.  Consider vasovagal episodes though recurrent nature is atypical.  Plan: - Labs - CT head - EKG - Cardiac monitor - Low threshold for admission  Patient's presentation is most consistent with acute presentation with potential threat to life or bodily function.  The patient is on the cardiac monitor to evaluate for evidence of arrhythmia and/or significant heart rate changes.  ED course below.  Workup with equivocal urine, patient does note recent urinary symptoms, treated empirically with ceftriaxone.  Cardiopulmonary workup otherwise reassuring.  CT head with no acute findings, small old right cerebellar infarct visualized.  Given recurrent episodes of nonresponsiveness/apparent loss of consciousness, I do believe patient would benefit from admission.  Can continue to monitor blood pressures inpatient, no severe ranges here.  Admitted to hospitalist service.  Clinical Course as of 10/24/23 1102  Fri Oct 24, 2023  0829 Poc glucose 93 [MM]  0934 CMP reviewed, overall unremarkable, mild stable hyponatremia [MM]  0934 CBC reviewed, unremarkable [MM]   0934 Troponin normal [MM]  0934 CTH: IMPRESSION: 1. No acute findings. 2. Minimal chronic ischemic microvascular disease. 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   [MM]  [phone] Urinalysis with possible UTI [MM]  1034 Updated patient and family on reassuring results so far.  They offered further clinical history --state patient actually had 2 episodes in which she became nonresponsive this morning, each lasting about a minute.  Has not had similar episodes in the past.  Patient is also endorsing recent urinary symptoms including frequency and urgency as recently as this morning, will treat empirically for UTI.  Given recurrent loss of consciousness episodes today I do think patient would benefit from admission, patient is amenable.  Hospitalist consult order placed.  Blood pressures remain mildly elevated though SBP less than 180 and diastolic less than 110, patient currently asymptomatic, will defer aggressive blood pressure intervention at this time. [MM]    Clinical Course User Index [  MM] Clarine Ozell LABOR, MD     FINAL CLINICAL IMPRESSION(S) / ED DIAGNOSES   Final diagnoses:  Loss of consciousness (HCC)  Acute cystitis without hematuria  Hypertension, unspecified type     Rx / DC Orders   ED Discharge Orders     None        Note:  This document was prepared using Dragon voice recognition software and may include unintentional dictation errors.   Clarine Ozell LABOR, MD 10/24/23 1102

## 2023-10-24 NOTE — H&P (Addendum)
 History and Physical    Katelyn Crawford FMW:[id number] DOB: Jun 01, 1940 DOA: 10/24/2023  PCP: Auston Reyes BIRCH, MD (Confirm with patient/family/NH records and if not entered, this has to be entered at [facility] point of entry) Patient coming from: Home  I have personally briefly reviewed patient's old medical records in [facility]  Chief Complaint: Headache, unresponsive  HPI: Katelyn Crawford is a 83 y.o. female with medical history significant of Parkinson's disease, HTN, CKD stage IIIa, CAD status post stenting presented with syncope.  Daughter at bedside gave history.  Patient was recently hospitalized for uncontrolled high blood pressure.  After discharge, her BP medication were adjusted, losartan  was discontinued and patient was started on captopril by cardiology and also added a as needed hydralazine  25 mg for SBP more than 170.  Family reported the patient appeared to be very sensitive to high blood pressure, when she will start to have headache and tingling sensations of her both arms and the legs.  Since last week her blood pressure  all over the place and patient has been waking up in the middle of the night for headache when family will measure her blood pressure and found many times SBP more than 200.  Last 3 days she was instructed to take 50 mg of hydralazine  at night but despite she continued to wake up at night with breakthrough SBP readings more than 200.  This morning, patient was f complaining about tingling sensations of whole body and sensation of about to pass out done was found to be staring and unresponsive to episode each episode lasted for few minutes and patient recover responsiveness spontaneously.  Family found the patient appeared to be pale during the episode but they were not able to check her blood pressure during the episode.  She denied any dysuria no diarrhea no chest pains or shortness of breath.  ED Course: Afebrile, nontachycardic blood pressure 170/90 O2 saturation  100% on room air.  CT head without contrast showed chronic microvascular disease, blood work showed sodium 131 potassium 4.2 BUN 17 creatinine 0.  8 UA showed 1+ WBC, 1+ RBC.  Patient was given ceftriaxone in the ED.  Review of Systems: As per HPI otherwise 14 point review of systems negative.    Past Medical History:  Diagnosis Date   Anemia    Aortic atherosclerosis    Arthritis    Cardiomegaly    Cardiomyopathy (HCC)    Cholelithiasis    Chronic kidney disease, stage 2 (mild)    Chronic sinus bradycardia    Coronary artery disease 03/10/2012   a.) LHC 03/10/2012: 40% mLAD, 50% pRI, 50% pRCA, 90% mRCA, 50% dRCA, 50% RPDA - unable to cannulate RCA - med mgmt; b.) LHC/PCI 06/25/2013: 30% pLAD, 40% mLAD, 20% mLCx, 30% RI, 30% pRCA, 70% mRCA (2.5 mm Promus DES), 40% dRCA-1, 90% dRCA-2 (2.5 mm Promus DES)   DDD (degenerative disc disease), thoracolumbar    Diverticulosis    Dizziness    Environmental allergies    Full dentures    GERD (gastroesophageal reflux disease) 06/25/2013   H/O bilateral cataract extraction 2019   History of diverticulosis    History of kidney stones    HLD (hyperlipidemia)    Hyperlipidemia, unspecified    Hypertension    Nephrolithiasis    Osteoporosis, post-menopausal    Parkinson disease (HCC)    Pre-diabetes    PVC's (premature ventricular contractions)    Scoliosis    Thoracic kyphosis    Trifascicular bundle  branch block    a.) 1st degree AVB + RBBB + LAFB   Vascular disease     Past Surgical History:  Procedure Laterality Date   APPENDECTOMY     BREAST EXCISIONAL BIOPSY Left 1984   neg   BREAST EXCISIONAL BIOPSY Right 1997   lipoma   CATARACT EXTRACTION W/PHACO Left 09/02/2017   Procedure: CATARACT EXTRACTION PHACO AND INTRAOCULAR LENS PLACEMENT (IOC)  LEFT;  Surgeon: Mittie Gaskin, MD;  Location: [facility];  Service: Ophthalmology;  Laterality: Left;   CATARACT EXTRACTION W/PHACO Right 10/01/2017   Procedure:  CATARACT EXTRACTION PHACO AND INTRAOCULAR LENS PLACEMENT (IOC) RIGHT;  Surgeon: Mittie Gaskin, MD;  Location: [facility];  Service: Ophthalmology;  Laterality: Right;   COLONOSCOPY WITH PROPOFOL  N/A 11/27/2016   Procedure: COLONOSCOPY WITH PROPOFOL ;  Surgeon: Toledo, Ladell POUR, MD;  Location: [facility];  Service: Gastroenterology;  Laterality: N/A;   CORONARY ANGIOPLASTY WITH STENT PLACEMENT Left 06/25/2013   Procedure: CORONARY ANGIOPLASTY WITH STENT PLACEMENT; Location: [facility]; Surgeon: Dayton Dash, MD   KNEE ARTHROPLASTY Left 01/22/2017   Procedure: COMPUTER ASSISTED TOTAL KNEE ARTHROPLASTY;  Surgeon: Mardee Lynwood SQUIBB, MD;  Location: [facility];  Service: Orthopedics;  Laterality: Left;   KNEE ARTHROSCOPY Left 02/01/2015   Procedure: ARTHROSCOPY KNEE, partial medial & lateral menisectomy,,medial and patelofemoral chondroplasty;  Surgeon: Lynwood SQUIBB Mardee, MD;  Location: [facility];  Service: Orthopedics;  Laterality: Left;   LEFT HEART CATH AND CORONARY ANGIOGRAPHY Left 03/10/2012   Procedure: LEFT HEART CATH AND CORONARY ANGIOGRAPHY; Location: [facility]; Surgeon: Marsa Dooms, MD   LITHOTRIPSY Right 1995   RIGHT AXILLARY LIPOMA REMOVAL     TUBAL LIGATION     VARICOSE VEIN SURGERY       reports that she has never smoked. She has never used smokeless tobacco. She reports that she does not drink alcohol and does not use drugs.  Allergies  Allergen Reactions   Penicillins     Has patient had a PCN reaction causing immediate rash, facial/tongue/throat swelling, SOB or lightheadedness with hypotension: no Has patient had a PCN reaction causing severe rash involving mucus membranes or skin necrosis: no Has patient had a PCN reaction that required hospitalization: no Has patient had a PCN reaction occurring within the last 10 years: no If all of the above answers are NO, then may proceed with Cephalosporin use.    Sulfa Antibiotics     UNKNOWN    Family History  Problem  Relation Age of Onset   Hypertension Mother    Congestive Heart Failure Father    Coronary artery disease Father    Coronary artery disease Son        STEMI w/ PCI   Breast cancer Neg Hx    Colon cancer Neg Hx    Colon polyps Neg Hx     Prior to Admission medications   Medication Sig Start Date End Date Taking? Authorizing Provider  acetaminophen  (TYLENOL ) 500 MG tablet Take 500 mg by mouth every 8 (eight) hours as needed.    [provider]  aspirin  EC 81 MG tablet Take 81 mg by mouth daily.    [provider]  B COMPLEX VITAMINS PO Take 1 tablet by mouth daily.    [provider]  calcium  carbonate (OS-CAL - DOSED IN MG OF ELEMENTAL CALCIUM ) 1250 (500 Ca) MG tablet Take 625 mg by mouth 2 (two) times daily with a meal.    [provider]  carbidopa -levodopa  (SINEMET  CR) 50-200 MG tablet Take  1 tablet by mouth at bedtime.    [provider]  carbidopa -levodopa  (SINEMET  IR) 25-250 MG tablet Take 1 tablet by mouth 4 (four) times daily. 0700, 1100, 1500, and 1900    [provider]  cetirizine (ZYRTEC) 10 MG tablet Take 10 mg by mouth at bedtime.     [provider]  Cholecalciferol  (VITAMIN D -3) 1000 units CAPS Take 1,000 Int'l Units by mouth daily.    [provider]  fluticasone  (FLONASE ) 50 MCG/ACT nasal spray Place 2 sprays into both nostrils daily. Patient taking differently: Place 2 sprays into both nostrils daily as needed. 12/15/16   Van Knee, MD  gabapentin  (NEURONTIN ) 300 MG capsule Take 300 mg by mouth daily.    [provider]  hydrALAZINE  (APRESOLINE ) 25 MG tablet Take 1 tablet (25 mg total) by mouth 3 (three) times daily as needed (Systolic BP above 160). 10/08/23   Amin, Sumayya, MD  levothyroxine  (SYNTHROID ) 25 MCG tablet Take 1 tablet (25 mcg total) by mouth daily at 6 (six) AM. 09/03/23 10/07/23  Dezii, Alexandra, DO  losartan  (COZAAR ) 25 MG tablet Take 50 mg by mouth Nightly.    [provider]  magnesium  sulfate 500 MG/ML SOLN oral syringe Take 250 mg by mouth daily.    [provider]  Multiple Vitamins-Minerals (MULTIVITAMIN ADULT) TABS Take 1 tablet by mouth 2 (two) times daily.    [provider]  nitroGLYCERIN (NITROSTAT) 0.4 MG SL tablet Place 0.4 mg under the tongue every 5 (five) minutes as needed for chest pain.    [provider]  pantoprazole  (PROTONIX ) 40 MG tablet Take 40 mg by mouth 2 (two) times daily before a meal.    [provider]  rosuvastatin  (CRESTOR ) 20 MG tablet Take 20 mg by mouth at bedtime.    [provider]  sertraline  (ZOLOFT ) 50 MG tablet Take 25 mg by mouth at bedtime.     [provider]  SUMAtriptan  (IMITREX ) 50 MG tablet Take 1 tablet (50 mg total) by mouth once as needed for migraine. May repeat in 2 hours if headache persists or recurs. Do not exceed 100mg  in a day. 09/02/23 09/01/24  Dezii, Alexandra, DO  vitamin C  (ASCORBIC ACID ) 500 MG tablet Take 500 mg by mouth daily.    [provider]    Physical Exam: Vitals:   10/24/23 0830 10/24/23 0930 10/24/23 1000 10/24/23 1030  BP: (!) 169/82 (!) 166/67 (!) 166/69 (!) 175/79  Pulse:  74 73 77  Resp:  (!) 22 (!) 22 (!) 23  Temp:      TempSrc:      SpO2:  100% 100% 100%  Weight:      Height:        Constitutional: NAD, calm, comfortable Vitals:   10/24/23 0830 10/24/23 0930 10/24/23 1000 10/24/23 1030  BP: (!) 169/82 (!) 166/67 (!) 166/69 (!) 175/79  Pulse:  74 73 77  Resp:  (!) 22 (!) 22 (!) 23  Temp:      TempSrc:      SpO2:  100% 100% 100%  Weight:      Height:       Eyes: PERRL, lids and conjunctivae normal ENMT: Mucous membranes are moist. Posterior pharynx clear of any exudate or lesions.Normal dentition.  Neck: normal, supple, no masses, no thyromegaly Respiratory: clear to auscultation bilaterally, no wheezing, no crackles. Normal respiratory effort. No accessory muscle use.  Cardiovascular: Regular  rate and rhythm, no murmurs / rubs / gallops.  No extremity edema. 2+ pedal pulses. No carotid bruits.  Abdomen: no tenderness, no masses palpated. No hepatosplenomegaly. Bowel sounds positive.  Musculoskeletal: no clubbing / cyanosis. No joint deformity upper and lower extremities. Good ROM, no contractures. Normal muscle tone.  Skin: no rashes, lesions, ulcers. No induration Neurologic: CN 2-12 grossly intact. Sensation intact, DTR normal. Strength 5/5 in all 4.  Resting tremors on bilateral arms and hands Psychiatric: Normal judgment and insight. Alert and oriented x 3. Normal mood.     Labs on Admission: I have personally reviewed following labs and imaging studies  CBC: Recent Labs  Lab 10/24/23 0910  WBC 5.7  NEUTROABS 3.2  HGB 12.0  HCT 35.6*  MCV 89.2  PLT 148*   Basic Metabolic Panel: Recent Labs  Lab 10/24/23 0832  NA 131*  K 4.2  CL 94*  CO2 25  GLUCOSE 114*  BUN 17  CREATININE 0.89  CALCIUM  9.5   GFR: Estimated Creatinine Clearance: 35.6 mL/min (by C-G formula based on SCr of 0.89 mg/dL). Liver Function Tests: Recent Labs  Lab 10/24/23 0832  AST 35  ALT <5  ALKPHOS 56  BILITOT 0.7  PROT 7.1  ALBUMIN 3.9   No results for input(s): LIPASE, AMYLASE in the last 168 hours. No results for input(s): AMMONIA in the last 168 hours. Coagulation Profile: No results for input(s): INR, PROTIME in the last 168 hours. Cardiac Enzymes: No results for input(s): CKTOTAL, CKMB, CKMBINDEX, TROPONINI in the last 168 hours. BNP (last 3 results) No results for input(s): PROBNP in the last 8760 hours. HbA1C: No results for input(s): HGBA1C in the last 72 hours. CBG: Recent Labs  Lab 10/24/23 0828  GLUCAP 93   Lipid Profile: No results for input(s): CHOL, HDL, LDLCALC, TRIG, CHOLHDL, LDLDIRECT in the last 72 hours. Thyroid Function Tests: No results for input(s): TSH, T4TOTAL, FREET4, T3FREE, THYROIDAB in the last 72  hours. Anemia Panel: No results for input(s): VITAMINB12, FOLATE, FERRITIN, TIBC, IRON, RETICCTPCT in the last 72 hours. Urine analysis:    Component Value Date/Time   COLORURINE YELLOW (A) 10/24/2023 0843   APPEARANCEUR HAZY (A) 10/24/2023 0843   LABSPEC 1.008 10/24/2023 0843   PHURINE 7.0 10/24/2023 0843   GLUCOSEU NEGATIVE 10/24/2023 0843   HGBUR NEGATIVE 10/24/2023 0843   BILIRUBINUR NEGATIVE 10/24/2023 0843   KETONESUR NEGATIVE 10/24/2023 0843   PROTEINUR NEGATIVE 10/24/2023 0843   NITRITE NEGATIVE 10/24/2023 0843   LEUKOCYTESUR TRACE (A) 10/24/2023 0843    Radiological Exams on Admission: CT HEAD WO CONTRAST ( ) Result Date: 10/24/2023 CLINICAL DATA:  Possible syncope.  Headache.  Altered mental status. EXAM: CT HEAD WITHOUT CONTRAST TECHNIQUE: Contiguous axial images were obtained from the base of the skull through the vertex without intravenous contrast. RADIATION DOSE REDUCTION: This exam was performed according to the departmental dose-optimization program which includes automated exposure control, adjustment of the mA and/or kV according to patient size and/or use of iterative reconstruction technique. COMPARISON:  10/07/2023 FINDINGS: Brain: Ventricles, cisterns and other CSF spaces are normal. There is minimal chronic ischemic microvascular disease present. There is no mass, mass effect, shift of midline structures or acute hemorrhage. 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 Vascular: No hyperdense vessel or unexpected calcification. Skull: Normal. Negative for fracture or focal lesion. Sinuses/Orbits: Orbits are normal. Paranasal sinuses are well aerated and clear. Other: IMPRESSION: 1. No acute findings. 2. Minimal chronic ischemic microvascular disease. 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 Electronically Signed   By: Toribio Agreste M.D.   On:  10/24/2023 09:16    EKG: Independently reviewed.  Sinus rhythm, no acute PR or QTc interval  changes  Assessment/Plan Principal Problem:   Syncope  (please populate well all problems here in Problem List. (For example, if patient is on BP meds at home and you resume or decide to hold them, it is a problem that needs to be her. Same for CAD, COPD, HLD and so on)  Near syncope versus acute metabolic encephalopathy Hypertension urgency - Her symptoms cannot be distinguished between near syncope or transient encephalopathy from uncontrolled hypertension. - Syncope workup, orthostatic vital signs, echocardiogram - Will change her BP medication regimen, increase hydralazine  to 50 mg twice daily, continue captopril.  Titrate Lokring to response. - Other DDx, patient has no urinary complaints and given that most of her symptoms correlated with her blood pressure rather than other organ systems, will not redose antibiotics.  Low suspicion for ventricle arrhythmia, no significant arrhythmia or QTc/PR interval changes on EKG and telemonitoring in ED, continue telemonitoring x 24 hours.  Parkinson's disease -Stable as described by family - Continue Sinemet   CKD stage IIIa - Family expressed concern about nephrotoxicity and HTN medications, I discussed with them that as long as patient's kidney function remains stable, ACEI can be continued.  Family agreed with the plan.  Hypothyroidism - Continue Synthroid   GERD - Continue PPI Total time spent on patient care 55 minutes  DVT prophylaxis: Heparin subcu Code Status: DNR/DNI Family Communication: 2 daughters at bedside Disposition Plan: Expect less than 2 midnight hospital stay Consults called: None Admission status: Telemetry observation   Cort ONEIDA Mana MD [facility] Pager [phone]  10/24/2023, 11:11 AM

## 2023-10-24 NOTE — ED Notes (Signed)
 Pt ambulatory to bathroom in room with 2 person assist.

## 2023-10-24 NOTE — Progress Notes (Signed)
 OT Cancellation Note  Patient Details Name: Katelyn Crawford MRN: [id number] DOB: Jan 05, 1941   Cancelled Treatment:    Reason Eval/Treat Not Completed: Medical issues which prohibited therapy. OT order received and chart reviewed. Patient noted with symptomatic orthostasis during admission assessment to unit. Will hold OT evaluation until BP stabilized and patient able to safely tolerate mobility. Will continue to follow and re-attempt as medically appropriate   Izetta Claude, MS, OTR/L , CBIS ascom [phone]  10/24/23, 2:36 PM

## 2023-10-24 NOTE — Plan of Care (Signed)

## 2023-10-24 NOTE — Progress Notes (Signed)
 Nursing report patient has strong positive orthostatic hypotension, SBP dropped from 170 to 100 and patient started to have lightheadedness.  Will order TED hose.

## 2023-10-24 NOTE — ED Triage Notes (Signed)
 Pt here via [facility] from home with hypertension, 125/79 . Pt endorses weakness , has been having episodes of being non verbal at times. Pt alert and oriented on arrival. Pt c/o burning in her back.  175/80 77 97% RA

## 2023-10-25 DIAGNOSIS — G20A1 Parkinson's disease without dyskinesia, without mention of fluctuations: Secondary | ICD-10-CM | POA: Diagnosis present

## 2023-10-25 DIAGNOSIS — N182 Chronic kidney disease, stage 2 (mild): Secondary | ICD-10-CM | POA: Diagnosis present

## 2023-10-25 DIAGNOSIS — Z96652 Presence of left artificial knee joint: Secondary | ICD-10-CM | POA: Diagnosis present

## 2023-10-25 DIAGNOSIS — I129 Hypertensive chronic kidney disease with stage 1 through stage 4 chronic kidney disease, or unspecified chronic kidney disease: Secondary | ICD-10-CM | POA: Diagnosis present

## 2023-10-25 DIAGNOSIS — R55 Syncope and collapse: Secondary | ICD-10-CM | POA: Diagnosis not present

## 2023-10-25 DIAGNOSIS — I9589 Other hypotension: Secondary | ICD-10-CM | POA: Diagnosis not present

## 2023-10-25 DIAGNOSIS — Z66 Do not resuscitate: Secondary | ICD-10-CM | POA: Diagnosis present

## 2023-10-25 DIAGNOSIS — E66811 Obesity, class 1: Secondary | ICD-10-CM | POA: Insufficient documentation

## 2023-10-25 DIAGNOSIS — Z7982 Long term (current) use of aspirin: Secondary | ICD-10-CM | POA: Diagnosis not present

## 2023-10-25 DIAGNOSIS — E871 Hypo-osmolality and hyponatremia: Secondary | ICD-10-CM | POA: Diagnosis present

## 2023-10-25 DIAGNOSIS — Z79899 Other long term (current) drug therapy: Secondary | ICD-10-CM | POA: Diagnosis not present

## 2023-10-25 DIAGNOSIS — M810 Age-related osteoporosis without current pathological fracture: Secondary | ICD-10-CM | POA: Diagnosis present

## 2023-10-25 DIAGNOSIS — E039 Hypothyroidism, unspecified: Secondary | ICD-10-CM | POA: Diagnosis present

## 2023-10-25 DIAGNOSIS — Z6832 Body mass index (BMI) 32.0-32.9, adult: Secondary | ICD-10-CM | POA: Diagnosis not present

## 2023-10-25 DIAGNOSIS — G903 Multi-system degeneration of the autonomic nervous system: Secondary | ICD-10-CM | POA: Diagnosis present

## 2023-10-25 DIAGNOSIS — D696 Thrombocytopenia, unspecified: Secondary | ICD-10-CM | POA: Diagnosis present

## 2023-10-25 DIAGNOSIS — K219 Gastro-esophageal reflux disease without esophagitis: Secondary | ICD-10-CM | POA: Diagnosis present

## 2023-10-25 DIAGNOSIS — Z8249 Family history of ischemic heart disease and other diseases of the circulatory system: Secondary | ICD-10-CM | POA: Diagnosis not present

## 2023-10-25 DIAGNOSIS — Z955 Presence of coronary angioplasty implant and graft: Secondary | ICD-10-CM | POA: Diagnosis not present

## 2023-10-25 DIAGNOSIS — I959 Hypotension, unspecified: Secondary | ICD-10-CM | POA: Insufficient documentation

## 2023-10-25 DIAGNOSIS — N3000 Acute cystitis without hematuria: Secondary | ICD-10-CM | POA: Diagnosis present

## 2023-10-25 DIAGNOSIS — I2510 Atherosclerotic heart disease of native coronary artery without angina pectoris: Secondary | ICD-10-CM | POA: Diagnosis present

## 2023-10-25 DIAGNOSIS — Z79890 Hormone replacement therapy (postmenopausal): Secondary | ICD-10-CM | POA: Diagnosis not present

## 2023-10-25 DIAGNOSIS — Z880 Allergy status to penicillin: Secondary | ICD-10-CM | POA: Diagnosis not present

## 2023-10-25 DIAGNOSIS — I10 Essential (primary) hypertension: Secondary | ICD-10-CM

## 2023-10-25 DIAGNOSIS — R531 Weakness: Secondary | ICD-10-CM | POA: Diagnosis present

## 2023-10-25 LAB — GLUCOSE, CAPILLARY: Glucose-Capillary: 88 mg/dL (ref 70–99)

## 2023-10-25 MED ORDER — SODIUM CHLORIDE 0.9 % IV BOLUS
1000.0000 mL | Freq: Once | INTRAVENOUS | Status: AC
  Administered 2023-10-25: 1000 mL via INTRAVENOUS

## 2023-10-25 MED ORDER — DILTIAZEM HCL ER 90 MG PO CP12
90.0000 mg | ORAL_CAPSULE | Freq: Every day | ORAL | Status: DC
  Administered 2023-10-25: 90 mg via ORAL
  Filled 2023-10-25: qty 1

## 2023-10-25 MED ORDER — LACTULOSE 10 GM/15ML PO SOLN
20.0000 g | Freq: Every day | ORAL | Status: DC | PRN
  Administered 2023-10-25: 20 g via ORAL
  Filled 2023-10-25: qty 30

## 2023-10-25 MED ORDER — SENNOSIDES-DOCUSATE SODIUM 8.6-50 MG PO TABS
2.0000 | ORAL_TABLET | Freq: Two times a day (BID) | ORAL | Status: DC
  Administered 2023-10-26: 2 via ORAL
  Filled 2023-10-25 (×3): qty 2

## 2023-10-25 MED ORDER — QUETIAPINE FUMARATE 25 MG PO TABS
12.5000 mg | ORAL_TABLET | Freq: Every day | ORAL | Status: DC
Start: 2023-10-25 — End: 2023-10-26
  Administered 2023-10-25: 12.5 mg via ORAL
  Filled 2023-10-25: qty 1

## 2023-10-25 NOTE — Care Management Obs Status (Signed)
 MEDICARE OBSERVATION STATUS NOTIFICATION   Patient Details  Name: Katelyn Crawford MRN: [id number] Date of Birth: [birth year]   Medicare Observation Status Notification Given:  Yes    Rojelio [name] 10/25/2023, 1:25 PM

## 2023-10-25 NOTE — Evaluation (Signed)
 Occupational Therapy Evaluation Patient Details Name: Katelyn Crawford MRN: [id number] DOB: 1940-05-09 Today's Date: 10/25/2023   History of Present Illness   83 y.o. female with medical history significant of Parkinson's disease, HTN, CKD stage IIIa, CAD status post stenting presented with syncope.     Clinical Impressions Pt was seen for OT evaluation and co-tx with PT for mobility/safety this date. Prior to hospital admission, pt was indep with ADL and IADL, however has not driven recently 2/2 health issues. Pt lives alone, daughter lives next door. Pt presents with deficits in strength, balance, activity tolerance, and orthostatic and mildly symptomatic, affecting safe and optimal ADL completion. Pt currently requires CGA-MIN A for bed mobility, MIN A for STS transfers, and ambulates with CGA + RW + chair follow. Orthostatic vitals taken, please see flowsheets. Pt able to doff/don L sock but endorsed fatigue and symptomatic with the head positional changes so assist provided for R sock. Anticipate MIN A for LBd otherwise. Pt would benefit from skilled OT services to address noted impairments and functional limitations (see below for any additional details) in order to maximize safety and independence while minimizing future risk of falls, injury, and readmission. Anticipate the need for follow up OT services upon acute hospital DC. Hopeful for  possible transition in recommendation should pt continue to improve.     If plan is discharge home, recommend the following:   A little help with walking and/or transfers;A little help with bathing/dressing/bathroom;Assist for transportation;Assistance with cooking/housework;Help with stairs or ramp for entrance     Functional Status Assessment   Patient has had a recent decline in their functional status and demonstrates the ability to make significant improvements in function in a reasonable and predictable amount of time.     Equipment  Recommendations   Other (comment) (defer)     Recommendations for Other Services         Precautions/Restrictions   Precautions Precautions: Fall Precaution/Restrictions Comments: watch orthostatics Restrictions Weight Bearing Restrictions Per Provider Order: No     Mobility Bed Mobility Overal bed mobility: Needs Assistance Bed Mobility: Sit to Supine       Sit to supine: Contact guard assist, Min assist   General bed mobility comments: BLE mgt back to bed    Transfers Overall transfer level: Needs assistance Equipment used: Rolling walker (2 wheels) Transfers: Sit to/from Stand Sit to Stand: Min assist                  Balance Overall balance assessment: Needs assistance Sitting-balance support: Single extremity supported, No upper extremity supported, Feet supported Sitting balance-Leahy Scale: Fair     Standing balance support: Bilateral upper extremity supported Standing balance-Leahy Scale: Fair                             ADL either performed or assessed with clinical judgement   ADL Overall ADL's : Needs assistance/impaired                     Lower Body Dressing: Sitting/lateral leans;Sit to/from stand;Minimal assistance Lower Body Dressing Details (indicate cue type and reason): supv for doffing/donning L sock, assist for R primarily 2/2 pt's fatigue and a bit symptomatic with leaning forward and sitting back up; anticipate MIN A for LB ADL involving STS transfers             Functional mobility during ADLs: Contact guard assist;Rolling walker (2 wheels)  Pertinent Vitals/Pain Pain Assessment Pain Assessment: No/denies pain     Extremity/Trunk Assessment Upper Extremity Assessment Upper Extremity Assessment: Generalized weakness   Lower Extremity Assessment Lower Extremity Assessment: Defer to PT evaluation       Communication Communication Communication: No apparent difficulties   Cognition  Arousal: Alert Behavior During Therapy: WFL for tasks assessed/performed Cognition: No apparent impairments      Following commands: Intact       Cueing  General Comments   Cueing Techniques: Verbal cues  orthostatic vitals taken, see Vitals Flowsheet; pt slightly symptomatic           Home Living Family/patient expects to be discharged to:: Private residence Living Arrangements: Alone Available Help at Discharge: Family Type of Home: House Home Access: Stairs to enter Secretary/administrator of Steps: 4 Entrance Stairs-Rails: Right;Left Home Layout: One level     Bathroom Shower/Tub: Producer, television/film/video: Standard     Home Equipment: Agricultural consultant (2 wheels);Cane - single point;Shower seat          Prior Functioning/Environment Prior Level of Function : Independent/Modified Independent             Mobility Comments: with no AD ADLs Comments: indep, hasn't driven in 14mo    OT Problem List: Decreased strength;Cardiopulmonary status limiting activity;Decreased activity tolerance;Impaired balance (sitting and/or standing);Decreased knowledge of use of DME or AE   OT Treatment/Interventions: Self-care/ADL training;Therapeutic exercise;Therapeutic activities;Energy conservation;DME and/or AE instruction;Patient/family education;Balance training      OT Goals(Current goals can be found in the care plan section)   Acute Rehab OT Goals Patient Stated Goal: go home OT Goal Formulation: With patient/family Time For Goal Achievement: 11/08/23 Potential to Achieve Goals: Good ADL Goals Pt Will Perform Lower Body Dressing: with modified independence;with adaptive equipment;sit to/from stand;with caregiver independent in assisting Pt Will Transfer to Toilet: with modified independence;ambulating (LRAD) Pt Will Perform Toileting - Clothing Manipulation and hygiene: with modified independence;sitting/lateral leans;sit to/from stand Additional ADL Goal  #1: Pt will verbalize plan to implement at least 1 learned falls prevention strategy to optimize safety and falls prevention.   OT Frequency:  Min 2X/week    Co-evaluation PT/OT/SLP Co-Evaluation/Treatment: Yes Reason for Co-Treatment: To address functional/ADL transfers;For patient/therapist safety PT goals addressed during session: Mobility/safety with mobility;Balance;Proper use of DME OT goals addressed during session: ADL's and self-care;Proper use of Adaptive equipment and DME      AM-PAC OT 6 Clicks Daily Activity     Outcome Measure Help from another person eating meals?: None Help from another person taking care of personal grooming?: A Little Help from another person toileting, which includes using toliet, bedpan, or urinal?: A Little Help from another person bathing (including washing, rinsing, drying)?: A Little Help from another person to put on and taking off regular upper body clothing?: None Help from another person to put on and taking off regular lower body clothing?: A Little 6 Click Score: 20   End of Session Equipment Utilized During Treatment: Gait belt;Rolling walker (2 wheels)  Activity Tolerance: Patient tolerated treatment well Patient left: in bed;with call bell/phone within reach;with bed alarm set;with family/visitor present  OT Visit Diagnosis: Other abnormalities of gait and mobility (R26.89);Muscle weakness (generalized) (M62.81)                Time: 8970-8947 OT Time Calculation (min): 23 min Charges:  OT General Charges $OT Visit: 1 Visit OT Evaluation $OT Eval Low Complexity: 1 Low  Warren SAUNDERS., MPH, MS, OTR/L ascom  [phone] 10/25/23, 11:48 AM

## 2023-10-25 NOTE — Plan of Care (Signed)

## 2023-10-25 NOTE — Progress Notes (Addendum)
  Progress Note   Patient: Katelyn Crawford FMW:[id number] DOB: [birth year] DOA: 10/24/2023     0 DOS: the patient was seen and examined on 10/25/2023   Brief hospital course:  BAILA ROUSE is a 83 y.o. female with medical history significant of Parkinson's disease, HTN, CKD stage IIIa, CAD status post stenting presented with syncope.  Per patient daughter, patient blood pressure has been high, but mainly at nighttime.  Blood pressure can go as high as more than 200 at around 3 AM when patient woke up every night. Daytime blood pressures was not significant higher. Patient was found to have significant orthostatic hypotension.  Then blood pressure dropped to 64/48, received 1 L fluid bolus.   Principal Problem:   Syncope Active Problems:   CKD (chronic kidney disease), stage II   Coronary artery disease involving native coronary artery of native heart   Essential hypertension   Hyponatremia   Parkinson's disease (HCC)   Hypotension   Assessment and Plan: Essential hypertension with a severe nocturnal hypertension. 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 Syncope and collapse secondary to hypotension. Severe hypotension. Patient had a significant hypotension this morning, was given 1 L fluid bolus.  Blood pressure is better afterwards. Patient has been having severe nocturnal hypertension, blood pressures were very high normal at 3 AM.  At that time, patient could not sleep. Increased blood pressure could be due to anxiety associated with insomnia.  Added Seroquel at nighttime. Also changed blood pressure medicine to diltiazem 12-hour pill to address the nocturnal hypertension, which will not affect the daytime blood pressure.  Currently daytime pressure is low.  Chronic kidney disease stage II.  Not stage III. Hyponatremia. Reviewed the chart, patient does not have stage III chronic kidney disease, also does not meet acute kidney injury. Mild  hyponatremia will continue to monitor.  Mild thrombocytopenia. Appear to be improving today.  Class I obesity with BMI 32.47. Diet and exercise.    Subjective:  Patient feels better today, no dizziness.  No shortness of breath.  Physical Exam: Vitals:   10/25/23 0745 10/25/23 0748 10/25/23 0752 10/25/23 1011  BP: (!) 99/56 (!) 79/53 (!) 68/44 (!) 118/58  Pulse: 73 82 90 84  Resp: 18 18 18 18   Temp:      TempSrc:      SpO2: 95% 97% 100%   Weight:      Height:       General exam: Appears calm and comfortable, morbidly obese. Respiratory system: Clear to auscultation. Respiratory effort normal. Cardiovascular system: S1 & S2 heard, RRR. No JVD, murmurs, rubs, gallops or clicks. No pedal edema. Gastrointestinal system: Abdomen is nondistended, soft and nontender. No organomegaly or masses felt. Normal bowel sounds heard. Central nervous system: Alert and oriented x3. No focal neurological deficits. Extremities: Symmetric 5 x 5 power. Skin: No rashes, lesions or ulcers Psychiatry: Judgement and insight appear normal. Mood & affect appropriate.    Data Reviewed:  Reviewed CT head results, lab results  Family Communication: Daughter updated at bedside.  Disposition: Status is: Observation      Time spent: 55 minutes  Author: Murvin Mana, MD 10/25/2023 11:47 AM  For on call review [email or web address].

## 2023-10-25 NOTE — Hospital Course (Signed)
 Katelyn Crawford is a 83 y.o. female with medical history significant of Parkinson's disease, HTN, CKD stage IIIa, CAD status post stenting presented with syncope.  Per patient daughter, patient blood pressure has been high, but mainly at nighttime.  Blood pressure can go as high as more than 200 at around 3 AM when patient woke up every night. Daytime blood pressures was not significant higher. Patient was found to have significant orthostatic hypotension.  Then blood pressure dropped to 64/48, received 1 L fluid bolus.

## 2023-10-25 NOTE — Evaluation (Signed)
 Physical Therapy Evaluation Patient Details Name: Katelyn Crawford MRN: [id number] DOB: [birth year] Today's Date: 10/25/2023  History of Present Illness  83 y.o. female with medical history significant of Parkinson's disease, HTN, CKD stage IIIa, CAD status post stenting presented with syncope, unresponsive episode  Clinical Impression  Patient seated edge of bed with OT upon arrival to room; alert and oriented, follows commands and agreeable to participation with session.  BP improved s/p fluid bolus this AM; bilat knee-high TEDs donned throughout session. Patient endorses some improvement in symptoms as the day has progressed.  Does endorse OOB to chair earlier today without dizziness/lightheadedness. Bilat UE/LE strength and ROM generally weak and deconditioned, but grossly WFL for basic transfers and gait; no focal weakness appreciated. No pain reported.  Able to complete bed mobility with min assist; sit/stand, basic transfers and gait (60') with RW, cga/min assist.  Demonstrates reciprocal stepping pattern with fair step height/length; cautious, but steady, cadence without buckling or LOB. Generally fatigued with effort, but denies dizziness/lightheadedness with distance. Unable to tolerate additional distance/activity at this time. Would benefit from skilled PT to address above deficits and promote optimal return to PLOF; recommend post-acute PT follow up as indicated by interdisciplinary care team.          If plan is discharge home, recommend the following: A little help with walking and/or transfers;A little help with bathing/dressing/bathroom   Can travel by private vehicle   Yes    Equipment Recommendations    Recommendations for Other Services       Functional Status Assessment Patient has had a recent decline in their functional status and demonstrates the ability to make significant improvements in function in a reasonable and predictable amount of time.     Precautions /  Restrictions Precautions Precautions: Fall Restrictions Weight Bearing Restrictions Per Provider Order: No      Mobility  Bed Mobility Overal bed mobility: Needs Assistance Bed Mobility: Sit to Supine       Sit to supine: Contact guard assist, Min assist   General bed mobility comments: light assist for LE elevation back into bed    Transfers Overall transfer level: Needs assistance Equipment used: Rolling walker (2 wheels) Transfers: Sit to/from Stand Sit to Stand: Min assist                Ambulation/Gait Ambulation/Gait assistance: Min assist Gait Distance (Feet): 60 Feet Assistive device: Rolling walker (2 wheels)         General Gait Details: reciprocal stepping pattern with fair step height/length; cautious, but steady, cadence without buckling or LOB.  Generally fatigued with effort, but denies dizziness/lightheadedness with distance  Stairs            Wheelchair Mobility     Tilt Bed    Modified Rankin (Stroke Patients Only)       Balance Overall balance assessment: Needs assistance Sitting-balance support: No upper extremity supported, Feet supported Sitting balance-Leahy Scale: Good     Standing balance support: Bilateral upper extremity supported, During functional activity Standing balance-Leahy Scale: Fair                               Pertinent Vitals/Pain Pain Assessment Pain Assessment: No/denies pain    Home Living Family/patient expects to be discharged to:: Private residence Living Arrangements: Alone Available Help at Discharge: Family Type of Home: House Home Access: Stairs to enter Entrance Stairs-Rails: Doctor, general practice of Steps: 4  Home Layout: One level Home Equipment: Agricultural consultant (2 wheels);Cane - single point;Shower seat      Prior Function Prior Level of Function : Independent/Modified Independent             Mobility Comments: Ambulatory without assist device for  household, limited community distances.  Was scheduled to initiate HHPT between previous and current admission; unable to start care due to return hospitalization. ADLs Comments: indep, hasn't driven in 88mo     Extremity/Trunk Assessment   Upper Extremity Assessment Upper Extremity Assessment: Generalized weakness    Lower Extremity Assessment Lower Extremity Assessment: Generalized weakness (grossly at least 4/5 throughout; no focal weakness appreciated)       Communication   Communication Communication: No apparent difficulties    Cognition Arousal: Alert Behavior During Therapy: WFL for tasks assessed/performed   PT - Cognitive impairments: No apparent impairments                         Following commands: Intact       Cueing Cueing Techniques: Verbal cues     General Comments General comments (skin integrity, edema, etc.): orthostatic vitals taken, see Vitals Flowsheet; pt slightly symptomatic    Exercises Other Exercises Other Exercises: Orthostatic assessment-see vitals flowsheet for details   Assessment/Plan    PT Assessment Patient needs continued PT services  PT Problem List Decreased strength;Decreased activity tolerance;Decreased balance;Decreased mobility;Cardiopulmonary status limiting activity       PT Treatment Interventions DME instruction;Gait training;Stair training;Functional mobility training;Therapeutic activities;Therapeutic exercise;Balance training;Patient/family education    PT Goals (Current goals can be found in the Care Plan section)  Acute Rehab PT Goals Patient Stated Goal: to rest and get stronger PT Goal Formulation: With patient Time For Goal Achievement: 11/08/23 Potential to Achieve Goals: Good    Frequency Min 2X/week     Co-evaluation   Reason for Co-Treatment: To address functional/ADL transfers;For patient/therapist safety PT goals addressed during session: Mobility/safety with mobility;Balance;Proper use  of DME OT goals addressed during session: ADL's and self-care;Proper use of Adaptive equipment and DME       AM-PAC PT 6 Clicks Mobility  Outcome Measure Help needed turning from your back to your side while in a flat bed without using bedrails?: None Help needed moving from lying on your back to sitting on the side of a flat bed without using bedrails?: A Little Help needed moving to and from a bed to a chair (including a wheelchair)?: A Little Help needed standing up from a chair using your arms (e.g., wheelchair or bedside chair)?: A Little Help needed to walk in hospital room?: A Little Help needed climbing 3-5 steps with a railing? : A Little 6 Click Score: 19    End of Session   Activity Tolerance: Patient tolerated treatment well Patient left: in bed;with call bell/phone within reach;with bed alarm set;with family/visitor present Nurse Communication: Mobility status PT Visit Diagnosis: Muscle weakness (generalized) (M62.81);Difficulty in walking, not elsewhere classified (R26.2)    Time: 8975-8945 PT Time Calculation (min) (ACUTE ONLY): 30 min   Charges:   PT Evaluation $PT Eval Moderate Complexity: 1 Mod   PT General Charges $$ ACUTE PT VISIT: 1 Visit         Sharisa Toves H. Delores, PT, DPT, NCS 10/25/23, 1:59 PM [phone]

## 2023-10-26 DIAGNOSIS — G20A1 Parkinson's disease without dyskinesia, without mention of fluctuations: Secondary | ICD-10-CM | POA: Diagnosis not present

## 2023-10-26 DIAGNOSIS — I9589 Other hypotension: Secondary | ICD-10-CM | POA: Diagnosis not present

## 2023-10-26 DIAGNOSIS — R55 Syncope and collapse: Secondary | ICD-10-CM | POA: Diagnosis not present

## 2023-10-26 LAB — BASIC METABOLIC PANEL WITH GFR
Anion gap: 7 (ref 5–15)
BUN: 19 mg/dL (ref 8–23)
CO2: 25 mmol/L (ref 22–32)
Calcium: 8.6 mg/dL — ABNORMAL LOW (ref 8.9–10.3)
Chloride: 102 mmol/L (ref 98–111)
Creatinine, Ser: 0.87 mg/dL (ref 0.44–1.00)
GFR, Estimated: >60 mL/min (ref >60)
Glucose, Bld: 94 mg/dL (ref 70–99)
Potassium: 4.1 mmol/L (ref 3.5–5.1)
Sodium: 134 mmol/L — ABNORMAL LOW (ref 135–145)

## 2023-10-26 LAB — GLUCOSE, CAPILLARY: Glucose-Capillary: 103 mg/dL — ABNORMAL HIGH (ref 70–99)

## 2023-10-26 LAB — CORTISOL-AM, BLOOD: Cortisol - AM: 6.6 ug/dL — ABNORMAL LOW (ref 6.7–22.6)

## 2023-10-26 MED ORDER — DILTIAZEM HCL ER 60 MG PO CP12: 60.0000 mg | ORAL_CAPSULE | Freq: Every day | ORAL | 0 refills | Status: DC

## 2023-10-26 MED ORDER — QUETIAPINE FUMARATE 25 MG PO TABS: 12.5000 mg | ORAL_TABLET | Freq: Every day | ORAL | 0 refills | Status: AC

## 2023-10-26 MED ORDER — DILTIAZEM HCL ER 60 MG PO CP12: 60.0000 mg | ORAL_CAPSULE | Freq: Every day | ORAL | Status: DC

## 2023-10-26 MED ORDER — DILTIAZEM HCL ER 60 MG PO CP12: 60.0000 mg | ORAL_CAPSULE | Freq: Every day | ORAL | 0 refills | Status: AC

## 2023-10-26 MED ORDER — QUETIAPINE FUMARATE 25 MG PO TABS: 12.5000 mg | ORAL_TABLET | Freq: Every day | ORAL | 0 refills | Status: DC

## 2023-10-26 NOTE — Plan of Care (Signed)

## 2023-10-26 NOTE — Plan of Care (Signed)
  Problem: Education: Goal: Knowledge of condition and prescribed therapy will improve 10/26/2023 1206 by Cadyn Rodger N, RN Outcome: Completed/Met 10/26/2023 1058 by Kenard Marleen SAILOR, RN Outcome: Progressing   Problem: Cardiac: Goal: Will achieve and/or maintain adequate cardiac output 10/26/2023 1206 by Kenard Marleen SAILOR, RN Outcome: Completed/Met 10/26/2023 1058 by Kenard Marleen SAILOR, RN Outcome: Progressing   Problem: Physical Regulation: Goal: Complications related to the disease process, condition or treatment will be avoided or minimized 10/26/2023 1206 by Hosteen Kienast N, RN Outcome: Completed/Met 10/26/2023 1058 by Kenard Marleen SAILOR, RN Outcome: Progressing   Problem: Education: Goal: Knowledge of General Education information will improve Description: Including pain rating scale, medication(s)/side effects and non-pharmacologic comfort measures 10/26/2023 1206 by Wilbon Obenchain N, RN Outcome: Completed/Met 10/26/2023 1058 by Kenard Marleen SAILOR, RN Outcome: Progressing   Problem: Health Behavior/Discharge Planning: Goal: Ability to manage health-related needs will improve 10/26/2023 1206 by Kenard Marleen SAILOR, RN Outcome: Completed/Met 10/26/2023 1058 by Kenard Marleen SAILOR, RN Outcome: Progressing   Problem: Clinical Measurements: Goal: Ability to maintain clinical measurements within normal limits will improve 10/26/2023 1206 by Kenard Marleen SAILOR, RN Outcome: Completed/Met 10/26/2023 1058 by Kenard Marleen SAILOR, RN Outcome: Progressing Goal: Will remain free from infection 10/26/2023 1206 by Kenard Marleen SAILOR, RN Outcome: Completed/Met 10/26/2023 1058 by Kenard Marleen SAILOR, RN Outcome: Progressing Goal: Diagnostic test results will improve 10/26/2023 1206 by Kenard Marleen SAILOR, RN Outcome: Completed/Met 10/26/2023 1058 by Kenard Marleen SAILOR, RN Outcome: Progressing Goal: Respiratory complications will improve 10/26/2023 1206 by Kenard Marleen SAILOR, RN Outcome: Completed/Met 10/26/2023 1058  by Kenard Marleen SAILOR, RN Outcome: Progressing Goal: Cardiovascular complication will be avoided 10/26/2023 1206 by Kenard Marleen SAILOR, RN Outcome: Completed/Met 10/26/2023 1058 by Kenard Marleen SAILOR, RN Outcome: Progressing   Problem: Activity: Goal: Risk for activity intolerance will decrease 10/26/2023 1206 by Kenard Marleen SAILOR, RN Outcome: Completed/Met 10/26/2023 1058 by Kenard Marleen SAILOR, RN Outcome: Progressing   Problem: Nutrition: Goal: Adequate nutrition will be maintained 10/26/2023 1206 by Kenard Marleen SAILOR, RN Outcome: Completed/Met 10/26/2023 1058 by Kenard Marleen SAILOR, RN Outcome: Progressing   Problem: Coping: Goal: Level of anxiety will decrease 10/26/2023 1206 by Kenard Marleen SAILOR, RN Outcome: Completed/Met 10/26/2023 1058 by Kenard Marleen SAILOR, RN Outcome: Progressing   Problem: Elimination: Goal: Will not experience complications related to bowel motility 10/26/2023 1206 by Kenard Marleen SAILOR, RN Outcome: Completed/Met 10/26/2023 1058 by Kenard Marleen SAILOR, RN Outcome: Progressing Goal: Will not experience complications related to urinary retention 10/26/2023 1206 by Kenard Marleen SAILOR, RN Outcome: Completed/Met 10/26/2023 1058 by Kenard Marleen SAILOR, RN Outcome: Progressing   Problem: Pain Managment: Goal: General experience of comfort will improve and/or be controlled 10/26/2023 1206 by Kenard Marleen SAILOR, RN Outcome: Completed/Met 10/26/2023 1058 by Kenard Marleen SAILOR, RN Outcome: Progressing   Problem: Safety: Goal: Ability to remain free from injury will improve 10/26/2023 1206 by Kenard Marleen SAILOR, RN Outcome: Completed/Met 10/26/2023 1058 by Kenard Marleen SAILOR, RN Outcome: Progressing   Problem: Skin Integrity: Goal: Risk for impaired skin integrity will decrease 10/26/2023 1206 by Kenard Marleen SAILOR, RN Outcome: Completed/Met 10/26/2023 1058 by Kenard Marleen SAILOR, RN Outcome: Progressing

## 2023-10-26 NOTE — Progress Notes (Signed)
 Patient discharged to home with all belongings and accompanied by daughter, Jon, who will drive home. A+Ox4. No complaints of pain at this time. VSS. Discharge instructions and medications reviewed. All questions answered. PIV x 1 removed, no bleeding, intact. Patient verbalized understanding of signs and symptoms of infection. Patient agreed to follow up with all appointments as listed on AVS. Patient satisfied with overall care at [facility].

## 2023-10-26 NOTE — Discharge Summary (Signed)
 Physician Discharge Summary   Patient: Katelyn Crawford MRN: [id number] DOB: 1940-09-30  Admit date:     10/24/2023  Discharge date: 10/26/23  Discharge Physician: Murvin Mana   PCP: Auston Reyes BIRCH, MD   Recommendations at discharge:   Follow-up with PCP in 1 week.  Discharge Diagnoses: Principal Problem:   Syncope Active Problems:   CKD (chronic kidney disease), stage II   Coronary artery disease involving native coronary artery of native heart   Essential hypertension   Hyponatremia   Parkinson's disease (HCC)   Hypotension   Class 1 obesity  Resolved Problems:   * No resolved hospital problems. *  Hospital Course:  Katelyn Crawford is a 83 y.o. female with medical history significant of Parkinson's disease, HTN, CKD stage IIIa, CAD status post stenting presented with syncope.  Per patient daughter, patient blood pressure has been high, but mainly at nighttime.  Blood pressure can go as high as more than 200 at around 3 AM when patient woke up every night. Daytime blood pressures was not significant higher. Patient was found to have significant orthostatic hypotension.  Then blood pressure dropped to 64/48, received 1 L fluid bolus. Patient condition has improved, blood pressure medicine adjusted as below.  Assessment and Plan: Essential hypertension with a severe nocturnal hypertension. 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 Syncope and collapse secondary to hypotension. Severe hypotension. Patient had a significant hypotension 10/4 morning, was given 1 L fluid bolus.  Blood pressure is better afterwards. Patient has been having severe nocturnal hypertension, blood pressures were very high normal at 3 AM.  At that time, patient could not sleep. Increased blood pressure could be due to anxiety associated with insomnia.  Added Seroquel at nighttime. Also changed blood pressure medicine to diltiazem 12-hour pill to address the nocturnal  hypertension, which will not affect the daytime blood pressure.  Currently daytime pressure is not significant elevated.  Due to orthostatic hypotension, daytime blood pressure medicines are discontinued.   Chronic kidney disease stage II.  Not stage III. Hyponatremia. Reviewed the chart, patient does not have stage III chronic kidney disease, also does not meet acute kidney injury. Patient has mild hyponatremia which has improved.  Cortisol level was 6.6, borderline low, patient does not have adrenal crisis.  This can be followed at outpatient, may consider perform a ACTH stimulation test if indicated.   Mild thrombocytopenia. Appear to be improving.   Class I obesity with BMI 32.47. Diet and exercise.         Consultants: None Procedures performed: None  Disposition: Home Diet recommendation:  Discharge Diet Orders (From admission, onward)     Start     Ordered   10/26/23 0000  Diet - low sodium heart healthy        10/26/23 0948           Cardiac diet DISCHARGE MEDICATION: Allergies as of 10/26/2023       Reactions   Penicillins    Has patient had a PCN reaction causing immediate rash, facial/tongue/throat swelling, SOB or lightheadedness with hypotension: no Has patient had a PCN reaction causing severe rash involving mucus membranes or skin necrosis: no Has patient had a PCN reaction that required hospitalization: no Has patient had a PCN reaction occurring within the last 10 years: no If all of the above answers are NO, then may proceed with Cephalosporin use.   Sulfa Antibiotics    UNKNOWN        Medication  List     STOP taking these medications    captopril 12.5 MG tablet Commonly known as: CAPOTEN   hydrALAZINE  25 MG tablet Commonly known as: APRESOLINE        TAKE these medications    acetaminophen  500 MG tablet Commonly known as: TYLENOL  Take 500 mg by mouth every 8 (eight) hours as needed.   ascorbic acid  500 MG tablet Commonly  known as: VITAMIN C  Take 500 mg by mouth daily.   aspirin  EC 81 MG tablet Take 81 mg by mouth daily.   B COMPLEX VITAMINS PO Take 1 tablet by mouth daily.   calcium  carbonate 1250 (500 Ca) MG tablet Commonly known as: OS-CAL - dosed in mg of elemental calcium  Take 625 mg by mouth 2 (two) times daily with a meal.   carbidopa -levodopa  25-250 MG tablet Commonly known as: SINEMET  IR Take 1 tablet by mouth 4 (four) times daily. 0700, 1100, 1500, and 1900   carbidopa -levodopa  50-200 MG tablet Commonly known as: SINEMET  CR Take 1 tablet by mouth at bedtime.   cetirizine 10 MG tablet Commonly known as: ZYRTEC Take 10 mg by mouth at bedtime.   diltiazem 60 MG 12 hr capsule Commonly known as: CARDIZEM SR Take 1 capsule (60 mg total) by mouth at bedtime.   fluticasone  50 MCG/ACT nasal spray Commonly known as: FLONASE  Place 2 sprays into both nostrils daily. What changed:  when to take this reasons to take this   gabapentin  300 MG capsule Commonly known as: NEURONTIN  Take 300 mg by mouth at bedtime.   gabapentin  100 MG capsule Commonly known as: NEURONTIN  Take 100 mg by mouth 2 (two) times daily as needed (pain).   levothyroxine  25 MCG tablet Commonly known as: SYNTHROID  Take 1 tablet (25 mcg total) by mouth daily at 6 (six) AM.   magnesium  sulfate 500 MG/ML Soln oral syringe Take 250 mg by mouth daily.   Multivitamin Adult Tabs Take 1 tablet by mouth 2 (two) times daily.   nitroGLYCERIN 0.4 MG SL tablet Commonly known as: NITROSTAT Place 0.4 mg under the tongue every 5 (five) minutes as needed for chest pain.   pantoprazole  40 MG tablet Commonly known as: PROTONIX  Take 40 mg by mouth 2 (two) times daily before a meal.   QUEtiapine 25 MG tablet Commonly known as: SEROQUEL Take 0.5 tablets (12.5 mg total) by mouth at bedtime.   rosuvastatin  20 MG tablet Commonly known as: CRESTOR  Take 20 mg by mouth at bedtime.   sertraline  50 MG tablet Commonly known as:  ZOLOFT  Take 25 mg by mouth at bedtime.   SUMAtriptan  50 MG tablet Commonly known as: Imitrex  Take 1 tablet (50 mg total) by mouth once as needed for migraine. May repeat in 2 hours if headache persists or recurs. Do not exceed 100mg  in a day.   Vitamin D -3 25 MCG (1000 UT) Caps Take 1,000 Int'l Units by mouth daily.        Follow-up Information     Auston Reyes BIRCH, MD Follow up in 1 week(s).   Specialty: Internal Medicine Contact information: [address] [place] [address] [phone]                Discharge Exam: Fredricka Weights   10/24/23 1207 10/25/23 0500 10/26/23 0500  Weight: 64.6 kg 65.7 kg 69.3 kg   General exam: Appears calm and comfortable  Respiratory system: Clear to auscultation. Respiratory effort normal. Cardiovascular system: S1 & S2 heard, RRR. No JVD, murmurs, rubs, gallops  or clicks. No pedal edema. Gastrointestinal system: Abdomen is nondistended, soft and nontender. No organomegaly or masses felt. Normal bowel sounds heard. Central nervous system: Alert and oriented. No focal neurological deficits. Extremities: Symmetric 5 x 5 power. Skin: No rashes, lesions or ulcers Psychiatry: Judgement and insight appear normal. Mood & affect appropriate.    Condition at discharge: good  The results of significant diagnostics from this hospitalization (including imaging, microbiology, ancillary and laboratory) are listed below for reference.   Imaging Studies: CT HEAD WO CONTRAST ( ) Result Date: 10/24/2023 CLINICAL DATA:  Possible syncope.  Headache.  Altered mental status. EXAM: CT HEAD WITHOUT CONTRAST TECHNIQUE: Contiguous axial images were obtained from the base of the skull through the vertex without intravenous contrast. RADIATION DOSE REDUCTION: This exam was performed according to the departmental dose-optimization program which includes automated exposure control, adjustment of the mA and/or kV according to patient size and/or use of  iterative reconstruction technique. COMPARISON:  10/07/2023 FINDINGS: Brain: Ventricles, cisterns and other CSF spaces are normal. There is minimal chronic ischemic microvascular disease present. There is no mass, mass effect, shift of midline structures or acute hemorrhage. 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 Vascular: No hyperdense vessel or unexpected calcification. Skull: Normal. Negative for fracture or focal lesion. Sinuses/Orbits: Orbits are normal. Paranasal sinuses are well aerated and clear. Other: IMPRESSION: 1. No acute findings. 2. Minimal chronic ischemic microvascular disease. 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 Electronically Signed   By: Toribio Agreste M.D.   On: 10/24/2023 09:16   DG Chest Port 1 View Result Date: 10/07/2023 EXAM: 1 VIEW XRAY OF THE CHEST 10/07/2023 01:47:10 PM COMPARISON: 08/31/2023 CLINICAL HISTORY: Cough 10031. cough FINDINGS: LUNGS AND PLEURA: Low lung volumes. Left basilar opacity. No pulmonary edema. No pleural effusion. No pneumothorax. HEART AND MEDIASTINUM: No acute abnormality of the cardiac and mediastinal silhouettes. Atherosclerotic calcifications. BONES AND SOFT TISSUES: No acute osseous abnormality. IMPRESSION: 1. Left basilar opacity, possibly related to low lung volumes. Electronically signed by: Norman Gatlin MD 10/07/2023 01:52 PM EDT RP Workstation: [id number]   CT Head Wo Contrast Result Date: 10/07/2023 EXAM: CT HEAD WITHOUT CONTRAST 10/07/2023 10:39:31 AM TECHNIQUE: CT of the head was performed without the administration of intravenous contrast. Automated exposure control, iterative reconstruction, and/or weight based adjustment of the mA/kV was utilized to reduce the radiation dose to as low as reasonably achievable. COMPARISON: 09/15/2019 CLINICAL HISTORY: Headache, increasing frequency or severity. BIB [facility] for HTN. Reports was here on the 9th for the same but BP is not getting better and even took extra BP meds. Patient complains of  headache and upper back burning. EMS got 230/90. FINDINGS: BRAIN AND VENTRICLES: No acute hemorrhage. No evidence of acute infarct. No hydrocephalus. No extra-axial collection. No mass effect or midline shift. Nonspecific hypoattenuation in the periventricular and subcortical white matter, most likely representing chronic microvascular ischemic changes. 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 ORBITS: Bilateral lens replacement. SINUSES: No acute abnormality. SOFT TISSUES AND SKULL: No acute soft tissue abnormality. No skull fracture. IMPRESSION: 1. No acute intracranial abnormality. 2. Mild chronic microvascular ischemic changes. 3. 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 Electronically signed by: Donnice Mania MD 10/07/2023 11:07 AM EDT RP Workstation: [id number]    Microbiology: Results for orders placed or performed during the hospital encounter of 10/07/23  Resp panel by RT-PCR (RSV, Flu A&B, Covid) Anterior Nasal Swab     Status: None   Collection Time: 10/07/23 11:47 AM   Specimen: Anterior Nasal Swab  Result  Value Ref Range Status   SARS Coronavirus 2 by RT PCR NEGATIVE NEGATIVE Final    Comment: (NOTE) SARS-CoV-2 target nucleic acids are NOT DETECTED.  The SARS-CoV-2 RNA is generally detectable in upper respiratory specimens during the acute phase of infection. The lowest concentration of SARS-CoV-2 viral copies this assay can detect is 138 copies/mL. A negative result does not preclude SARS-Cov-2 infection and should not be used as the sole basis for treatment or other patient management decisions. A negative result may occur with  improper specimen collection/handling, submission of specimen other than nasopharyngeal swab, presence of viral mutation(s) within the areas targeted by this assay, and inadequate number of viral copies(<138 copies/mL). A negative result must be combined with clinical observations, patient history, and epidemiological information. The expected  result is Negative.  Fact Sheet for Patients:  BloggerCourse.com  Fact Sheet for Healthcare Providers:  SeriousBroker.it  This test is no t yet approved or cleared by the [place]  FDA and  has been authorized for detection and/or diagnosis of SARS-CoV-2 by FDA under an Emergency Use Authorization (EUA). This EUA will remain  in effect (meaning this test can be used) for the duration of the COVID-19 declaration under Section 564(b)(1) of the Act, 21 U.S.C.section 360bbb-3(b)(1), unless the authorization is terminated  or revoked sooner.       Influenza A by PCR NEGATIVE NEGATIVE Final   Influenza B by PCR NEGATIVE NEGATIVE Final    Comment: (NOTE) The Xpert Xpress SARS-CoV-2/FLU/RSV plus assay is intended as an aid in the diagnosis of influenza from Nasopharyngeal swab specimens and should not be used as a sole basis for treatment. Nasal washings and aspirates are unacceptable for Xpert Xpress SARS-CoV-2/FLU/RSV testing.  Fact Sheet for Patients: BloggerCourse.com  Fact Sheet for Healthcare Providers: SeriousBroker.it  This test is not yet approved or cleared by the [place]  FDA and has been authorized for detection and/or diagnosis of SARS-CoV-2 by FDA under an Emergency Use Authorization (EUA). This EUA will remain in effect (meaning this test can be used) for the duration of the COVID-19 declaration under Section 564(b)(1) of the Act, 21 U.S.C. section 360bbb-3(b)(1), unless the authorization is terminated or revoked.     Resp Syncytial Virus by PCR NEGATIVE NEGATIVE Final    Comment: (NOTE) Fact Sheet for Patients: BloggerCourse.com  Fact Sheet for Healthcare Providers: SeriousBroker.it  This test is not yet approved or cleared by the [place]  FDA and has been authorized for detection and/or diagnosis of  SARS-CoV-2 by FDA under an Emergency Use Authorization (EUA). This EUA will remain in effect (meaning this test can be used) for the duration of the COVID-19 declaration under Section 564(b)(1) of the Act, 21 U.S.C. section 360bbb-3(b)(1), unless the authorization is terminated or revoked.  Performed at [facility], [address] [place], [place] [address]     Labs: CBC: Recent Labs  Lab 10/24/23 0910  WBC 5.7  NEUTROABS 3.2  HGB 12.0  HCT 35.6*  MCV 89.2  PLT 148*   Basic Metabolic Panel: Recent Labs  Lab 10/24/23 0832 10/26/23 0401  NA 131* 134*  K 4.2 4.1  CL 94* 102  CO2 25 25  GLUCOSE 114* 94  BUN 17 19  CREATININE 0.89 0.87  CALCIUM  9.5 8.6*   Liver Function Tests: Recent Labs  Lab 10/24/23 0832  AST 35  ALT <5  ALKPHOS 56  BILITOT 0.7  PROT 7.1  ALBUMIN 3.9   CBG: Recent Labs  Lab 10/24/23 0828 10/25/23 0458  10/26/23 0459  GLUCAP 93 88 103*    Discharge time spent: 35 minutes.  Signed: Murvin Mana, MD [facility] 10/26/2023

## 2023-10-26 NOTE — TOC CAGE-AID Note (Signed)
 Transition of Care [facility]) - CAGE-AID Screening   Patient Details  Name: Katelyn Crawford MRN: [id number] Date of Birth: [birth year]  Transition of Care [facility]) CM/SW Contact:    Edsel DELENA Fischer, LCSW Phone Number: 10/26/2023, 11:38 AM   Clinical Narrative:  TOC spoke with daughter.  Daughter expressed questions about HH in the even pt needs it.  TOC educated daughter on the difference between [facility] and Homecare and that both require doctors order and when one or the other is appropriate.  CAGE-AID Screening:

## 2023-10-27 ENCOUNTER — Ambulatory Visit: Admitting: Physical Therapy

## 2023-10-27 ENCOUNTER — Encounter

## 2023-10-29 ENCOUNTER — Ambulatory Visit: Admitting: Physical Therapy

## 2023-10-29 ENCOUNTER — Encounter

## 2023-11-03 ENCOUNTER — Ambulatory Visit: Admitting: Physical Therapy

## 2023-11-03 ENCOUNTER — Encounter

## 2023-11-05 ENCOUNTER — Ambulatory Visit: Admitting: Physical Therapy

## 2023-11-05 ENCOUNTER — Encounter

## 2023-11-10 ENCOUNTER — Ambulatory Visit: Admitting: Physical Therapy

## 2023-11-10 ENCOUNTER — Encounter

## 2023-11-12 ENCOUNTER — Encounter

## 2023-11-12 ENCOUNTER — Ambulatory Visit: Admitting: Physical Therapy

## 2023-11-13 ENCOUNTER — Ambulatory Visit
Admission: RE | Admit: 2023-11-13 | Discharge: 2023-11-13 | Disposition: A | Discharge status: Home / Self Care | Source: Ambulatory Visit | Attending: Internal Medicine | Admitting: Internal Medicine

## 2023-11-13 DIAGNOSIS — Z1231 Encounter for screening mammogram for malignant neoplasm of breast: Secondary | ICD-10-CM | POA: Diagnosis present

## 2023-11-15 LAB — ECHOCARDIOGRAM COMPLETE
AR max vel: 2.67 cm2
AV Area VTI: 2.67 cm2
AV Area mean vel: 2.37 cm2
AV Mean grad: 3.5 mmHg
AV Peak grad: 5.5 mmHg
Ao pk vel: 1.17 m/s
Area-P 1/2: 3.4 cm2
Height: 56 in
MV VTI: 2.68 cm2
S' Lateral: 2.4 cm
Weight: 2280 [oz_av]

## 2024-02-12 ENCOUNTER — Other Ambulatory Visit: Payer: Self-pay | Admitting: Sports Medicine

## 2024-02-12 DIAGNOSIS — M7121 Synovial cyst of popliteal space [Baker], right knee: Secondary | ICD-10-CM

## 2024-02-12 DIAGNOSIS — M7631 Iliotibial band syndrome, right leg: Secondary | ICD-10-CM

## 2024-02-12 DIAGNOSIS — M1711 Unilateral primary osteoarthritis, right knee: Secondary | ICD-10-CM

## 2024-02-12 DIAGNOSIS — G8929 Other chronic pain: Secondary | ICD-10-CM

## 2024-02-12 DIAGNOSIS — M25461 Effusion, right knee: Secondary | ICD-10-CM

## 2024-02-19 ENCOUNTER — Ambulatory Visit
Admission: RE | Admit: 2024-02-19 | Discharge: 2024-02-19 | Disposition: A | Discharge status: Home / Self Care | Source: Ambulatory Visit | Attending: Sports Medicine | Admitting: Sports Medicine

## 2024-02-19 DIAGNOSIS — M7631 Iliotibial band syndrome, right leg: Secondary | ICD-10-CM | POA: Insufficient documentation

## 2024-02-19 DIAGNOSIS — M7121 Synovial cyst of popliteal space [Baker], right knee: Secondary | ICD-10-CM | POA: Insufficient documentation

## 2024-02-19 DIAGNOSIS — M1711 Unilateral primary osteoarthritis, right knee: Secondary | ICD-10-CM | POA: Diagnosis present

## 2024-02-19 DIAGNOSIS — M25561 Pain in right knee: Secondary | ICD-10-CM | POA: Diagnosis present

## 2024-02-19 DIAGNOSIS — G8929 Other chronic pain: Secondary | ICD-10-CM | POA: Insufficient documentation

## 2024-02-19 DIAGNOSIS — M25461 Effusion, right knee: Secondary | ICD-10-CM | POA: Insufficient documentation
